# Patient Record
Sex: Female | Born: 1940 | Race: White | Hispanic: No | Smoking: Former smoker
Health system: Southern US, Community
[De-identification: ages and names within clinical notes are randomized; demographics above are authoritative.]

## PROBLEM LIST (undated history)

## (undated) DIAGNOSIS — M2540 Effusion, unspecified joint: Secondary | ICD-10-CM

## (undated) DIAGNOSIS — Z860100 Personal history of colon polyps, unspecified: Secondary | ICD-10-CM

## (undated) DIAGNOSIS — K219 Gastro-esophageal reflux disease without esophagitis: Secondary | ICD-10-CM

## (undated) DIAGNOSIS — E7800 Pure hypercholesterolemia, unspecified: Secondary | ICD-10-CM

## (undated) DIAGNOSIS — Z8669 Personal history of other diseases of the nervous system and sense organs: Secondary | ICD-10-CM

## (undated) DIAGNOSIS — E119 Type 2 diabetes mellitus without complications: Secondary | ICD-10-CM

## (undated) DIAGNOSIS — J189 Pneumonia, unspecified organism: Secondary | ICD-10-CM

## (undated) DIAGNOSIS — I10 Essential (primary) hypertension: Secondary | ICD-10-CM

## (undated) DIAGNOSIS — H269 Unspecified cataract: Secondary | ICD-10-CM

## (undated) DIAGNOSIS — M549 Dorsalgia, unspecified: Secondary | ICD-10-CM

## (undated) DIAGNOSIS — M2550 Pain in unspecified joint: Secondary | ICD-10-CM

## (undated) DIAGNOSIS — M1710 Unilateral primary osteoarthritis, unspecified knee: Secondary | ICD-10-CM

## (undated) DIAGNOSIS — K589 Irritable bowel syndrome without diarrhea: Secondary | ICD-10-CM

## (undated) HISTORY — PX: BREAST EXCISIONAL BIOPSY: SUR124

## (undated) HISTORY — PX: ABDOMINAL HYSTERECTOMY: SHX81

## (undated) HISTORY — DX: Irritable bowel syndrome without diarrhea: K58.9

## (undated) HISTORY — PX: RECTAL SURGERY: SHX760

## (undated) HISTORY — DX: Unilateral primary osteoarthritis, unspecified knee: M17.10

## (undated) HISTORY — PX: REPLACEMENT TOTAL KNEE BILATERAL: SUR1225

## (undated) HISTORY — DX: Gastro-esophageal reflux disease without esophagitis: K21.9

---

## 2000-11-25 ENCOUNTER — Encounter (INDEPENDENT_AMBULATORY_CARE_PROVIDER_SITE_OTHER): Payer: Self-pay | Admitting: Specialist

## 2000-11-25 ENCOUNTER — Ambulatory Visit (HOSPITAL_COMMUNITY): Admission: RE | Admit: 2000-11-25 | Discharge: 2000-11-25 | Payer: Self-pay | Admitting: Gastroenterology

## 2002-11-16 ENCOUNTER — Other Ambulatory Visit: Admission: RE | Admit: 2002-11-16 | Discharge: 2002-11-16 | Payer: Self-pay | Admitting: Family Medicine

## 2003-03-21 ENCOUNTER — Encounter: Admission: RE | Admit: 2003-03-21 | Discharge: 2003-03-21 | Payer: Self-pay | Admitting: Gastroenterology

## 2003-03-21 ENCOUNTER — Encounter: Payer: Self-pay | Admitting: Gastroenterology

## 2003-05-30 ENCOUNTER — Encounter: Payer: Self-pay | Admitting: Family Medicine

## 2003-05-30 ENCOUNTER — Encounter: Admission: RE | Admit: 2003-05-30 | Discharge: 2003-05-30 | Payer: Self-pay | Admitting: Family Medicine

## 2003-06-07 ENCOUNTER — Encounter: Payer: Self-pay | Admitting: Family Medicine

## 2003-06-07 ENCOUNTER — Encounter: Admission: RE | Admit: 2003-06-07 | Discharge: 2003-06-07 | Payer: Self-pay | Admitting: Family Medicine

## 2003-12-19 ENCOUNTER — Encounter: Admission: RE | Admit: 2003-12-19 | Discharge: 2003-12-19 | Payer: Self-pay | Admitting: Family Medicine

## 2004-02-13 ENCOUNTER — Encounter (INDEPENDENT_AMBULATORY_CARE_PROVIDER_SITE_OTHER): Payer: Self-pay | Admitting: *Deleted

## 2004-02-13 ENCOUNTER — Ambulatory Visit (HOSPITAL_COMMUNITY): Admission: RE | Admit: 2004-02-13 | Discharge: 2004-02-13 | Payer: Self-pay | Admitting: Gastroenterology

## 2004-06-25 ENCOUNTER — Encounter: Admission: RE | Admit: 2004-06-25 | Discharge: 2004-06-25 | Payer: Self-pay | Admitting: Family Medicine

## 2004-10-19 ENCOUNTER — Emergency Department (HOSPITAL_COMMUNITY): Admission: EM | Admit: 2004-10-19 | Discharge: 2004-10-20 | Payer: Self-pay | Admitting: Emergency Medicine

## 2005-09-14 ENCOUNTER — Encounter: Admission: RE | Admit: 2005-09-14 | Discharge: 2005-09-14 | Payer: Self-pay | Admitting: Orthopaedic Surgery

## 2005-11-02 ENCOUNTER — Ambulatory Visit (HOSPITAL_BASED_OUTPATIENT_CLINIC_OR_DEPARTMENT_OTHER): Admission: RE | Admit: 2005-11-02 | Discharge: 2005-11-02 | Payer: Self-pay | Admitting: Orthopaedic Surgery

## 2006-02-04 ENCOUNTER — Encounter: Admission: RE | Admit: 2006-02-04 | Discharge: 2006-02-04 | Payer: Self-pay | Admitting: Family Medicine

## 2007-01-13 ENCOUNTER — Encounter: Admission: RE | Admit: 2007-01-13 | Discharge: 2007-01-13 | Payer: Self-pay | Admitting: Gastroenterology

## 2007-02-09 ENCOUNTER — Encounter: Admission: RE | Admit: 2007-02-09 | Discharge: 2007-02-09 | Payer: Self-pay | Admitting: Family Medicine

## 2008-02-13 ENCOUNTER — Inpatient Hospital Stay (HOSPITAL_COMMUNITY): Admission: RE | Admit: 2008-02-13 | Discharge: 2008-02-16 | Payer: Self-pay | Admitting: Orthopaedic Surgery

## 2008-04-23 ENCOUNTER — Ambulatory Visit (HOSPITAL_COMMUNITY): Admission: RE | Admit: 2008-04-23 | Discharge: 2008-04-23 | Payer: Self-pay | Admitting: General Surgery

## 2008-08-22 ENCOUNTER — Inpatient Hospital Stay (HOSPITAL_COMMUNITY): Admission: RE | Admit: 2008-08-22 | Discharge: 2008-08-25 | Payer: Self-pay | Admitting: Orthopaedic Surgery

## 2008-09-24 ENCOUNTER — Ambulatory Visit (HOSPITAL_COMMUNITY): Admission: RE | Admit: 2008-09-24 | Discharge: 2008-09-24 | Payer: Self-pay | Admitting: General Surgery

## 2008-09-24 ENCOUNTER — Encounter (INDEPENDENT_AMBULATORY_CARE_PROVIDER_SITE_OTHER): Payer: Self-pay | Admitting: General Surgery

## 2009-02-12 ENCOUNTER — Encounter: Admission: RE | Admit: 2009-02-12 | Discharge: 2009-02-12 | Payer: Self-pay | Admitting: Family Medicine

## 2010-02-16 ENCOUNTER — Encounter: Admission: RE | Admit: 2010-02-16 | Discharge: 2010-02-16 | Payer: Self-pay | Admitting: Family Medicine

## 2010-10-31 ENCOUNTER — Other Ambulatory Visit: Payer: Self-pay | Admitting: Family Medicine

## 2010-10-31 DIAGNOSIS — Z1231 Encounter for screening mammogram for malignant neoplasm of breast: Secondary | ICD-10-CM

## 2010-10-31 DIAGNOSIS — Z0000 Encounter for general adult medical examination without abnormal findings: Secondary | ICD-10-CM

## 2010-11-02 ENCOUNTER — Encounter: Payer: Self-pay | Admitting: Family Medicine

## 2010-12-07 DIAGNOSIS — K649 Unspecified hemorrhoids: Secondary | ICD-10-CM | POA: Insufficient documentation
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
## 2011-02-23 NOTE — Op Note (Signed)
Andrea Farmer, Andrea Farmer                  ACCOUNT NO.:  [id number]   MEDICAL RECORD NO.:  [id number]          PATIENT TYPE:  INP   LOCATION:  2899                         FACILITY:  [facility]   PHYSICIAN:  Lubertha Basque. Dalldorf, M.D.DATE OF BIRTH:  [birth year]   DATE OF PROCEDURE:  02/13/2008  DATE OF DISCHARGE:                               OPERATIVE REPORT   PREOPERATIVE DIAGNOSES:  Right knee degenerative joint disease.   POSTOPERATIVE DIAGNOSES:  Right knee degenerative joint disease.   PROCEDURE:  Right total knee replacement.   ANESTHESIA:  General and block.   ATTENDING SURGEON:  Lubertha Basque. Jerl Santos, MD   ASSISTANT:  Lindwood Qua, PA   INDICATIONS FOR PROCEDURE:  The patient is a 70 year old woman with a  long history of a painful right knee.  This has persisted despite oral  antiinflammatories and injections.  She has bone-on-bone contact on the  x-rays and the pain, which limits her ability to walk and rest.  She is  offered a knee replacement operation.  Informed operative consent was  obtained after discussion of possible complications of reaction to  anesthesia, infection, DVT, PE, and death.   SUMMARY OF FINDINGS AND PROCEDURE:  Under general anesthesia and a block  through standard longitudinal anterior approach, a right knee  replacement was performed.  She had excellent bone quality.  She had  advanced degenerative change medial and patellofemoral and the mild  varus deformity.  We addressed her problem with a cemented DePuy LPS  system using standard plus femur, 4 tibia, 12.5-mm deep dish spacer, and  38-mm all-polyethylene patella.  I did include antibiotic in the cement.  Lindwood Qua assisted throughout and was invaluable to the  completion of the case in that he helped position, retract while I  performed procedure.  We also closed simultaneously to help minimize OR  time.   DESCRIPTION OF PROCEDURE:  The patient was taken to operating suite  where general  anesthetic was applied without difficulty.  She is also  given a block in the preanesthesia area.  She was positioned supine and  prepped and draped in normal sterile fashion.  After administration of  IV Kefzol, the right leg was elevated, exsanguinated, and tourniquet  inflated about thigh.  A longitudinal incision was made with dissection  down to the extensor mechanism.  All appropriate anti-infected measures  were used including closed hooded exhaust systems for each member of the  surgical team, preoperative IV antibiotic, and Betadine impregnated  drape.  A medial parapatellar incision was made.  The kneecap was  flipped and the knee flexed.  Residual meniscal tissues were removed  along with the ACL and PCL.  An extramedullary guide was placed in the  tibia to make a cut with a slight posterior tilt.  An intramedullary  guide was then placed in the femur to make anterior and posterior cuts  creating a flexion gap of 12.5 mm.  A second intramedullary guide was  placed in the femur to make a distal cut creating an equal extension gap  of 12.5 mm.  The femur sized  to a standard plus tibia to 4 and  appropriate guides were placed and utilized.  We cutdown the patella and  thickness by about 8 mm to 15 and then size to a 38 with the appropriate  guide placed and utilized.  A trial reduction was performed and she  easily came to full extension and flexed well.  The patella tracked  well.  The trial components removed followed by pulsatile lavage  irrigation of all 3 cut bony surfaces.  Cement was mixed including the  antibiotic and was pressurized onto all 3 cut bony surfaces.  The  aforementioned DePuy LPS components were then placed appropriately.  Excess cement was trimmed and pressure was applied on the component  until the cement had hardened.  The tourniquet was deflated and a small  amount of bleeding was easily controlled with Bovie cautery.  The wound  was again irrigated  followed by placement of drain exiting  superolaterally.  The extensor mechanism was reapproximated #1 Vicryl in  an interrupted fashion followed by subcutaneous reapproximation with 0  and 2-0 undyed Vicryl and skin closure with staples.  Adaptic was  applied followed by dry gauze and the loose Ace wrap.  Estimated blood  loss and fluids as well as accurate tourniquet time be obtained from  anesthesia records.   DISPOSITION:  The patient was extubated in the operating room and taken  to recovery in stable condition.  She is admitted back to the orthopedic  surgery service for appropriate postop care to include perioperative  antibiotics and Coumadin plus Lovenox for DVT prophylaxis.      Lubertha Basque Jerl Santos, M.D.  Electronically Signed     PGD/MEDQ  D:  02/13/2008  T:  02/14/2008  Job:  [id number]

## 2011-02-23 NOTE — Op Note (Signed)
Andrea Farmer, Andrea Farmer                  ACCOUNT NO.:  [id number]   MEDICAL RECORD NO.:  [id number]          PATIENT TYPE:  INP   LOCATION:  2550                         FACILITY:  [facility]   PHYSICIAN:  Andrea Farmer. Dalldorf, M.D.DATE OF BIRTH:  01/14/1941   DATE OF PROCEDURE:  08/22/2008  DATE OF DISCHARGE:                               OPERATIVE REPORT   PREOPERATIVE DIAGNOSIS:  Left knee degenerative arthritis.   POSTOPERATIVE DIAGNOSIS:  Left knee degenerative arthritis.   PROCEDURE:  Left total knee replacement.   ANESTHESIA:  General and block.   ATTENDING SURGEON:  Andrea Farmer. Andrea Santos, MD   ASSISTANT:  Lindwood Qua, PA   INDICATIONS FOR PROCEDURE:  The patient is a 70 year old woman with a  long history of bilateral knee pain.  This has persistented despite oral  anti-inflammatories and injections of cortisone and viscosupplementative  agents.  She has had pain, which limits her ability to walk and rest.  She is status post a successful knee replacement on the right earlier  this year and is offered the same thing on the left due continued  disabling pain.  Informed operative consent was obtained after  discussion of possible complications including reaction to anesthesia,  infection, DVT, PE, and death.  The importance of the postoperative  rehabilitation protocol to optimize result was also discussed with the  patient.   SUMMARY FINDINGS PROCEDURE:  Under general anesthesia and a block, a  left knee replacement was performed.  She had advanced degenerative  changes in all three compartments with excellent bone quality.  I  addressed her problem with a cemented DePuy LCS system using a standard  plus femur, 4 tibia, 10 deep dish spacer, 38-mm all-polyethylene  patella.  Andrea Farmer assisted throughout and was invaluable to the  completion of the case in that he helped position and retract while I  performed the procedure.  He also closed simultaneously to help minimize  OR  time.   DESCRIPTION OF THE PROCEDURE:  The patient was taken to the operating  suite where general anesthetic was applied without difficulty.  She also  was given a block in the preanesthesia area.  She was positioned supine  and prepped and draped in normal sterile fashion.  After administration  of IV Kefzol, the left leg was elevated, exsanguinated, and tourniquet  inflated about the thigh.  A medial incision was made with dissection  down to the extensor mechanism.  All appropriate anti-infected measures  were used including closed hooded exhaust systems for each member of  surgical team, preoperative IV antibiotic, and Betadine impregnated  drape.  A medial parapatellar incision was made.  The kneecap was  flipped and the knee flexed.  Findings were as noted above.  Residual  meniscal tissues were removed along with the ACL and PCL.  She had  marked deformities of the femur medial and lateral.  An extramedullary  guide was placed in the tibia to make a cut with a slight posterior  tilt.  An intramedullary guide was then placed in the femur to make  anterior and posterior cuts  creating a flexion gap of 12.5 mm.  A second  intramedullary guide was placed in the femur to make a distal cut  creating equal extension gap of 12.5 mm balancing the knee.  The femur  sized to a standard plus and the tibia to a 4.  The appropriate guides  were placed and utilized.  The patella was cut down thickness by 10 mm  to 15 and sized to 38 with appropriate guides were placed and utilized.  We performed a trial reduction of the aforementioned components.  She  easily came to full extension with a slight hyperextension and flexed  well.  We elected to downsize from the 12.5 spacer to a 10 from the  opposite knee.  She had some significant postoperative stiffness.  We  then performed a trial reduction with the 10 spacer and she really felt  about the same.  The trial components were removed followed by  total  pulsatile lavage irrigation of all three bones.  Cement was mixed  including Zinacef and was pressurized onto the bones followed by  placement of the aforementioned components.  Excess cement was trimmed  and pressure was held in component until cement had hardened.  The  tourniquet was deflated and a small amount of bleeding was easily  controlled by Bovie cautery.  A drain was placed exiting superolaterally  followed by reapproximation of the tensor mechanism with #1 Vicryl  interrupted fashion.  Subcutaneous tissues were reapproximated with 0  and 2-0 undyed Vicryl followed by skin closure with staples.  Adaptic  was applied followed by dry gauze and loose Ace wrap.  Estimated blood  loss, intraoperative fluids, as well as accurate tourniquet time could  be obtained from anesthesia records.   DISPOSITION:  The patient was extubated in the operating room and taken  to the recovery room in stable addition.  She was readmitted to the  Orthopedic Surgery Service for appropriate postop care to include  perioperative antibiotics and Coumadin plus Lovenox for DVT prophylaxis.      Andrea Farmer Andrea Farmer, M.D.  Electronically Signed     PGD/MEDQ  D:  08/22/2008  T:  08/22/2008  Job:  [id number]

## 2011-02-24 ENCOUNTER — Ambulatory Visit
Admission: RE | Admit: 2011-02-24 | Discharge: 2011-02-24 | Disposition: A | Discharge status: Home / Self Care | Payer: Medicare Other | Source: Ambulatory Visit | Attending: Family Medicine | Admitting: Family Medicine

## 2011-02-24 DIAGNOSIS — Z1231 Encounter for screening mammogram for malignant neoplasm of breast: Secondary | ICD-10-CM
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
## 2011-02-26 NOTE — Op Note (Signed)
NAMEKAMIA, INSALACO                  ACCOUNT NO.:  [id number]   MEDICAL RECORD NO.:  [id number]          PATIENT TYPE:  AMB   LOCATION:  DSC                          FACILITY:  [facility]   PHYSICIAN:  Lubertha Basque. Dalldorf, M.D.DATE OF BIRTH:  1941-03-04   DATE OF PROCEDURE:  11/02/2005  DATE OF DISCHARGE:                                 OPERATIVE REPORT   PREOPERATIVE DIAGNOSES:  1.  Right knee degenerative joint disease.  2.  Right knee torn medial meniscus.   POSTOPERATIVE DIAGNOSES:  1.  Right knee degenerative joint disease.  2.  Right knee torn medial meniscus.   PROCEDURES:  1.  Right knee abrasion chondroplasty and loose body removal.  2.  Right knee partial medial meniscectomy.   ANESTHESIA:  Knee block, MAC.   ATTENDING SURGEON:  Lubertha Basque. Jerl Santos, M.D.   ASSISTANT:  Lindwood Qua, P.A.   INDICATION FOR PROCEDURE:  The patient is a 70 year old woman with a long  history of right knee pain.  This has persisted despite oral anti-  inflammatories including the Dosepak as well as an injection of cortisone,  which just helped her for short period of time.  She has undergone an MRI  scan, which showed a medial meniscus tear and some significant degenerative  changes.  By plain film she has moderate degenerative changes, while the  opposite knee exhibits end-stage degeneration but is asymptomatic.  She is  offered an operative arthroscopy as she continues with pain at rest and pain  with activities.  She understands this may be a temporizing procedure  leading up to a total knee at some point in the future.  Informed operative  consent was obtained after discussion of the possible complications of,  reaction to anesthesia and infection.   DESCRIPTION OF PROCEDURE:  The patient was taken to an operating suite,  where knee block and MAC were applied.  She was positioned supine and  prepped and draped in normal sterile fashion.  After the administration of  preop IV antibiotic,  which was Kefzol, an arthroscopy of the right knee was  performed through two inferior portals.  The suprapatellar pouch was benign  except for an abundance of small cartilaginous loose bodies and one which  measured about a centimeter in diameter, which was removed.  The  patellofemoral joint exhibited some grade 3 change, addressed with a  chondroplasty, but the patella tracked well.  The medial compartment  exhibited grade 4 change, both femoral and tibial.  She did have a middle  and posterior horn meniscus tear, addressed with about a 10% partial medial  meniscectomy.  The ACL appeared intact.  The lateral compartment exhibited  no evidence of meniscal articular cartilage injury.  A thorough  chondroplasty was done of loose flaps of articular cartilage in the medial  compartment and patellofemoral, and abrasion was taken to bleeding bone in  one small area in the patellofemoral joint, but this was not done in the  medial compartment, as the areas of change were so broad.  The knee was  thoroughly irrigated at the end of  the case, followed by placement of Depo-  Medrol, Marcaine and morphine.  Adaptic was placed over the portals,  followed by dry gauze and loose Ace wrap.  Estimated blood loss and fluids  can be obtained from anesthesia records.   DISPOSITION:  The patient was taken to the recovery room in stable addition.  Plans were for her to go home the same day and to follow up in the office in  less than a week.  I will contact her by phone tonight.      Lubertha Basque Jerl Santos, M.D.  Electronically Signed     PGD/MEDQ  D:  11/02/2005  T:  11/02/2005  Job:  [id number]

## 2011-02-26 NOTE — Discharge Summary (Signed)
NAMESUSIE, EHRESMAN                  ACCOUNT NO.:  [id number]   MEDICAL RECORD NO.:  [id number]          PATIENT TYPE:  INP   LOCATION:  5025                         FACILITY:  [facility]   PHYSICIAN:  Lubertha Basque. Dalldorf, M.D.DATE OF BIRTH:  Sep 25, 1941   DATE OF ADMISSION:  08/22/2008  DATE OF DISCHARGE:  08/25/2008                               DISCHARGE SUMMARY   ADMITTING DIAGNOSES:  1. Left knee end-stage degenerative joint disease.  2. History of hypertension.  3. History of hypercholesterolemia.   DISCHARGE DIAGNOSES:  1. Left knee end-stage degenerative joint disease.  2. History of hypertension.  3. History of hypercholesterolemia.   OPERATIONS:  Left knee replacement.   BRIEF HISTORY:  Ms. Andrea Farmer is a 70 year old white female patient well-  known to our practice who has had increasing left knee pain and has  failed oral anti-inflammatory medicines and corticosteroid injections.  Her x-rays reveal end-stage DJD.   PERTINENT LABORATORY DATA AND X-RAY FINDINGS:  EKG, normal sinus rhythm.  WBCs 9.0, RBCs 2.99, hemoglobin 9.5, and platelets 215.  INR last  testing 1.6.  Sodium 134, potassium 3.2, glucose 157, BUN 3, and  creatinine 0.68.   HOSPITAL COURSE:  She was admitted postoperatively and started PCA pump  for pain, stool softeners, and enema p.r.n.  Foley catheter for the  first 1-2 days and discontinued.  IV Ancef 1 g q.8 h. x3 doses and then  along with knee-high TEDs, incentive spirometry, and then activity which  was for physical therapy, up and out of bed, weightbearing as tolerated.  Also the use of CPM machine was incorporated.  She progressed well the  first day postop.  Her vital signs were stable, blood pressure 128/71  and temperature 98.  Lungs are clear to A and P.  Abdomen soft.  CPM  machine and Foley were being used and her left knee showed no sign of  infection or irritation.  Dressing was pulled and changed on second day  postop and Foley catheter  discontinued.  There were no signs of  infection or irritation.  She progressed well and was discharged home.   CONDITION ON DISCHARGE:  Improved.   FOLLOWUP:  She will remain on a low-sodium, heart-healthy diet.  The  weightbearing as tolerated with help of crutches or walker, to change  her dressing daily.  Return to Dr. Nolon Nations office in 10 days, calling  [phone], or  sooner if there is any sign of infection or problems.  She was given a  prescription for Vicodin 1-2 every 4-6 hours and then Coumadin for 2  weeks, dose regulated by pharmacy, Reglan 10 mg every 8 hours as needed  for nausea and vomiting.  She had a home care agency, they will come out  and provide serial INRs and home physical therapy.      Lindwood Qua, P.A.      Lubertha Basque Jerl Santos, M.D.  Electronically Signed    MC/MEDQ  D:  10/15/2008  T:  10/15/2008  Job:  [id number]

## 2011-02-26 NOTE — Procedures (Signed)
[facility]  Patient:    Andrea Farmer, Andrea Farmer                         MRN: [id number] Proc. Date: 11/25/00 Adm. Date:  [id number] Attending:  Rich Brave CC:         Lindwood Qua, M.D, [place], [place]   Procedure Report  PROCEDURE:  Colonoscopy with biopsies.  ENDOSCOPIST:  Florencia Reasons, M.D.  INDICATIONS:  A 70 year old female for screening for colon cancer and with history of intermittent diarrhea suggestive of IBS.  FINDINGS:  Normal exam to the cecum except for sever diminutive polyps removed by cold biopsy technique.  DESCRIPTION OF PROCEDURE:  The nature, purpose, and risks of the procedure had been discussed with the patient who provided written consent.  Sedation was fentanyl 100 mcg and Versed 10 mg IV without arrhythmias or desaturation. The Olympus adjustable tension video colonoscope was able to be advanced to the cecum, and pullback was then performed.  The ileocecal valve was identified for confirmation of cecal location.  We turned the patient into the supine position, right lateral decubitus position, and then back to supine and left lateral decubitus positions during the course of the exam.  There were three or four diminutive (1 to 3 mm) sessile polyps along the length of the colon which were removed by cold biopsy technique.  I also obtained random colonic mucosal biopsies along the length of the colon to help exclude microscopic colitis.  Retroflexion was not preformed in the rectum.  No large polyps, cancer, colitis, vascular malformations, or diverticulosis were noted.  The patient tolerated the procedure well, and there were no apparent complications.  IMPRESSION:  Several diminutive polyps, not clinical worrisome in appearance.  PLAN:  Await pathology on biopsy DD:  11/25/00 TD:  11/26/00 Job: [id number]

## 2011-02-26 NOTE — Op Note (Signed)
NAME:  Andrea Farmer, GLASPY                            ACCOUNT NO.:  [id number]   MEDICAL RECORD NO.:  [id number]                   PATIENT TYPE:  AMB   LOCATION:  ENDO                                 FACILITY:  [facility]   PHYSICIAN:  Bernette Redbird, M.D.                DATE OF BIRTH:  1941-09-09   DATE OF PROCEDURE:  02/13/2004  DATE OF DISCHARGE:                                 OPERATIVE REPORT   PROCEDURE:  Colonoscopy.   INDICATIONS:  Followup of colonic adenomas removed three years ago.   FINDINGS:  Several diminutive polyps, removed.   PROCEDURE:  The nature and purposes of the procedure have been previously  discussed with the patient, and she provided written consent.  Sedation with  fentanyl 100 mcg and Versed 10 mg IV prior to and during the course of the  procedure.  There was no problem with arrhythmias or desaturations.  The  Olympus adjustable-tension pediatric video colonoscope was advanced to the  terminal ileum with moderate difficulty due to looping.  Ultimately,  overcome by over-riding loops and turning the patient to the right lateral  decubitus position, the adult colonoscope might be a better choice on future  exams.  The terminal ileum had a normal appearance.  Pull-back was then  performed.  The quality of the prep was excellent, and it is felt that all  areas were well seen.   There were a small sessile polyp removed by cold biopsy near the hepatic  flexure and three small sessile hypoplastic-appearing polyps removed by cold  biopsy in the rectum.  Reinspection of the rectum was otherwise  unremarkable, but retroflexion could not be accomplished due to a small  rectal ampulla.  There were mild internal hemorrhoids seen during pull-out  through the anal canal.   No large polyps, cancer, colonic vascular malformations, or diverticulosis  were noted during this exam.   The patient tolerated the procedure well, and there were no apparent  complications.   IMPRESSION:  Multiple diminutive polyps, remove, as described above (211.3).   PLAN:  Await pathology results.                                               Bernette Redbird, M.D.    RB/MEDQ  D:  02/13/2004  T:  02/13/2004  Job:  [id number]   cc:   C. Duane Lope, M.D.  [address]  [place]  [place] [address]  Fax: [phone]

## 2011-02-26 NOTE — Discharge Summary (Signed)
[name], Farmer                  ACCOUNT NO.:  [id number]   MEDICAL RECORD NO.:  [id number]          PATIENT TYPE:  INP   LOCATION:  5009                         FACILITY:  [facility]   PHYSICIAN:  Lubertha Basque. Dalldorf, M.D.DATE OF BIRTH:  [birth year]   DATE OF ADMISSION:  02/13/2008  DATE OF DISCHARGE:  02/16/2008                               DISCHARGE SUMMARY   ADMISSION DIAGNOSES:  1. Right knee end-stage degenerative joint disease.  2. Hypertension.   DISCHARGE DIAGNOSES:  1. Right knee end-stage degenerative joint disease.  2. Hypertension.   OPERATION:  Total knee replacement right side.   BRIEF HISTORY:  Andrea Farmer is a patient well known to our practice who  is having increasing right knee pain now.  She is having pain with every  step, trouble sleeping at night time.  She has failed a knee scope on  November 02, 2005, which at that time showed significant underlying  degenerative changes.  Also has failed intra-articular corticosteroid  injection of Synvisc injections.  X-rays reveal end-stage DJD.   PERTINENT LABORATORY AND X-RAY FINDINGS:  Hemoglobin 11.5, hematocrit  33.9, WBC is 9.3, platelets of 247.  Serial INRs were done.  She was on  low-dose Coumadin protocol by pharmacy.  Sodium 138, potassium 3.7,  glucose 134, BUN 6, and creatinine 0.66.  EKG, normal sinus rhythm.   COURSE IN THE HOSPITAL:  She was admitted postoperatively and was placed  on a PCA Dilaudid pump and IV of lactated Ringer's, IV Ancef 1 gram q. 8  hours x3 doses, Coumadin and Lovenox DVT prophylaxis per pharmacy, and  then iron supplementation, oral pain medicines, and antiemetics.  We  also incorporated knee high TEDs, incentive spirometry, CPM machine, and  then out of bed with physical therapy, to be weightbearing as tolerated.  The first day postop, she was doing well, had moderate discomfort, her  vital signs were blood pressure 104/74 and temperature 97, hemoglobin  11.8, potassium 3.7.  Foley  catheter was in.  Lungs were clear.  Abdomen  soft.  Hemovac was working.  She had a CPM machine and was moving her  leg.  Physical therapy get out off bed and to be weightbearing as  tolerated and she progressed well.  The second day postop, we removed  the dressing, pulled the drain.  Wound was noted to be benign with no  sign of infection.  Range of motion was at least 0-65 degrees.  PCA pump  was discontinued as well as her Foley catheter.  The next day, she was  doing well and was able to be discharged home.   CONDITION ON DISCHARGE:  Improved.   DISCHARGE INSTRUCTIONS:  She will eat a low-sodium, heart-healthy diet,  change her dressing daily, use of a home CPM machine through [facility] was ordered with home physical therapy and blood draws for  INRs.  She could be weightbearing as tolerated.  Any sign of infection,  to call our office at [phone], which would include increasing redness,  drainage, increasing pain, or swelling.  Percocet 1 or 2  every 4-6 hours  for pain.  Coumadin dose regulated by pharmacy.  Then also on her home medications,  which were simvastatin 80 mg 1 a day, amlodipine, benazepril 5/20 one a  day, HCTZ 25 mg 1 a day, and then we will see her back in the office in  about 10 days from our discharge hospital day.      Lindwood Qua, P.A.      Lubertha Basque Jerl Santos, M.D.  Electronically Signed    MC/MEDQ  D:  03/05/2008  T:  03/06/2008  Job:  [id number]

## 2011-03-03 ENCOUNTER — Other Ambulatory Visit: Payer: Self-pay | Admitting: Family Medicine

## 2011-03-03 DIAGNOSIS — Z1231 Encounter for screening mammogram for malignant neoplasm of breast: Secondary | ICD-10-CM

## 2011-07-08 LAB — HEMOGLOBIN AND HEMATOCRIT, BLOOD: HCT: 40.2

## 2011-07-08 LAB — BASIC METABOLIC PANEL
CO2: 32
Chloride: 101
GFR calc Af Amer: >60
GFR calc non Af Amer: >60
Glucose, Bld: 121 — ABNORMAL HIGH
Sodium: 139

## 2011-07-13 LAB — CBC
HCT: 28.1 — ABNORMAL LOW
HCT: 30.7 — ABNORMAL LOW
Hemoglobin: 10.1 — ABNORMAL LOW
Hemoglobin: 10.4 — ABNORMAL LOW
Hemoglobin: 13.4
Hemoglobin: 9.5 — ABNORMAL LOW
MCHC: 33.7
MCV: 93.4
MCV: 94
Platelets: 215
Platelets: 238
RBC: 2.99 — ABNORMAL LOW
RBC: 3.17 — ABNORMAL LOW
RBC: 4.24
RDW: 13.2
RDW: 13.6
WBC: 10.9 — ABNORMAL HIGH
WBC: 8.5

## 2011-07-13 LAB — BASIC METABOLIC PANEL
BUN: 3 — ABNORMAL LOW
BUN: 4 — ABNORMAL LOW
BUN: 5 — ABNORMAL LOW
CO2: 27
CO2: 29
Calcium: 8.8
Calcium: 9.9
Chloride: 101
Chloride: 104
Chloride: 97
Creatinine, Ser: 0.51
Creatinine, Ser: 0.59
Creatinine, Ser: 0.61
GFR calc Af Amer: >60
GFR calc Af Amer: >60
GFR calc non Af Amer: >60
GFR calc non Af Amer: >60
Glucose, Bld: 125 — ABNORMAL HIGH
Glucose, Bld: 157 — ABNORMAL HIGH
Glucose, Bld: 83
Potassium: 3.2 — ABNORMAL LOW
Potassium: 4
Sodium: 134 — ABNORMAL LOW
Sodium: 137

## 2011-07-13 LAB — PROTIME-INR
Prothrombin Time: 13
Prothrombin Time: 18.9 — ABNORMAL HIGH
Prothrombin Time: 19.9 — ABNORMAL HIGH

## 2011-07-16 LAB — CBC
MCV: 93.2 fL (ref 78.0–100.0)
Platelets: 315 10*3/uL (ref 150–400)
RBC: 4.14 MIL/uL (ref 3.87–5.11)
WBC: 8.3 10*3/uL (ref 4.0–10.5)

## 2011-07-16 LAB — BASIC METABOLIC PANEL
BUN: 11 mg/dL (ref 6–23)
Calcium: 10 mg/dL (ref 8.4–10.5)
Creatinine, Ser: 0.49 mg/dL (ref 0.4–1.2)
GFR calc Af Amer: >60 mL/min (ref >60)
Sodium: 142 mEq/L (ref 135–145)

## 2012-02-25 ENCOUNTER — Ambulatory Visit
Admission: RE | Admit: 2012-02-25 | Discharge: 2012-02-25 | Disposition: A | Discharge status: Home / Self Care | Payer: Medicare Other | Source: Ambulatory Visit | Attending: Family Medicine | Admitting: Family Medicine

## 2012-02-25 DIAGNOSIS — Z1231 Encounter for screening mammogram for malignant neoplasm of breast: Secondary | ICD-10-CM

## 2012-03-07 ENCOUNTER — Other Ambulatory Visit: Payer: Self-pay | Admitting: Obstetrics and Gynecology

## 2012-03-07 DIAGNOSIS — Z1231 Encounter for screening mammogram for malignant neoplasm of breast: Secondary | ICD-10-CM

## 2012-03-28 ENCOUNTER — Other Ambulatory Visit: Payer: Self-pay | Admitting: Family Medicine

## 2012-11-03 ENCOUNTER — Other Ambulatory Visit: Payer: Self-pay | Admitting: Gastroenterology

## 2013-02-26 ENCOUNTER — Ambulatory Visit: Payer: Medicare Other

## 2013-03-18 ENCOUNTER — Encounter (HOSPITAL_COMMUNITY): Payer: Self-pay | Admitting: *Deleted

## 2013-03-18 ENCOUNTER — Emergency Department (HOSPITAL_COMMUNITY)
Admission: EM | Admit: 2013-03-18 | Discharge: 2013-03-18 | Disposition: A | Discharge status: Home / Self Care | Payer: Medicare Other | Attending: Emergency Medicine | Admitting: Emergency Medicine

## 2013-03-18 DIAGNOSIS — E119 Type 2 diabetes mellitus without complications: Secondary | ICD-10-CM | POA: Insufficient documentation

## 2013-03-18 DIAGNOSIS — R109 Unspecified abdominal pain: Secondary | ICD-10-CM

## 2013-03-18 DIAGNOSIS — Z79899 Other long term (current) drug therapy: Secondary | ICD-10-CM | POA: Insufficient documentation

## 2013-03-18 DIAGNOSIS — I10 Essential (primary) hypertension: Secondary | ICD-10-CM | POA: Insufficient documentation

## 2013-03-18 DIAGNOSIS — E7800 Pure hypercholesterolemia, unspecified: Secondary | ICD-10-CM | POA: Insufficient documentation

## 2013-03-18 DIAGNOSIS — R1011 Right upper quadrant pain: Secondary | ICD-10-CM | POA: Insufficient documentation

## 2013-03-18 HISTORY — DX: Pure hypercholesterolemia, unspecified: E78.00

## 2013-03-18 HISTORY — DX: Essential (primary) hypertension: I10

## 2013-03-18 LAB — URINALYSIS, ROUTINE W REFLEX MICROSCOPIC
Bilirubin Urine: NEGATIVE
Glucose, UA: 500 mg/dL — AB
Hgb urine dipstick: NEGATIVE
Specific Gravity, Urine: 1.02 (ref 1.005–1.030)

## 2013-03-18 LAB — POCT I-STAT TROPONIN I: Troponin i, poc: 0 ng/mL (ref 0.00–0.08)

## 2013-03-18 LAB — COMPREHENSIVE METABOLIC PANEL
Albumin: 4.2 g/dL (ref 3.5–5.2)
Alkaline Phosphatase: 79 U/L (ref 39–117)
BUN: 17 mg/dL (ref 6–23)
CO2: 26 mEq/L (ref 19–32)
Chloride: 99 mEq/L (ref 96–112)
Creatinine, Ser: 0.49 mg/dL — ABNORMAL LOW (ref 0.50–1.10)
GFR calc non Af Amer: >90 mL/min (ref >90)
Potassium: 3.3 mEq/L — ABNORMAL LOW (ref 3.5–5.1)
Total Bilirubin: 0.2 mg/dL — ABNORMAL LOW (ref 0.3–1.2)

## 2013-03-18 LAB — CBC
HCT: 39.2 % (ref 36.0–46.0)
Hemoglobin: 13.5 g/dL (ref 12.0–15.0)
MCV: 89.5 fL (ref 78.0–100.0)
RBC: 4.38 MIL/uL (ref 3.87–5.11)
RDW: 13 % (ref 11.5–15.5)
WBC: 11.2 10*3/uL — ABNORMAL HIGH (ref 4.0–10.5)

## 2013-03-18 LAB — LIPASE, BLOOD: Lipase: 33 U/L (ref 11–59)

## 2013-03-18 MED ORDER — METFORMIN HCL 500 MG PO TABS: 500.0000 mg | ORAL_TABLET | Freq: Every day | ORAL | Status: DC

## 2013-03-18 NOTE — ED Notes (Signed)
C/o right sided abdominal pain that started about 1 hour ago

## 2013-03-18 NOTE — ED Notes (Signed)
Dr. Radford Pax at bedside for bedside ultrasound

## 2013-03-18 NOTE — ED Provider Notes (Signed)
History     CSN: [id number]  Arrival date & time 03/18/13  2029   First MD Initiated Contact with Patient 03/18/13 2052      Chief Complaint  Patient presents with  . Abdominal Pain     HPI Patient presents with right upper quadrant abdominal pain that started approximately one hour prior to arrival.  Patient feels better.  Did have excess gas.  Pain has occurred before but is worse than normal. Patient denies fever chills dysuria or other abdominal complaints.  Patient has no known history of gallbladder disease.  Past Medical History  Diagnosis Date  . Hypertension   . High cholesterol     Past Surgical History  Procedure Laterality Date  . Replacement total knee bilateral      No family history on file.  History  Substance Use Topics  . Smoking status: Never Smoker   . Smokeless tobacco: Not on file  . Alcohol Use: Yes    OB History   Grav Para Term Preterm Abortions TAB SAB Ect Mult Living                  Review of Systems All other systems reviewed and are negative Allergies  Review of patient's allergies indicates no known allergies.  Home Medications   Current Outpatient Rx  Name  Route  Sig  Dispense  Refill  . amLODipine-benazepril (LOTREL) 10-40 MG per capsule   Oral   Take 1 capsule by mouth daily.         . hydrochlorothiazide (HYDRODIURIL) 25 MG tablet   Oral   Take 25 mg by mouth daily.         . hyoscyamine (LEVSIN SL) 0.125 MG SL tablet   Sublingual   Place 0.125 mg under the tongue as needed (before meals(dissolve on tongue)).         . simvastatin (ZOCOR) 20 MG tablet   Oral   Take 20 mg by mouth daily.           BP 155/79  Pulse 75  Temp(Src) 98.1 F (36.7 C) (Oral)  Resp 16  SpO2 98%  Physical Exam  Nursing note and vitals reviewed. Constitutional: She is oriented to person, place, and time. She appears well-developed and well-nourished. No distress.  HENT:  Head: Normocephalic and atraumatic.  Eyes: Pupils  are equal, round, and reactive to light.  Neck: Normal range of motion.  Cardiovascular: Normal rate and intact distal pulses.   Pulmonary/Chest: No respiratory distress.  Abdominal: Normal appearance. She exhibits no distension. There is no tenderness. There is no rebound and no guarding.  Musculoskeletal: Normal range of motion.  Neurological: She is alert and oriented to person, place, and time. No cranial nerve deficit.  Skin: Skin is warm and dry. No rash noted.  Psychiatric: She has a normal mood and affect. Her behavior is normal.    ED Course  Procedures (including critical care time)  Date: 03/18/2013  Rate: 73  Rhythm: normal sinus rhythm  QRS Axis: normal  Intervals: normal  ST/T Wave abnormalities: Borderline intraventricular conduction delay  Conduction Disutrbances: none  Narrative Interpretation: Borderline EKG     Labs Reviewed  CBC - Abnormal; Notable for the following:    WBC 11.2 (*)    All other components within normal limits  COMPREHENSIVE METABOLIC PANEL - Abnormal; Notable for the following:    Potassium 3.3 (*)    Glucose, Bld 203 (*)    Creatinine, Ser 0.49 (*)  Total Bilirubin 0.2 (*)    All other components within normal limits  URINALYSIS, ROUTINE W REFLEX MICROSCOPIC - Abnormal; Notable for the following:    Glucose, UA 500 (*)    All other components within normal limits  LIPASE, BLOOD  HEMOGLOBIN A1C  POCT I-STAT TROPONIN I   No results found.   1. Abdominal pain   2. Diabetes       MDM  After treatment in the ED the patient feels back to baseline and wants to go home.  Patient will call her physician tomorrow.        Nelia Shi, MD 03/18/13 [phone]

## 2013-04-16 ENCOUNTER — Ambulatory Visit
Admission: RE | Admit: 2013-04-16 | Discharge: 2013-04-16 | Disposition: A | Discharge status: Home / Self Care | Payer: Medicare Other | Source: Ambulatory Visit | Attending: Obstetrics and Gynecology | Admitting: Obstetrics and Gynecology

## 2013-04-16 DIAGNOSIS — Z1231 Encounter for screening mammogram for malignant neoplasm of breast: Secondary | ICD-10-CM

## 2013-04-17 ENCOUNTER — Other Ambulatory Visit (HOSPITAL_COMMUNITY): Payer: Self-pay | Admitting: Obstetrics and Gynecology

## 2013-04-17 DIAGNOSIS — R1011 Right upper quadrant pain: Secondary | ICD-10-CM

## 2013-04-30 ENCOUNTER — Encounter (HOSPITAL_COMMUNITY)
Admission: RE | Admit: 2013-04-30 | Discharge: 2013-04-30 | Disposition: A | Discharge status: Home / Self Care | Payer: Medicare Other | Source: Ambulatory Visit | Attending: Obstetrics and Gynecology | Admitting: Obstetrics and Gynecology

## 2013-04-30 DIAGNOSIS — R1011 Right upper quadrant pain: Secondary | ICD-10-CM | POA: Insufficient documentation

## 2013-04-30 MED ORDER — TECHNETIUM TC 99M MEBROFENIN IV KIT
5.1000 | PACK | Freq: Once | INTRAVENOUS | Status: AC | PRN
  Administered 2013-04-30: 5 via INTRAVENOUS

## 2013-05-16 ENCOUNTER — Other Ambulatory Visit: Payer: Self-pay

## 2013-08-16 ENCOUNTER — Other Ambulatory Visit: Payer: Self-pay

## 2013-09-13 ENCOUNTER — Other Ambulatory Visit: Payer: Self-pay

## 2013-09-13 DIAGNOSIS — Z1231 Encounter for screening mammogram for malignant neoplasm of breast: Secondary | ICD-10-CM

## 2013-11-06 ENCOUNTER — Other Ambulatory Visit: Payer: Self-pay | Admitting: Obstetrics and Gynecology

## 2013-11-06 DIAGNOSIS — N644 Mastodynia: Secondary | ICD-10-CM

## 2013-11-12 ENCOUNTER — Ambulatory Visit
Admission: RE | Admit: 2013-11-12 | Discharge: 2013-11-12 | Disposition: A | Discharge status: Home / Self Care | Payer: Medicare Other | Source: Ambulatory Visit | Attending: Obstetrics and Gynecology | Admitting: Obstetrics and Gynecology

## 2013-11-12 DIAGNOSIS — N644 Mastodynia: Secondary | ICD-10-CM

## 2014-01-16 ENCOUNTER — Other Ambulatory Visit: Payer: Self-pay | Admitting: Dermatology

## 2014-04-17 ENCOUNTER — Ambulatory Visit
Admission: RE | Admit: 2014-04-17 | Discharge: 2014-04-17 | Disposition: A | Discharge status: Home / Self Care | Payer: Medicare Other | Source: Ambulatory Visit

## 2014-04-17 ENCOUNTER — Other Ambulatory Visit: Payer: Self-pay

## 2014-04-17 DIAGNOSIS — Z1231 Encounter for screening mammogram for malignant neoplasm of breast: Secondary | ICD-10-CM

## 2014-04-18 ENCOUNTER — Other Ambulatory Visit: Payer: Self-pay

## 2014-04-18 DIAGNOSIS — Z1231 Encounter for screening mammogram for malignant neoplasm of breast: Secondary | ICD-10-CM

## 2015-03-31 ENCOUNTER — Encounter (HOSPITAL_COMMUNITY): Payer: Self-pay | Admitting: Emergency Medicine

## 2015-03-31 ENCOUNTER — Emergency Department (HOSPITAL_COMMUNITY)
Admission: EM | Admit: 2015-03-31 | Discharge: 2015-03-31 | Disposition: A | Discharge status: Home / Self Care | Payer: PPO | Attending: Emergency Medicine | Admitting: Emergency Medicine

## 2015-03-31 DIAGNOSIS — M542 Cervicalgia: Secondary | ICD-10-CM | POA: Diagnosis present

## 2015-03-31 DIAGNOSIS — Z7982 Long term (current) use of aspirin: Secondary | ICD-10-CM | POA: Diagnosis not present

## 2015-03-31 DIAGNOSIS — Z79899 Other long term (current) drug therapy: Secondary | ICD-10-CM | POA: Insufficient documentation

## 2015-03-31 DIAGNOSIS — I10 Essential (primary) hypertension: Secondary | ICD-10-CM | POA: Diagnosis not present

## 2015-03-31 DIAGNOSIS — M5412 Radiculopathy, cervical region: Secondary | ICD-10-CM | POA: Diagnosis not present

## 2015-03-31 DIAGNOSIS — E780 Pure hypercholesterolemia: Secondary | ICD-10-CM | POA: Insufficient documentation

## 2015-03-31 DIAGNOSIS — M1990 Unspecified osteoarthritis, unspecified site: Secondary | ICD-10-CM | POA: Insufficient documentation

## 2015-03-31 DIAGNOSIS — R109 Unspecified abdominal pain: Secondary | ICD-10-CM | POA: Insufficient documentation

## 2015-03-31 DIAGNOSIS — Z791 Long term (current) use of non-steroidal anti-inflammatories (NSAID): Secondary | ICD-10-CM | POA: Insufficient documentation

## 2015-03-31 LAB — COMPREHENSIVE METABOLIC PANEL
ALT: 19 U/L (ref 14–54)
ANION GAP: 12 (ref 5–15)
AST: 26 U/L (ref 15–41)
Albumin: 4.9 g/dL (ref 3.5–5.0)
Alkaline Phosphatase: 67 U/L (ref 38–126)
BILIRUBIN TOTAL: 0.7 mg/dL (ref 0.3–1.2)
BUN: 14 mg/dL (ref 6–20)
CO2: 24 mmol/L (ref 22–32)
CREATININE: 0.55 mg/dL (ref 0.44–1.00)
Calcium: 10.2 mg/dL (ref 8.9–10.3)
Chloride: 103 mmol/L (ref 101–111)
GFR calc Af Amer: >60 mL/min (ref >60)
GFR calc non Af Amer: >60 mL/min (ref >60)
Glucose, Bld: 111 mg/dL — ABNORMAL HIGH (ref 65–99)
POTASSIUM: 3.5 mmol/L (ref 3.5–5.1)
Sodium: 139 mmol/L (ref 135–145)
TOTAL PROTEIN: 8.5 g/dL — AB (ref 6.5–8.1)

## 2015-03-31 LAB — I-STAT TROPONIN, ED
TROPONIN I, POC: 0 ng/mL (ref 0.00–0.08)
Troponin i, poc: 0 ng/mL (ref 0.00–0.08)

## 2015-03-31 LAB — CBC WITH DIFFERENTIAL/PLATELET
BASOS PCT: 0 % (ref 0–1)
Basophils Absolute: 0 10*3/uL (ref 0.0–0.1)
EOS ABS: 0.1 10*3/uL (ref 0.0–0.7)
Eosinophils Relative: 1 % (ref 0–5)
HEMATOCRIT: 44.4 % (ref 36.0–46.0)
HEMOGLOBIN: 14.6 g/dL (ref 12.0–15.0)
Lymphocytes Relative: 30 % (ref 12–46)
Lymphs Abs: 2.9 10*3/uL (ref 0.7–4.0)
MCH: 30.9 pg (ref 26.0–34.0)
MCHC: 32.9 g/dL (ref 30.0–36.0)
MCV: 93.9 fL (ref 78.0–100.0)
MONO ABS: 0.6 10*3/uL (ref 0.1–1.0)
Monocytes Relative: 7 % (ref 3–12)
Neutro Abs: 6.2 10*3/uL (ref 1.7–7.7)
Neutrophils Relative %: 62 % (ref 43–77)
Platelets: 328 10*3/uL (ref 150–400)
RBC: 4.73 MIL/uL (ref 3.87–5.11)
RDW: 13.3 % (ref 11.5–15.5)
WBC: 9.8 10*3/uL (ref 4.0–10.5)

## 2015-03-31 LAB — LIPASE, BLOOD: LIPASE: 22 U/L (ref 22–51)

## 2015-03-31 MED ORDER — MELOXICAM 7.5 MG PO TABS: 7.5000 mg | ORAL_TABLET | Freq: Every day | ORAL | Status: DC

## 2015-03-31 NOTE — ED Notes (Signed)
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 Alert and oriented.

## 2015-03-31 NOTE — Discharge Instructions (Signed)

## 2015-03-31 NOTE — ED Provider Notes (Signed)
CSN: [id number]     Arrival date & time 03/31/15  1703 History   First MD Initiated Contact with Patient 03/31/15 1926     Chief Complaint  Patient presents with  . Neck Pain  . Abdominal Pain     (Consider location/radiation/quality/duration/timing/severity/associated sxs/prior Treatment) HPI Comments: Patient presents to the emergency department for evaluation of left sided neck, shoulder and arm pain. She describes it as a burning pain. Pain has been intermittent today. She reports that the area on the side of her neck is tender. She denies any injury. 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 Patient not expecting any chest discomfort, has not had any shortness of breath. Patient reports that she works out on the stair climber, never has chest pain or shortness of breath. There is no weakness in the upper extremities. She reports that she is going out of town for 2 weeks tomorrow and did not want to go before she got this checked out.  Patient reports that she has "stomach problems". She had an "upset stomach" earlier. She reports that symptoms have resolved and she thinks it was simply being upset about coming to the emergency department. She is without abdominal complaints currently. No vomiting or diarrhea. No rectal bleeding.  Patient is a 74 y.o. female presenting with neck pain and abdominal pain.  Neck Pain Associated symptoms: no numbness and no weakness   Abdominal Pain   Past Medical History  Diagnosis Date  . Hypertension   . High cholesterol    Past Surgical History  Procedure Laterality Date  . Replacement total knee bilateral     History reviewed. No pertinent family history. History  Substance Use Topics  . Smoking status: Never Smoker   . Smokeless tobacco: Not on file  . Alcohol Use: Yes   OB History    No data available     Review of Systems  Gastrointestinal: Positive for abdominal pain.  Musculoskeletal: Positive for arthralgias and neck  pain.  Neurological: Negative for weakness and numbness.  All other systems reviewed and are negative.     Allergies  Review of patient's allergies indicates no known allergies.  Home Medications   Prior to Admission medications   Medication Sig Start Date End Date Taking? Authorizing Provider  amLODipine-benazepril (LOTREL) 10-40 MG per capsule Take 1 capsule by mouth daily.   Yes Historical Provider, MD  aspirin 81 MG tablet Take 81 mg by mouth daily.   Yes Historical Provider, MD  CALCIUM PO Take 1,500 mg by mouth every other day.    Yes Historical Provider, MD  Cholecalciferol (VITAMIN D PO) Take 1 tablet by mouth every other day.   Yes Historical Provider, MD  Coenzyme Q10 (CO Q 10 PO) Take 1 tablet by mouth every other day.   Yes Historical Provider, MD  Cyanocobalamin (VITAMIN B-12 PO) Take 1 tablet by mouth every other day.   Yes Historical Provider, MD  hydrochlorothiazide (HYDRODIURIL) 25 MG tablet Take 25 mg by mouth daily.   Yes Historical Provider, MD  hyoscyamine (LEVSIN SL) 0.125 MG SL tablet Place 0.125 mg under the tongue as needed (before meals(dissolve on tongue)).   Yes Historical Provider, MD  meloxicam (MOBIC) 7.5 MG tablet Take 1 tablet (7.5 mg total) by mouth daily. 03/31/15   Orpah Greek, MD  metFORMIN (GLUCOPHAGE) 500 MG tablet Take 1 tablet (500 mg total) by mouth daily with breakfast. Patient not taking: Reported on 03/31/2015 03/18/13   Leonard Schwartz,  MD  mometasone (NASONEX) 50 MCG/ACT nasal spray Place 2 sprays into the nose daily as needed (allergies).   Yes Historical Provider, MD  simvastatin (ZOCOR) 20 MG tablet Take 20 mg by mouth daily.   Yes Historical Provider, MD   BP 147/75 mmHg  Pulse 77  Temp(Src) 98.2 F (36.8 C) (Oral)  Resp 16  SpO2 97% Physical Exam  Constitutional: She is oriented to person, place, and time. She appears well-developed and well-nourished. No distress.  HENT:  Head: Normocephalic and atraumatic.  Right Ear:  Hearing normal.  Left Ear: Hearing normal.  Nose: Nose normal.  Mouth/Throat: Oropharynx is clear and moist and mucous membranes are normal.  Eyes: Conjunctivae and EOM are normal. Pupils are equal, round, and reactive to light.  Neck: Normal range of motion. Neck supple. Muscular tenderness present.    Cardiovascular: Regular rhythm, S1 normal and S2 normal.  Exam reveals no gallop and no friction rub.   No murmur heard. Pulmonary/Chest: Effort normal and breath sounds normal. No respiratory distress. She exhibits no tenderness.  Abdominal: Soft. Normal appearance and bowel sounds are normal. There is no hepatosplenomegaly. There is no tenderness. There is no rebound, no guarding, no tenderness at McBurney's point and negative Murphy's sign. No hernia.  Musculoskeletal: Normal range of motion.       Left shoulder: She exhibits tenderness. She exhibits normal range of motion and no deformity.  Neurological: She is alert and oriented to person, place, and time. She has normal strength. No cranial nerve deficit or sensory deficit. Coordination normal. GCS eye subscore is 4. GCS verbal subscore is 5. GCS motor subscore is 6.  Skin: Skin is warm, dry and intact. No rash noted. No cyanosis.  Psychiatric: She has a normal mood and affect. Her speech is normal and behavior is normal. Thought content normal.  Nursing note and vitals reviewed.   ED Course  Procedures (including critical care time) Labs Review Labs Reviewed  COMPREHENSIVE METABOLIC PANEL - Abnormal; Notable for the following:    Glucose, Bld 111 (*)    Total Protein 8.5 (*)    All other components within normal limits  CBC WITH DIFFERENTIAL/PLATELET  LIPASE, BLOOD  I-STAT TROPOININ, ED  I-STAT TROPOININ, ED    Imaging Review No results found.   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 Symptoms extremely atypical for cardiac etiology. Evaluation most consistent with cervical radiculopathy.    Orpah Greek, MD 04/04/15 1622

## 2015-04-21 ENCOUNTER — Ambulatory Visit: Payer: Medicare Other

## 2015-05-06 ENCOUNTER — Ambulatory Visit
Admission: RE | Admit: 2015-05-06 | Discharge: 2015-05-06 | Disposition: A | Discharge status: Home / Self Care | Payer: PPO | Source: Ambulatory Visit

## 2015-05-06 ENCOUNTER — Other Ambulatory Visit (HOSPITAL_COMMUNITY): Payer: Self-pay | Admitting: Orthopedic Surgery

## 2015-05-06 DIAGNOSIS — Z96659 Presence of unspecified artificial knee joint: Secondary | ICD-10-CM | POA: Insufficient documentation

## 2015-05-06 DIAGNOSIS — Z1231 Encounter for screening mammogram for malignant neoplasm of breast: Secondary | ICD-10-CM

## 2015-05-06 DIAGNOSIS — T8484XS Pain due to internal orthopedic prosthetic devices, implants and grafts, sequela: Secondary | ICD-10-CM

## 2015-05-07 ENCOUNTER — Other Ambulatory Visit: Payer: Self-pay | Admitting: Family Medicine

## 2015-05-07 DIAGNOSIS — R928 Other abnormal and inconclusive findings on diagnostic imaging of breast: Secondary | ICD-10-CM

## 2015-05-09 ENCOUNTER — Other Ambulatory Visit: Payer: Self-pay | Admitting: Family Medicine

## 2015-05-09 ENCOUNTER — Ambulatory Visit
Admission: RE | Admit: 2015-05-09 | Discharge: 2015-05-09 | Disposition: A | Discharge status: Home / Self Care | Payer: PPO | Source: Ambulatory Visit | Attending: Family Medicine | Admitting: Family Medicine

## 2015-05-09 DIAGNOSIS — R921 Mammographic calcification found on diagnostic imaging of breast: Secondary | ICD-10-CM

## 2015-05-09 DIAGNOSIS — R928 Other abnormal and inconclusive findings on diagnostic imaging of breast: Secondary | ICD-10-CM

## 2015-05-14 ENCOUNTER — Ambulatory Visit
Admission: RE | Admit: 2015-05-14 | Discharge: 2015-05-14 | Disposition: A | Discharge status: Home / Self Care | Payer: PPO | Source: Ambulatory Visit | Attending: Family Medicine | Admitting: Family Medicine

## 2015-05-14 DIAGNOSIS — R921 Mammographic calcification found on diagnostic imaging of breast: Secondary | ICD-10-CM

## 2015-05-14 HISTORY — PX: BREAST BIOPSY: SHX20

## 2015-05-15 ENCOUNTER — Encounter (HOSPITAL_COMMUNITY)
Admission: RE | Admit: 2015-05-15 | Discharge: 2015-05-15 | Disposition: A | Discharge status: Home / Self Care | Payer: PPO | Source: Ambulatory Visit | Attending: Orthopedic Surgery | Admitting: Orthopedic Surgery

## 2015-05-15 ENCOUNTER — Ambulatory Visit (HOSPITAL_COMMUNITY)
Admission: RE | Admit: 2015-05-15 | Discharge: 2015-05-15 | Disposition: A | Discharge status: Home / Self Care | Payer: PPO | Source: Ambulatory Visit | Attending: Orthopedic Surgery | Admitting: Orthopedic Surgery

## 2015-05-15 DIAGNOSIS — T8484XS Pain due to internal orthopedic prosthetic devices, implants and grafts, sequela: Secondary | ICD-10-CM

## 2015-05-15 DIAGNOSIS — Z96653 Presence of artificial knee joint, bilateral: Secondary | ICD-10-CM | POA: Insufficient documentation

## 2015-05-15 DIAGNOSIS — Z96659 Presence of unspecified artificial knee joint: Secondary | ICD-10-CM

## 2015-05-15 DIAGNOSIS — T8484XD Pain due to internal orthopedic prosthetic devices, implants and grafts, subsequent encounter: Secondary | ICD-10-CM | POA: Diagnosis present

## 2015-05-15 MED ORDER — TECHNETIUM TC 99M MEDRONATE IV KIT: 25.5000 | PACK | Freq: Once | INTRAVENOUS | Status: AC | PRN

## 2015-05-29 ENCOUNTER — Other Ambulatory Visit: Payer: Self-pay

## 2015-05-29 DIAGNOSIS — Z1231 Encounter for screening mammogram for malignant neoplasm of breast: Secondary | ICD-10-CM

## 2015-06-12 ENCOUNTER — Other Ambulatory Visit: Payer: Self-pay | Admitting: Orthopedic Surgery

## 2015-07-14 ENCOUNTER — Other Ambulatory Visit (HOSPITAL_COMMUNITY): Payer: PPO

## 2015-07-15 ENCOUNTER — Encounter (HOSPITAL_COMMUNITY): Payer: Self-pay

## 2015-07-15 ENCOUNTER — Encounter (HOSPITAL_COMMUNITY)
Admission: RE | Admit: 2015-07-15 | Discharge: 2015-07-15 | Disposition: A | Discharge status: Home / Self Care | Payer: PPO | Source: Ambulatory Visit | Attending: Orthopedic Surgery | Admitting: Orthopedic Surgery

## 2015-07-15 DIAGNOSIS — I10 Essential (primary) hypertension: Secondary | ICD-10-CM | POA: Insufficient documentation

## 2015-07-15 DIAGNOSIS — Z01812 Encounter for preprocedural laboratory examination: Secondary | ICD-10-CM | POA: Insufficient documentation

## 2015-07-15 DIAGNOSIS — Z7982 Long term (current) use of aspirin: Secondary | ICD-10-CM | POA: Diagnosis not present

## 2015-07-15 DIAGNOSIS — Z01818 Encounter for other preprocedural examination: Secondary | ICD-10-CM | POA: Insufficient documentation

## 2015-07-15 DIAGNOSIS — E785 Hyperlipidemia, unspecified: Secondary | ICD-10-CM | POA: Insufficient documentation

## 2015-07-15 DIAGNOSIS — Z79899 Other long term (current) drug therapy: Secondary | ICD-10-CM | POA: Insufficient documentation

## 2015-07-15 LAB — CBC WITH DIFFERENTIAL/PLATELET
BASOS ABS: 0 10*3/uL (ref 0.0–0.1)
Basophils Relative: 0 %
EOS PCT: 2 %
Eosinophils Absolute: 0.1 10*3/uL (ref 0.0–0.7)
HEMATOCRIT: 40.3 % (ref 36.0–46.0)
HEMOGLOBIN: 13.2 g/dL (ref 12.0–15.0)
LYMPHS ABS: 2.2 10*3/uL (ref 0.7–4.0)
LYMPHS PCT: 30 %
MCH: 30.4 pg (ref 26.0–34.0)
MCHC: 32.8 g/dL (ref 30.0–36.0)
MCV: 92.9 fL (ref 78.0–100.0)
Monocytes Absolute: 0.7 10*3/uL (ref 0.1–1.0)
Monocytes Relative: 9 %
NEUTROS ABS: 4.3 10*3/uL (ref 1.7–7.7)
NEUTROS PCT: 59 %
Platelets: 274 10*3/uL (ref 150–400)
RBC: 4.34 MIL/uL (ref 3.87–5.11)
RDW: 13.3 % (ref 11.5–15.5)
WBC: 7.3 10*3/uL (ref 4.0–10.5)

## 2015-07-15 LAB — URINALYSIS, ROUTINE W REFLEX MICROSCOPIC
BILIRUBIN URINE: NEGATIVE
GLUCOSE, UA: NEGATIVE mg/dL
Hgb urine dipstick: NEGATIVE
KETONES UR: NEGATIVE mg/dL
Leukocytes, UA: NEGATIVE
NITRITE: NEGATIVE
PH: 7.5 (ref 5.0–8.0)
Protein, ur: NEGATIVE mg/dL
SPECIFIC GRAVITY, URINE: 1.018 (ref 1.005–1.030)
Urobilinogen, UA: 1 mg/dL (ref 0.0–1.0)

## 2015-07-15 LAB — COMPREHENSIVE METABOLIC PANEL
ALK PHOS: 68 U/L (ref 38–126)
ALT: 18 U/L (ref 14–54)
AST: 23 U/L (ref 15–41)
Albumin: 4.2 g/dL (ref 3.5–5.0)
Anion gap: 11 (ref 5–15)
BILIRUBIN TOTAL: 0.4 mg/dL (ref 0.3–1.2)
BUN: 9 mg/dL (ref 6–20)
CALCIUM: 9.5 mg/dL (ref 8.9–10.3)
CHLORIDE: 102 mmol/L (ref 101–111)
CO2: 25 mmol/L (ref 22–32)
CREATININE: 0.51 mg/dL (ref 0.44–1.00)
Glucose, Bld: 107 mg/dL — ABNORMAL HIGH (ref 65–99)
Potassium: 3.5 mmol/L (ref 3.5–5.1)
Sodium: 138 mmol/L (ref 135–145)
Total Protein: 7.4 g/dL (ref 6.5–8.1)

## 2015-07-15 LAB — SURGICAL PCR SCREEN
MRSA, PCR: NEGATIVE
STAPHYLOCOCCUS AUREUS: NEGATIVE

## 2015-07-15 LAB — APTT: APTT: 31 s (ref 24–37)

## 2015-07-15 LAB — PROTIME-INR
INR: 1.07 (ref 0.00–1.49)
PROTHROMBIN TIME: 14.1 s (ref 11.6–15.2)

## 2015-07-15 NOTE — Pre-Procedure Instructions (Signed)
Andrea Farmer  07/15/2015      [id number] - [place], [place] - [place] [place]  [address] Phone: [phone] Fax: [phone]    Your procedure is scheduled on  Monday 07/21/15  Report to [facility] Admitting at 900 A.M.  Call this number if you have problems the morning of surgery:  [phone]   Remember:  Do not eat food or drink liquids after midnight.  Take these medicines the morning of surgery with A SIP OF WATER  :  (STOP ASPIRIN, COQ10, MULTIVITAMIN, FISH OIL, VITAMIN E, NO HERBAL MEDICINE)   Do not wear jewelry, make-up or nail polish.  Do not wear lotions, powders, or perfumes.  You may wear deodorant.  Do not shave 48 hours prior to surgery.  Men may shave face and neck.  Do not bring valuables to the hospital.  [facility] is not responsible for any belongings or valuables.  Contacts, dentures or bridgework may not be worn into surgery.  Leave your suitcase in the car.  After surgery it may be brought to your room.  For patients admitted to the hospital, discharge time will be determined by your treatment team.  Patients discharged the day of surgery will not be allowed to drive home.   Name and phone number of your driver:   Special instructions:  [facility] - Preparing for Surgery  Before surgery, you can play an important role.  Because skin is not sterile, your skin needs to be as free of germs as possible.  You can reduce the number of germs on you skin by washing with CHG (chlorahexidine gluconate) soap before surgery.  CHG is an antiseptic cleaner which kills germs and bonds with the skin to continue killing germs even after washing.  Please DO NOT use if you have an allergy to CHG or antibacterial soaps.  If your skin becomes reddened/irritated stop using the CHG and inform your nurse when you arrive at Short Stay.  Do not shave (including legs and underarms) for at least 48 hours prior to the first CHG shower.   You may shave your face.  Please follow these instructions carefully:   1.  Shower with CHG Soap the night before surgery and the                                morning of Surgery.  2.  If you choose to wash your hair, wash your hair first as usual with your       normal shampoo.  3.  After you shampoo, rinse your hair and body thoroughly to remove the                      Shampoo.  4.  Use CHG as you would any other liquid soap.  You can apply chg directly       to the skin and wash gently with scrungie or a clean washcloth.  5.  Apply the CHG Soap to your body ONLY FROM THE NECK DOWN.        Do not use on open wounds or open sores.  Avoid contact with your eyes,       ears, mouth and genitals (private parts).  Wash genitals (private parts)       with your normal soap.  6.  Wash thoroughly, paying special attention to the area where  your surgery        will be performed.  7.  Thoroughly rinse your body with warm water from the neck down.  8.  DO NOT shower/wash with your normal soap after using and rinsing off       the CHG Soap.  9.  Pat yourself dry with a clean towel.            10.  Wear clean pajamas.            11.  Place clean sheets on your bed the night of your first shower and do not        sleep with pets.  Day of Surgery  Do not apply any lotions/deoderants the morning of surgery.  Please wear clean clothes to the hospital/surgery center.    Please read over the following fact sheets that you were given. Pain Booklet, Coughing and Deep Breathing, MRSA Information and Surgical Site Infection Prevention

## 2015-07-16 LAB — URINE CULTURE

## 2015-07-16 NOTE — Progress Notes (Signed)
Anesthesia Chart Review:  Pt is 74 year old female scheduled for L synovectomy, possible L excisional total knee arthroplasty on 07/21/2015 with Dr. Ronnie Derby.   PMH includes: HTN, hyperlipidemia. Never smoker. BMI 33.   Medications include: amlodipine-benazepril, ASA, hctz, simvastatin.   Preoperative labs reviewed.    Chest x-ray 07/15/2015 reviewed. No acute cardiopulmonary disease.   EKG 03/31/2015: Sinus rhythm. PVC. LAD, consider left anterior fascicular block. Anteroseptal infarct, old. Minimal ST depression, lateral leads. Baseline wander in lead(s) I III aVL. When compared to EKG 03/18/13, LAD and PVC new, otherwise no significant change.   If no changes, I anticipate pt can proceed with surgery as scheduled.   Willeen Cass, FNP-BC [facility] Short Stay Surgical [facility]/Anesthesiology Phone: [phone] 07/16/2015 4:50 PM

## 2015-07-20 MED ORDER — TRANEXAMIC ACID 1000 MG/10ML IV SOLN
1000.0000 mg | INTRAVENOUS | Status: AC
  Administered 2015-07-21: 1000 mg via INTRAVENOUS
  Filled 2015-07-20: qty 10

## 2015-07-20 MED ORDER — BUPIVACAINE LIPOSOME 1.3 % IJ SUSP
20.0000 mL | INTRAMUSCULAR | Status: DC
  Filled 2015-07-20 (×2): qty 20

## 2015-07-21 ENCOUNTER — Inpatient Hospital Stay (HOSPITAL_COMMUNITY)
Admission: RE | Admit: 2015-07-21 | Discharge: 2015-07-22 | DRG: 489 | Disposition: A | Discharge status: Home Health | Payer: PPO | Source: Ambulatory Visit | Attending: Orthopedic Surgery | Admitting: Orthopedic Surgery

## 2015-07-21 ENCOUNTER — Inpatient Hospital Stay (HOSPITAL_COMMUNITY): Payer: PPO | Admitting: Anesthesiology

## 2015-07-21 ENCOUNTER — Encounter (HOSPITAL_COMMUNITY)
Admission: RE | Disposition: A | Discharge status: Home Health | Payer: Self-pay | Source: Ambulatory Visit | Attending: Orthopedic Surgery

## 2015-07-21 ENCOUNTER — Inpatient Hospital Stay (HOSPITAL_COMMUNITY): Payer: PPO | Admitting: Emergency Medicine

## 2015-07-21 ENCOUNTER — Encounter (HOSPITAL_COMMUNITY): Payer: Self-pay | Admitting: Certified Registered"

## 2015-07-21 DIAGNOSIS — Z96659 Presence of unspecified artificial knee joint: Secondary | ICD-10-CM

## 2015-07-21 DIAGNOSIS — Y831 Surgical operation with implant of artificial internal device as the cause of abnormal reaction of the patient, or of later complication, without mention of misadventure at the time of the procedure: Secondary | ICD-10-CM | POA: Diagnosis present

## 2015-07-21 DIAGNOSIS — I10 Essential (primary) hypertension: Secondary | ICD-10-CM | POA: Diagnosis present

## 2015-07-21 DIAGNOSIS — M25562 Pain in left knee: Secondary | ICD-10-CM | POA: Diagnosis present

## 2015-07-21 DIAGNOSIS — T8484XA Pain due to internal orthopedic prosthetic devices, implants and grafts, initial encounter: Secondary | ICD-10-CM | POA: Diagnosis present

## 2015-07-21 DIAGNOSIS — E7800 Pure hypercholesterolemia, unspecified: Secondary | ICD-10-CM | POA: Diagnosis present

## 2015-07-21 HISTORY — PX: SYNOVECTOMY: SHX5180

## 2015-07-21 LAB — CBC
HEMATOCRIT: 37.9 % (ref 36.0–46.0)
HEMOGLOBIN: 12.6 g/dL (ref 12.0–15.0)
MCH: 31 pg (ref 26.0–34.0)
MCHC: 33.2 g/dL (ref 30.0–36.0)
MCV: 93.1 fL (ref 78.0–100.0)
Platelets: 266 10*3/uL (ref 150–400)
RBC: 4.07 MIL/uL (ref 3.87–5.11)
RDW: 13.5 % (ref 11.5–15.5)
WBC: 13.1 10*3/uL — AB (ref 4.0–10.5)

## 2015-07-21 LAB — CREATININE, SERUM
Creatinine, Ser: 0.53 mg/dL (ref 0.44–1.00)
GFR calc non Af Amer: >60 mL/min (ref >60)

## 2015-07-21 SURGERY — SYNOVECTOMY
Anesthesia: Spinal | Site: Knee | Laterality: Left

## 2015-07-21 MED ORDER — FENTANYL CITRATE (PF) 100 MCG/2ML IJ SOLN
INTRAMUSCULAR | Status: DC | PRN
  Administered 2015-07-21: 25 ug via INTRAVENOUS

## 2015-07-21 MED ORDER — PROPOFOL 10 MG/ML IV BOLUS
INTRAVENOUS | Status: AC
  Filled 2015-07-21: qty 20

## 2015-07-21 MED ORDER — LORATADINE 10 MG PO TABS
10.0000 mg | ORAL_TABLET | Freq: Every day | ORAL | Status: DC
  Administered 2015-07-21 – 2015-07-22 (×2): 10 mg via ORAL
  Filled 2015-07-21 (×2): qty 1

## 2015-07-21 MED ORDER — BENAZEPRIL HCL 20 MG PO TABS
40.0000 mg | ORAL_TABLET | Freq: Every day | ORAL | Status: DC
  Administered 2015-07-21 – 2015-07-22 (×2): 40 mg via ORAL
  Filled 2015-07-21 (×2): qty 2

## 2015-07-21 MED ORDER — PROPOFOL 500 MG/50ML IV EMUL
INTRAVENOUS | Status: DC | PRN
  Administered 2015-07-21: 25 ug/kg/min via INTRAVENOUS

## 2015-07-21 MED ORDER — DEXTROSE 5 % IV SOLN
500.0000 mg | Freq: Four times a day (QID) | INTRAVENOUS | Status: DC | PRN
  Administered 2015-07-21: 500 mg via INTRAVENOUS
  Filled 2015-07-21 (×2): qty 5

## 2015-07-21 MED ORDER — OXYCODONE HCL 5 MG PO TABS
5.0000 mg | ORAL_TABLET | ORAL | Status: DC | PRN
  Administered 2015-07-21 – 2015-07-22 (×5): 10 mg via ORAL
  Filled 2015-07-21 (×4): qty 2

## 2015-07-21 MED ORDER — ZOLPIDEM TARTRATE 5 MG PO TABS: 5.0000 mg | ORAL_TABLET | Freq: Every evening | ORAL | Status: DC | PRN

## 2015-07-21 MED ORDER — SENNOSIDES-DOCUSATE SODIUM 8.6-50 MG PO TABS: 1.0000 | ORAL_TABLET | Freq: Every evening | ORAL | Status: DC | PRN

## 2015-07-21 MED ORDER — ACETAMINOPHEN 650 MG RE SUPP: 650.0000 mg | Freq: Four times a day (QID) | RECTAL | Status: DC | PRN

## 2015-07-21 MED ORDER — LACTATED RINGERS IV SOLN
INTRAVENOUS | Status: DC
  Administered 2015-07-21: 14:00:00 via INTRAVENOUS
  Administered 2015-07-21: 50 mL/h via INTRAVENOUS

## 2015-07-21 MED ORDER — FLUTICASONE PROPIONATE 50 MCG/ACT NA SUSP
1.0000 | Freq: Every day | NASAL | Status: DC
  Administered 2015-07-21 – 2015-07-22 (×2): 1 via NASAL
  Filled 2015-07-21: qty 16

## 2015-07-21 MED ORDER — ONDANSETRON HCL 4 MG/2ML IJ SOLN
INTRAMUSCULAR | Status: AC
  Filled 2015-07-21: qty 2

## 2015-07-21 MED ORDER — ALUM & MAG HYDROXIDE-SIMETH 200-200-20 MG/5ML PO SUSP: 30.0000 mL | ORAL | Status: DC | PRN

## 2015-07-21 MED ORDER — PHENOL 1.4 % MT LIQD: 1.0000 | OROMUCOSAL | Status: DC | PRN

## 2015-07-21 MED ORDER — METOCLOPRAMIDE HCL 5 MG PO TABS: 5.0000 mg | ORAL_TABLET | Freq: Three times a day (TID) | ORAL | Status: DC | PRN

## 2015-07-21 MED ORDER — PROMETHAZINE HCL 25 MG/ML IJ SOLN: 6.2500 mg | INTRAMUSCULAR | Status: DC | PRN

## 2015-07-21 MED ORDER — SODIUM CHLORIDE 0.9 % IR SOLN
Status: DC | PRN
  Administered 2015-07-21: 1000 mL

## 2015-07-21 MED ORDER — SODIUM CHLORIDE 0.9 % IJ SOLN
INTRAMUSCULAR | Status: DC | PRN
  Administered 2015-07-21: 20 mL via INTRAVENOUS

## 2015-07-21 MED ORDER — CEFAZOLIN SODIUM-DEXTROSE 2-3 GM-% IV SOLR
2.0000 g | Freq: Four times a day (QID) | INTRAVENOUS | Status: AC
  Administered 2015-07-21 (×2): 2 g via INTRAVENOUS
  Filled 2015-07-21 (×2): qty 50

## 2015-07-21 MED ORDER — BISACODYL 5 MG PO TBEC: 5.0000 mg | DELAYED_RELEASE_TABLET | Freq: Every day | ORAL | Status: DC | PRN

## 2015-07-21 MED ORDER — ONDANSETRON HCL 4 MG/2ML IJ SOLN
INTRAMUSCULAR | Status: DC | PRN
  Administered 2015-07-21: 4 mg via INTRAVENOUS

## 2015-07-21 MED ORDER — BUPIVACAINE-EPINEPHRINE (PF) 0.5% -1:200000 IJ SOLN
INTRAMUSCULAR | Status: DC | PRN
  Administered 2015-07-21: 30 mL via PERINEURAL

## 2015-07-21 MED ORDER — OXYCODONE HCL 5 MG PO TABS
ORAL_TABLET | ORAL | Status: AC
  Filled 2015-07-21: qty 2

## 2015-07-21 MED ORDER — HYDROMORPHONE HCL 1 MG/ML IJ SOLN
1.0000 mg | INTRAMUSCULAR | Status: DC | PRN
  Administered 2015-07-21 (×2): 1 mg via INTRAVENOUS
  Filled 2015-07-21 (×2): qty 1

## 2015-07-21 MED ORDER — ACETAMINOPHEN 325 MG PO TABS: 650.0000 mg | ORAL_TABLET | Freq: Four times a day (QID) | ORAL | Status: DC | PRN

## 2015-07-21 MED ORDER — OXYCODONE HCL ER 10 MG PO T12A
10.0000 mg | EXTENDED_RELEASE_TABLET | Freq: Two times a day (BID) | ORAL | Status: DC
  Administered 2015-07-21 – 2015-07-22 (×2): 10 mg via ORAL
  Filled 2015-07-21 (×2): qty 1

## 2015-07-21 MED ORDER — SIMVASTATIN 20 MG PO TABS
20.0000 mg | ORAL_TABLET | Freq: Every day | ORAL | Status: DC
  Administered 2015-07-21: 20 mg via ORAL
  Filled 2015-07-21: qty 1

## 2015-07-21 MED ORDER — CEFAZOLIN SODIUM-DEXTROSE 2-3 GM-% IV SOLR
INTRAVENOUS | Status: AC
  Filled 2015-07-21: qty 50

## 2015-07-21 MED ORDER — CELECOXIB 200 MG PO CAPS
200.0000 mg | ORAL_CAPSULE | Freq: Two times a day (BID) | ORAL | Status: DC
  Administered 2015-07-21 – 2015-07-22 (×2): 200 mg via ORAL
  Filled 2015-07-21 (×3): qty 1

## 2015-07-21 MED ORDER — METOCLOPRAMIDE HCL 5 MG/ML IJ SOLN: 5.0000 mg | Freq: Three times a day (TID) | INTRAMUSCULAR | Status: DC | PRN

## 2015-07-21 MED ORDER — AMLODIPINE BESY-BENAZEPRIL HCL 5-40 MG PO CAPS: 1.0000 | ORAL_CAPSULE | Freq: Every day | ORAL | Status: DC

## 2015-07-21 MED ORDER — CHLORHEXIDINE GLUCONATE 4 % EX LIQD: 60.0000 mL | Freq: Once | CUTANEOUS | Status: DC

## 2015-07-21 MED ORDER — DIPHENHYDRAMINE HCL 12.5 MG/5ML PO ELIX: 12.5000 mg | ORAL_SOLUTION | ORAL | Status: DC | PRN

## 2015-07-21 MED ORDER — FLEET ENEMA 7-19 GM/118ML RE ENEM: 1.0000 | ENEMA | Freq: Once | RECTAL | Status: DC | PRN

## 2015-07-21 MED ORDER — BUPIVACAINE IN DEXTROSE 0.75-8.25 % IT SOLN
INTRATHECAL | Status: DC | PRN
  Administered 2015-07-21: 1.8 mL via INTRATHECAL

## 2015-07-21 MED ORDER — HYDROMORPHONE HCL 1 MG/ML IJ SOLN
INTRAMUSCULAR | Status: AC
  Filled 2015-07-21: qty 1

## 2015-07-21 MED ORDER — ONDANSETRON HCL 4 MG PO TABS: 4.0000 mg | ORAL_TABLET | Freq: Four times a day (QID) | ORAL | Status: DC | PRN

## 2015-07-21 MED ORDER — HYDROMORPHONE HCL 1 MG/ML IJ SOLN
0.2500 mg | INTRAMUSCULAR | Status: DC | PRN
  Administered 2015-07-21 (×2): 0.5 mg via INTRAVENOUS

## 2015-07-21 MED ORDER — MENTHOL 3 MG MT LOZG: 1.0000 | LOZENGE | OROMUCOSAL | Status: DC | PRN

## 2015-07-21 MED ORDER — SODIUM CHLORIDE 0.9 % IV SOLN
INTRAVENOUS | Status: DC | PRN
  Administered 2015-07-21: 20 mL

## 2015-07-21 MED ORDER — ONDANSETRON HCL 4 MG/2ML IJ SOLN
4.0000 mg | Freq: Four times a day (QID) | INTRAMUSCULAR | Status: DC | PRN
  Administered 2015-07-21: 4 mg via INTRAVENOUS
  Filled 2015-07-21: qty 2

## 2015-07-21 MED ORDER — HYOSCYAMINE SULFATE 0.125 MG SL SUBL
0.1250 mg | SUBLINGUAL_TABLET | SUBLINGUAL | Status: DC | PRN
  Filled 2015-07-21: qty 1

## 2015-07-21 MED ORDER — METHOCARBAMOL 500 MG PO TABS
500.0000 mg | ORAL_TABLET | Freq: Four times a day (QID) | ORAL | Status: DC | PRN
  Filled 2015-07-21: qty 1

## 2015-07-21 MED ORDER — AMLODIPINE BESYLATE 5 MG PO TABS
5.0000 mg | ORAL_TABLET | Freq: Every day | ORAL | Status: DC
  Administered 2015-07-21 – 2015-07-22 (×2): 5 mg via ORAL
  Filled 2015-07-21 (×2): qty 1

## 2015-07-21 MED ORDER — SODIUM CHLORIDE 0.9 % IV SOLN: INTRAVENOUS | Status: DC

## 2015-07-21 MED ORDER — EPHEDRINE SULFATE 50 MG/ML IJ SOLN
INTRAMUSCULAR | Status: DC | PRN
  Administered 2015-07-21 (×3): 10 mg via INTRAVENOUS

## 2015-07-21 MED ORDER — CEFAZOLIN SODIUM-DEXTROSE 2-3 GM-% IV SOLR
2.0000 g | INTRAVENOUS | Status: AC
  Administered 2015-07-21: 2 g via INTRAVENOUS

## 2015-07-21 MED ORDER — DOCUSATE SODIUM 100 MG PO CAPS
100.0000 mg | ORAL_CAPSULE | Freq: Two times a day (BID) | ORAL | Status: DC
  Administered 2015-07-21 – 2015-07-22 (×2): 100 mg via ORAL
  Filled 2015-07-21 (×3): qty 1

## 2015-07-21 MED ORDER — BUPIVACAINE-EPINEPHRINE (PF) 0.5% -1:200000 IJ SOLN
INTRAMUSCULAR | Status: AC
  Filled 2015-07-21: qty 30

## 2015-07-21 MED ORDER — HYDROCHLOROTHIAZIDE 25 MG PO TABS
25.0000 mg | ORAL_TABLET | Freq: Every day | ORAL | Status: DC
  Administered 2015-07-21 – 2015-07-22 (×2): 25 mg via ORAL
  Filled 2015-07-21 (×2): qty 1

## 2015-07-21 MED ORDER — FENTANYL CITRATE (PF) 250 MCG/5ML IJ SOLN
INTRAMUSCULAR | Status: AC
  Filled 2015-07-21: qty 5

## 2015-07-21 MED ORDER — ENOXAPARIN SODIUM 30 MG/0.3ML ~~LOC~~ SOLN
30.0000 mg | Freq: Two times a day (BID) | SUBCUTANEOUS | Status: DC
  Administered 2015-07-22: 30 mg via SUBCUTANEOUS
  Filled 2015-07-21: qty 0.3

## 2015-07-21 MED ORDER — SODIUM CHLORIDE 0.9 % IV SOLN
INTRAVENOUS | Status: DC
  Administered 2015-07-21: 17:00:00 via INTRAVENOUS

## 2015-07-21 SURGICAL SUPPLY — 68 items
BAG URINE DRAINAGE (UROLOGICAL SUPPLIES) ×3 IMPLANT
BANDAGE ESMARK 6X9 LF (GAUZE/BANDAGES/DRESSINGS) ×2 IMPLANT
BLADE SAW SGTL 83.5X18.5 (BLADE) ×3 IMPLANT
BLADE SAW SGTL NAR THIN XSHT (BLADE) ×3 IMPLANT
BLADE SURG 10 STRL SS (BLADE) ×27 IMPLANT
BNDG ESMARK 6X9 LF (GAUZE/BANDAGES/DRESSINGS) ×3
BOWL SMART MIX CTS (DISPOSABLE) ×3 IMPLANT
CONT SPEC 4OZ CLIKSEAL STRL BL (MISCELLANEOUS) ×6 IMPLANT
COVER BACK TABLE 24X17X13 BIG (DRAPES) IMPLANT
COVER SURGICAL LIGHT HANDLE (MISCELLANEOUS) ×3 IMPLANT
CUFF TOURNIQUET SINGLE 34IN LL (TOURNIQUET CUFF) IMPLANT
DRAPE EXTREMITY T 121X128X90 (DRAPE) ×3 IMPLANT
DRAPE IMP U-DRAPE 54X76 (DRAPES) ×3 IMPLANT
DRAPE INCISE IOBAN 66X45 STRL (DRAPES) ×6 IMPLANT
DRAPE PROXIMA HALF (DRAPES) ×3 IMPLANT
DRAPE U-SHAPE 47X51 STRL (DRAPES) ×3 IMPLANT
DRSG ADAPTIC 3X8 NADH LF (GAUZE/BANDAGES/DRESSINGS) ×3 IMPLANT
DRSG PAD ABDOMINAL 8X10 ST (GAUZE/BANDAGES/DRESSINGS) ×3 IMPLANT
DURAPREP 26ML APPLICATOR (WOUND CARE) ×6 IMPLANT
ELECT REM PT RETURN 9FT ADLT (ELECTROSURGICAL) ×3
ELECTRODE REM PT RTRN 9FT ADLT (ELECTROSURGICAL) ×2 IMPLANT
EVACUATOR 1/8 PVC DRAIN (DRAIN) ×3 IMPLANT
GAUZE SPONGE 4X4 12PLY STRL (GAUZE/BANDAGES/DRESSINGS) ×3 IMPLANT
GEL ULTRASOUND 20GR AQUASONIC (MISCELLANEOUS) ×3 IMPLANT
GLOVE BIOGEL PI IND STRL 6.5 (GLOVE) ×4 IMPLANT
GLOVE BIOGEL PI IND STRL 7.5 (GLOVE) IMPLANT
GLOVE BIOGEL PI IND STRL 8.5 (GLOVE) ×4 IMPLANT
GLOVE BIOGEL PI INDICATOR 6.5 (GLOVE) ×2
GLOVE BIOGEL PI INDICATOR 7.5 (GLOVE)
GLOVE BIOGEL PI INDICATOR 8.5 (GLOVE) ×2
GLOVE ECLIPSE 6.5 STRL STRAW (GLOVE) ×3 IMPLANT
GLOVE SURG ORTHO 7.0 STRL STRW (GLOVE) IMPLANT
GLOVE SURG ORTHO 8.0 STRL STRW (GLOVE) ×6 IMPLANT
GOWN STRL REUS W/ TWL LRG LVL3 (GOWN DISPOSABLE) ×6 IMPLANT
GOWN STRL REUS W/ TWL XL LVL3 (GOWN DISPOSABLE) ×4 IMPLANT
GOWN STRL REUS W/TWL LRG LVL3 (GOWN DISPOSABLE) ×6
GOWN STRL REUS W/TWL XL LVL3 (GOWN DISPOSABLE) ×2
HANDPIECE INTERPULSE COAX TIP (DISPOSABLE) ×2
HOOD PEEL AWAY FACE SHEILD DIS (HOOD) ×9 IMPLANT
INSERT TIB LCS RP STD+ 10 (Knees) ×3 IMPLANT
KIT BASIN OR (CUSTOM PROCEDURE TRAY) ×3 IMPLANT
KIT ROOM TURNOVER OR (KITS) ×3 IMPLANT
MANIFOLD NEPTUNE II (INSTRUMENTS) ×3 IMPLANT
NEEDLE 18GX1X1/2 (RX/OR ONLY) (NEEDLE) IMPLANT
NEEDLE HYPO 25GX1X1/2 BEV (NEEDLE) ×3 IMPLANT
NS IRRIG 1000ML POUR BTL (IV SOLUTION) ×3 IMPLANT
PACK TOTAL JOINT (CUSTOM PROCEDURE TRAY) ×3 IMPLANT
PACK UNIVERSAL I (CUSTOM PROCEDURE TRAY) ×3 IMPLANT
PAD ARMBOARD 7.5X6 YLW CONV (MISCELLANEOUS) ×6 IMPLANT
PADDING CAST COTTON 6X4 STRL (CAST SUPPLIES) ×3 IMPLANT
SET HNDPC FAN SPRY TIP SCT (DISPOSABLE) ×2 IMPLANT
SPONGE GAUZE 4X4 12PLY STER LF (GAUZE/BANDAGES/DRESSINGS) ×3 IMPLANT
STAPLER VISISTAT 35W (STAPLE) ×3 IMPLANT
SUCTION FRAZIER TIP 10 FR DISP (SUCTIONS) ×3 IMPLANT
SUT VIC AB 0 CTB1 27 (SUTURE) ×6 IMPLANT
SUT VIC AB 1 CT1 27 (SUTURE) ×4
SUT VIC AB 1 CT1 27XBRD ANBCTR (SUTURE) ×8 IMPLANT
SUT VIC AB 2-0 CT1 27 (SUTURE) ×4
SUT VIC AB 2-0 CT1 TAPERPNT 27 (SUTURE) ×4 IMPLANT
SYR 20CC LL (SYRINGE) ×6 IMPLANT
SYR 20ML ECCENTRIC (SYRINGE) IMPLANT
SYR CONTROL 10ML LL (SYRINGE) ×3 IMPLANT
SYRINGE 10CC LL (SYRINGE) ×3 IMPLANT
TOWEL OR 17X24 6PK STRL BLUE (TOWEL DISPOSABLE) ×3 IMPLANT
TOWEL OR 17X26 10 PK STRL BLUE (TOWEL DISPOSABLE) ×3 IMPLANT
TRAY FOLEY CATH 16FRSI W/METER (SET/KITS/TRAYS/PACK) ×3 IMPLANT
TUBE ANAEROBIC SPECIMEN COL (MISCELLANEOUS) IMPLANT
WATER STERILE IRR 1000ML POUR (IV SOLUTION) ×9 IMPLANT

## 2015-07-21 NOTE — Evaluation (Signed)
Physical Therapy Evaluation Patient Details Name: Andrea Farmer MRN: [id number] DOB: Aug 23, 1941 Today's Date: 07/21/2015   History of Present Illness  Pt is a 74 y/o F s/p Lt knee synovectomy w/ poly exchange.  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    Clinical Impression  Patient is s/p above surgery resulting in functional limitations due to the deficits listed below (see PT Problem List). Andrea Farmer is very motivated and ambulated 30 ft in room w/ min guard assist.  She has 24/7 assist available at d/c from her husband. Patient will benefit from skilled PT to increase their independence and safety with mobility to allow discharge to the venue listed below.     Follow Up Recommendations Home health PT;Supervision for mobility/OOB    Equipment Recommendations  None recommended by PT    Recommendations for Other Services OT consult     Precautions / Restrictions Precautions Precautions: Fall;Knee Precaution Booklet Issued: Yes (comment) Precaution Comments: Reviewed precautions to maintain Lt knee extension  Restrictions Weight Bearing Restrictions: Yes LLE Weight Bearing: Weight bearing as tolerated      Mobility  Bed Mobility Overal bed mobility: Modified Independent             General bed mobility comments: HOB slightly elevated but pt does not use bed rail.  Increased time and cues for technique.  Transfers Overall transfer level: Needs assistance Equipment used: Rolling walker (2 wheeled) Transfers: Sit to/from Stand Sit to Stand: Min guard         General transfer comment: Cues for technique and hand placement.  Pt slow to rise.  Pt required cues specifically for knee flexion prior to standing to allow her to push up w/ Bil LEs.  Ambulation/Gait Ambulation/Gait assistance: Min guard Ambulation Distance (Feet): 30 Feet Assistive device: Rolling walker (2 wheeled) Gait Pattern/deviations: Step-to pattern;Antalgic;Trunk flexed;Decreased stride  length;Decreased stance time - left   Gait velocity interpretation: Below normal speed for age/gender General Gait Details: Cues for sequencing and to stand upright.    Stairs            Wheelchair Mobility    Modified Rankin (Stroke Patients Only)       Balance Overall balance assessment: Needs assistance Sitting-balance support: Bilateral upper extremity supported;Feet supported Sitting balance-Leahy Scale: Good     Standing balance support: Bilateral upper extremity supported;During functional activity Standing balance-Leahy Scale: Poor Standing balance comment: Relies on RW for support                             Pertinent Vitals/Pain Pain Assessment: 0-10 Pain Score: 5  Pain Location: Lt knee Pain Descriptors / Indicators: Aching;Sore (improves w/ ambulation) Pain Intervention(s): Limited activity within patient's tolerance;Monitored during session;Repositioned    Home Living Family/patient expects to be discharged to:: Private residence Living Arrangements: Spouse/significant other Available Help at Discharge: Family;Available 24 hours/day (husband) Type of Home: Other(Comment) (condo) Home Access: Level entry     Home Layout: Two level Home Equipment: Walker - 2 wheels;Bedside commode;Cane - single point      Prior Function Level of Independence: Independent               Hand Dominance        Extremity/Trunk Assessment   Upper Extremity Assessment: Defer to OT evaluation           Lower Extremity Assessment: LLE deficits/detail   LLE Deficits / Details: limited ROM and strength as expected  s/p Lt knee surgery     Communication   Communication: No difficulties  Cognition Arousal/Alertness: Awake/alert Behavior During Therapy: WFL for tasks assessed/performed Overall Cognitive Status: Within Functional Limits for tasks assessed                      General Comments General comments (skin integrity, edema,  etc.): Pt's daughter, Andrea Farmer, present during PT session    Exercises Total Joint Exercises Ankle Circles/Pumps: AROM;Both;10 reps;Supine Quad Sets: Strengthening;Both;10 reps;Seated Long Arc Quad: AROM;Left;5 reps;Seated Knee Flexion: AROM;AAROM;Left;5 reps;Seated Goniometric ROM: 23-76      Assessment/Plan    PT Assessment Patient needs continued PT services  PT Diagnosis Difficulty walking;Abnormality of gait;Acute pain   PT Problem List Decreased strength;Decreased range of motion;Decreased activity tolerance;Decreased balance;Decreased mobility;Decreased coordination;Decreased knowledge of use of DME;Decreased safety awareness;Decreased knowledge of precautions;Decreased skin integrity;Pain  PT Treatment Interventions DME instruction;Gait training;Stair training;Functional mobility training;Therapeutic activities;Therapeutic exercise;Balance training;Neuromuscular re-education;Patient/family education;Modalities   PT Goals (Current goals can be found in the Care Plan section) Acute Rehab PT Goals Patient Stated Goal: to go home once ready PT Goal Formulation: With patient/family Time For Goal Achievement: 07/28/15 Potential to Achieve Goals: Good    Frequency Min 5X/week   Barriers to discharge Inaccessible home environment 1 step in home    Co-evaluation               End of Session Equipment Utilized During Treatment: Gait belt Activity Tolerance: Patient tolerated treatment well Patient left: in chair;with call bell/phone within reach;with family/visitor present;Other (comment) (in bone foam) Nurse Communication: Mobility status;Precautions;Weight bearing status         Time: 9562-1308 PT Time Calculation (min) (ACUTE ONLY): 37 min   Charges:   PT Evaluation $Initial PT Evaluation Tier I: 1 Procedure PT Treatments $Therapeutic Exercise: 8-22 mins   PT G Codes:       Joslyn Hy PT, DPT [phone] Pager: [phone] 07/21/2015, 5:11 PM

## 2015-07-21 NOTE — H&P (Signed)
  Andrea Farmer MRN:  [id number] DOB/SEX:  Mar 29, 1941/female  CHIEF COMPLAINT:  Painful left Knee  HISTORY: Patient is a 74 y.o. female presented with a history of pain in the left knee. Onset of symptoms was gradual starting several months ago with rapidly worsening course since that time. Prior procedures on the knee include arthroplasty. Patient has been treated conservatively with over-the-counter NSAIDs and activity modification. Patient currently rates pain in the knee at 10 out of 10 with activity. There is pain at night.  PAST MEDICAL HISTORY: There are no active problems to display for this patient.  Past Medical History  Diagnosis Date  . Hypertension   . High cholesterol    Past Surgical History  Procedure Laterality Date  . Replacement total knee bilateral    . Rectal surgery      HEMORRHOID SURGERY  X2      MEDICATIONS:   No prescriptions prior to admission    ALLERGIES:  No Known Allergies  REVIEW OF SYSTEMS:  Pertinent items noted in HPI and remainder of comprehensive ROS otherwise negative.   FAMILY HISTORY:  No family history on file.  SOCIAL HISTORY:   Social History  Substance Use Topics  . Smoking status: Never Smoker   . Smokeless tobacco: Not on file  . Alcohol Use: Yes     Comment: WINE OCC     EXAMINATION:  Vital signs in last 24 hours:    General appearance: alert, cooperative and no distress Lungs: clear to auscultation bilaterally Heart: regular rate and rhythm, S1, S2 normal, no murmur, click, rub or gallop Abdomen: soft, non-tender; bowel sounds normal; no masses,  no organomegaly Extremities: extremities normal, atraumatic, no cyanosis or edema and Homans sign is negative, no sign of DVT Pulses: 2+ and symmetric Skin: Skin color, texture, turgor normal. No rashes or lesions Neurologic: Alert and oriented X 3, normal strength and tone. Normal symmetric reflexes. Normal coordination and gait  Musculoskeletal:  ROM 0-90, Ligaments  intact,  Imaging Review Plain radiographs demonstrate components intact.  The overall alignment is neutral. The bone quality appears to be good for age and reported activity level.  Assessment/Plan: Left painful knee s/p TKA  The patient history, physical examination and imaging studies are consistent with no degenerative joint disease of the left knee. The patient has failed conservative treatment.  The clearance notes were reviewed.  After discussion with the patient it was felt that a polyexchange vs Total Knee Revision was indicated. The procedure,  risks, and benefits of total knee arthroplasty were presented and reviewed. The risks including but not limited to aseptic loosening, infection, blood clots, vascular injury, stiffness, patella tracking problems complications among others were discussed. The patient acknowledged the explanation, agreed to proceed with the plan.  Ednamae Schiano 07/21/2015, 6:44 AM

## 2015-07-21 NOTE — Progress Notes (Signed)
Utilization review completed.  

## 2015-07-21 NOTE — Progress Notes (Signed)
Orthopedic Tech Progress Note Patient Details:  Andrea Farmer 1941-07-11 [id number] On cpm at 7:00 pm Patient ID: Andrea Farmer, female   DOB: 05/14/1941, 74 y.o.   MRN: [id number]   Braulio Bosch 07/21/2015, 7:02 PM

## 2015-07-21 NOTE — Anesthesia Postprocedure Evaluation (Signed)
  Anesthesia Post-op Note  Patient: Andrea Farmer  Procedure(s) Performed: Procedure(s) (LRB): LEFT KNEE SYNOVECTOMY with POLY EXCHANGE (Left)  Patient Location: PACU  Anesthesia Type: Spinal  Level of Consciousness: awake and alert   Airway and Oxygen Therapy: Patient Spontanous Breathing  Post-op Pain: mild  Post-op Assessment: Post-op Vital signs reviewed, Patient's Cardiovascular Status Stable, Respiratory Function Stable, Patent Airway and No signs of Nausea or vomiting  Last Vitals:  Filed Vitals:   07/21/15 1415  BP:   Pulse: 61  Temp:   Resp: 17    Post-op Vital Signs: stable   Complications: No apparent anesthesia complications

## 2015-07-21 NOTE — Progress Notes (Signed)
Report given to maryann rn as caregiver 

## 2015-07-21 NOTE — Anesthesia Preprocedure Evaluation (Signed)
Anesthesia Evaluation  Patient identified by MRN, date of birth, ID band Patient awake    Reviewed: Allergy & Precautions, NPO status , Patient's Chart, lab work & pertinent test results  Airway Mallampati: II  TM Distance: >3 FB Neck ROM: Full    Dental no notable dental hx.    Pulmonary neg pulmonary ROS,    Pulmonary exam normal breath sounds clear to auscultation       Cardiovascular hypertension, Pt. on medications Normal cardiovascular exam Rhythm:Regular Rate:Normal     Neuro/Psych negative neurological ROS  negative psych ROS   GI/Hepatic negative GI ROS, Neg liver ROS,   Endo/Other  negative endocrine ROS  Renal/GU negative Renal ROS  negative genitourinary   Musculoskeletal negative musculoskeletal ROS (+)   Abdominal   Peds negative pediatric ROS (+)  Hematology negative hematology ROS (+)   Anesthesia Other Findings   Reproductive/Obstetrics negative OB ROS                             Anesthesia Physical Anesthesia Plan  ASA: II  Anesthesia Plan: Spinal   Post-op Pain Management:    Induction: Intravenous  Airway Management Planned: Simple Face Mask  Additional Equipment:   Intra-op Plan:   Post-operative Plan:   Informed Consent: I have reviewed the patients History and Physical, chart, labs and discussed the procedure including the risks, benefits and alternatives for the proposed anesthesia with the patient or authorized representative who has indicated his/her understanding and acceptance.   Dental advisory given  Plan Discussed with: CRNA and Surgeon  Anesthesia Plan Comments:         Anesthesia Quick Evaluation

## 2015-07-21 NOTE — Transfer of Care (Signed)
Immediate Anesthesia Transfer of Care Note  Patient: Andrea Farmer  Procedure(s) Performed: Procedure(s): LEFT KNEE SYNOVECTOMY with POLY EXCHANGE (Left)  Patient Location: PACU  Anesthesia Type:Spinal  Level of Consciousness: awake, alert , oriented and patient cooperative  Airway & Oxygen Therapy: Patient Spontanous Breathing and Patient connected to nasal cannula oxygen  Post-op Assessment: Report given to RN, Post -op Vital signs reviewed and stable and Patient moving all extremities X 4  Post vital signs: Reviewed and stable  Last Vitals:  Filed Vitals:   07/21/15 0907  BP: 165/79  Pulse: 71  Temp: 36.9 C  Resp: 20    Complications: No apparent anesthesia complications

## 2015-07-21 NOTE — Progress Notes (Signed)
Orthopedic Tech Progress Note Patient Details:  Andrea Farmer 1941-02-16 [id number]  CPM Left Knee CPM Left Knee: On Left Knee Flexion (Degrees): 90 Left Knee Extension (Degrees): 0 Additional Comments: trapeze bar patient helper Viewed order from doctor's order list  [name] 07/21/2015, 2:37 PM

## 2015-07-21 NOTE — Anesthesia Procedure Notes (Addendum)
Procedure Name: MAC Date/Time: 07/21/2015 11:15 AM Performed by: Melina Copa, DAVID R Pre-anesthesia Checklist: Patient identified, Emergency Drugs available, Suction available, Patient being monitored and Timeout performed Patient Re-evaluated:Patient Re-evaluated prior to inductionOxygen Delivery Method: Simple face mask Number of attempts: 1   Spinal Patient location during procedure: OR Staffing Performed by: anesthesiologist  Preanesthetic Checklist Completed: patient identified, site marked, surgical consent, pre-op evaluation, timeout performed, IV checked, risks and benefits discussed and monitors and equipment checked Spinal Block Patient position: sitting Prep: Betadine Patient monitoring: heart rate, continuous pulse ox and blood pressure Injection technique: single-shot Needle Needle type: Spinocan  Needle gauge: 22 G Needle length: 9 cm Additional Notes Expiration date of kit checked and confirmed. Patient tolerated procedure well, without complications.

## 2015-07-22 ENCOUNTER — Encounter (HOSPITAL_COMMUNITY): Payer: Self-pay | Admitting: Orthopedic Surgery

## 2015-07-22 LAB — CBC
HCT: 33.8 % — ABNORMAL LOW (ref 36.0–46.0)
HEMOGLOBIN: 11.3 g/dL — AB (ref 12.0–15.0)
MCH: 31.2 pg (ref 26.0–34.0)
MCHC: 33.4 g/dL (ref 30.0–36.0)
MCV: 93.4 fL (ref 78.0–100.0)
PLATELETS: 269 10*3/uL (ref 150–400)
RBC: 3.62 MIL/uL — AB (ref 3.87–5.11)
RDW: 13.6 % (ref 11.5–15.5)
WBC: 10.3 10*3/uL (ref 4.0–10.5)

## 2015-07-22 LAB — BASIC METABOLIC PANEL
ANION GAP: 8 (ref 5–15)
BUN: <5 mg/dL — ABNORMAL LOW (ref 6–20)
CALCIUM: 9 mg/dL (ref 8.9–10.3)
CO2: 29 mmol/L (ref 22–32)
CREATININE: 0.52 mg/dL (ref 0.44–1.00)
Chloride: 98 mmol/L — ABNORMAL LOW (ref 101–111)
Glucose, Bld: 147 mg/dL — ABNORMAL HIGH (ref 65–99)
Potassium: 3.5 mmol/L (ref 3.5–5.1)
SODIUM: 135 mmol/L (ref 135–145)

## 2015-07-22 MED ORDER — METHOCARBAMOL 500 MG PO TABS: 500.0000 mg | ORAL_TABLET | Freq: Four times a day (QID) | ORAL | Status: DC | PRN

## 2015-07-22 MED ORDER — ENOXAPARIN SODIUM 40 MG/0.4ML ~~LOC~~ SOLN: 40.0000 mg | SUBCUTANEOUS | Status: DC

## 2015-07-22 MED ORDER — OXYCODONE HCL ER 10 MG PO T12A: 10.0000 mg | EXTENDED_RELEASE_TABLET | Freq: Two times a day (BID) | ORAL | Status: DC

## 2015-07-22 MED ORDER — OXYCODONE HCL 5 MG PO TABS: 5.0000 mg | ORAL_TABLET | ORAL | Status: DC | PRN

## 2015-07-22 NOTE — Progress Notes (Signed)
Physical Therapy Treatment Patient Details Name: Andrea Farmer MRN: [id number] DOB: [birth year] Today's Date: 07/22/2015    History of Present Illness Pt is a 74 y/o F s/p Lt knee synovectomy w/ poly exchange.  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      PT Comments    Session included ambulation and performance of HEP. Focus of HEP was on knee flexion which was very guarded with exercises and with ambulation. Patient would benefit from second session prior to D/C to review HEP and knee flexion exercises. Patient in agreement.   Follow Up Recommendations  Home health PT;Supervision for mobility/OOB     Equipment Recommendations  None recommended by PT    Recommendations for Other Services       Precautions / Restrictions Precautions Precautions: Fall;Knee Precaution Booklet Issued: Yes (comment) Precaution Comments: reviewed HEP and knee extension Restrictions Weight Bearing Restrictions: Yes LLE Weight Bearing: Weight bearing as tolerated    Mobility  Bed Mobility Overal bed mobility: Modified Independent             General bed mobility comments: Bed flat  Transfers Overall transfer level: Needs assistance Equipment used: Rolling walker (2 wheeled) Transfers: Sit to/from Stand Sit to Stand: Supervision         General transfer comment: consistent with hand placement  Ambulation/Gait Ambulation/Gait assistance: Min guard Ambulation Distance (Feet): 150 Feet Assistive device: Rolling walker (2 wheeled) Gait Pattern/deviations: Step-through pattern;Decreased weight shift to left;Decreased stance time - left Gait velocity: decreased   General Gait Details: encouraging knee flexion with swing phase   Stairs            Wheelchair Mobility    Modified Rankin (Stroke Patients Only)       Balance Overall balance assessment: Needs assistance Sitting-balance support: No upper extremity supported Sitting balance-Leahy Scale: Good      Standing balance support: During functional activity Standing balance-Leahy Scale: Fair Standing balance comment: rw with ambulation                    Cognition Arousal/Alertness: Awake/alert Behavior During Therapy: WFL for tasks assessed/performed Overall Cognitive Status: Within Functional Limits for tasks assessed                      Exercises Total Joint Exercises Ankle Circles/Pumps: AROM;Both;15 reps Quad Sets: Left;Strengthening;10 reps Short Arc Quad: Strengthening;Left;10 reps Heel Slides: AAROM;Left;10 reps Knee Flexion: Left;AROM;10 reps;Seated (gravity assited to overpressure stretch) Goniometric ROM: 60 degrees seated    General Comments        Pertinent Vitals/Pain Pain Assessment: 0-10 Pain Score: 4  Faces Pain Scale: Hurts little more (with sit <> stand) Pain Location: Lt knee Pain Descriptors / Indicators: Sore Pain Intervention(s): Monitored during session;Limited activity within patient's tolerance    Home Living           Prior Function           PT Goals (current goals can now be found in the care plan section) Acute Rehab PT Goals Patient Stated Goal: to go home PT Goal Formulation: With patient/family Time For Goal Achievement: 07/28/15 Potential to Achieve Goals: Good Progress towards PT goals: Progressing toward goals    Frequency  Min 5X/week    PT Plan Current plan remains appropriate    Co-evaluation             End of Session Equipment Utilized During Treatment: Gait belt Activity Tolerance: Patient tolerated treatment well Patient  left: in chair;with call bell/phone within reach;Other (comment) (in knee extension)     Time: 2707-8675 PT Time Calculation (min) (ACUTE ONLY): 29 min  Charges:  $Gait Training: 8-22 mins $Therapeutic Exercise: 8-22 mins                    G Codes:      Cassell Clement, PT, CSCS Pager [phone] Office [phone]  07/22/2015, 10:59 AM

## 2015-07-22 NOTE — Evaluation (Signed)
Occupational Therapy Evaluation Patient Details Name: Andrea Farmer MRN: [id number] DOB: 1941/09/28 Today's Date: 07/22/2015    History of Present Illness Pt is a 74 y/o F s/p Lt knee synovectomy w/ poly exchange.  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     Clinical Impression   Pt reports she was independent with ADLs PTA. Currently pt is min guard overall for safety with ADLs and functional mobility. All education complete, pt verbalized understanding. Pt to d/c home with 24/7 supervision from her husband. Pt with no questions or concerns for OT at this time. Pt ready to d/c from an OT standpoint, signing off at this time. Thank you for this referral.     Follow Up Recommendations  No OT follow up;Supervision - Intermittent    Equipment Recommendations  None recommended by OT    Recommendations for Other Services       Precautions / Restrictions Precautions Precautions: Fall;Knee Precaution Booklet Issued: No Precaution Comments: Reviewed precautions to maintain Lt knee extension  Restrictions Weight Bearing Restrictions: Yes LLE Weight Bearing: Weight bearing as tolerated      Mobility Bed Mobility               General bed mobility comments: Pt in recliner, returned to recliner at end of session  Transfers Overall transfer level: Needs assistance Equipment used: Rolling walker (2 wheeled) Transfers: Sit to/from Stand Sit to Stand: Min guard         General transfer comment: Min guard for safety. Pt with correct hand placement and technique for sit <> stand transfer    Balance Overall balance assessment: Needs assistance Sitting-balance support: No upper extremity supported Sitting balance-Leahy Scale: Good     Standing balance support: Bilateral upper extremity supported Standing balance-Leahy Scale: Poor Standing balance comment: RW for support                            ADL Overall ADL's : Needs  assistance/impaired Eating/Feeding: Set up;Sitting   Grooming: Min guard;Standing   Upper Body Bathing: Set up;Sitting   Lower Body Bathing: Min guard;Sit to/from stand   Upper Body Dressing : Set up;Sitting   Lower Body Dressing: Min guard;Sit to/from stand Lower Body Dressing Details (indicate cue type and reason): Able to reach R sock to don/doff Toilet Transfer: Min guard;Ambulation;BSC;RW (BSC over toilet)   Toileting- Clothing Manipulation and Hygiene: Min guard;Sit to/from stand   Tub/ Shower Transfer: Min guard;Ambulation;Shower seat;Rolling walker   Functional mobility during ADLs: Min guard;Rolling walker General ADL Comments: No family present for OT eval. Educated pt on compensatory strategies for LB ADLs, walk in shower transfer technique, need for close supervision for safety during ADLs and functional mobililty, use of 3 in 1 over toilet, edema management techniques; pt verbalized understanding. Pt reports that she remembers how to do ADLs from previous knee surgeries.      Vision     Perception     Praxis      Pertinent Vitals/Pain Pain Assessment: Faces Faces Pain Scale: Hurts little more (with sit <> stand) Pain Location: L knee Pain Descriptors / Indicators: Grimacing Pain Intervention(s): Limited activity within patient's tolerance;Monitored during session;Repositioned;Ice applied     Hand Dominance     Extremity/Trunk Assessment Upper Extremity Assessment Upper Extremity Assessment: Overall WFL for tasks assessed   Lower Extremity Assessment Lower Extremity Assessment: Defer to PT evaluation   Cervical / Trunk Assessment Cervical / Trunk Assessment: Normal  Communication Communication Communication: No difficulties   Cognition Arousal/Alertness: Awake/alert Behavior During Therapy: WFL for tasks assessed/performed Overall Cognitive Status: Within Functional Limits for tasks assessed                     General Comments        Exercises       Shoulder Instructions      Home Living Family/patient expects to be discharged to:: Private residence Living Arrangements: Spouse/significant other Available Help at Discharge: Family;Available 24 hours/day (husband) Type of Home: Other(Comment) (condo) Home Access: Level entry     Home Layout: One level     Bathroom Shower/Tub: Occupational psychologist: Handicapped height Bathroom Accessibility: Yes How Accessible: Accessible via walker Home Equipment: [place] - 2 wheels;Bedside commode;Cane - single point;Shower seat - built in          Prior Functioning/Environment Level of Independence: Independent             OT Diagnosis: Acute pain   OT Problem List:     OT Treatment/Interventions:      OT Goals(Current goals can be found in the care plan section) Acute Rehab OT Goals Patient Stated Goal: to go home OT Goal Formulation: With patient  OT Frequency:     Barriers to D/C:            Co-evaluation              End of Session Equipment Utilized During Treatment: Gait belt;Rolling walker CPM Left Knee CPM Left Knee: Off  Activity Tolerance: Patient tolerated treatment well Patient left: in chair;with call bell/phone within reach;Other (comment) (zero degree knee on LLE )   Time: 9147-8295 OT Time Calculation (min): 22 min Charges:  OT General Charges $OT Visit: 1 Procedure OT Evaluation $Initial OT Evaluation Tier I: 1 Procedure G-Codes:     Binnie Kand M.S., OTR/L Pager: [phone]  07/22/2015, 9:05 AM

## 2015-07-22 NOTE — Op Note (Signed)
Dictation Number:  [id number]

## 2015-07-22 NOTE — Progress Notes (Signed)
SPORTS MEDICINE AND JOINT REPLACEMENT  Lara Mulch, MD   Carlynn Spry, PA-C [place], [place], [place]  [address]                             [phone]   PROGRESS NOTE  Subjective:  negative for Chest Pain  negative for Shortness of Breath  negative for Nausea/Vomiting   negative for Calf Pain  negative for Bowel Movement   Tolerating Diet: yes         Patient reports pain as 4 on 0-10 scale.    Objective: Vital signs in last 24 hours:   Patient Vitals for the past 24 hrs:  BP Temp Temp src Pulse Resp SpO2  07/22/15 0545 126/66 mmHg 98.3 F (36.8 C) Oral 79 16 96 %  07/22/15 0129 109/65 mmHg - - 70 16 97 %  07/21/15 2041 123/66 mmHg 98 F (36.7 C) Oral 71 16 92 %  07/21/15 1455 121/80 mmHg - - - - -  07/21/15 1454 - 97.5 F (36.4 C) - 69 15 98 %  07/21/15 1445 - - - 65 15 96 %  07/21/15 1440 110/65 mmHg - - - - -  07/21/15 1430 - - - 63 16 97 %  07/21/15 1425 113/62 mmHg - - - - -  07/21/15 1415 - - - 61 17 96 %  07/21/15 1410 107/62 mmHg - - - - -  07/21/15 1400 (!) 108/57 mmHg - - 66 15 97 %  07/21/15 1345 - - - 65 19 99 %  07/21/15 1340 109/64 mmHg 97.9 F (36.6 C) - 69 18 96 %    @flow {1959:LAST@   Intake/Output from previous day:   10/10 0701 - 10/11 0700 In: 1747.5 [P.O.:410; I.V.:1287.5] Out: 870 [Urine:400; Drains:450]   Intake/Output this shift:   10/11 0701 - 10/11 1900 In: 360 [P.O.:360] Out: -    Intake/Output      10/10 0701 - 10/11 0700 10/11 0701 - 10/12 0700   P.O. 410 360   I.V. (mL/kg) 1287.5 (14.7)    IV Piggyback 50    Total Intake(mL/kg) 1747.5 (20) 360 (4.1)   Urine (mL/kg/hr) 400    Drains 450    Blood 20    Total Output 870     Net +877.5 +360        Urine Occurrence 5 x 2 x      LABORATORY DATA:  Recent Labs  07/21/15 1653 07/22/15 0531  WBC 13.1* 10.3  HGB 12.6 11.3*  HCT 37.9 33.8*  PLT 266 269    Recent Labs  07/21/15 1653 07/22/15 0531  NA  --  135  K  --  3.5  CL  --  98*  CO2  --   29  BUN  --  <5*  CREATININE 0.53 0.52  GLUCOSE  --  147*  CALCIUM  --  9.0   Lab Results  Component Value Date   INR 1.07 07/15/2015   INR 1.6* 08/25/2008   INR 1.5 08/24/2008    Examination:  General appearance: alert, cooperative and no distress Extremities: Homans sign is negative, no sign of DVT  Wound Exam: clean, dry, intact   Drainage:  Scant/small amount Serosanguinous exudate  Motor Exam: EHL and FHL Intact  Sensory Exam: Deep Peroneal normal   Assessment:    1 Day Post-Op  Procedure(s) (LRB): LEFT KNEE SYNOVECTOMY with POLY EXCHANGE (Left)  ADDITIONAL DIAGNOSIS:  Active Problems:   S/P revision of total knee  Acute Blood Loss Anemia   Plan: Physical Therapy as ordered Weight Bearing as Tolerated (WBAT)  DVT Prophylaxis:  Lovenox  DISCHARGE PLAN: Home  DISCHARGE NEEDS: HHPT, CPM, Walker and 3-in-1 comode seat         Markesha Hannig 07/22/2015, 1:34 PM

## 2015-07-22 NOTE — Discharge Instructions (Signed)

## 2015-07-22 NOTE — Plan of Care (Signed)
Problem: Consults Goal: Diagnosis- Total Joint Replacement Primary Total Knee     

## 2015-07-22 NOTE — Progress Notes (Signed)
Physical Therapy Treatment Patient Details Name: Andrea Farmer MRN: [id number] DOB: [birth year] Today's Date: 07/22/2015    History of Present Illness Pt is a 74 y/o F s/p Lt knee synovectomy w/ poly exchange.  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      PT Comments    Patient is making good progress with PT.  From a mobility standpoint anticipate patient will be ready for DC home with family assistance. Patient noted to have significant guarding with functional mobility and HEP. Extended time spent on education and performance of knee flexion with muscle relaxation. Patient denies any questions or concerns following session. Patient's daughter present for session.        Follow Up Recommendations  Home health PT;Supervision for mobility/OOB     Equipment Recommendations  None recommended by PT    Recommendations for Other Services       Precautions / Restrictions Precautions Precautions: Knee Precaution Booklet Issued: Yes (comment) Precaution Comments: reviewed HEP and knee extension Restrictions Weight Bearing Restrictions: Yes LLE Weight Bearing: Weight bearing as tolerated    Mobility  Bed Mobility Overal bed mobility: Modified Independent             General bed mobility comments: Bed flat  Transfers Overall transfer level: Needs assistance Equipment used: Rolling walker (2 wheeled) Transfers: Sit to/from Stand Sit to Stand: Supervision         General transfer comment: consistent with hand placement  Ambulation/Gait Ambulation/Gait assistance: Supervision Ambulation Distance (Feet): 150 Feet Assistive device: Rolling walker (2 wheeled) Gait Pattern/deviations: Step-through pattern Gait velocity: decreased   General Gait Details: encouraging knee flexion with swing phase   Stairs Stairs: Yes       General stair comments: denies stairs at home.   Wheelchair Mobility    Modified Rankin (Stroke Patients Only)       Balance  Overall balance assessment: Needs assistance Sitting-balance support: No upper extremity supported Sitting balance-Leahy Scale: Good     Standing balance support: During functional activity Standing balance-Leahy Scale: Fair Standing balance comment: using rw                    Cognition Arousal/Alertness: Awake/alert Behavior During Therapy: WFL for tasks assessed/performed Overall Cognitive Status: Within Functional Limits for tasks assessed                      Exercises Total Joint Exercises Ankle Circles/Pumps: AROM;Both;15 reps Quad Sets: Left;Strengthening;10 reps Short Arc Quad: Strengthening;Left;10 reps Heel Slides: AAROM;Left;10 reps Knee Flexion: Seated;PROM;AROM;Left (focus on quad relaxation into stretch, extended time needed) Goniometric ROM: 74 degrees flexion    General Comments        Pertinent Vitals/Pain Pain Assessment: 0-10 Pain Score: 4  Pain Location: Lt knee Pain Descriptors / Indicators: Sore Pain Intervention(s): Monitored during session;Limited activity within patient's tolerance    Home Living                      Prior Function            PT Goals (current goals can now be found in the care plan section) Acute Rehab PT Goals Patient Stated Goal: to go home PT Goal Formulation: With patient/family Time For Goal Achievement: 07/28/15 Potential to Achieve Goals: Good Progress towards PT goals: Progressing toward goals    Frequency  Min 5X/week    PT Plan Current plan remains appropriate    Co-evaluation  End of Session Equipment Utilized During Treatment: Gait belt Activity Tolerance: Patient tolerated treatment well Patient left: in chair;with call bell/phone within reach;Other (comment)     Time: 9324-1991 PT Time Calculation (min) (ACUTE ONLY): 33 min  Charges:  $Gait Training: 8-22 mins $Therapeutic Exercise: 8-22 mins                    G Codes:      Cassell Clement, PT,  CSCS Pager [phone] Office [phone]  07/22/2015, 2:22 PM

## 2015-07-23 NOTE — Op Note (Signed)
[name], Andrea Farmer NO.:  [id number]  MEDICAL RECORD NO.:  [id number]  LOCATION:  [id number]                        FACILITY:  [facility]  PHYSICIAN:  Estill Bamberg. Ronnie Derby, M.D. DATE OF BIRTH:  [birth year]  DATE OF PROCEDURE:  07/21/2015 DATE OF DISCHARGE:  07/22/2015                              OPERATIVE REPORT   SURGEON:  Estill Bamberg. Ronnie Derby, M.D.  ASSISTANT:  Ardine Bjork, PA-C.  ANESTHESIA:  General.  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  PROCEDURE:  Left knee revision arthroplasty with polyethylene exchange, open synovectomy.  INDICATION FOR PROCEDURE:  The patient is a 74 year old white female, status post a left total knee by Dr. Melrose Nakayama 7 years ago.  She has had pain with walking, came to me, brought by another friends and informed consent was obtained.  Workup was done for infection, and was negative.  DESCRIPTION OF PROCEDURE:  The patient was laid supine, administered general anesthesia.  The left leg was prepped and draped in usual fashion.  The extremity was exsanguinated with the Esmarch and tourniquet was inflated to 300 mmHg and set for 1 hour.  The old incision was used, made with a #10 blade.  A new blade was used to make a median parapatellar arthrotomy.  She had unbelievable amount of scar tissue with preoperative range of motion of 10-80 degrees.  I carved out the scar tissue with #10 blade going into the medial and lateral gutters, suprapatellar pouch.  I was then able to get the leg to about 75 degrees of flexion and removed the polyethylene.  I then got into the back of the knee and removed all of the scar tissues, they were went up to the back of the femoral component as well.  I subperiosteally dissected the MCL to the semimembranosus tendon.  After meticulous scar tissue debridement and complete synovectomy, I then placed the 10 trial back in place and had good flexion-extension gap  balance enough to get 0 degrees of extension to about 93-95 degrees of flexion possibly.  I then lavaged, removed the trial components and then a real new polyethylene component.  I then let the tourniquet down and obtained hemostasis.  I then copiously irrigated again.  I then closed the arthrotomy with figure-of-eight #1 Vicryl sutures, buried 0 Vicryl sutures, subcuticular 3-0 Vicryl sutures and Steri-Strips.  Dressed with Xeroform, dressing sponges, sterile Webril, and Ace wrap.  COMPLICATIONS:  None.  DRAINS:  One Hemovac.  EBL:  300 mL.  TOURNIQUET TIME:  47 minutes.          ______________________________ Estill Bamberg Ronnie Derby, M.D.     SDL/MEDQ  D:  07/22/2015  T:  07/23/2015  Job:  [id number]

## 2015-08-07 NOTE — Discharge Summary (Signed)
[facility]   Lara Mulch, MD   Carlynn Spry, PA-C [place], [place], [place]  [address]                             [phone]  PATIENT ID: Andrea Farmer        MRN:  [id number]          DOB/AGE: 1941-04-22 / 74 y.o.    DISCHARGE SUMMARY  ADMISSION DATE:    07/21/2015 DISCHARGE DATE:   07/22/2015  ADMISSION DIAGNOSIS: loosening-failed total knee arthroplasty left knee    DISCHARGE DIAGNOSIS:  loosening/failed total knee arthroplasty left knee    ADDITIONAL DIAGNOSIS: Active Problems:   S/P revision of total knee  Past Medical History  Diagnosis Date  . Hypertension   . High cholesterol     PROCEDURE: Procedure(s): LEFT KNEE SYNOVECTOMY with POLY EXCHANGE on 07/21/2015  CONSULTS:     HISTORY:  See H&P in chart  HOSPITAL COURSE:  YURANI FETTES is a 74 y.o. admitted on 07/21/2015 and found to have a diagnosis of loosening/failed total knee arthroplasty left knee.  After appropriate laboratory studies were obtained  they were taken to the operating room on 07/21/2015 and underwent Procedure(s): LEFT KNEE SYNOVECTOMY with POLY EXCHANGE.   They were given perioperative antibiotics:  Anti-infectives    Start     Dose/Rate Route Frequency Ordered Stop   07/21/15 1730  ceFAZolin (ANCEF) IVPB 2 g/50 mL premix     2 g 100 mL/hr over 30 Minutes Intravenous Every 6 hours 07/21/15 1526 07/21/15 2310   07/21/15 0957  ceFAZolin (ANCEF) 2-3 GM-% IVPB SOLR    Comments:  Leandrew Koyanagi   : cabinet override      07/21/15 0957 07/21/15 2214   07/21/15 0955  ceFAZolin (ANCEF) IVPB 2 g/50 mL premix     2 g 100 mL/hr over 30 Minutes Intravenous On call to O.R. 07/21/15 0955 07/21/15 1116    .  Tolerated the procedure well.  Placed with a foley intraoperatively.  Given Ofirmev at induction and for 48 hours.    POD# 1: Vital signs were stable.  Patient denied Chest pain, shortness of breath, or calf pain.  Patient was started on Lovenox 30 mg  subcutaneously twice daily at 8am.  Consults to PT, OT, and care management were made.  The patient was weight bearing as tolerated.  CPM was placed on the operative leg 0-90 degrees for 6-8 hours a day.  Incentive spirometry was taught.  Dressing was changed.  Hemovac was discontinued.      POD #2, Continued  PT for ambulation and exercise program.  IV saline locked.  O2 discontinued.    The remainder of the hospital course was dedicated to ambulation and strengthening.   The patient was discharged on 1 day post op in  Good condition.  Blood products given:none  DIAGNOSTIC STUDIES: Recent vital signs: No data found.      Recent laboratory studies: No results for input(s): WBC, HGB, HCT, PLT in the last 168 hours. No results for input(s): NA, K, CL, CO2, BUN, CREATININE, GLUCOSE, CALCIUM in the last 168 hours. Lab Results  Component Value Date   INR 1.07 07/15/2015   INR 1.6* 08/25/2008   INR 1.5 08/24/2008     Recent Radiographic Studies :  Dg Chest 2 View  07/15/2015  CLINICAL DATA:  Preoperative respiratory evaluation prior to synovectomy of  the left knee versus excisional arthroplasty. EXAM: CHEST  2 VIEW COMPARISON:  02/09/2008. FINDINGS: Cardiac silhouette normal in size, unchanged. Thoracic aorta mildly tortuous and atherosclerotic, unchanged. Hilar and mediastinal contours otherwise unremarkable. Stable linear scarring in the lingula. Lungs otherwise clear. No localized airspace consolidation. No pleural effusions. No pneumothorax. Normal pulmonary vascularity. Degenerative changes involving the thoracic spine. IMPRESSION: No acute cardiopulmonary disease. Electronically Signed   By: Evangeline Dakin M.D.   On: 07/15/2015 11:36    DISCHARGE INSTRUCTIONS: Discharge Instructions    CPM    Complete by:  As directed   Continuous passive motion machine (CPM):      Use the CPM from 0 to 90 for 6-8 hours per day.      You may increase by 10 per day.  You may break it up into 2 or  3 sessions per day.      Use CPM for 2 weeks or until you are told to stop.     Call MD / Call 911    Complete by:  As directed   If you experience chest pain or shortness of breath, CALL 911 and be transported to the hospital emergency room.  If you develope a fever above 101 F, pus (white drainage) or increased drainage or redness at the wound, or calf pain, call your surgeon's office.     Change dressing    Complete by:  As directed   Change dressing on Wednesday, then change the dressing daily with sterile 4 x 4 inch gauze dressing and apply TED hose.     Constipation Prevention    Complete by:  As directed   Drink plenty of fluids.  Prune juice may be helpful.  You may use a stool softener, such as Colace (over the counter) 100 mg twice a day.  Use MiraLax (over the counter) for constipation as needed.     Diet - low sodium heart healthy    Complete by:  As directed      Do not put a pillow under the knee. Place it under the heel.    Complete by:  As directed      Driving restrictions    Complete by:  As directed   No driving for 6 weeks     Increase activity slowly as tolerated    Complete by:  As directed      Lifting restrictions    Complete by:  As directed   No lifting for 6 weeks     TED hose    Complete by:  As directed   Use stockings (TED hose) for 2 weeks on both leg(s).  You may remove them at night for sleeping.           DISCHARGE MEDICATIONS:     Medication List    STOP taking these medications        aspirin 81 MG tablet     Fish Oil 1000 MG Caps      TAKE these medications        ALIGN 4 MG Caps  Take 4 mg by mouth daily as needed.     amLODipine-benazepril 5-40 MG capsule  Commonly known as:  LOTREL  Take 1 capsule by mouth daily.     CALCIUM PO  Take 1,500 mg by mouth every other day.     CO Q 10 PO  Take 300 mg by mouth every other day.     enoxaparin 40 MG/0.4ML injection  Commonly known as:  LOVENOX  Inject 0.4 mLs (40 mg total)  into the skin daily.     fexofenadine 180 MG tablet  Commonly known as:  ALLEGRA  Take 180 mg by mouth daily as needed for allergies or rhinitis.     hydrochlorothiazide 25 MG tablet  Commonly known as:  HYDRODIURIL  Take 25 mg by mouth daily.     hyoscyamine 0.125 MG SL tablet  Commonly known as:  LEVSIN SL  Place 0.125 mg under the tongue as needed (before meals(dissolve on tongue)).     methocarbamol 500 MG tablet  Commonly known as:  ROBAXIN  Take 1-2 tablets (500-1,000 mg total) by mouth every 6 (six) hours as needed for muscle spasms.     mometasone 50 MCG/ACT nasal spray  Commonly known as:  NASONEX  Place 2 sprays into the nose daily as needed (allergies).     multivitamin with minerals Tabs tablet  Take 1 tablet by mouth daily.     oxyCODONE 5 MG immediate release tablet  Commonly known as:  Oxy IR/ROXICODONE  Take 1-2 tablets (5-10 mg total) by mouth every 3 (three) hours as needed for breakthrough pain.     oxyCODONE 10 mg 12 hr tablet  Commonly known as:  OXYCONTIN  Take 1 tablet (10 mg total) by mouth every 12 (twelve) hours.     simvastatin 20 MG tablet  Commonly known as:  ZOCOR  Take 20 mg by mouth daily.     VITAMIN B-12 PO  Take 1 tablet by mouth every other day.     VITAMIN D PO  Take 600 mg by mouth every other day.     vitamin E 400 UNIT capsule  Take 400 Units by mouth every other day.        FOLLOW UP VISIT:       Follow-up Information    Follow up with Rudean Haskell, MD. Call on 08/05/2015.   Specialty:  Orthopedic Surgery   Contact information:   [address] [place] [address] [phone]       DISPOSITION: HOME   CONDITION:  Good   Manoj Enriquez 08/07/2015, 1:05 PM

## 2015-10-15 DIAGNOSIS — M25662 Stiffness of left knee, not elsewhere classified: Secondary | ICD-10-CM | POA: Diagnosis not present

## 2015-10-15 DIAGNOSIS — M25562 Pain in left knee: Secondary | ICD-10-CM | POA: Diagnosis not present

## 2015-10-15 DIAGNOSIS — M1712 Unilateral primary osteoarthritis, left knee: Secondary | ICD-10-CM | POA: Diagnosis not present

## 2015-10-15 DIAGNOSIS — R262 Difficulty in walking, not elsewhere classified: Secondary | ICD-10-CM | POA: Diagnosis not present

## 2015-10-22 DIAGNOSIS — M1712 Unilateral primary osteoarthritis, left knee: Secondary | ICD-10-CM | POA: Diagnosis not present

## 2015-10-22 DIAGNOSIS — M25562 Pain in left knee: Secondary | ICD-10-CM | POA: Diagnosis not present

## 2015-10-22 DIAGNOSIS — R262 Difficulty in walking, not elsewhere classified: Secondary | ICD-10-CM | POA: Diagnosis not present

## 2015-10-22 DIAGNOSIS — M25662 Stiffness of left knee, not elsewhere classified: Secondary | ICD-10-CM | POA: Diagnosis not present

## 2015-10-27 DIAGNOSIS — R262 Difficulty in walking, not elsewhere classified: Secondary | ICD-10-CM | POA: Diagnosis not present

## 2015-10-27 DIAGNOSIS — M25562 Pain in left knee: Secondary | ICD-10-CM | POA: Diagnosis not present

## 2015-10-27 DIAGNOSIS — M1712 Unilateral primary osteoarthritis, left knee: Secondary | ICD-10-CM | POA: Diagnosis not present

## 2015-10-27 DIAGNOSIS — M25662 Stiffness of left knee, not elsewhere classified: Secondary | ICD-10-CM | POA: Diagnosis not present

## 2015-10-29 DIAGNOSIS — M1712 Unilateral primary osteoarthritis, left knee: Secondary | ICD-10-CM | POA: Diagnosis not present

## 2015-10-29 DIAGNOSIS — R262 Difficulty in walking, not elsewhere classified: Secondary | ICD-10-CM | POA: Diagnosis not present

## 2015-10-29 DIAGNOSIS — M25662 Stiffness of left knee, not elsewhere classified: Secondary | ICD-10-CM | POA: Diagnosis not present

## 2015-10-29 DIAGNOSIS — M25562 Pain in left knee: Secondary | ICD-10-CM | POA: Diagnosis not present

## 2015-11-03 DIAGNOSIS — M25662 Stiffness of left knee, not elsewhere classified: Secondary | ICD-10-CM | POA: Diagnosis not present

## 2015-11-03 DIAGNOSIS — M25562 Pain in left knee: Secondary | ICD-10-CM | POA: Diagnosis not present

## 2015-11-03 DIAGNOSIS — R262 Difficulty in walking, not elsewhere classified: Secondary | ICD-10-CM | POA: Diagnosis not present

## 2015-11-03 DIAGNOSIS — M1712 Unilateral primary osteoarthritis, left knee: Secondary | ICD-10-CM | POA: Diagnosis not present

## 2015-11-12 DIAGNOSIS — M25562 Pain in left knee: Secondary | ICD-10-CM | POA: Diagnosis not present

## 2015-11-12 DIAGNOSIS — R262 Difficulty in walking, not elsewhere classified: Secondary | ICD-10-CM | POA: Diagnosis not present

## 2015-11-12 DIAGNOSIS — M25662 Stiffness of left knee, not elsewhere classified: Secondary | ICD-10-CM | POA: Diagnosis not present

## 2015-11-12 DIAGNOSIS — M1712 Unilateral primary osteoarthritis, left knee: Secondary | ICD-10-CM | POA: Diagnosis not present

## 2015-12-30 DIAGNOSIS — Z471 Aftercare following joint replacement surgery: Secondary | ICD-10-CM | POA: Diagnosis not present

## 2015-12-30 DIAGNOSIS — Z96652 Presence of left artificial knee joint: Secondary | ICD-10-CM | POA: Diagnosis not present

## 2016-02-16 ENCOUNTER — Other Ambulatory Visit: Payer: Self-pay | Admitting: Gastroenterology

## 2016-02-16 DIAGNOSIS — D122 Benign neoplasm of ascending colon: Secondary | ICD-10-CM | POA: Diagnosis not present

## 2016-02-16 DIAGNOSIS — D120 Benign neoplasm of cecum: Secondary | ICD-10-CM | POA: Diagnosis not present

## 2016-02-16 DIAGNOSIS — D126 Benign neoplasm of colon, unspecified: Secondary | ICD-10-CM | POA: Diagnosis not present

## 2016-02-16 DIAGNOSIS — Z86010 Personal history of colonic polyps: Secondary | ICD-10-CM | POA: Diagnosis not present

## 2016-02-16 DIAGNOSIS — D123 Benign neoplasm of transverse colon: Secondary | ICD-10-CM | POA: Diagnosis not present

## 2016-03-01 DIAGNOSIS — I499 Cardiac arrhythmia, unspecified: Secondary | ICD-10-CM | POA: Diagnosis not present

## 2016-03-05 ENCOUNTER — Encounter: Payer: Self-pay | Admitting: *Deleted

## 2016-03-11 ENCOUNTER — Encounter: Payer: Self-pay | Admitting: Cardiology

## 2016-03-11 ENCOUNTER — Ambulatory Visit (INDEPENDENT_AMBULATORY_CARE_PROVIDER_SITE_OTHER): Payer: PPO | Admitting: Cardiology

## 2016-03-11 VITALS — BP 124/64 | HR 65 | Ht 64.5 in | Wt 195.0 lb

## 2016-03-11 DIAGNOSIS — I493 Ventricular premature depolarization: Secondary | ICD-10-CM | POA: Diagnosis not present

## 2016-03-11 DIAGNOSIS — R1013 Epigastric pain: Secondary | ICD-10-CM | POA: Insufficient documentation

## 2016-03-11 DIAGNOSIS — E7800 Pure hypercholesterolemia, unspecified: Secondary | ICD-10-CM | POA: Diagnosis not present

## 2016-03-11 DIAGNOSIS — K6289 Other specified diseases of anus and rectum: Secondary | ICD-10-CM | POA: Insufficient documentation

## 2016-03-11 DIAGNOSIS — K5901 Slow transit constipation: Secondary | ICD-10-CM | POA: Insufficient documentation

## 2016-03-11 DIAGNOSIS — R7309 Other abnormal glucose: Secondary | ICD-10-CM | POA: Insufficient documentation

## 2016-03-11 DIAGNOSIS — M25569 Pain in unspecified knee: Secondary | ICD-10-CM | POA: Insufficient documentation

## 2016-03-11 DIAGNOSIS — R0602 Shortness of breath: Secondary | ICD-10-CM | POA: Insufficient documentation

## 2016-03-11 DIAGNOSIS — M8580 Other specified disorders of bone density and structure, unspecified site: Secondary | ICD-10-CM | POA: Insufficient documentation

## 2016-03-11 DIAGNOSIS — K589 Irritable bowel syndrome without diarrhea: Secondary | ICD-10-CM | POA: Insufficient documentation

## 2016-03-11 DIAGNOSIS — Z86010 Personal history of colonic polyps: Secondary | ICD-10-CM | POA: Insufficient documentation

## 2016-03-11 DIAGNOSIS — I499 Cardiac arrhythmia, unspecified: Secondary | ICD-10-CM | POA: Insufficient documentation

## 2016-03-11 DIAGNOSIS — M179 Osteoarthritis of knee, unspecified: Secondary | ICD-10-CM

## 2016-03-11 DIAGNOSIS — R140 Abdominal distension (gaseous): Secondary | ICD-10-CM | POA: Insufficient documentation

## 2016-03-11 DIAGNOSIS — M1710 Unilateral primary osteoarthritis, unspecified knee: Secondary | ICD-10-CM

## 2016-03-11 DIAGNOSIS — J3089 Other allergic rhinitis: Secondary | ICD-10-CM | POA: Insufficient documentation

## 2016-03-11 DIAGNOSIS — I10 Essential (primary) hypertension: Secondary | ICD-10-CM | POA: Insufficient documentation

## 2016-03-11 DIAGNOSIS — T7840XA Allergy, unspecified, initial encounter: Secondary | ICD-10-CM | POA: Insufficient documentation

## 2016-03-11 HISTORY — DX: Irritable bowel syndrome, unspecified: K58.9

## 2016-03-11 HISTORY — DX: Unilateral primary osteoarthritis, unspecified knee: M17.10

## 2016-03-11 HISTORY — DX: Osteoarthritis of knee, unspecified: M17.9

## 2016-03-11 LAB — BASIC METABOLIC PANEL
BUN: 16 mg/dL (ref 7–25)
CO2: 26 mmol/L (ref 20–31)
Calcium: 10.1 mg/dL (ref 8.6–10.4)
Chloride: 101 mmol/L (ref 98–110)
Creat: 0.65 mg/dL (ref 0.60–0.93)
Glucose, Bld: 118 mg/dL — ABNORMAL HIGH (ref 65–99)
Potassium: 4.1 mmol/L (ref 3.5–5.3)
Sodium: 139 mmol/L (ref 135–146)

## 2016-03-11 LAB — MAGNESIUM: Magnesium: 1.9 mg/dL (ref 1.5–2.5)

## 2016-03-11 NOTE — Patient Instructions (Signed)
Medication Instructions:   Your physician recommends that you continue on your current medications as directed. Please refer to the Current Medication list given to you today.    Labwork:  TODAY--BMET AND MAGNESIUM    Testing/Procedures:  Your physician has requested that you have an echocardiogram. Echocardiography is a painless test that uses sound waves to create images of your heart. It provides your doctor with information about the size and shape of your heart and how well your heart's chambers and valves are working. This procedure takes approximately one hour. There are no restrictions for this procedure.   Your physician has recommended that you wear a 24 HOUR holter monitor. Holter monitors are medical devices that record the heart's electrical activity. Doctors most often use these monitors to diagnose arrhythmias. Arrhythmias are problems with the speed or rhythm of the heartbeat. The monitor is a small, portable device. You can wear one while you do your normal daily activities. This is usually used to diagnose what is causing palpitations/syncope (passing out).    Follow-Up:  Your physician wants you to follow-up in: [place] will receive a reminder letter in the mail two months in advance. If you don't receive a letter, please call our office to schedule the follow-up appointment.        If you need a refill on your cardiac medications before your next appointment, please call your pharmacy.

## 2016-03-11 NOTE — Progress Notes (Signed)
Cardiology Office Note    Date:  03/11/2016   ID:  Andrea Farmer, DOB 1941-06-04, MRN [id number]  PCP:   Melinda Crutch, MD  Cardiologist: Ena Dawley, MD  Referring physician: Melinda Crutch, MD  Chief complaint: PVCs, shortness of breath.  History of Present Illness:  Andrea Farmer is a 74 y.o. female with prior medical history of hyperlipidemia, hypertension, who recently underwent screening colonoscopy and observed that she has an extra beats on her telemetry. She states that every once a while she feels a few second lasting palpitations, however there no associated with any chest pain, dizziness or syncope. The patient states that she has always been very active but lately has noticed that certain activities take her longer or she has to take a break in between. This is a very slow gradual change rather than a abrupt change she still able to do all activities of daily living without any significant problems.  She doesn't have a family history of sudden cardiac death or coronary artery disease. She has a very distant history of smoking.  Past Medical History  Diagnosis Date  . Hypertension   . High cholesterol   . Osteopenia   . Vitamin D deficiency   . Acid reflux   . Vaginal atrophy   . Pure hypercholesterolemia 03/11/2016  . Hemorrhoid 12/07/2010  . Cardiac arrhythmia 03/11/2016  . Slow transit constipation 03/11/2016  . 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 Pain in joint, lower leg 03/11/2016  . Other abnormal glucose 03/11/2016  . Osteoarthritis of knee 03/11/2016  . Irritable bowel syndrome 03/11/2016  . History of colonic polyps 03/11/2016  . 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 Environmental and seasonal allergies 03/11/2016  . Dyspepsia 03/11/2016  . Anal or rectal pain 03/11/2016  . Abdominal pain, epigastric 03/11/2016  . Abdominal distension (gaseous) 03/11/2016    Past Surgical History  Procedure Laterality Date  . Replacement total knee bilateral    . Rectal surgery     HEMORRHOID SURGERY  X2   . Synovectomy Left 07/21/2015    Procedure: LEFT KNEE SYNOVECTOMY with POLY EXCHANGE;  Surgeon: Vickey Huger, MD;  Location: [facility];  Service: Orthopedics;  Laterality: Left;    Current Medications: Outpatient Prescriptions Prior to Visit  Medication Sig Dispense Refill  . amLODipine-benazepril (LOTREL) 5-40 MG capsule Take 1 capsule by mouth daily.    Marland Kitchen aspirin (ASPIR-81) 81 MG EC tablet Take 81 mg by mouth daily.     . Calcium Carbonate-Vitamin D (CALCIUM 600+D) 600-400 MG-UNIT tablet     . Cholecalciferol (VITAMIN D PO) Take 2,000 Units by mouth every other day.     . Coenzyme Q10 (CO Q 10 PO) Take 200 mg by mouth every other day.     . Cyanocobalamin (VITAMIN B-12 PO) Take 1 tablet by mouth every other day.    . fexofenadine (ALLEGRA) 180 MG tablet Take 180 mg by mouth daily as needed for allergies or rhinitis.    . hydrochlorothiazide (HYDRODIURIL) 25 MG tablet Take 25 mg by mouth daily.    . hyoscyamine (LEVSIN SL) 0.125 MG SL tablet Place 0.125 mg under the tongue as needed (before meals(dissolve on tongue)).    . mometasone (NASONEX) 50 MCG/ACT nasal spray Place 2 sprays into the nose daily as needed (allergies).    . Multiple Vitamin (MULTIVITAMIN WITH MINERALS) TABS tablet Take 1 tablet by mouth daily.    . Probiotic Product (ALIGN) 4 MG CAPS Take 4 mg  by mouth daily as needed.    . simvastatin (ZOCOR) 20 MG tablet Take 20 mg by mouth daily.    . vitamin E 400 UNIT capsule Take 400 Units by mouth every other day.    Marland Kitchen CALCIUM PO Take 1,500 mg by mouth every other day. Reported on 03/11/2016    . enoxaparin (LOVENOX) 40 MG/0.4ML injection Inject 0.4 mLs (40 mg total) into the skin daily. (Patient not taking: Reported on 03/11/2016) 13 Syringe 0  . methocarbamol (ROBAXIN) 500 MG tablet Take 1-2 tablets (500-1,000 mg total) by mouth every 6 (six) hours as needed for muscle spasms. (Patient not taking: Reported on 03/11/2016) 60 tablet 2   No facility-administered  medications prior to visit.     Allergies:   No known allergies   Social History   Social History  . Marital Status: Divorced    Spouse Name: N/A  . Number of Children: N/A  . Years of Education: N/A   Social History Main Topics  . Smoking status: Never Smoker   . Smokeless tobacco: None  . Alcohol Use: Yes     Comment: WINE OCC  . Drug Use: No  . Sexual Activity: Not Asked   Other Topics Concern  . None   Social History Narrative     Family History:  The patient's family history includes Aneurysm in her father; Cancer in her mother.   ROS:   Please see the history of present illness.    ROS All other systems reviewed and are negative.   PHYSICAL EXAM:   VS:  BP 124/64 mmHg  Pulse 65  Ht 5' 4.5" (1.638 m)  Wt 195 lb (88.451 kg)  BMI 32.97 kg/m2   GEN: Well nourished, well developed, in no acute distress HEENT: normal Neck: no JVD, carotid bruits, or masses Cardiac: RRR; no murmurs, rubs, or gallops,no edema  Respiratory:  clear to auscultation bilaterally, normal work of breathing GI: soft, nontender, nondistended, + BS MS: no deformity or atrophy Skin: warm and dry, no rash Neuro:  Alert and Oriented x 3, Strength and sensation are intact Psych: euthymic mood, full affect  Wt Readings from Last 3 Encounters:  03/11/16 195 lb (88.451 kg)  07/21/15 193 lb 1.6 oz (87.59 kg)  07/15/15 193 lb 1.6 oz (87.59 kg)      Studies/Labs Reviewed:   EKG:  EKG is ordered today.  The ekg ordered today demonstrates Sinus rhythm, PVCs.  Recent Labs: 07/15/2015: ALT 18 07/22/2015: BUN <5*; Creatinine, Ser 0.52; Hemoglobin 11.3*; Platelets 269; Potassium 3.5; Sodium 135   Lipid Panel No results found for: CHOL, TRIG, HDL, CHOLHDL, VLDL, LDLCALC, LDLDIRECT  Additional studies/ records that were reviewed today include:  As per history of present illness.    ASSESSMENT:    1. Pure hypercholesterolemia   2. PVC (premature ventricular contraction)   3. SOB  (shortness of breath)      PLAN:  In order of problems listed above:  1. We will order 24-hour Holter monitor to evaluate for possible arrhythmias as the baseline EKG is suggestive of possible ectopic atrial rhythm. We will also evaluate for PVC burden. We will schedule an echocardiogram to evaluate for systolic and diastolic function. The patient is explained that a symptomatic PVCs are harmless in most cases unless there is underlying heart disease. I have reviewed her labs are all normal including TSH. Her blood pressure is well controlled. 2. She is being followed by her primary care physician for hyperlipidemia and currently on  simvastatin 20 mg daily I agree with that. 3. Regarding her shortness of breath, she seems to be used to high level of activity she still able to do most of her activities. Unless there are any abnormalities on her echo cardiogram I don't think any ischemic workup is necessary at this time.  Medication Adjustments/Labs and Tests Ordered: Current medicines are reviewed at length with the patient today.  Concerns regarding medicines are outlined above.  Medication changes, Labs and Tests ordered today are listed in the Patient Instructions below. Patient Instructions  Medication Instructions:   Your physician recommends that you continue on your current medications as directed. Please refer to the Current Medication list given to you today.    Labwork:  TODAY--BMET AND MAGNESIUM    Testing/Procedures:  Your physician has requested that you have an echocardiogram. Echocardiography is a painless test that uses sound waves to create images of your heart. It provides your doctor with information about the size and shape of your heart and how well your heart's chambers and valves are working. This procedure takes approximately one hour. There are no restrictions for this procedure.   Your physician has recommended that you wear a 24 HOUR holter monitor. Holter  monitors are medical devices that record the heart's electrical activity. Doctors most often use these monitors to diagnose arrhythmias. Arrhythmias are problems with the speed or rhythm of the heartbeat. The monitor is a small, portable device. You can wear one while you do your normal daily activities. This is usually used to diagnose what is causing palpitations/syncope (passing out).    Follow-Up:  Your physician wants you to follow-up in: [place] will receive a reminder letter in the mail two months in advance. If you don't receive a letter, please call our office to schedule the follow-up appointment.        If you need a refill on your cardiac medications before your next appointment, please call your pharmacy.       Signed, Ena Dawley, MD  03/11/2016 12:01 PM    [facility] [place], [place], [place]  [address] Phone: [phone]; Fax: [phone]

## 2016-03-12 ENCOUNTER — Ambulatory Visit (INDEPENDENT_AMBULATORY_CARE_PROVIDER_SITE_OTHER): Payer: PPO

## 2016-03-12 ENCOUNTER — Ambulatory Visit (HOSPITAL_COMMUNITY): Payer: PPO | Attending: Cardiology

## 2016-03-12 ENCOUNTER — Other Ambulatory Visit: Payer: Self-pay

## 2016-03-12 DIAGNOSIS — E785 Hyperlipidemia, unspecified: Secondary | ICD-10-CM | POA: Diagnosis not present

## 2016-03-12 DIAGNOSIS — I493 Ventricular premature depolarization: Secondary | ICD-10-CM | POA: Diagnosis not present

## 2016-03-12 DIAGNOSIS — I340 Nonrheumatic mitral (valve) insufficiency: Secondary | ICD-10-CM | POA: Diagnosis not present

## 2016-03-12 DIAGNOSIS — R0602 Shortness of breath: Secondary | ICD-10-CM | POA: Insufficient documentation

## 2016-03-12 DIAGNOSIS — I071 Rheumatic tricuspid insufficiency: Secondary | ICD-10-CM | POA: Insufficient documentation

## 2016-03-16 ENCOUNTER — Telehealth: Payer: Self-pay | Admitting: Cardiology

## 2016-03-16 NOTE — Telephone Encounter (Signed)
Notes Recorded by Nuala Alpha, LPN on [id number] at 075-GRM PM Notified the pt that per Dr Meda Coffee, her echo showed normal LVEF, with mildly elevated right sided pressures.  Informed the pt that Dr Meda Coffee wanted me to ask her if she snores at night, and if she wants to be evaluated for sleep apnea.  Informed the pt that if she's not interested in having this done and if she doesn't snore, then we will just follow up with her as planned, for one year out.  Per the pt, she states she only snores occasionally. Pt states "If I'm really tired, then I might snore." Pt asked her Spouse and he states that the pt does not snore.  Pt states she prefers holding off on getting a sleep study done at this time. Pt states she will revisit the idea, when she see's Dr Meda Coffee in one year.  Informed the pt that would be ok and I will update Dr Meda Coffee about her decision.  Pt verbalized understanding and agrees with this plan.

## 2016-03-16 NOTE — Telephone Encounter (Signed)
Mrs.Saintvil is returning your call .Marland Kitchen Thanks

## 2016-03-19 ENCOUNTER — Telehealth: Payer: Self-pay | Admitting: *Deleted

## 2016-03-19 DIAGNOSIS — I493 Ventricular premature depolarization: Secondary | ICD-10-CM

## 2016-03-19 DIAGNOSIS — I10 Essential (primary) hypertension: Secondary | ICD-10-CM

## 2016-03-19 DIAGNOSIS — R0602 Shortness of breath: Secondary | ICD-10-CM

## 2016-03-19 NOTE — Telephone Encounter (Signed)
Notified the pt of her 24 hour holter monitor results per Dr Meda Coffee, as mentioned below:   Sinus bradycardia to sinus rhythm.  Frequent PVC, few couplets.  Infrequent PACs and a very short atrial tachycardia.  No therapy needed for the ectopic beats, as they are not too frequent and LVEF is normal. However, I would suggest a nuclear stress test to evaluate for ischemia. If negative we will just follow in 1 year.   Informed the pt that I will place the order for her [place] (pt having a lexi done for recent knee Sx and will be having the other knee operated on, in the near future) in the system, and send a message to our [facility] schedulers to call her back and arrange this appt.  Went over [employer] with the pt over the phone.  Advised the pt to hold her HCTZ the morning of her lexiscan.  Pt verbalized understanding and agrees with this plan.

## 2016-03-19 NOTE — Telephone Encounter (Signed)
-----   Message from Dorothy Spark, MD sent at 03/19/2016  3:48 PM EDT -----   ----- Message -----    From: Dorothy Spark, MD    Sent: 03/19/2016   3:48 PM      To: Dorothy Spark, MD

## 2016-03-25 ENCOUNTER — Telehealth (HOSPITAL_COMMUNITY): Payer: Self-pay | Admitting: *Deleted

## 2016-03-25 NOTE — Telephone Encounter (Signed)
Left message on voicemail in reference to upcoming appointment scheduled for 03/31/16. Phone number given for a call back so details instructions can be given. Sharmin Foulk, [place]

## 2016-03-26 ENCOUNTER — Telehealth (HOSPITAL_COMMUNITY): Payer: Self-pay

## 2016-03-26 NOTE — Telephone Encounter (Signed)
Patient given detailed instructions per Myocardial Perfusion Study Information Sheet for the test on 03/31/2016  at 7:45. Patient notified to arrive 15 minutes early and that it is imperative to arrive on time for appointment to keep from having the test rescheduled.  If you need to cancel or reschedule your appointment, please call the office within 24 hours of your appointment. Failure to do so may result in a cancellation of your appointment, and a $50 no show fee. Patient verbalized understanding.EHK

## 2016-03-31 ENCOUNTER — Ambulatory Visit (HOSPITAL_COMMUNITY): Payer: PPO | Attending: Cardiovascular Disease

## 2016-03-31 DIAGNOSIS — I10 Essential (primary) hypertension: Secondary | ICD-10-CM | POA: Insufficient documentation

## 2016-03-31 DIAGNOSIS — R9439 Abnormal result of other cardiovascular function study: Secondary | ICD-10-CM | POA: Diagnosis not present

## 2016-03-31 DIAGNOSIS — I493 Ventricular premature depolarization: Secondary | ICD-10-CM

## 2016-03-31 DIAGNOSIS — R0602 Shortness of breath: Secondary | ICD-10-CM | POA: Diagnosis not present

## 2016-03-31 LAB — MYOCARDIAL PERFUSION IMAGING
LV dias vol: 81 mL (ref 46–106)
LV sys vol: 25 mL
Peak HR: 86 {beats}/min
RATE: 0.36
Rest HR: 63 {beats}/min
SDS: 9
SRS: 4
SSS: 13
TID: 1.11

## 2016-03-31 MED ORDER — TECHNETIUM TC 99M TETROFOSMIN IV KIT
31.9000 | PACK | Freq: Once | INTRAVENOUS | Status: AC | PRN
  Administered 2016-03-31: 31.9 via INTRAVENOUS
  Filled 2016-03-31: qty 32

## 2016-03-31 MED ORDER — REGADENOSON 0.4 MG/5ML IV SOLN
0.4000 mg | Freq: Once | INTRAVENOUS | Status: AC
  Administered 2016-03-31: 0.4 mg via INTRAVENOUS

## 2016-03-31 MED ORDER — TECHNETIUM TC 99M TETROFOSMIN IV KIT
10.1000 | PACK | Freq: Once | INTRAVENOUS | Status: AC | PRN
  Administered 2016-03-31: 10.1 via INTRAVENOUS
  Filled 2016-03-31: qty 10

## 2016-04-02 ENCOUNTER — Telehealth: Payer: Self-pay | Admitting: *Deleted

## 2016-04-02 DIAGNOSIS — I259 Chronic ischemic heart disease, unspecified: Secondary | ICD-10-CM

## 2016-04-02 DIAGNOSIS — I10 Essential (primary) hypertension: Secondary | ICD-10-CM

## 2016-04-02 DIAGNOSIS — L820 Inflamed seborrheic keratosis: Secondary | ICD-10-CM | POA: Diagnosis not present

## 2016-04-02 DIAGNOSIS — E7800 Pure hypercholesterolemia, unspecified: Secondary | ICD-10-CM

## 2016-04-02 DIAGNOSIS — R9439 Abnormal result of other cardiovascular function study: Secondary | ICD-10-CM

## 2016-04-02 NOTE — Telephone Encounter (Signed)
-----   Message from Dorothy Spark, MD sent at 04/02/2016 12:33 PM EDT ----- Please tell her that her stress test is borderline and I would like to perform a coronary CT to further evaluate exact blockages in her coronary arteries. Thank you, KN

## 2016-04-02 NOTE — Telephone Encounter (Signed)
Notified the pt that per Dr Meda Coffee, her stress test is borderline and she recommends that we perform a coronary ct to further evaluate exact blockages in her coronary arteries.  Informed the pt that I will place the order in the system and someone from our scheduling dept will call her back to arrange this image, once pre-certed.  Pt verbalized understanding and agrees with this plan.

## 2016-04-06 ENCOUNTER — Telehealth: Payer: Self-pay | Admitting: Cardiology

## 2016-04-06 ENCOUNTER — Encounter: Payer: Self-pay | Admitting: Cardiology

## 2016-04-06 NOTE — Telephone Encounter (Signed)
Went over instructions as mentioned below to prepare the pt for her coronary cta, scheduled for tomorrow 04/07/16.    The day before the test:    Stop drinking caffeinated beverages. These include energy drinks, tea, soda, coffee, and hot chocolate.  On the day of the test:   About 4 hours before the test, stop eating and drinking anything but water as advised by your health care provider.  Avoid wearing jewelry. You will have to undress from the waist up and wear a hospital gown.  Pt verbalized understanding and agrees with this plan.

## 2016-04-06 NOTE — Telephone Encounter (Signed)
New message    Pt verbalized that she was not given the proper instructions for the procedure 04-07-16 and she said she called and an e-mail was suppose to be sent  Educated pt that I will send message to rn

## 2016-04-07 ENCOUNTER — Ambulatory Visit (HOSPITAL_COMMUNITY)
Admission: RE | Admit: 2016-04-07 | Discharge: 2016-04-07 | Disposition: A | Discharge status: Home / Self Care | Payer: PPO | Source: Ambulatory Visit | Attending: Cardiology | Admitting: Cardiology

## 2016-04-07 ENCOUNTER — Encounter (HOSPITAL_COMMUNITY): Payer: Self-pay

## 2016-04-07 DIAGNOSIS — I259 Chronic ischemic heart disease, unspecified: Secondary | ICD-10-CM | POA: Diagnosis not present

## 2016-04-07 DIAGNOSIS — R911 Solitary pulmonary nodule: Secondary | ICD-10-CM | POA: Insufficient documentation

## 2016-04-07 DIAGNOSIS — I10 Essential (primary) hypertension: Secondary | ICD-10-CM | POA: Diagnosis not present

## 2016-04-07 DIAGNOSIS — R9439 Abnormal result of other cardiovascular function study: Secondary | ICD-10-CM | POA: Insufficient documentation

## 2016-04-07 DIAGNOSIS — I2510 Atherosclerotic heart disease of native coronary artery without angina pectoris: Secondary | ICD-10-CM | POA: Diagnosis not present

## 2016-04-07 DIAGNOSIS — E7800 Pure hypercholesterolemia, unspecified: Secondary | ICD-10-CM | POA: Diagnosis not present

## 2016-04-07 MED ORDER — IOPAMIDOL (ISOVUE-370) INJECTION 76%
INTRAVENOUS | Status: AC
  Administered 2016-04-07: 100 mL
  Filled 2016-04-07: qty 100

## 2016-04-07 MED ORDER — NITROGLYCERIN 0.4 MG SL SUBL
0.4000 mg | SUBLINGUAL_TABLET | SUBLINGUAL | Status: DC | PRN
  Administered 2016-04-07: 0.4 mg via SUBLINGUAL

## 2016-04-07 MED ORDER — NITROGLYCERIN 0.4 MG SL SUBL
SUBLINGUAL_TABLET | SUBLINGUAL | Status: AC
  Filled 2016-04-07: qty 2

## 2016-04-07 MED ORDER — METOPROLOL TARTRATE 5 MG/5ML IV SOLN
5.0000 mg | INTRAVENOUS | Status: DC | PRN
  Administered 2016-04-07: 5 mg via INTRAVENOUS

## 2016-04-07 MED ORDER — METOPROLOL TARTRATE 5 MG/5ML IV SOLN
INTRAVENOUS | Status: AC
  Filled 2016-04-07: qty 5

## 2016-04-08 ENCOUNTER — Telehealth: Payer: Self-pay | Admitting: *Deleted

## 2016-04-08 DIAGNOSIS — R9439 Abnormal result of other cardiovascular function study: Secondary | ICD-10-CM | POA: Insufficient documentation

## 2016-04-08 DIAGNOSIS — R9389 Abnormal findings on diagnostic imaging of other specified body structures: Secondary | ICD-10-CM | POA: Insufficient documentation

## 2016-04-08 DIAGNOSIS — E7800 Pure hypercholesterolemia, unspecified: Secondary | ICD-10-CM

## 2016-04-08 MED ORDER — ROSUVASTATIN CALCIUM 20 MG PO TABS: 20.0000 mg | ORAL_TABLET | Freq: Every day | ORAL | Status: DC

## 2016-04-08 NOTE — Telephone Encounter (Signed)
-----   Message from Dorothy Spark, MD sent at 04/08/2016  9:40 AM EDT ----- The patient has moderate cholesterol plaque in 2 arteries, however no severe plaque that would require intervention. However we need to be more aggressive on her lipid management and I would suggest to switch her simvastatin to either rosuvastatin 20 mg daily or atorvastatin 40 mg daily. If she agrees to switching I would suggest checking NMR lipid profile and CMP in 6 weeks.

## 2016-04-08 NOTE — Telephone Encounter (Signed)
Spoke with the pt to inform her that per Dr Meda Coffee, her coronary cta showed that she has moderate cholesterol plaque in 2 arteries, however, no severe plaque that would require intervention.  Informed the pt that per Dr Meda Coffee, we will need to be more aggressive on her lipid management and she suggest that we d/c her simvastatin and switch her too either rosuvastatin 20 mg po daily or atorvastatin 40 mg po daily. Informed the pt that Dr Meda Coffee would like for her to come back in 6 weeks to check a NMR W lipids and a CMET.  Pt agreed to switch her statin to rosuvastatin 20 mg po daily.  Pt scheduled for lab work to check NMR W Lipids and CMET on 05/20/16.   Informed the pt that I will send in a month supply of rosuvastatin to her pharmacy of choice, to make sure she tolerates this switch appropriately.  Informed the pt that if she responds well to this statin, then she should call our office back or notify her pharmacy, so we can refill for a 90 day supply.  Pt verbalized understanding and agrees with this plan.

## 2016-04-19 DIAGNOSIS — H2513 Age-related nuclear cataract, bilateral: Secondary | ICD-10-CM | POA: Diagnosis not present

## 2016-04-20 ENCOUNTER — Telehealth: Payer: Self-pay | Admitting: Cardiology

## 2016-04-20 DIAGNOSIS — Z96653 Presence of artificial knee joint, bilateral: Secondary | ICD-10-CM | POA: Diagnosis not present

## 2016-04-20 DIAGNOSIS — Z471 Aftercare following joint replacement surgery: Secondary | ICD-10-CM | POA: Diagnosis not present

## 2016-04-20 DIAGNOSIS — M25561 Pain in right knee: Secondary | ICD-10-CM | POA: Diagnosis not present

## 2016-04-20 NOTE — Telephone Encounter (Signed)
Walk in Pt form-[facility]-Clearance paper dropped off will give to [facility] Wednesday On her next day back In office.

## 2016-04-21 ENCOUNTER — Telehealth: Payer: Self-pay | Admitting: Cardiology

## 2016-04-21 NOTE — Telephone Encounter (Signed)
New message:   She wants to make sure you received the surgical clearance form she left yesterday at the front desk. Also have it been faxed back to Dr Ronnie Derby? Please call and let her know the status of everything.

## 2016-04-21 NOTE — Telephone Encounter (Signed)
Notified the pt that I faxed this paperwork to Dr Ruel Favors office today, and she should be receiving a call from them within the near future.  Pt verbalized understanding and gracious for all the assistance provided.

## 2016-04-23 ENCOUNTER — Other Ambulatory Visit: Payer: Self-pay | Admitting: Orthopedic Surgery

## 2016-05-07 ENCOUNTER — Ambulatory Visit
Admission: RE | Admit: 2016-05-07 | Discharge: 2016-05-07 | Disposition: A | Discharge status: Home / Self Care | Payer: PPO | Source: Ambulatory Visit

## 2016-05-07 DIAGNOSIS — Z1231 Encounter for screening mammogram for malignant neoplasm of breast: Secondary | ICD-10-CM | POA: Diagnosis not present

## 2016-05-19 DIAGNOSIS — Z0000 Encounter for general adult medical examination without abnormal findings: Secondary | ICD-10-CM | POA: Diagnosis not present

## 2016-05-19 DIAGNOSIS — I10 Essential (primary) hypertension: Secondary | ICD-10-CM | POA: Diagnosis not present

## 2016-05-20 ENCOUNTER — Other Ambulatory Visit: Payer: PPO | Admitting: *Deleted

## 2016-05-20 DIAGNOSIS — D2271 Melanocytic nevi of right lower limb, including hip: Secondary | ICD-10-CM | POA: Diagnosis not present

## 2016-05-20 DIAGNOSIS — D225 Melanocytic nevi of trunk: Secondary | ICD-10-CM | POA: Diagnosis not present

## 2016-05-20 DIAGNOSIS — L821 Other seborrheic keratosis: Secondary | ICD-10-CM | POA: Diagnosis not present

## 2016-05-20 DIAGNOSIS — D2262 Melanocytic nevi of left upper limb, including shoulder: Secondary | ICD-10-CM | POA: Diagnosis not present

## 2016-05-20 DIAGNOSIS — E7800 Pure hypercholesterolemia, unspecified: Secondary | ICD-10-CM | POA: Diagnosis not present

## 2016-05-20 DIAGNOSIS — D692 Other nonthrombocytopenic purpura: Secondary | ICD-10-CM | POA: Diagnosis not present

## 2016-05-20 DIAGNOSIS — D1801 Hemangioma of skin and subcutaneous tissue: Secondary | ICD-10-CM | POA: Diagnosis not present

## 2016-05-20 DIAGNOSIS — L814 Other melanin hyperpigmentation: Secondary | ICD-10-CM | POA: Diagnosis not present

## 2016-05-20 DIAGNOSIS — R9389 Abnormal findings on diagnostic imaging of other specified body structures: Secondary | ICD-10-CM

## 2016-05-20 DIAGNOSIS — R938 Abnormal findings on diagnostic imaging of other specified body structures: Secondary | ICD-10-CM | POA: Diagnosis not present

## 2016-05-20 DIAGNOSIS — D2272 Melanocytic nevi of left lower limb, including hip: Secondary | ICD-10-CM | POA: Diagnosis not present

## 2016-05-20 LAB — COMPREHENSIVE METABOLIC PANEL
ALT: 16 U/L (ref 6–29)
AST: 19 U/L (ref 10–35)
Albumin: 4.4 g/dL (ref 3.6–5.1)
Alkaline Phosphatase: 74 U/L (ref 33–130)
BUN: 16 mg/dL (ref 7–25)
CO2: 29 mmol/L (ref 20–31)
Calcium: 9.9 mg/dL (ref 8.6–10.4)
Chloride: 101 mmol/L (ref 98–110)
Creat: 0.58 mg/dL — ABNORMAL LOW (ref 0.60–0.93)
Glucose, Bld: 109 mg/dL — ABNORMAL HIGH (ref 65–99)
Potassium: 3.9 mmol/L (ref 3.5–5.3)
Sodium: 138 mmol/L (ref 135–146)
Total Bilirubin: 0.4 mg/dL (ref 0.2–1.2)
Total Protein: 7.5 g/dL (ref 6.1–8.1)

## 2016-05-24 LAB — CARDIO IQ(R) ADVANCED LIPID PANEL
Apolipoprotein B: 76 mg/dL (ref 49–103)
Cholesterol, Total: 164 mg/dL (ref 125–200)
Cholesterol/HDL Ratio: 3 calc (ref <=5.0)
HDL Cholesterol: 55 mg/dL (ref >=46)
LDL Large: 6317 nmol/L (ref 5038–17886)
LDL Medium: 385 nmol/L (ref 121–397)
LDL Particle Number: 1725 nmol/L (ref 1016–2185)
LDL Peak Size: 213.9 Angstrom — ABNORMAL LOW (ref >=218.2)
LDL Small: 359 nmol/L (ref 115–386)
LDL, Calculated: 91 mg/dL
Lipoprotein (a): <10 nmol/L (ref <75)
Non-HDL Cholesterol: 109 mg/dL
Triglycerides: 88 mg/dL

## 2016-05-28 DIAGNOSIS — Z9189 Other specified personal risk factors, not elsewhere classified: Secondary | ICD-10-CM | POA: Diagnosis not present

## 2016-05-28 DIAGNOSIS — Z01419 Encounter for gynecological examination (general) (routine) without abnormal findings: Secondary | ICD-10-CM | POA: Diagnosis not present

## 2016-06-23 DIAGNOSIS — Z23 Encounter for immunization: Secondary | ICD-10-CM | POA: Diagnosis not present

## 2016-06-24 NOTE — Pre-Procedure Instructions (Addendum)
Andrea Farmer  06/24/2016      [id number] - [address], [place] [address] Phone: [phone] Fax: [phone]    Your procedure is scheduled on Mon, Sept 25 @ 9:35 AM  Report to [facility] Admitting at 7:30 AM  Call this number if you have problems the morning of surgery:  [phone]   Remember:  Do not eat food or drink liquids after midnight.               You may use your Nasonex and take  Allegra if needed.   Stop taking your Aspirin, CO Q10, Omega 3, any Vitamins or Herbal Medications. No Goody's,BC's,Aleve,Advil,Motrin,or Ibuprofen.    Do not wear jewelry, make-up or nail polish.  Do not wear lotions, powders, or perfumes, or deoderant.  Do not shave 48 hours prior to surgery.   Do not bring valuables to the hospital.  [facility] is not responsible for any belongings or valuables.  Contacts, dentures or bridgework may not be worn into surgery.  Leave your suitcase in the car.  After surgery it may be brought to your room.  For patients admitted to the hospital, discharge time will be determined by your treatment team.  Patients discharged the day of surgery will not be allowed to drive home.    [facility]  Before surgery, you can play an important role.  Because skin is not sterile, your skin needs to be as free of germs as possible.  You can reduce the number of germs on you skin by washing with CHG (chlorahexidine gluconate) soap before surgery.  CHG is an antiseptic cleaner which kills germs and bonds with the skin to continue killing germs even after washing.  Please DO NOT use if you have an allergy to CHG or antibacterial soaps.  If your skin becomes reddened/irritated stop using the CHG and inform your nurse when you arrive at Short Stay.  Do not shave (including legs and underarms) for at least 48 hours prior to the first CHG shower.  You may shave your  face.  Please follow these instructions carefully:   1.  Shower with CHG Soap the night before surgery and the                                morning of Surgery.  2.  If you choose to wash your hair, wash your hair first as usual with your       normal shampoo.  3.  After you shampoo, rinse your hair and body thoroughly to remove the                      Shampoo.  4.  Use CHG as you would any other liquid soap.  You can apply chg directly       to the skin and wash gently with scrungie or a clean washcloth.  5.  Apply the CHG Soap to your body ONLY FROM THE NECK DOWN.        Do not use on open wounds or open sores.  Avoid contact with your eyes,       ears, mouth and genitals (private parts).  Wash genitals (private parts)       with your normal soap.  6.  Wash thoroughly, paying special attention to the area  where your surgery        will be performed.  7.  Thoroughly rinse your body with warm water from the neck down.  8.  DO NOT shower/wash with your normal soap after using and rinsing off       the CHG Soap.  9.  Pat yourself dry with a clean towel.            10.  Wear clean pajamas.            11.  Place clean sheets on your bed the night of your first shower and do not        sleep with pets.  Day of Surgery  Do not apply any lotions/deoderants the morning of surgery.  Please wear clean clothes to the hospital/surgery center.    Please read over the following fact sheets that you were given. Coughing and Deep Breathing and MRSA Information

## 2016-06-25 ENCOUNTER — Encounter (HOSPITAL_COMMUNITY)
Admission: RE | Admit: 2016-06-25 | Discharge: 2016-06-25 | Disposition: A | Discharge status: Home / Self Care | Payer: PPO | Source: Ambulatory Visit | Attending: Orthopedic Surgery | Admitting: Orthopedic Surgery

## 2016-06-25 ENCOUNTER — Encounter (HOSPITAL_COMMUNITY): Payer: Self-pay

## 2016-06-25 DIAGNOSIS — Z01812 Encounter for preprocedural laboratory examination: Secondary | ICD-10-CM | POA: Insufficient documentation

## 2016-06-25 HISTORY — DX: Pain in unspecified joint: M25.50

## 2016-06-25 HISTORY — DX: Personal history of other diseases of the nervous system and sense organs: Z86.69

## 2016-06-25 HISTORY — DX: Effusion, unspecified joint: M25.40

## 2016-06-25 HISTORY — DX: Dorsalgia, unspecified: M54.9

## 2016-06-25 HISTORY — DX: Pneumonia, unspecified organism: J18.9

## 2016-06-25 HISTORY — DX: Unspecified cataract: H26.9

## 2016-06-25 HISTORY — DX: Personal history of colonic polyps: Z86.010

## 2016-06-25 HISTORY — DX: Personal history of colon polyps, unspecified: Z86.0100

## 2016-06-25 LAB — URINALYSIS, ROUTINE W REFLEX MICROSCOPIC
Bilirubin Urine: NEGATIVE
Glucose, UA: NEGATIVE mg/dL
Hgb urine dipstick: NEGATIVE
KETONES UR: NEGATIVE mg/dL
LEUKOCYTES UA: NEGATIVE
NITRITE: NEGATIVE
PROTEIN: NEGATIVE mg/dL
Specific Gravity, Urine: 1.024 (ref 1.005–1.030)
pH: 6 (ref 5.0–8.0)

## 2016-06-25 LAB — COMPREHENSIVE METABOLIC PANEL
ALT: 20 U/L (ref 14–54)
AST: 26 U/L (ref 15–41)
Albumin: 4.4 g/dL (ref 3.5–5.0)
Alkaline Phosphatase: 69 U/L (ref 38–126)
Anion gap: 9 (ref 5–15)
BUN: 10 mg/dL (ref 6–20)
CHLORIDE: 104 mmol/L (ref 101–111)
CO2: 24 mmol/L (ref 22–32)
Calcium: 9.6 mg/dL (ref 8.9–10.3)
Creatinine, Ser: 0.54 mg/dL (ref 0.44–1.00)
Glucose, Bld: 142 mg/dL — ABNORMAL HIGH (ref 65–99)
POTASSIUM: 3.3 mmol/L — AB (ref 3.5–5.1)
SODIUM: 137 mmol/L (ref 135–145)
Total Bilirubin: 0.4 mg/dL (ref 0.3–1.2)
Total Protein: 7.6 g/dL (ref 6.5–8.1)

## 2016-06-25 LAB — CBC WITH DIFFERENTIAL/PLATELET
BASOS ABS: 0 10*3/uL (ref 0.0–0.1)
Basophils Relative: 0 %
EOS ABS: 0.3 10*3/uL (ref 0.0–0.7)
EOS PCT: 3 %
HCT: 41.2 % (ref 36.0–46.0)
Hemoglobin: 13.5 g/dL (ref 12.0–15.0)
LYMPHS ABS: 2.3 10*3/uL (ref 0.7–4.0)
LYMPHS PCT: 28 %
MCH: 30.4 pg (ref 26.0–34.0)
MCHC: 32.8 g/dL (ref 30.0–36.0)
MCV: 92.8 fL (ref 78.0–100.0)
MONO ABS: 0.8 10*3/uL (ref 0.1–1.0)
Monocytes Relative: 10 %
Neutro Abs: 4.7 10*3/uL (ref 1.7–7.7)
Neutrophils Relative %: 59 %
PLATELETS: 294 10*3/uL (ref 150–400)
RBC: 4.44 MIL/uL (ref 3.87–5.11)
RDW: 13.2 % (ref 11.5–15.5)
WBC: 8.1 10*3/uL (ref 4.0–10.5)

## 2016-06-25 LAB — SURGICAL PCR SCREEN
MRSA, PCR: NEGATIVE
Staphylococcus aureus: NEGATIVE

## 2016-06-25 LAB — PROTIME-INR
INR: 1.03
PROTHROMBIN TIME: 13.5 s (ref 11.4–15.2)

## 2016-06-25 LAB — APTT: APTT: 31 s (ref 24–36)

## 2016-06-25 MED ORDER — CHLORHEXIDINE GLUCONATE 4 % EX LIQD: 60.0000 mL | Freq: Once | CUTANEOUS | Status: DC

## 2016-06-25 NOTE — Progress Notes (Signed)
Anesthesia Chart Review: Pt is 75 year old female scheduled for right knee open synovectomy with poly exchange on 07/05/16 by Dr. Ronnie Derby. DX patellar clunk.  PMH includes: HTN, hyperlipidemia, never smoker, hysterectomy, bilateral TKA '09 with failed left TKA s/p synovectomy with poly exchange 07/21/15. BMI 33.   PCP is Dr. Melinda Crutch. Cardiologist is Dr. Ena Dawley, seen on 03/11/16 after observed "extra beats" on telemetry while undergoing colonoscopy. EKG was suggestive of possible ectopic atrial rhythm. 24 hour Holter monitor ordered to also evaluate for PVC burden. Echo was also ordered. Stress test ordered after Holter monitor showed frequent PVCs. 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 She declines sleep study, and would reconsider at her yearly follow-up visit.  Medications include: amlodipine-benazepril, ASA, hctz, simvastatin.   BP (!) 143/78   Pulse 74   Temp 36.9 C   Resp 20   Ht 5' 4.5" (1.638 m)   Wt 196 lb 11.2 oz (89.2 kg)   SpO2 97%   BMI 33.24 kg/m   04/07/16 CT Coronary: Aorta: Normal caliber. No dissection. Mild calcified plaque in the sino-tubular junction and in the aortic arch. Aortic Valve:  Trileaflet.  Normal thickness.  No calcifications. Coronary Arteries:  Normal coronary origin.  Right dominance. - Right coronary artery is a large caliber dominant vessel that gives rise to PLA and two PDAs. There are diffuse mild, predominantly calcified plaques in the proximal, mid and distal RCA with max stenosis 25-50%. - Left main is a very short vessel that bifurcates shortly after origin into LAC and LCX arteries. There is mild calcified plaque with associated stenosis 25-50% stenosis. - LAD is a large caliber vessel that gives rise to three diagonal branches and wraps around the apex. Proximal LAD has a long diffuse predominantly calcified plaque with maximum stenosis 25-50%. Mid LAD has a focal mild  calcified plaque with stenosis 25-50%. Distal LAD has a short segment of intramyocardial bridging with moderate stenosis, afterwards there is no significant stenosis. - The first diagonal artery is a very small vessel with moderate non-calcified in its ostium. - The second diagonal artery has a mild calcified ostial plaque with associated stenosis 25-50% stenosis. - The third diagonal artery has no obvious plaque. - LCX artery is a medium size non-dominant artery that gives rise to one obtuse marginal branch. There is minimal plaque. IMPRESSION: 1. Coronary calcium score of 681. This was 76 percentile for age and sex matched control. 2. Normal coronary origin.  Right dominance. 3. 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 An aggressive medical management is recommended. Ena Dawley  03/31/16 Nuclear stress test:  Nuclear stress EF: 69%.  There was no ST segment deviation noted during stress.  This is a low risk study.  The left ventricular ejection fraction is hyperdynamic (>65%). Small area of mild apical ischemia No infarction EF 69%. (A coronary CT was recommended.)  03/12/16 Echo: Study Conclusions - Left ventricle: The cavity size was normal. Systolic function was   normal. The estimated ejection fraction was in the range of 60%   to 65%. Wall motion was normal; there were no regional wall   motion abnormalities. Doppler parameters are consistent with   abnormal left ventricular relaxation (grade 1 diastolic   dysfunction). Doppler parameters are consistent with elevated   ventricular end-diastolic filling pressure. - Aortic valve: Trileaflet; normal thickness leaflets. There was no   regurgitation. - Aortic  root: The aortic root was normal in size. - Mitral valve: Calcified annulus. Mildly thickened leaflets .   There was mild regurgitation. - Left atrium: The atrium was normal in size. - Right ventricle: Systolic function was  normal. - Tricuspid valve: There was mild regurgitation. - Pulmonary arteries: Systolic pressure was mildly increased. PA   peak pressure: 40 mm Hg (S). - Inferior vena cava: The vessel was normal in size. - Pericardium, extracardiac: There was no pericardial effusion.  03/12/16 24 hour Holter monitor:   Sinus bradycardia to sinus rhythm.   Frequent PVC, few couplets.   Infrequent PACs and a very short atrial tachycardia. (Per Dr. Meda Coffee: "No therapy needed for the ectopic beats, as they are not too frequent and LVEF is normal. However, I would suggest a nuclear stress test to evaluate for ischemia. If negative we will just follow in 1 year.")   03/11/16 EKG: SR, PVCs. LAD. Poor r wave progression.  07/15/15 CXR: IMPRESSION: No acute cardiopulmonary disease.   Preoperative labs reviewed.   Recent cardiac work-up with medical therapy recommended. If no acute changes then I would anticipate that she could proceed as planned.  George Hugh [facility]/Anesthesiology Phone [phone] 06/25/2016 5:56 PM

## 2016-06-25 NOTE — Progress Notes (Addendum)
Cardiologist is Dr.Katrina Nelson,sees on a yrly basis.Last visit 03/11/16  Medical Md is Dr.Charles Harle Battiest at [place]  Echo and stress test reports in epic from 2017  Heart cath denies  EKG in epic from 03-11-16  CXR last one 07-15-15 report in epic

## 2016-07-02 MED ORDER — TRANEXAMIC ACID 1000 MG/10ML IV SOLN
1000.0000 mg | INTRAVENOUS | Status: AC
  Administered 2016-07-05: 1000 mg via INTRAVENOUS
  Filled 2016-07-02: qty 10

## 2016-07-05 ENCOUNTER — Inpatient Hospital Stay (HOSPITAL_COMMUNITY)
Admission: RE | Admit: 2016-07-05 | Discharge: 2016-07-06 | DRG: 464 | Disposition: A | Discharge status: Home Health | Payer: PPO | Source: Ambulatory Visit | Attending: Orthopedic Surgery | Admitting: Orthopedic Surgery

## 2016-07-05 ENCOUNTER — Encounter (HOSPITAL_COMMUNITY)
Admission: RE | Disposition: A | Discharge status: Home Health | Payer: Self-pay | Source: Ambulatory Visit | Attending: Orthopedic Surgery

## 2016-07-05 ENCOUNTER — Inpatient Hospital Stay (HOSPITAL_COMMUNITY): Payer: PPO | Admitting: Vascular Surgery

## 2016-07-05 ENCOUNTER — Inpatient Hospital Stay (HOSPITAL_COMMUNITY): Payer: PPO | Admitting: Anesthesiology

## 2016-07-05 DIAGNOSIS — Y838 Other surgical procedures as the cause of abnormal reaction of the patient, or of later complication, without mention of misadventure at the time of the procedure: Secondary | ICD-10-CM | POA: Diagnosis present

## 2016-07-05 DIAGNOSIS — K5901 Slow transit constipation: Secondary | ICD-10-CM | POA: Diagnosis not present

## 2016-07-05 DIAGNOSIS — Z96653 Presence of artificial knee joint, bilateral: Secondary | ICD-10-CM | POA: Diagnosis present

## 2016-07-05 DIAGNOSIS — E7800 Pure hypercholesterolemia, unspecified: Secondary | ICD-10-CM | POA: Diagnosis not present

## 2016-07-05 DIAGNOSIS — Z86010 Personal history of colonic polyps: Secondary | ICD-10-CM

## 2016-07-05 DIAGNOSIS — I10 Essential (primary) hypertension: Secondary | ICD-10-CM | POA: Diagnosis present

## 2016-07-05 DIAGNOSIS — T8489XA Other specified complication of internal orthopedic prosthetic devices, implants and grafts, initial encounter: Principal | ICD-10-CM | POA: Diagnosis present

## 2016-07-05 DIAGNOSIS — T84099A Other mechanical complication of unspecified internal joint prosthesis, initial encounter: Secondary | ICD-10-CM | POA: Diagnosis not present

## 2016-07-05 DIAGNOSIS — K219 Gastro-esophageal reflux disease without esophagitis: Secondary | ICD-10-CM | POA: Diagnosis present

## 2016-07-05 DIAGNOSIS — D62 Acute posthemorrhagic anemia: Secondary | ICD-10-CM | POA: Diagnosis not present

## 2016-07-05 DIAGNOSIS — Z90710 Acquired absence of both cervix and uterus: Secondary | ICD-10-CM | POA: Diagnosis not present

## 2016-07-05 DIAGNOSIS — G8918 Other acute postprocedural pain: Secondary | ICD-10-CM | POA: Diagnosis not present

## 2016-07-05 DIAGNOSIS — T84019A Broken internal joint prosthesis, unspecified site, initial encounter: Secondary | ICD-10-CM | POA: Diagnosis not present

## 2016-07-05 DIAGNOSIS — M24661 Ankylosis, right knee: Secondary | ICD-10-CM | POA: Diagnosis not present

## 2016-07-05 DIAGNOSIS — M25561 Pain in right knee: Secondary | ICD-10-CM | POA: Diagnosis not present

## 2016-07-05 DIAGNOSIS — K589 Irritable bowel syndrome without diarrhea: Secondary | ICD-10-CM | POA: Diagnosis present

## 2016-07-05 DIAGNOSIS — Z96659 Presence of unspecified artificial knee joint: Secondary | ICD-10-CM

## 2016-07-05 DIAGNOSIS — M25861 Other specified joint disorders, right knee: Secondary | ICD-10-CM | POA: Diagnosis not present

## 2016-07-05 HISTORY — PX: TOTAL KNEE REVISION: SHX996

## 2016-07-05 SURGERY — TOTAL KNEE REVISION
Anesthesia: Regional | Site: Knee | Laterality: Right

## 2016-07-05 MED ORDER — DEXAMETHASONE SODIUM PHOSPHATE 10 MG/ML IJ SOLN
10.0000 mg | Freq: Once | INTRAMUSCULAR | Status: AC
  Administered 2016-07-06: 10 mg via INTRAVENOUS
  Filled 2016-07-05: qty 1

## 2016-07-05 MED ORDER — LACTATED RINGERS IV SOLN
INTRAVENOUS | Status: DC
  Administered 2016-07-05: 50 mL/h via INTRAVENOUS
  Administered 2016-07-05: 11:00:00 via INTRAVENOUS

## 2016-07-05 MED ORDER — PROPOFOL 10 MG/ML IV BOLUS
INTRAVENOUS | Status: DC | PRN
  Administered 2016-07-05 (×2): 10 mg via INTRAVENOUS

## 2016-07-05 MED ORDER — ALUM & MAG HYDROXIDE-SIMETH 200-200-20 MG/5ML PO SUSP: 30.0000 mL | ORAL | Status: DC | PRN

## 2016-07-05 MED ORDER — CEFAZOLIN IN D5W 1 GM/50ML IV SOLN
1.0000 g | Freq: Four times a day (QID) | INTRAVENOUS | Status: AC
  Administered 2016-07-05 (×2): 1 g via INTRAVENOUS
  Filled 2016-07-05 (×2): qty 50

## 2016-07-05 MED ORDER — METHOCARBAMOL 500 MG PO TABS
500.0000 mg | ORAL_TABLET | Freq: Four times a day (QID) | ORAL | Status: DC | PRN
  Administered 2016-07-05: 500 mg via ORAL
  Filled 2016-07-05: qty 1

## 2016-07-05 MED ORDER — FENTANYL CITRATE (PF) 100 MCG/2ML IJ SOLN
INTRAMUSCULAR | Status: AC
  Filled 2016-07-05: qty 2

## 2016-07-05 MED ORDER — DIPHENHYDRAMINE HCL 12.5 MG/5ML PO ELIX
12.5000 mg | ORAL_SOLUTION | ORAL | Status: DC | PRN
Start: 2016-07-05 — End: 2016-07-06

## 2016-07-05 MED ORDER — BUPIVACAINE-EPINEPHRINE 0.25% -1:200000 IJ SOLN
INTRAMUSCULAR | Status: DC | PRN
  Administered 2016-07-05: 30 mL

## 2016-07-05 MED ORDER — AMLODIPINE BESY-BENAZEPRIL HCL 5-40 MG PO CAPS: 1.0000 | ORAL_CAPSULE | Freq: Every day | ORAL | Status: DC

## 2016-07-05 MED ORDER — PROPOFOL 500 MG/50ML IV EMUL
INTRAVENOUS | Status: DC | PRN
  Administered 2016-07-05: 50 ug/kg/min via INTRAVENOUS

## 2016-07-05 MED ORDER — FENTANYL CITRATE (PF) 100 MCG/2ML IJ SOLN
INTRAMUSCULAR | Status: AC
  Administered 2016-07-05: 50 ug
  Filled 2016-07-05: qty 2

## 2016-07-05 MED ORDER — GABAPENTIN 300 MG PO CAPS
300.0000 mg | ORAL_CAPSULE | Freq: Three times a day (TID) | ORAL | Status: DC
  Administered 2016-07-05 – 2016-07-06 (×3): 300 mg via ORAL
  Filled 2016-07-05 (×3): qty 1

## 2016-07-05 MED ORDER — HYDROCODONE-ACETAMINOPHEN 7.5-325 MG PO TABS: 1.0000 | ORAL_TABLET | Freq: Once | ORAL | Status: DC | PRN

## 2016-07-05 MED ORDER — METOCLOPRAMIDE HCL 5 MG/ML IJ SOLN: 5.0000 mg | Freq: Three times a day (TID) | INTRAMUSCULAR | Status: DC | PRN

## 2016-07-05 MED ORDER — PHENYLEPHRINE HCL 10 MG/ML IJ SOLN
INTRAMUSCULAR | Status: DC | PRN
  Administered 2016-07-05 (×9): 80 ug via INTRAVENOUS

## 2016-07-05 MED ORDER — MENTHOL 3 MG MT LOZG: 1.0000 | LOZENGE | OROMUCOSAL | Status: DC | PRN

## 2016-07-05 MED ORDER — PHENOL 1.4 % MT LIQD: 1.0000 | OROMUCOSAL | Status: DC | PRN

## 2016-07-05 MED ORDER — BUPIVACAINE HCL (PF) 0.75 % IJ SOLN
INTRAMUSCULAR | Status: DC | PRN
  Administered 2016-07-05: 2 mL via INTRATHECAL

## 2016-07-05 MED ORDER — ONDANSETRON HCL 4 MG PO TABS: 4.0000 mg | ORAL_TABLET | Freq: Four times a day (QID) | ORAL | Status: DC | PRN

## 2016-07-05 MED ORDER — PROPOFOL 10 MG/ML IV BOLUS
INTRAVENOUS | Status: AC
  Filled 2016-07-05: qty 20

## 2016-07-05 MED ORDER — HYDROMORPHONE HCL 1 MG/ML IJ SOLN: 1.0000 mg | INTRAMUSCULAR | Status: DC | PRN

## 2016-07-05 MED ORDER — ZOLPIDEM TARTRATE 5 MG PO TABS: 5.0000 mg | ORAL_TABLET | Freq: Every evening | ORAL | Status: DC | PRN

## 2016-07-05 MED ORDER — DIPHENHYDRAMINE HCL 50 MG/ML IJ SOLN
INTRAMUSCULAR | Status: DC | PRN
  Administered 2016-07-05: 25 mg via INTRAVENOUS

## 2016-07-05 MED ORDER — SODIUM CHLORIDE 0.9 % IJ SOLN
INTRAMUSCULAR | Status: DC | PRN
  Administered 2016-07-05: 20 mL via INTRAVENOUS

## 2016-07-05 MED ORDER — HYDROMORPHONE HCL 1 MG/ML IJ SOLN
INTRAMUSCULAR | Status: AC
  Filled 2016-07-05: qty 1

## 2016-07-05 MED ORDER — LIDOCAINE HCL (CARDIAC) 20 MG/ML IV SOLN
INTRAVENOUS | Status: DC | PRN
  Administered 2016-07-05: 50 mg via INTRATRACHEAL

## 2016-07-05 MED ORDER — ONDANSETRON HCL 4 MG/2ML IJ SOLN: 4.0000 mg | Freq: Four times a day (QID) | INTRAMUSCULAR | Status: DC | PRN

## 2016-07-05 MED ORDER — TRAMADOL HCL 50 MG PO TABS
50.0000 mg | ORAL_TABLET | Freq: Four times a day (QID) | ORAL | Status: DC
  Administered 2016-07-05: 50 mg via ORAL
  Administered 2016-07-06 (×2): 100 mg via ORAL
  Administered 2016-07-06: 50 mg via ORAL
  Filled 2016-07-05: qty 1
  Filled 2016-07-05: qty 2
  Filled 2016-07-05: qty 1
  Filled 2016-07-05: qty 2

## 2016-07-05 MED ORDER — HYDROCHLOROTHIAZIDE 25 MG PO TABS
25.0000 mg | ORAL_TABLET | Freq: Every day | ORAL | Status: DC
  Administered 2016-07-05 – 2016-07-06 (×2): 25 mg via ORAL
  Filled 2016-07-05 (×2): qty 1

## 2016-07-05 MED ORDER — SODIUM CHLORIDE 0.9 % IV SOLN: INTRAVENOUS | Status: DC

## 2016-07-05 MED ORDER — ACETAMINOPHEN 500 MG PO TABS
1000.0000 mg | ORAL_TABLET | Freq: Once | ORAL | Status: AC
  Administered 2016-07-05: 1000 mg via ORAL
  Filled 2016-07-05: qty 2

## 2016-07-05 MED ORDER — OXYCODONE HCL 5 MG PO TABS
5.0000 mg | ORAL_TABLET | ORAL | Status: DC | PRN
  Administered 2016-07-05 (×2): 10 mg via ORAL
  Filled 2016-07-05 (×2): qty 2

## 2016-07-05 MED ORDER — BUPIVACAINE LIPOSOME 1.3 % IJ SUSP
20.0000 mL | Freq: Once | INTRAMUSCULAR | Status: AC
  Administered 2016-07-05: 20 mL
  Filled 2016-07-05: qty 20

## 2016-07-05 MED ORDER — BENAZEPRIL HCL 20 MG PO TABS
40.0000 mg | ORAL_TABLET | Freq: Every day | ORAL | Status: DC
  Filled 2016-07-05: qty 2

## 2016-07-05 MED ORDER — ASPIRIN EC 325 MG PO TBEC
325.0000 mg | DELAYED_RELEASE_TABLET | Freq: Two times a day (BID) | ORAL | Status: DC
  Administered 2016-07-05 – 2016-07-06 (×2): 325 mg via ORAL
  Filled 2016-07-05 (×2): qty 1

## 2016-07-05 MED ORDER — HYOSCYAMINE SULFATE 0.125 MG SL SUBL
0.1250 mg | SUBLINGUAL_TABLET | SUBLINGUAL | Status: DC | PRN
  Filled 2016-07-05: qty 1

## 2016-07-05 MED ORDER — ONDANSETRON HCL 4 MG/2ML IJ SOLN: 4.0000 mg | Freq: Once | INTRAMUSCULAR | Status: DC | PRN

## 2016-07-05 MED ORDER — ACETAMINOPHEN 325 MG PO TABS: 650.0000 mg | ORAL_TABLET | Freq: Four times a day (QID) | ORAL | Status: DC | PRN

## 2016-07-05 MED ORDER — CEFAZOLIN SODIUM-DEXTROSE 2-4 GM/100ML-% IV SOLN
2.0000 g | INTRAVENOUS | Status: AC
  Administered 2016-07-05: 2 g via INTRAVENOUS
  Filled 2016-07-05: qty 100

## 2016-07-05 MED ORDER — METOCLOPRAMIDE HCL 5 MG PO TABS
5.0000 mg | ORAL_TABLET | Freq: Three times a day (TID) | ORAL | Status: DC | PRN
Start: 2016-07-05 — End: 2016-07-06

## 2016-07-05 MED ORDER — OXYCODONE HCL 5 MG PO TABS
ORAL_TABLET | ORAL | Status: AC
Start: 2016-07-05 — End: 2016-07-06
  Filled 2016-07-05: qty 2

## 2016-07-05 MED ORDER — FENTANYL CITRATE (PF) 250 MCG/5ML IJ SOLN
INTRAMUSCULAR | Status: DC | PRN
  Administered 2016-07-05 (×2): 50 ug via INTRAVENOUS

## 2016-07-05 MED ORDER — ROSUVASTATIN CALCIUM 10 MG PO TABS
20.0000 mg | ORAL_TABLET | Freq: Every day | ORAL | Status: DC
  Administered 2016-07-05: 20 mg via ORAL
  Filled 2016-07-05: qty 2

## 2016-07-05 MED ORDER — DEXTROSE 5 % IV SOLN
500.0000 mg | Freq: Four times a day (QID) | INTRAVENOUS | Status: DC | PRN
  Filled 2016-07-05: qty 5

## 2016-07-05 MED ORDER — HYDROMORPHONE HCL 1 MG/ML IJ SOLN
0.2500 mg | INTRAMUSCULAR | Status: DC | PRN
  Administered 2016-07-05 (×2): 0.5 mg via INTRAVENOUS

## 2016-07-05 MED ORDER — BUPIVACAINE-EPINEPHRINE (PF) 0.25% -1:200000 IJ SOLN
INTRAMUSCULAR | Status: AC
  Filled 2016-07-05: qty 30

## 2016-07-05 MED ORDER — MIDAZOLAM HCL 2 MG/2ML IJ SOLN
INTRAMUSCULAR | Status: AC
  Filled 2016-07-05: qty 2

## 2016-07-05 MED ORDER — AMLODIPINE BESYLATE 5 MG PO TABS
5.0000 mg | ORAL_TABLET | Freq: Every day | ORAL | Status: DC
  Filled 2016-07-05 (×2): qty 1

## 2016-07-05 MED ORDER — ROPIVACAINE HCL 5 MG/ML IJ SOLN
INTRAMUSCULAR | Status: DC | PRN
  Administered 2016-07-05: 25 mL via PERINEURAL

## 2016-07-05 MED ORDER — DOCUSATE SODIUM 100 MG PO CAPS
100.0000 mg | ORAL_CAPSULE | Freq: Two times a day (BID) | ORAL | Status: DC
  Administered 2016-07-05 – 2016-07-06 (×3): 100 mg via ORAL
  Filled 2016-07-05 (×3): qty 1

## 2016-07-05 MED ORDER — SODIUM CHLORIDE 0.9 % IV SOLN
INTRAVENOUS | Status: DC
  Administered 2016-07-05: 16:00:00 via INTRAVENOUS

## 2016-07-05 MED ORDER — BISACODYL 5 MG PO TBEC: 5.0000 mg | DELAYED_RELEASE_TABLET | Freq: Every day | ORAL | Status: DC | PRN

## 2016-07-05 MED ORDER — TRANEXAMIC ACID 1000 MG/10ML IV SOLN
1000.0000 mg | Freq: Once | INTRAVENOUS | Status: AC
  Administered 2016-07-05: 1000 mg via INTRAVENOUS
  Filled 2016-07-05: qty 10

## 2016-07-05 MED ORDER — FLEET ENEMA 7-19 GM/118ML RE ENEM: 1.0000 | ENEMA | Freq: Once | RECTAL | Status: DC | PRN

## 2016-07-05 MED ORDER — SODIUM CHLORIDE 0.9 % IR SOLN
Status: DC | PRN
  Administered 2016-07-05: 3000 mL

## 2016-07-05 MED ORDER — ACETAMINOPHEN 650 MG RE SUPP: 650.0000 mg | Freq: Four times a day (QID) | RECTAL | Status: DC | PRN

## 2016-07-05 MED ORDER — SENNOSIDES-DOCUSATE SODIUM 8.6-50 MG PO TABS: 1.0000 | ORAL_TABLET | Freq: Every evening | ORAL | Status: DC | PRN

## 2016-07-05 MED ORDER — CELECOXIB 200 MG PO CAPS
200.0000 mg | ORAL_CAPSULE | Freq: Two times a day (BID) | ORAL | Status: DC
  Administered 2016-07-05 – 2016-07-06 (×3): 200 mg via ORAL
  Filled 2016-07-05 (×3): qty 1

## 2016-07-05 MED ORDER — MIDAZOLAM HCL 2 MG/2ML IJ SOLN
INTRAMUSCULAR | Status: AC
  Administered 2016-07-05: 1 mg
  Filled 2016-07-05: qty 2

## 2016-07-05 MED ORDER — 0.9 % SODIUM CHLORIDE (POUR BTL) OPTIME
TOPICAL | Status: DC | PRN
  Administered 2016-07-05: 1000 mL

## 2016-07-05 SURGICAL SUPPLY — 79 items
BANDAGE ACE 6X5 VEL STRL LF (GAUZE/BANDAGES/DRESSINGS) ×2 IMPLANT
BANDAGE ESMARK 6X9 LF (GAUZE/BANDAGES/DRESSINGS) ×1 IMPLANT
BLADE SAGITTAL 13X1.27X60 (BLADE) ×2 IMPLANT
BLADE SAW SGTL 13X75X1.27 (BLADE) ×2 IMPLANT
BLADE SAW SGTL 83.5X18.5 (BLADE) ×2 IMPLANT
BLADE SAW SGTL NAR THIN XSHT (BLADE) ×2 IMPLANT
BLADE SURG 10 STRL SS (BLADE) ×6 IMPLANT
BNDG ESMARK 6X9 LF (GAUZE/BANDAGES/DRESSINGS) ×2
BOWL SMART MIX CTS (DISPOSABLE) IMPLANT
CLSR STERI-STRIP ANTIMIC 1/2X4 (GAUZE/BANDAGES/DRESSINGS) ×4 IMPLANT
COVER SURGICAL LIGHT HANDLE (MISCELLANEOUS) ×2 IMPLANT
CUFF TOURNIQUET SINGLE 34IN LL (TOURNIQUET CUFF) IMPLANT
DECANTER SPIKE VIAL GLASS SM (MISCELLANEOUS) ×2 IMPLANT
DRAPE EXTREMITY T 121X128X90 (DRAPE) ×2 IMPLANT
DRAPE IMP U-DRAPE 54X76 (DRAPES) ×2 IMPLANT
DRAPE INCISE IOBAN 66X45 STRL (DRAPES) ×4 IMPLANT
DRAPE ORTHO SPLIT 77X108 STRL (DRAPES) ×1
DRAPE PROXIMA HALF (DRAPES) ×2 IMPLANT
DRAPE SURG ORHT 6 SPLT 77X108 (DRAPES) ×1 IMPLANT
DRAPE U-SHAPE 47X51 STRL (DRAPES) ×2 IMPLANT
DRSG ADAPTIC 3X8 NADH LF (GAUZE/BANDAGES/DRESSINGS) ×2 IMPLANT
DRSG AQUACEL AG ADV 3.5X10 (GAUZE/BANDAGES/DRESSINGS) ×2 IMPLANT
DRSG AQUACEL AG ADV 3.5X14 (GAUZE/BANDAGES/DRESSINGS) ×2 IMPLANT
DRSG PAD ABDOMINAL 8X10 ST (GAUZE/BANDAGES/DRESSINGS) ×2 IMPLANT
DURAPREP 26ML APPLICATOR (WOUND CARE) ×4 IMPLANT
ELECT REM PT RETURN 9FT ADLT (ELECTROSURGICAL) ×2
ELECTRODE REM PT RTRN 9FT ADLT (ELECTROSURGICAL) ×1 IMPLANT
EVACUATOR 1/8 PVC DRAIN (DRAIN) ×2 IMPLANT
FACESHIELD WRAPAROUND (MASK) ×2 IMPLANT
GAUZE SPONGE 4X4 12PLY STRL (GAUZE/BANDAGES/DRESSINGS) ×2 IMPLANT
GLOVE BIOGEL M 7.0 STRL (GLOVE) ×2 IMPLANT
GLOVE BIOGEL PI IND STRL 7.5 (GLOVE) IMPLANT
GLOVE BIOGEL PI IND STRL 8.5 (GLOVE) ×4 IMPLANT
GLOVE BIOGEL PI INDICATOR 7.5 (GLOVE)
GLOVE BIOGEL PI INDICATOR 8.5 (GLOVE) ×4
GLOVE BIOGEL PI ORTHO PRO SZ8 (GLOVE) ×1
GLOVE PI ORTHO PRO STRL SZ8 (GLOVE) ×1 IMPLANT
GLOVE SURG ORTHO 8.0 STRL STRW (GLOVE) ×12 IMPLANT
GOWN STRL REUS W/ TWL LRG LVL3 (GOWN DISPOSABLE) ×2 IMPLANT
GOWN STRL REUS W/ TWL XL LVL3 (GOWN DISPOSABLE) ×2 IMPLANT
GOWN STRL REUS W/TWL 2XL LVL3 (GOWN DISPOSABLE) ×2 IMPLANT
GOWN STRL REUS W/TWL LRG LVL3 (GOWN DISPOSABLE) ×2
GOWN STRL REUS W/TWL XL LVL3 (GOWN DISPOSABLE) ×4
HANDPIECE INTERPULSE COAX TIP (DISPOSABLE)
HOOD PEEL AWAY FACE SHEILD DIS (HOOD) ×8 IMPLANT
INSERT TIB LCS RP STD+ 12.5 (Knees) ×2 IMPLANT
KIT BASIN OR (CUSTOM PROCEDURE TRAY) ×2 IMPLANT
KIT ROOM TURNOVER OR (KITS) ×2 IMPLANT
MANIFOLD NEPTUNE II (INSTRUMENTS) ×2 IMPLANT
NEEDLE 18GX1X1/2 (RX/OR ONLY) (NEEDLE) IMPLANT
NEEDLE 22X1 1/2 (OR ONLY) (NEEDLE) ×4 IMPLANT
NS IRRIG 1000ML POUR BTL (IV SOLUTION) ×2 IMPLANT
PACK TOTAL JOINT (CUSTOM PROCEDURE TRAY) ×2 IMPLANT
PACK UNIVERSAL I (CUSTOM PROCEDURE TRAY) ×2 IMPLANT
PAD ARMBOARD 7.5X6 YLW CONV (MISCELLANEOUS) ×4 IMPLANT
PADDING CAST COTTON 6X4 STRL (CAST SUPPLIES) ×2 IMPLANT
SET HNDPC FAN SPRY TIP SCT (DISPOSABLE) IMPLANT
STAPLER VISISTAT 35W (STAPLE) ×2 IMPLANT
SUCTION FRAZIER HANDLE 10FR (MISCELLANEOUS) ×1
SUCTION TUBE FRAZIER 10FR DISP (MISCELLANEOUS) ×1 IMPLANT
SUT BONE WAX W31G (SUTURE) ×2 IMPLANT
SUT PDS AB 0 CT 36 (SUTURE) IMPLANT
SUT PDS AB 1 CT  36 (SUTURE)
SUT PDS AB 1 CT 36 (SUTURE) IMPLANT
SUT PDS AB 2-0 CT1 27 (SUTURE) IMPLANT
SUT VIC AB 0 CT1 27 (SUTURE) ×1
SUT VIC AB 0 CT1 27XBRD ANBCTR (SUTURE) ×1 IMPLANT
SUT VIC AB 0 CTB1 27 (SUTURE) IMPLANT
SUT VIC AB 1 CT1 27 (SUTURE) ×1
SUT VIC AB 1 CT1 27XBRD ANBCTR (SUTURE) ×1 IMPLANT
SUT VIC AB 2-0 CTB1 (SUTURE) IMPLANT
SWAB COLLECTION DEVICE MRSA (MISCELLANEOUS) ×2 IMPLANT
SYR 20CC LL (SYRINGE) ×4 IMPLANT
SYR CONTROL 10ML LL (SYRINGE) ×4 IMPLANT
TOWEL OR 17X24 6PK STRL BLUE (TOWEL DISPOSABLE) ×2 IMPLANT
TOWEL OR 17X26 10 PK STRL BLUE (TOWEL DISPOSABLE) ×2 IMPLANT
TRAY FOLEY CATH 16FRSI W/METER (SET/KITS/TRAYS/PACK) ×2 IMPLANT
TUBE ANAEROBIC SPECIMEN COL (MISCELLANEOUS) IMPLANT
WATER STERILE IRR 1000ML POUR (IV SOLUTION) ×6 IMPLANT

## 2016-07-05 NOTE — Anesthesia Procedure Notes (Addendum)
Anesthesia Regional Block:  Adductor canal block  Pre-Anesthetic Checklist: ,, timeout performed, Correct Patient, Correct Site, Correct Laterality, Correct Procedure, Correct Position, site marked, Risks and benefits discussed,  Surgical consent,  Pre-op evaluation,  At surgeon's request and post-op pain management  Laterality: Right  Prep: chloraprep       Needles:  Injection technique: Single-shot  Needle Type: Stimiplex     Needle Length: 5cm 5 cm Needle Gauge: 21 G    Additional Needles:  Procedures: ultrasound guided (picture in chart) Adductor canal block Narrative:  Injection made incrementally with aspirations every 5 mL.  Performed by: Personally  Anesthesiologist: Adelyn Roscher  Additional Notes: Risks, benefits and alternative to block explained extensively.  Patient tolerated procedure well, without complications.

## 2016-07-05 NOTE — Progress Notes (Signed)
Orthopedic Tech Progress Note Patient Details:  Andrea Farmer [birth year] [id number]  CPM Right Knee CPM Right Knee: On Right Knee Flexion (Degrees): 90 Right Knee Extension (Degrees): 0 Additional Comments: trapeze bar patient helper   [name] 07/05/2016, 12:23 PM Viewed order from doctor's order list

## 2016-07-05 NOTE — Anesthesia Preprocedure Evaluation (Signed)
Anesthesia Evaluation  Patient identified by MRN, date of birth, ID band Patient awake    Reviewed: Allergy & Precautions, NPO status , Patient's Chart, lab work & pertinent test results  Airway Mallampati: II  TM Distance: >3 FB Neck ROM: Full    Dental no notable dental hx.    Pulmonary neg pulmonary ROS,    Pulmonary exam normal breath sounds clear to auscultation       Cardiovascular hypertension, Pt. on medications Normal cardiovascular exam Rhythm:Regular Rate:Normal     Neuro/Psych negative neurological ROS  negative psych ROS   GI/Hepatic Neg liver ROS, GERD  ,  Endo/Other  negative endocrine ROS  Renal/GU negative Renal ROS  negative genitourinary   Musculoskeletal  (+) Arthritis ,   Abdominal   Peds negative pediatric ROS (+)  Hematology negative hematology ROS (+)   Anesthesia Other Findings   Reproductive/Obstetrics negative OB ROS                             Anesthesia Physical  Anesthesia Plan  ASA: II  Anesthesia Plan: Spinal and Regional   Post-op Pain Management:    Induction: Intravenous  Airway Management Planned: Simple Face Mask  Additional Equipment:   Intra-op Plan:   Post-operative Plan:   Informed Consent: I have reviewed the patients History and Physical, chart, labs and discussed the procedure including the risks, benefits and alternatives for the proposed anesthesia with the patient or authorized representative who has indicated his/her understanding and acceptance.   Dental advisory given  Plan Discussed with: CRNA and Surgeon  Anesthesia Plan Comments:         Anesthesia Quick Evaluation

## 2016-07-05 NOTE — Anesthesia Postprocedure Evaluation (Signed)
Anesthesia Post Note  Patient: Andrea Farmer  Procedure(s) Performed: Procedure(s) (LRB): TOTAL KNEE REVISION WITH SCAR DEBRIDEMENT/PATELLA REVISION WITH POLY EXCHANGE (Right)  Patient location during evaluation: PACU Anesthesia Type: Spinal and Regional Level of consciousness: oriented and awake and alert Pain management: pain level controlled Vital Signs Assessment: post-procedure vital signs reviewed and stable Respiratory status: spontaneous breathing, respiratory function stable and patient connected to nasal cannula oxygen Cardiovascular status: blood pressure returned to baseline and stable Postop Assessment: no headache and no backache Anesthetic complications: no    Last Vitals:  Vitals:   07/05/16 1200 07/05/16 1215  BP: 101/62 (!) 93/53  Pulse: (!) 53 (!) 51  Resp: 18 14  Temp:      Last Pain:  Vitals:   07/05/16 1143  TempSrc:   PainSc: 0-No pain    LLE Motor Response: Purposeful movement (slight movement of feet) (07/05/16 1215) LLE Sensation: Decreased;Tingling (07/05/16 1215) RLE Motor Response: Purposeful movement (slight movement of feet) (07/05/16 1215) RLE Sensation: Decreased;Tingling (07/05/16 1215) L Sensory Level: L4-Anterior knee, lower leg (07/05/16 1215) R Sensory Level: L4-Anterior knee, lower leg (07/05/16 1215)  Zenaida Deed

## 2016-07-05 NOTE — Progress Notes (Signed)
Received report from PACU nurse by phone.   Pt arrived to 5N in stable conditions on continuous pulse ox. CPM was on at 26.   Assisted with ambulating the pt to the bathroom and pt voided (unmeasureed). Tolerated ambulation well. VS were taken and WDL,   Pt denies any pain and will continue to monitor.   Paulla Fore, RN

## 2016-07-05 NOTE — Progress Notes (Signed)
Orthopedic Tech Progress Note Patient Details:  Andrea Farmer 1941/09/09 [id number] Ortho visit put on cpm at 1815 Patient ID: Andrea Farmer, female   DOB: 1941/07/21, 75 y.o.   MRN: [id number]   Braulio Bosch 07/05/2016, 6:18 PM

## 2016-07-05 NOTE — Transfer of Care (Signed)
Immediate Anesthesia Transfer of Care Note  Patient: Andrea Farmer  Procedure(s) Performed: Procedure(s): TOTAL KNEE REVISION WITH SCAR DEBRIDEMENT/PATELLA REVISION WITH POLY EXCHANGE (Right)  Patient Location: PACU  Anesthesia Type:Spinal  Level of Consciousness: awake, alert  and oriented  Airway & Oxygen Therapy: Patient Spontanous Breathing and Patient connected to nasal cannula oxygen  Post-op Assessment: Report given to RN and Post -op Vital signs reviewed and stable  Post vital signs: Reviewed and stable  Last Vitals:  Vitals:   07/05/16 0915 07/05/16 1143  BP: 112/61 102/64  Pulse: 60   Resp: 12 13  Temp:  36.4 C    Last Pain:  Vitals:   07/05/16 1143  TempSrc:   PainSc: 0-No pain      Patients Stated Pain Goal: 6 ([id number])  Complications: No apparent anesthesia complications

## 2016-07-05 NOTE — Anesthesia Procedure Notes (Signed)
Spinal  Staffing Anesthesiologist: Noah Lembke Performed: anesthesiologist  Preanesthetic Checklist Completed: patient identified, site marked, surgical consent, pre-op evaluation, timeout performed, IV checked, risks and benefits discussed and monitors and equipment checked Spinal Block Patient position: sitting Prep: DuraPrep Patient monitoring: heart rate, cardiac monitor, continuous pulse ox and blood pressure Approach: midline Location: L4-5 Injection technique: single-shot Needle Needle type: Pencan  Needle gauge: 24 G Assessment Sensory level: T6 Additional Notes Functioning IV was confirmed and monitors were applied. Sterile prep and drape, including hand hygiene, mask and sterile gloves were used. The patient was positioned and the spine was prepped. The skin was anesthetized with lidocaine.  Free flow of clear CSF was obtained prior to injecting local anesthetic into the CSF.  The spinal needle aspirated freely following injection.  The needle was carefully withdrawn.  The patient tolerated the procedure well. Consent was obtained prior to procedure with all questions answered and concerns addressed.  Andrea Pink, MD

## 2016-07-05 NOTE — H&P (Signed)
Andrea Farmer MRN:  [id number] DOB/SEX:  1941-10-10/female  CHIEF COMPLAINT:  Painful right Knee  HISTORY: Patient is a 75 y.o. female presented with a history of pain in the right knee. Onset of symptoms was gradual starting a few months ago with gradually worsening course since that time. Prior procedures on the knee include arthroplasty. Patient has been treated conservatively with over-the-counter NSAIDs and activity modification. Patient currently rates pain in the knee at 8 out of 10 with activity. There is no pain at night.  PAST MEDICAL HISTORY: Patient Active Problem List   Diagnosis Date Noted  . Abnormal computed tomography angiography (CTA) 04/08/2016  . Abnormal stress test 04/02/2016  . Cardiac ischemia 04/02/2016  . Pure hypercholesterolemia 03/11/2016  . Abdominal distension (gaseous) 03/11/2016  . Abdominal pain, epigastric 03/11/2016  . Environmental and seasonal allergies 03/11/2016  . Anal or rectal pain 03/11/2016  . Dyspepsia 03/11/2016  . Other abnormal glucose 03/11/2016  . Essential (primary) hypertension 03/11/2016  . Cardiac arrhythmia 03/11/2016  . Irritable bowel syndrome 03/11/2016  . Osteoarthritis of knee 03/11/2016  . Osteopenia 03/11/2016  . Pain in joint, lower leg 03/11/2016  . History of colonic polyps 03/11/2016  . Slow transit constipation 03/11/2016  . PVC (premature ventricular contraction) 03/11/2016  . SOB (shortness of breath) 03/11/2016  . 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 Hemorrhoid 12/07/2010   Past Medical History:  Diagnosis Date  . Acid reflux   . Back pain    arthritis  . Cataract    immature but doesn't know which eye  . High cholesterol    takes Crestor daily  . History of colon polyps    benign  . History of migraine   . Hypertension    takes Lotrel and HCTZ dailydaily  . Irritable bowel syndrome 03/11/2016  . Joint pain   . Joint swelling   . Osteoarthritis of knee  03/11/2016  . Pneumonia    30 plus yrs ago   Past Surgical History:  Procedure Laterality Date  . ABDOMINAL HYSTERECTOMY    . RECTAL SURGERY     x 3  . REPLACEMENT TOTAL KNEE BILATERAL    . SYNOVECTOMY Left 07/21/2015   Procedure: LEFT KNEE SYNOVECTOMY with POLY EXCHANGE;  Surgeon: Vickey Huger, MD;  Location: [facility];  Service: Orthopedics;  Laterality: Left;     MEDICATIONS:   No prescriptions prior to admission.    ALLERGIES:   Allergies  Allergen Reactions  . No Known Allergies Other (See Comments)    REVIEW OF SYSTEMS:  A comprehensive review of systems was negative except for: Musculoskeletal: positive for stiff joints   FAMILY HISTORY:   Family History  Problem Relation Age of Onset  . Cancer Mother   . Aneurysm Father     SOCIAL HISTORY:   Social History  Substance Use Topics  . Smoking status: Never Smoker  . Smokeless tobacco: Never Used  . Alcohol use Yes     Comment: WINE OCC     EXAMINATION:  Vital signs in last 24 hours:    There were no vitals taken for this visit.  General Appearance:    Alert, cooperative, no distress, appears stated age  Head:    Normocephalic, without obvious abnormality, atraumatic  Eyes:    PERRL, conjunctiva/corneas clear, EOM's intact, fundi    benign, both eyes  Ears:    Normal TM's and external ear canals, both ears  Nose:  Nares normal, septum midline, mucosa normal, no drainage    or sinus tenderness  Throat:   Lips, mucosa, and tongue normal; teeth and gums normal  Neck:   Supple, symmetrical, trachea midline, no adenopathy;    thyroid:  no enlargement/tenderness/nodules; no carotid   bruit or JVD  Back:     Symmetric, no curvature, ROM normal, no CVA tenderness  Lungs:     Clear to auscultation bilaterally, respirations unlabored  Chest Wall:    No tenderness or deformity   Heart:    Regular rate and rhythm, S1 and S2 normal, no murmur, rub   or gallop  Breast Exam:    No tenderness, masses, or nipple  abnormality  Abdomen:     Soft, non-tender, bowel sounds active all four quadrants,    no masses, no organomegaly  Genitalia:    Normal female without lesion, discharge or tenderness  Rectal:    Normal tone, no masses or tenderness;   guaiac negative stool  Extremities:   Extremities normal, atraumatic, no cyanosis or edema  Pulses:   2+ and symmetric all extremities  Skin:   Skin color, texture, turgor normal, no rashes or lesions  Lymph nodes:   Cervical, supraclavicular, and axillary nodes normal  Neurologic:   CNII-XII intact, normal strength, sensation and reflexes    throughout     Musculoskeletal:  ROM 0-120, Ligaments intact,  Imaging Review Plain radiographs demonstrate TKA of right knee. The overall alignment is neutral. The bone quality appears to be excellent for age and reported activity level.  Assessment/Plan: Patellar clunk syndrome, s/p tka right knee   The patient history, physical examination and imaging studies are consistent with patellar clunk syndrome and s/p tka of right knee. The patient has failed conservative treatment.  The clearance notes were reviewed.  After discussion with the patient it was felt that open synovectomy with poly exchange was indicated. The procedure,  risks, and benefits of total knee arthroplasty were presented and reviewed. The risks including but not limited to aseptic loosening, infection, blood clots, vascular injury, stiffness, patella tracking problems complications among others were discussed. The patient acknowledged the explanation, agreed to proceed with the plan. Donia Ast 07/05/2016, 6:30 AM

## 2016-07-06 ENCOUNTER — Encounter (HOSPITAL_COMMUNITY): Payer: Self-pay | Admitting: Orthopedic Surgery

## 2016-07-06 DIAGNOSIS — T84019A Broken internal joint prosthesis, unspecified site, initial encounter: Secondary | ICD-10-CM | POA: Diagnosis not present

## 2016-07-06 LAB — CBC
HCT: 37 % (ref 36.0–46.0)
Hemoglobin: 11.8 g/dL — ABNORMAL LOW (ref 12.0–15.0)
MCH: 30.1 pg (ref 26.0–34.0)
MCHC: 31.9 g/dL (ref 30.0–36.0)
MCV: 94.4 fL (ref 78.0–100.0)
PLATELETS: 266 10*3/uL (ref 150–400)
RBC: 3.92 MIL/uL (ref 3.87–5.11)
RDW: 13.5 % (ref 11.5–15.5)
WBC: 9.8 10*3/uL (ref 4.0–10.5)

## 2016-07-06 LAB — BASIC METABOLIC PANEL
ANION GAP: 8 (ref 5–15)
BUN: 6 mg/dL (ref 6–20)
CALCIUM: 8.9 mg/dL (ref 8.9–10.3)
CO2: 26 mmol/L (ref 22–32)
Chloride: 103 mmol/L (ref 101–111)
Creatinine, Ser: 0.64 mg/dL (ref 0.44–1.00)
GLUCOSE: 162 mg/dL — AB (ref 65–99)
POTASSIUM: 3.9 mmol/L (ref 3.5–5.1)
Sodium: 137 mmol/L (ref 135–145)

## 2016-07-06 MED ORDER — TRAMADOL HCL 50 MG PO TABS: 50.0000 mg | ORAL_TABLET | Freq: Four times a day (QID) | ORAL | 0 refills | Status: DC

## 2016-07-06 MED ORDER — ASPIRIN 325 MG PO TBEC: 325.0000 mg | DELAYED_RELEASE_TABLET | Freq: Two times a day (BID) | ORAL | 0 refills | Status: DC

## 2016-07-06 MED ORDER — OXYCODONE HCL 5 MG PO TABS: 5.0000 mg | ORAL_TABLET | ORAL | 0 refills | Status: DC | PRN

## 2016-07-06 MED ORDER — METHOCARBAMOL 500 MG PO TABS: 500.0000 mg | ORAL_TABLET | Freq: Four times a day (QID) | ORAL | 0 refills | Status: DC | PRN

## 2016-07-06 NOTE — Discharge Summary (Signed)
SPORTS MEDICINE & JOINT REPLACEMENT   Lara Mulch, MD   Carlyon Shadow, PA-C [place], [place], [place]  [address]                             [phone]  PATIENT ID: Andrea Farmer        MRN:  [id number]          DOB/AGE: 1941/08/30 / 75 y.o.    DISCHARGE SUMMARY  ADMISSION DATE:    07/05/2016 DISCHARGE DATE:   07/06/2016   ADMISSION DIAGNOSIS: Right knee failed total knee arthroplasty     DISCHARGE DIAGNOSIS:  Right knee failed total knee arthroplasty     ADDITIONAL DIAGNOSIS: Active Problems:   S/P revision of total knee  Past Medical History:  Diagnosis Date  . Acid reflux   . Back pain    arthritis  . Cataract    immature but doesn't know which eye  . High cholesterol    takes Crestor daily  . History of colon polyps    benign  . History of migraine   . Hypertension    takes Lotrel and HCTZ dailydaily  . Irritable bowel syndrome 03/11/2016  . Joint pain   . Joint swelling   . Osteoarthritis of knee 03/11/2016  . Pneumonia    30 plus yrs ago    PROCEDURE: Procedure(s): TOTAL KNEE REVISION WITH SCAR DEBRIDEMENT/PATELLA REVISION WITH POLY EXCHANGE on 07/05/2016  CONSULTS:    HISTORY:  See H&P in chart  HOSPITAL COURSE:  Andrea Farmer is a 75 y.o. admitted on 07/05/2016 and found to have a diagnosis of Right knee failed total knee arthroplasty .  After appropriate laboratory studies were obtained  they were taken to the operating room on 07/05/2016 and underwent Procedure(s): TOTAL KNEE REVISION WITH SCAR DEBRIDEMENT/PATELLA REVISION WITH POLY EXCHANGE.   They were given perioperative antibiotics:  Anti-infectives    Start     Dose/Rate Route Frequency Ordered Stop   07/05/16 1530  ceFAZolin (ANCEF) IVPB 1 g/50 mL premix     1 g 100 mL/hr over 30 Minutes Intravenous Every 6 hours 07/05/16 1518 07/05/16 2138   07/05/16 0801  ceFAZolin (ANCEF) IVPB 2g/100 mL premix     2 g 200 mL/hr over 30 Minutes Intravenous On call to O.R. 07/05/16 [id number] 07/05/16 0940     .  Patient given tranexamic acid IV or topical and exparel intra-operatively.  Tolerated the procedure well.    POD# 1: Vital signs were stable.  Patient denied Chest pain, shortness of breath, or calf pain.  Patient was started on Lovenox 30 mg subcutaneously twice daily at 8am.  Consults to PT, OT, and care management were made.  The patient was weight bearing as tolerated.  CPM was placed on the operative leg 0-90 degrees for 6-8 hours a day. When out of the CPM, patient was placed in the foam block to achieve full extension. Incentive spirometry was taught.  Dressing was changed.       POD #2, Continued  PT for ambulation and exercise program.  IV saline locked.  O2 discontinued.    The remainder of the hospital course was dedicated to ambulation and strengthening.   The patient was discharged on 1 Day Post-Op in  Good condition.  Blood products given:none  DIAGNOSTIC STUDIES: Recent vital signs: Patient Vitals for the past 24 hrs:  BP Temp Temp src Pulse Resp SpO2  07/06/16 0940  121/66 - - 67 - -  07/06/16 0528 (!) 112/54 98.3 F (36.8 C) Oral 67 16 95 %  07/06/16 0120 122/76 98.7 F (37.1 C) Oral 84 16 94 %  07/05/16 2214 (!) 119/51 99.8 F (37.7 C) Oral 74 16 97 %  07/05/16 1617 116/80 97.9 F (36.6 C) Axillary 66 - 96 %  07/05/16 1503 113/78 98 F (36.7 C) - (!) 55 19 93 %  07/05/16 1451 104/65 - - (!) 57 17 97 %  07/05/16 1430 92/67 - - (!) 54 15 96 %  07/05/16 1415 (!) 104/59 - - (!) 55 15 96 %  07/05/16 1350 (!) 110/99 - - (!) 54 15 95 %  07/05/16 1315 99/80 - - 61 18 96 %  07/05/16 1300 91/61 - - (!) 54 17 97 %  07/05/16 1245 (!) 89/58 - - (!) 52 13 96 %       Recent laboratory studies:  Recent Labs  07/06/16 0313  WBC 9.8  HGB 11.8*  HCT 37.0  PLT 266    Recent Labs  07/06/16 0313  NA 137  K 3.9  CL 103  CO2 26  BUN 6  CREATININE 0.64  GLUCOSE 162*  CALCIUM 8.9   Lab Results  Component Value Date   INR 1.03 06/25/2016   INR 1.07  07/15/2015   INR 1.6 (H) 08/25/2008     Recent Radiographic Studies :  No results found.  DISCHARGE INSTRUCTIONS: Discharge Instructions    CPM    Complete by:  As directed    Continuous passive motion machine (CPM):      Use the CPM from 0 to 90 for 4-6 hours per day.      You may increase by 10 per day.  You may break it up into 2 or 3 sessions per day.      Use CPM for 2 weeks or until you are told to stop.   Call MD / Call 911    Complete by:  As directed    If you experience chest pain or shortness of breath, CALL 911 and be transported to the hospital emergency room.  If you develope a fever above 101 F, pus (white drainage) or increased drainage or redness at the wound, or calf pain, call your surgeon's office.   Constipation Prevention    Complete by:  As directed    Drink plenty of fluids.  Prune juice may be helpful.  You may use a stool softener, such as Colace (over the counter) 100 mg twice a day.  Use MiraLax (over the counter) for constipation as needed.   Diet - low sodium heart healthy    Complete by:  As directed    Discharge instructions    Complete by:  As directed    INSTRUCTIONS AFTER JOINT REPLACEMENT   Remove items at home which could result in a fall. This includes throw rugs or furniture in walking pathways ICE to the affected joint every three hours while awake for 30 minutes at a time, for at least the first 3-5 days, and then as needed for pain and swelling.  Continue to use ice for pain and swelling. You may notice swelling that will progress down to the foot and ankle.  This is normal after surgery.  Elevate your leg when you are not up walking on it.   Continue to use the breathing machine you got in the hospital (incentive spirometer) which will help keep your temperature down.  It is common for your temperature to cycle up and down following surgery, especially at night when you are not up moving around and exerting yourself.  The breathing machine keeps  your lungs expanded and your temperature down.   DIET:  As you were doing prior to hospitalization, we recommend a well-balanced diet.  DRESSING / WOUND CARE / SHOWERING  Keep the surgical dressing until follow up.  The dressing is water proof, so you can shower without any extra covering.  IF THE DRESSING FALLS OFF or the wound gets wet inside, change the dressing with sterile gauze.  Please use good hand washing techniques before changing the dressing.  Do not use any lotions or creams on the incision until instructed by your surgeon.    ACTIVITY  Increase activity slowly as tolerated, but follow the weight bearing instructions below.   No driving for 6 weeks or until further direction given by your physician.  You cannot drive while taking narcotics.  No lifting or carrying greater than 10 lbs. until further directed by your surgeon. Avoid periods of inactivity such as sitting longer than an hour when not asleep. This helps prevent blood clots.  You may return to work once you are authorized by your doctor.     WEIGHT BEARING   Weight bearing as tolerated with assist device (walker, cane, etc) as directed, use it as long as suggested by your surgeon or therapist, typically at least 4-6 weeks.   EXERCISES  Results after joint replacement surgery are often greatly improved when you follow the exercise, range of motion and muscle strengthening exercises prescribed by your doctor. Safety measures are also important to protect the joint from further injury. Any time any of these exercises cause you to have increased pain or swelling, decrease what you are doing until you are comfortable again and then slowly increase them. If you have problems or questions, call your caregiver or physical therapist for advice.   Rehabilitation is important following a joint replacement. After just a few days of immobilization, the muscles of the leg can become weakened and shrink (atrophy).  These exercises  are designed to build up the tone and strength of the thigh and leg muscles and to improve motion. Often times heat used for twenty to thirty minutes before working out will loosen up your tissues and help with improving the range of motion but do not use heat for the first two weeks following surgery (sometimes heat can increase post-operative swelling).   These exercises can be done on a training (exercise) mat, on the floor, on a table or on a bed. Use whatever works the best and is most comfortable for you.    Use music or television while you are exercising so that the exercises are a pleasant break in your day. This will make your life better with the exercises acting as a break in your routine that you can look forward to.   Perform all exercises about fifteen times, three times per day or as directed.  You should exercise both the operative leg and the other leg as well.   Exercises include:   Quad Sets - Tighten up the muscle on the front of the thigh (Quad) and hold for 5-10 seconds.   Straight Leg Raises - With your knee straight (if you were given a brace, keep it on), lift the leg to 60 degrees, hold for 3 seconds, and slowly lower the leg.  Perform this exercise against resistance later  as your leg gets stronger.  Leg Slides: Lying on your back, slowly slide your foot toward your buttocks, bending your knee up off the floor (only go as far as is comfortable). Then slowly slide your foot back down until your leg is flat on the floor again.  Angel Wings: Lying on your back spread your legs to the side as far apart as you can without causing discomfort.  Hamstring Strength:  Lying on your back, push your heel against the floor with your leg straight by tightening up the muscles of your buttocks.  Repeat, but this time bend your knee to a comfortable angle, and push your heel against the floor.  You may put a pillow under the heel to make it more comfortable if necessary.   A rehabilitation  program following joint replacement surgery can speed recovery and prevent re-injury in the future due to weakened muscles. Contact your doctor or a physical therapist for more information on knee rehabilitation.    CONSTIPATION  Constipation is defined medically as fewer than three stools per week and severe constipation as less than one stool per week.  Even if you have a regular bowel pattern at home, your normal regimen is likely to be disrupted due to multiple reasons following surgery.  Combination of anesthesia, postoperative narcotics, change in appetite and fluid intake all can affect your bowels.   YOU MUST use at least one of the following options; they are listed in order of increasing strength to get the job done.  They are all available over the counter, and you may need to use some, POSSIBLY even all of these options:    Drink plenty of fluids (prune juice may be helpful) and high fiber foods Colace 100 mg by mouth twice a day  Senokot for constipation as directed and as needed Dulcolax (bisacodyl), take with full glass of water  Miralax (polyethylene glycol) once or twice a day as needed.  If you have tried all these things and are unable to have a bowel movement in the first 3-4 days after surgery call either your surgeon or your primary doctor.    If you experience loose stools or diarrhea, hold the medications until you stool forms back up.  If your symptoms do not get better within 1 week or if they get worse, check with your doctor.  If you experience "the worst abdominal pain ever" or develop nausea or vomiting, please contact the office immediately for further recommendations for treatment.   ITCHING:  If you experience itching with your medications, try taking only a single pain pill, or even half a pain pill at a time.  You can also use Benadryl over the counter for itching or also to help with sleep.   TED HOSE STOCKINGS:  Use stockings on both legs until for at least 2  weeks or as directed by physician office. They may be removed at night for sleeping.  MEDICATIONS:  See your medication summary on the "After Visit Summary" that nursing will review with you.  You may have some home medications which will be placed on hold until you complete the course of blood thinner medication.  It is important for you to complete the blood thinner medication as prescribed.  PRECAUTIONS:  If you experience chest pain or shortness of breath - call 911 immediately for transfer to the hospital emergency department.   If you develop a fever greater that 101 F, purulent drainage from wound, increased redness or drainage  from wound, foul odor from the wound/dressing, or calf pain - CONTACT YOUR SURGEON.                                                   FOLLOW-UP APPOINTMENTS:  If you do not already have a post-op appointment, please call the office for an appointment to be seen by your surgeon.  Guidelines for how soon to be seen are listed in your "After Visit Summary", but are typically between 1-4 weeks after surgery.  OTHER INSTRUCTIONS:   Knee Replacement:  Do not place pillow under knee, focus on keeping the knee straight while resting. CPM instructions: 0-90 degrees, 2 hours in the morning, 2 hours in the afternoon, and 2 hours in the evening. Place foam block, curve side up under heel at all times except when in CPM or when walking.  DO NOT modify, tear, cut, or change the foam block in any way.  MAKE SURE YOU:  Understand these instructions.  Get help right away if you are not doing well or get worse.    Thank you for letting us be a part of your medical care team.  It is a privilege we respect greatly.  We hope these instructions will help you stay on track for a fast and full recovery!   Increase activity slowly as tolerated    Complete by:  As directed       DISCHARGE MEDICATIONS:     Medication List    TAKE these medications   ALIGN 4 MG Caps Take 4 mg by  mouth daily as needed. supplement   amLODipine-benazepril 5-40 MG capsule Commonly known as:  LOTREL Take 1 capsule by mouth daily.   aspirin 325 MG EC tablet Take 1 tablet (325 mg total) by mouth 2 (two) times daily. What changed:  medication strength  how much to take  when to take this   CALCIUM 600+D 600-400 MG-UNIT tablet Generic drug:  Calcium Carbonate-Vitamin D Take 1 tablet by mouth 3 (three) times a week.   CHOLESTOFF PLUS PO Take 1 tablet by mouth daily.   CO Q 10 PO Take 200 mg by mouth every other day.   fexofenadine 180 MG tablet Commonly known as:  ALLEGRA Take 180 mg by mouth daily as needed for allergies or rhinitis.   GLUCOSAMINE 1500 COMPLEX PO Take 1 tablet by mouth every other day.   hydrochlorothiazide 25 MG tablet Commonly known as:  HYDRODIURIL Take 25 mg by mouth daily.   hyoscyamine 0.125 MG SL tablet Commonly known as:  LEVSIN SL Place 0.125 mg under the tongue as needed for cramping (before meals(dissolve on tongue)).   methocarbamol 500 MG tablet Commonly known as:  ROBAXIN Take 1-2 tablets (500-1,000 mg total) by mouth every 6 (six) hours as needed for muscle spasms.   mometasone 50 MCG/ACT nasal spray Commonly known as:  NASONEX Place 2 sprays into the nose 3 (three) times a week.   multivitamin with minerals Tabs tablet Take 1 tablet by mouth 3 (three) times a week.   OMEGA 3 PO Take 1 capsule by mouth 3 (three) times a week.   OVER THE COUNTER MEDICATION Take 1 tablet by mouth daily. "LA-3 Live Cell" supplement   oxyCODONE 5 MG immediate release tablet Commonly known as:  Oxy IR/ROXICODONE Take 1-2 tablets (5-10 mg total) by  mouth every 3 (three) hours as needed for breakthrough pain.   rosuvastatin 20 MG tablet Commonly known as:  CRESTOR Take 1 tablet (20 mg total) by mouth daily.   traMADol 50 MG tablet Commonly known as:  ULTRAM Take 1-2 tablets (50-100 mg total) by mouth every 6 (six) hours.   TURMERIC  PO Take 500 mg by mouth every other day.   VITAMIN B-12 PO Take 1 tablet by mouth every other day.   VITAMIN D PO Take 2,000 Units by mouth every other day.   vitamin E 400 UNIT capsule Take 400 Units by mouth every other day.       FOLLOW UP VISIT:    DISPOSITION: HOME VS. SNF  CONDITION:  Good   Donia Ast 07/06/2016, 12:40 PM

## 2016-07-06 NOTE — Progress Notes (Signed)
Orthopedic Tech Progress Note Patient Details:  Andrea Farmer 07-23-1941 [id number]  Patient ID: Andrea Farmer, female   DOB: 07/26/1941, 75 y.o.   MRN: [id number]   Andrea Farmer 07/06/2016, 1:58 PM Place pt's rle on cpm @0 -90 degrees @1400 ; RN notified

## 2016-07-06 NOTE — Evaluation (Signed)
Physical Therapy Evaluation Patient Details Name: Andrea Farmer MRN: [id number] DOB: 03/04/1941 Today's Date: 07/06/2016   History of Present Illness  Patient is a 75 y/o female with hx of multiple knee surgeries, bil total knee replacements, HTN, high cholesterol presents s/p right total knee revision with scar debridement/patella revision with polyexchange.   Clinical Impression  Patient presents with pain and post surgical deficits s/p above surgery impacting mobility. Tolerated transfers and gait training with Min guard assist for safety. Education re: exercises, positioning, precautions etc. Pt will have support from spouse at home. Plan for stair training in PM prior to d/c. Will follow acutely to maximize independence and mobility prior to return home.     Follow Up Recommendations Home health PT;Supervision - Intermittent    Equipment Recommendations  None recommended by PT    Recommendations for Other Services       Precautions / Restrictions Precautions Precautions: Knee Precaution Booklet Issued: No Precaution Comments: Reviewed no pillow under knee and precautions Restrictions Weight Bearing Restrictions: Yes RLE Weight Bearing: Weight bearing as tolerated      Mobility  Bed Mobility Overal bed mobility: Needs Assistance Bed Mobility: Supine to Sit;Sit to Supine     Supine to sit: Supervision;HOB elevated Sit to supine: Supervision;HOB elevated   General bed mobility comments: No assist needed. Able to bring RLE into/out of bed.   Transfers Overall transfer level: Needs assistance Equipment used: Rolling walker (2 wheeled) Transfers: Sit to/from Stand Sit to Stand: Min guard         General transfer comment: Min guard for safety. Stood from [employer]. Cues for hand placement/technique.  Ambulation/Gait Ambulation/Gait assistance: Min guard Ambulation Distance (Feet): 150 Feet Assistive device: Rolling walker (2 wheeled) Gait Pattern/deviations:  Step-through pattern;Step-to pattern;Decreased stance time - right;Decreased step length - left;Trunk flexed Gait velocity: decreased   General Gait Details: Cues to roll RW instead of pick it up for more normalized gait pattern; step through gait with cues for knee extension during stance phase.   Stairs            Wheelchair Mobility    Modified Rankin (Stroke Patients Only)       Balance Overall balance assessment: Needs assistance Sitting-balance support: Feet supported;No upper extremity supported Sitting balance-Leahy Scale: Good     Standing balance support: During functional activity Standing balance-Leahy Scale: Fair                               Pertinent Vitals/Pain Pain Assessment: Faces Faces Pain Scale: Hurts a little bit Pain Location: right knee Pain Descriptors / Indicators: Sore;Operative site guarding Pain Intervention(s): Monitored during session;Repositioned    Home Living Family/patient expects to be discharged to:: Private residence Living Arrangements: Spouse/significant other Available Help at Discharge: Family;Available 24 hours/day Type of Home:  (condo) Home Access: Level entry     Home Layout: One level Home Equipment: Walker - 2 wheels;Cane - single point;Shower seat - built in      Prior Function Level of Independence: Independent         Comments: Drives, cooks, cleans.     Hand Dominance        Extremity/Trunk Assessment   Upper Extremity Assessment: Defer to OT evaluation           Lower Extremity Assessment: RLE deficits/detail RLE Deficits / Details: Limited AROM/strength secondary to pain./surgery       Communication   Communication:  No difficulties  Cognition Arousal/Alertness: Awake/alert Behavior During Therapy: WFL for tasks assessed/performed Overall Cognitive Status: Within Functional Limits for tasks assessed                      General Comments General comments (skin  integrity, edema, etc.): Spouse present during session.    Exercises Total Joint Exercises Ankle Circles/Pumps: Both;10 reps;Supine Quad Sets: Both;10 reps;Supine Heel Slides: Right;10 reps;Supine Hip ABduction/ADduction: Right;10 reps;Supine Straight Leg Raises: Right;AAROM;10 reps;Supine Goniometric ROM: 8-75 degrees knee AROM   Assessment/Plan    PT Assessment Patient needs continued PT services  PT Problem List Decreased strength;Decreased mobility;Decreased knowledge of precautions;Decreased range of motion;Decreased balance;Pain          PT Treatment Interventions DME instruction;Therapeutic activities;Therapeutic exercise;Patient/family education;Gait training;Stair training;Balance training;Functional mobility training    PT Goals (Current goals can be found in the Care Plan section)  Acute Rehab PT Goals Patient Stated Goal: to be able to go to [place] next [facility] PT Goal Formulation: With patient Time For Goal Achievement: 07/20/16 Potential to Achieve Goals: Good    Frequency 7X/week   Barriers to discharge        Co-evaluation               End of Session Equipment Utilized During Treatment: Gait belt Activity Tolerance: Patient tolerated treatment well Patient left: in bed;with call bell/phone within reach;with family/visitor present Nurse Communication: Mobility status         Time: [id number] PT Time Calculation (min) (ACUTE ONLY): 27 min   Charges:   PT Evaluation $PT Eval Low Complexity: 1 Procedure PT Treatments $Gait Training: 8-22 mins   PT G Codes:        Andrea Farmer 07/06/2016, 9:31 AM Wray Kearns, [place], DPT [phone]

## 2016-07-06 NOTE — Progress Notes (Signed)
D/C instructions reviewed with pt earlier, copy of instructions and scripts given to pt. Pt waited for MD to round per PA informed pt that MD would round around 1200 today. At this time pt ride here and pt d/c'd at this time via wheelchair with her belongings and with her family, escorted by unit NT.

## 2016-07-06 NOTE — Evaluation (Signed)
Occupational Therapy Evaluation and Discharge Patient Details Name: Andrea Farmer MRN: [id number] DOB: Feb 19, 1941 Today's Date: 07/06/2016    History of Present Illness Patient is a 75 y/o female with hx of multiple knee surgeries, bil total knee replacements, HTN, high cholesterol presents s/p right total knee revision with scar debridement/patella revision with polyexchange.    Clinical Impression   PTA pt independent in ADL and IADL. Pt able to perform sink level grooming standing and get fully dressed during OT session with very min A for LB dressing. Pt with no questions or concerns about going home. Pt with appropriate DME and AE. Pt accepting of education, OT to sign off.    Follow Up Recommendations  No OT follow up;Supervision - Intermittent    Equipment Recommendations  None recommended by OT    Recommendations for Other Services       Precautions / Restrictions Precautions Precautions: Knee Precaution Booklet Issued: No Precaution Comments: Reviewed no pillow under knee and precautions Restrictions Weight Bearing Restrictions: Yes RLE Weight Bearing: Weight bearing as tolerated      Mobility Bed Mobility Overal bed mobility: Needs Assistance Bed Mobility: Supine to Sit     Supine to sit: Supervision;HOB elevated     General bed mobility comments: No assist needed. Able to bring RLE into/out of bed.   Transfers Overall transfer level: Needs assistance Equipment used: Rolling walker (2 wheeled)   Sit to Stand: Min guard         General transfer comment: min guard for safety. Stood from EOB x 5 during dressing. Safe handplacement without verbal cues    Balance Overall balance assessment: No apparent balance deficits (not formally assessed)                                          ADL Overall ADL's : Needs assistance/impaired Eating/Feeding: Modified independent;Sitting   Grooming: Wash/dry hands;Wash/dry face;Oral  care;Supervision/safety;Standing Grooming Details (indicate cue type and reason): at sink       Lower Body Bathing Details (indicate cue type and reason): educated on use of long handle sponge Upper Body Dressing : Modified independent;Standing Upper Body Dressing Details (indicate cue type and reason): sit to stand supervision to don bra Lower Body Dressing: Minimal assistance;Sit to/from stand Lower Body Dressing Details (indicate cue type and reason): min assist to get loop of underwear/shorts Toilet Transfer: Modified Independent;Ambulation;Comfort height toilet   Toileting- Clothing Manipulation and Hygiene: Supervision/safety   Tub/ Shower Transfer: Walk-in shower;Supervision/safety;Shower seat (built in seat) Clinical cytogeneticist Details (indicate cue type and reason): educated on long handle sponge Functional mobility during ADLs: Min guard;Rolling walker General ADL Comments: Pt motivated and takes safety advice seriously. Pt's husband asleep in room during session, but did not recieve education.     Vision Vision Assessment?: No apparent visual deficits   Perception     Praxis      Pertinent Vitals/Pain Pain Assessment: No/denies pain     Hand Dominance Right   Extremity/Trunk Assessment Upper Extremity Assessment Upper Extremity Assessment: Overall WFL for tasks assessed   Lower Extremity Assessment Lower Extremity Assessment: RLE deficits/detail RLE Deficits / Details: Limited AROM/strength secondary to pain./surgery       Communication Communication Communication: No difficulties   Cognition Arousal/Alertness: Awake/alert Behavior During Therapy: WFL for tasks assessed/performed Overall Cognitive Status: Within Functional Limits for tasks assessed  General Comments       Exercises       Shoulder Instructions      Home Living Family/patient expects to be discharged to:: Private residence Living Arrangements:  Spouse/significant other Available Help at Discharge: Family;Available 24 hours/day Type of Home: Other(Comment) (Condo) Home Access: Level entry     Home Layout: One level     Bathroom Shower/Tub: Occupational psychologist: Handicapped height Bathroom Accessibility: Yes How Accessible: Accessible via walker Home Equipment: Hand held shower head;Walker - 2 wheels;Shower seat - built in;Cane - single point          Prior Functioning/Environment Level of Independence: Independent        Comments: Drives, cooks, cleans.        OT Problem List: Decreased strength;Decreased range of motion   OT Treatment/Interventions:      OT Goals(Current goals can be found in the care plan section) Acute Rehab OT Goals Patient Stated Goal: to be able to go to [place] next [facility] OT Goal Formulation: With patient Time For Goal Achievement: 07/13/16 Potential to Achieve Goals: Good  OT Frequency:     Barriers to D/C:            Co-evaluation              End of Session Equipment Utilized During Treatment: Rolling walker Nurse Communication: Mobility status  Activity Tolerance: Patient tolerated treatment well Patient left: in bed;with call bell/phone within reach;with family/visitor present   Time: 1006-1029 OT Time Calculation (min): 23 min Charges:  OT General Charges $OT Visit: 1 Procedure OT Evaluation $OT Eval Low Complexity: 1 Procedure OT Treatments $Self Care/Home Management : 8-22 mins G-Codes:    Merri Ray Shantea Poulton 2016-07-21, 4:58 PM Hulda Humphrey OTR/L [phone]

## 2016-07-06 NOTE — Progress Notes (Signed)
Physical Therapy Treatment Patient Details Name: Andrea Farmer MRN: [id number] DOB: [birth year] Today's Date: 07/06/2016    History of Present Illness Patient is Andrea 75 y/o female with hx of multiple knee surgeries, bil total knee replacements, HTN, high cholesterol presents s/p right total knee revision with scar debridement/patella revision with polyexchange.     PT Comments    Patient progressing well. Performed stair training this session with supervision for safety. Cues for technique/safety. Discussed car transfer and activity progression at home. Pt safe to discharge from Andrea mobility stand point. All education completed. Will follow if still in hospital tomorrow.   Follow Up Recommendations  Home health PT;Supervision - Intermittent     Equipment Recommendations  None recommended by PT    Recommendations for Other Services       Precautions / Restrictions Precautions Precautions: Knee Precaution Booklet Issued: No Precaution Comments: Reviewed no pillow under knee and precautions Restrictions Weight Bearing Restrictions: Yes RLE Weight Bearing: Weight bearing as tolerated    Mobility  Bed Mobility Overal bed mobility: Needs Assistance Bed Mobility: Supine to Sit;Sit to Supine     Supine to sit: Modified independent (Device/Increase time);HOB elevated Sit to supine: Modified independent (Device/Increase time);HOB elevated   General bed mobility comments: No assist needed. Able to bring RLE into/out of bed.   Transfers Overall transfer level: Needs assistance Equipment used: Rolling walker (2 wheeled) Transfers: Sit to/from Stand Sit to Stand: Modified independent (Device/Increase time)         General transfer comment: Supervision for safety. Stood from [employer], from toilet x1.   Ambulation/Gait Ambulation/Gait assistance: Supervision Ambulation Distance (Feet): 300 Feet Assistive device: Rolling walker (2 wheeled) Gait Pattern/deviations: Step-through  pattern;Decreased stride length Gait velocity: decreased   General Gait Details: Cues for heel strike and to flex knee during swing phase as pt tends to circumduct at times.   Stairs Stairs: Yes Stairs assistance: Supervision Stair Management: Two rails Number of Stairs: 3 (+ 2 steps x2 bouts) General stair comments: Cues for technique however pt adamant about trying reciprocal stepping pattern.  Wheelchair Mobility    Modified Rankin (Stroke Patients Only)       Balance Overall balance assessment: No apparent balance deficits (not formally assessed) Sitting-balance support: Feet supported;No upper extremity supported Sitting balance-Leahy Scale: Good     Standing balance support: During functional activity Standing balance-Leahy Scale: Fair                      Cognition Arousal/Alertness: Awake/alert Behavior During Therapy: WFL for tasks assessed/performed Overall Cognitive Status: Within Functional Limits for tasks assessed                      Exercises     General Comments General comments (skin integrity, edema, etc.): Spouse present during session.      Pertinent Vitals/Pain Pain Assessment: No/denies pain Pain Score: 1  Faces Pain Scale: Hurts Andrea little bit Pain Location: R knee Pain Descriptors / Indicators: Sore;Operative site guarding Pain Intervention(s): Monitored during session;Repositioned    Home Living Family/patient expects to be discharged to:: Private residence Living Arrangements: Spouse/significant other Available Help at Discharge: Family;Available 24 hours/day Type of Home: Other(Comment) (Condo) Home Access: Level entry   Home Layout: One level Home Equipment: Hand held shower head;Walker - 2 wheels;Shower seat - built in;Cane - single point      Prior Function Level of Independence: Independent      Comments: Drives, cooks,  cleans.   PT Goals (current goals can now be found in the care plan section) Acute  Rehab PT Goals Patient Stated Goal: to be able to go to [place] next [facility] PT Goal Formulation: With patient Time For Goal Achievement: 07/20/16 Potential to Achieve Goals: Good Progress towards PT goals: Progressing toward goals    Frequency    7X/week      PT Plan Current plan remains appropriate    Co-evaluation             End of Session Equipment Utilized During Treatment: Gait belt Activity Tolerance: Patient tolerated treatment well Patient left: in bed;with call bell/phone within reach;with family/visitor present     Time: [id number] PT Time Calculation (min) (ACUTE ONLY): 19 min  Charges:  $Gait Training: 8-22 mins                    G Codes:      Andrea Farmer Andrea Farmer 07/06/2016, 11:56 AM

## 2016-07-06 NOTE — Care Management Note (Signed)
Case Management Note  Patient Details  Name: DEVIN WILFERT MRN: [id number] Date of Birth: 1941-07-04  Subjective/Objective:  75 yr old female s/p left total knee revision.             Action/Plan: case manager spoke with patient concerning [place] and DME needs. Patient was preoperatively setup with [facility], no changes. Patient has rolling walker, 3in1 and CPM already at the home. She will have family support at discharge.   Expected Discharge Date:   07/06/16               Expected Discharge Plan:  [place]  In-House Referral:     Discharge planning Services  CM Consult  Post Acute Care Choice:  Home Health Choice offered to:  Patient  DME Arranged:  N/A DME Agency:  [facility]  HH Arranged:  PT [place] Agency:  [facility] (now [facility])  Status of Service:  Completed, signed off  If discussed at [place] of Stay Meetings, dates discussed:    Additional Comments:  Ninfa Meeker, RN 07/06/2016, 2:07 PM

## 2016-07-06 NOTE — Progress Notes (Signed)
SPORTS MEDICINE AND JOINT REPLACEMENT  Lara Mulch, MD    Carlyon Shadow, PA-C [address], [place], [place]  [address]                             [phone]   PROGRESS NOTE  Subjective:  negative for Chest Pain  negative for Shortness of Breath  negative for Nausea/Vomiting   negative for Calf Pain  negative for Bowel Movement   Tolerating Diet: yes         Patient reports pain as 5 on 0-10 scale.    Objective: Vital signs in last 24 hours:   Patient Vitals for the past 24 hrs:  BP Temp Temp src Pulse Resp SpO2 Height Weight  07/06/16 0528 (!) 112/54 98.3 F (36.8 C) Oral 67 16 95 % - -  07/06/16 0120 122/76 98.7 F (37.1 C) Oral 84 16 94 % - -  07/05/16 2214 (!) 119/51 99.8 F (37.7 C) Oral 74 16 97 % - -  07/05/16 1617 116/80 97.9 F (36.6 C) Axillary 66 - 96 % - -  07/05/16 1503 113/78 98 F (36.7 C) - (!) 55 19 93 % - -  07/05/16 1451 104/65 - - (!) 57 17 97 % - -  07/05/16 1430 92/67 - - (!) 54 15 96 % - -  07/05/16 1415 (!) 104/59 - - (!) 55 15 96 % - -  07/05/16 1350 (!) 110/99 - - (!) 54 15 95 % - -  07/05/16 1315 99/80 - - 61 18 96 % - -  07/05/16 1300 91/61 - - (!) 54 17 97 % - -  07/05/16 1245 (!) 89/58 - - (!) 52 13 96 % - -  07/05/16 1230 100/63 - - (!) 54 13 97 % - -  07/05/16 1215 (!) 93/53 - - (!) 51 14 97 % - -  07/05/16 1200 101/62 - - (!) 53 18 100 % - -  07/05/16 1143 102/64 97.6 F (36.4 C) - - 13 96 % - -  07/05/16 0915 112/61 - - 60 12 96 % - -  07/05/16 0900 130/63 - - 66 (!) 21 97 % - -  07/05/16 0801 (!) 148/65 98.1 F (36.7 C) Oral 66 20 97 % 5' 4.5" (1.638 m) 89.2 kg (196 lb 11 oz)    @flow {1959:LAST@   Intake/Output from previous day:   09/25 0701 - 09/26 0700 In: 1552.5 [I.V.:1402.5] Out: 250 [Urine:100]   Intake/Output this shift:   No intake/output data recorded.   Intake/Output      09/25 0701 - 09/26 0700 09/26 0701 - 09/27 0700   I.V. (mL/kg) 1402.5 (15.7)    IV Piggyback 150    Total Intake(mL/kg) 1552.5  (17.4)    Urine (mL/kg/hr) 100    Blood 150    Total Output 250     Net +1302.5          Urine Occurrence 4 x       LABORATORY DATA:  Recent Labs  07/06/16 0313  WBC 9.8  HGB 11.8*  HCT 37.0  PLT 266    Recent Labs  07/06/16 0313  NA 137  K 3.9  CL 103  CO2 26  BUN 6  CREATININE 0.64  GLUCOSE 162*  CALCIUM 8.9   Lab Results  Component Value Date   INR 1.03 06/25/2016   INR 1.07 07/15/2015   INR 1.6 (H) 08/25/2008  Examination:  General appearance: alert, cooperative and no distress Extremities: extremities normal, atraumatic, no cyanosis or edema  Wound Exam: clean, dry, intact   Drainage:  None: wound tissue dry  Motor Exam: Quadriceps and Hamstrings Intact  Sensory Exam: Superficial Peroneal, Deep Peroneal and Tibial normal   Assessment:    1 Day Post-Op  Procedure(s) (LRB): TOTAL KNEE REVISION WITH SCAR DEBRIDEMENT/PATELLA REVISION WITH POLY EXCHANGE (Right)  ADDITIONAL DIAGNOSIS:  Active Problems:   S/P revision of total knee  Acute Blood Loss Anemia   Plan: Physical Therapy as ordered Weight Bearing as Tolerated (WBAT)  DVT Prophylaxis:  Aspirin  DISCHARGE PLAN: Home  DISCHARGE NEEDS: HHPT   Patient doing great, anticipate D/C today         Donia Ast 07/06/2016, 7:14 AM

## 2016-07-07 DIAGNOSIS — Z79891 Long term (current) use of opiate analgesic: Secondary | ICD-10-CM | POA: Diagnosis not present

## 2016-07-07 DIAGNOSIS — M4690 Unspecified inflammatory spondylopathy, site unspecified: Secondary | ICD-10-CM | POA: Diagnosis not present

## 2016-07-07 DIAGNOSIS — E7800 Pure hypercholesterolemia, unspecified: Secondary | ICD-10-CM | POA: Diagnosis not present

## 2016-07-07 DIAGNOSIS — Z7982 Long term (current) use of aspirin: Secondary | ICD-10-CM | POA: Diagnosis not present

## 2016-07-07 DIAGNOSIS — K589 Irritable bowel syndrome without diarrhea: Secondary | ICD-10-CM | POA: Diagnosis not present

## 2016-07-07 DIAGNOSIS — T84092D Other mechanical complication of internal right knee prosthesis, subsequent encounter: Secondary | ICD-10-CM | POA: Diagnosis not present

## 2016-07-07 DIAGNOSIS — M8580 Other specified disorders of bone density and structure, unspecified site: Secondary | ICD-10-CM | POA: Diagnosis not present

## 2016-07-07 DIAGNOSIS — Z9181 History of falling: Secondary | ICD-10-CM | POA: Diagnosis not present

## 2016-07-07 DIAGNOSIS — I10 Essential (primary) hypertension: Secondary | ICD-10-CM | POA: Diagnosis not present

## 2016-07-07 NOTE — Op Note (Signed)
Dictation Number:  [id number]

## 2016-07-08 NOTE — Op Note (Signed)
NAMESERENTIY, HOSBACH NO.:  [id number]  MEDICAL RECORD NO.:  [id number]  LOCATION:  [id number]                        FACILITY:  [facility]  PHYSICIAN:  Estill Bamberg. Ronnie Derby, M.D. DATE OF BIRTH:  January 19, 1941  DATE OF PROCEDURE:  07/05/2016 DATE OF DISCHARGE:  07/06/2016                              OPERATIVE REPORT   SURGEON:  Estill Bamberg. Ronnie Derby, M.D.  ASSISTANT:  Carlyon Shadow, PA-C.  ANESTHESIA:  Spinal.  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  PROCEDURE:  Right open synovectomy and polyethylene exchange (revision total knee on 1 side only).  INDICATION FOR PROCEDURE:  Patient is a 75 year old, who is several years out from a total knee replacement with painful scar tissue and after failure of conservative measures, informed consent was obtained, and she was taken to the operating room.  DESCRIPTION OF PROCEDURE:  Patient was taken to the operating room, administered spinal anesthesia, the patient was placed supine on the operating table.  Right leg prepped and draped in the usual fashion. The extremity was exsanguinated and tourniquet inflated to 300 mmHg.  I then made a midline incision through the old incision with a #10 blade. I used a new blade to make a median parapatellar arthrotomy and performed synovectomy.  I then carved out the scar tissue which was very significant.  I got all the scar tissue out of the suprapatellar pouch, medial and lateral gutters.  I then removed the polyethylene and removed scar tissue from the back of the knee as well.  I then irrigated and placed a new polyethylene of the same size in the knee.  I checked range of motion.  Preoperatively, from 5 degrees to about 60 degrees. Postoperatively, 0 degrees to 110 degrees.  I then left the tourniquet down and obtained hemostasis.  I closed the arthrotomy with  figure-of- eight #1 Vicryl sutures with 0 Vicryl sutures in the deep soft tissue, subcuticular 3-0 Monocryl, Steri-Strips and Adaptic.  Left tourniquet down at 28 minutes.  Complications none.  Drains none.  EBL minimal.          ______________________________ Estill Bamberg. Ronnie Derby, M.D.     SDL/MEDQ  D:  07/07/2016  T:  07/08/2016  Job:  [id number]

## 2016-07-10 DIAGNOSIS — M4690 Unspecified inflammatory spondylopathy, site unspecified: Secondary | ICD-10-CM | POA: Diagnosis not present

## 2016-07-10 DIAGNOSIS — Z79891 Long term (current) use of opiate analgesic: Secondary | ICD-10-CM | POA: Diagnosis not present

## 2016-07-10 DIAGNOSIS — Z9181 History of falling: Secondary | ICD-10-CM | POA: Diagnosis not present

## 2016-07-10 DIAGNOSIS — K589 Irritable bowel syndrome without diarrhea: Secondary | ICD-10-CM | POA: Diagnosis not present

## 2016-07-10 DIAGNOSIS — Z7982 Long term (current) use of aspirin: Secondary | ICD-10-CM | POA: Diagnosis not present

## 2016-07-10 DIAGNOSIS — M8580 Other specified disorders of bone density and structure, unspecified site: Secondary | ICD-10-CM | POA: Diagnosis not present

## 2016-07-10 DIAGNOSIS — E7800 Pure hypercholesterolemia, unspecified: Secondary | ICD-10-CM | POA: Diagnosis not present

## 2016-07-10 DIAGNOSIS — T84092D Other mechanical complication of internal right knee prosthesis, subsequent encounter: Secondary | ICD-10-CM | POA: Diagnosis not present

## 2016-07-10 DIAGNOSIS — I10 Essential (primary) hypertension: Secondary | ICD-10-CM | POA: Diagnosis not present

## 2016-07-19 DIAGNOSIS — I10 Essential (primary) hypertension: Secondary | ICD-10-CM | POA: Diagnosis not present

## 2016-07-19 DIAGNOSIS — E7800 Pure hypercholesterolemia, unspecified: Secondary | ICD-10-CM | POA: Diagnosis not present

## 2016-07-19 DIAGNOSIS — T84092D Other mechanical complication of internal right knee prosthesis, subsequent encounter: Secondary | ICD-10-CM | POA: Diagnosis not present

## 2016-07-19 DIAGNOSIS — M4690 Unspecified inflammatory spondylopathy, site unspecified: Secondary | ICD-10-CM | POA: Diagnosis not present

## 2016-07-19 DIAGNOSIS — Z9181 History of falling: Secondary | ICD-10-CM | POA: Diagnosis not present

## 2016-07-19 DIAGNOSIS — Z79891 Long term (current) use of opiate analgesic: Secondary | ICD-10-CM | POA: Diagnosis not present

## 2016-07-19 DIAGNOSIS — M8580 Other specified disorders of bone density and structure, unspecified site: Secondary | ICD-10-CM | POA: Diagnosis not present

## 2016-07-19 DIAGNOSIS — Z7982 Long term (current) use of aspirin: Secondary | ICD-10-CM | POA: Diagnosis not present

## 2016-07-19 DIAGNOSIS — K589 Irritable bowel syndrome without diarrhea: Secondary | ICD-10-CM | POA: Diagnosis not present

## 2016-07-20 DIAGNOSIS — Z96653 Presence of artificial knee joint, bilateral: Secondary | ICD-10-CM | POA: Diagnosis not present

## 2016-07-20 DIAGNOSIS — Z471 Aftercare following joint replacement surgery: Secondary | ICD-10-CM | POA: Diagnosis not present

## 2016-07-21 DIAGNOSIS — R262 Difficulty in walking, not elsewhere classified: Secondary | ICD-10-CM | POA: Diagnosis not present

## 2016-07-21 DIAGNOSIS — M25661 Stiffness of right knee, not elsewhere classified: Secondary | ICD-10-CM | POA: Diagnosis not present

## 2016-07-21 DIAGNOSIS — M25561 Pain in right knee: Secondary | ICD-10-CM | POA: Diagnosis not present

## 2016-07-21 DIAGNOSIS — M1711 Unilateral primary osteoarthritis, right knee: Secondary | ICD-10-CM | POA: Diagnosis not present

## 2016-07-23 DIAGNOSIS — M1711 Unilateral primary osteoarthritis, right knee: Secondary | ICD-10-CM | POA: Diagnosis not present

## 2016-07-23 DIAGNOSIS — M25661 Stiffness of right knee, not elsewhere classified: Secondary | ICD-10-CM | POA: Diagnosis not present

## 2016-07-23 DIAGNOSIS — R262 Difficulty in walking, not elsewhere classified: Secondary | ICD-10-CM | POA: Diagnosis not present

## 2016-07-23 DIAGNOSIS — M25561 Pain in right knee: Secondary | ICD-10-CM | POA: Diagnosis not present

## 2016-07-26 DIAGNOSIS — M25661 Stiffness of right knee, not elsewhere classified: Secondary | ICD-10-CM | POA: Diagnosis not present

## 2016-07-26 DIAGNOSIS — M25561 Pain in right knee: Secondary | ICD-10-CM | POA: Diagnosis not present

## 2016-07-26 DIAGNOSIS — R262 Difficulty in walking, not elsewhere classified: Secondary | ICD-10-CM | POA: Diagnosis not present

## 2016-07-26 DIAGNOSIS — M1711 Unilateral primary osteoarthritis, right knee: Secondary | ICD-10-CM | POA: Diagnosis not present

## 2016-07-28 DIAGNOSIS — M25661 Stiffness of right knee, not elsewhere classified: Secondary | ICD-10-CM | POA: Diagnosis not present

## 2016-07-28 DIAGNOSIS — R262 Difficulty in walking, not elsewhere classified: Secondary | ICD-10-CM | POA: Diagnosis not present

## 2016-07-28 DIAGNOSIS — M25561 Pain in right knee: Secondary | ICD-10-CM | POA: Diagnosis not present

## 2016-07-28 DIAGNOSIS — M1711 Unilateral primary osteoarthritis, right knee: Secondary | ICD-10-CM | POA: Diagnosis not present

## 2016-07-30 DIAGNOSIS — M25661 Stiffness of right knee, not elsewhere classified: Secondary | ICD-10-CM | POA: Diagnosis not present

## 2016-07-30 DIAGNOSIS — Z96651 Presence of right artificial knee joint: Secondary | ICD-10-CM | POA: Diagnosis not present

## 2016-07-30 DIAGNOSIS — M25561 Pain in right knee: Secondary | ICD-10-CM | POA: Diagnosis not present

## 2016-07-30 DIAGNOSIS — M1711 Unilateral primary osteoarthritis, right knee: Secondary | ICD-10-CM | POA: Diagnosis not present

## 2016-08-02 DIAGNOSIS — M25561 Pain in right knee: Secondary | ICD-10-CM | POA: Diagnosis not present

## 2016-08-02 DIAGNOSIS — M25661 Stiffness of right knee, not elsewhere classified: Secondary | ICD-10-CM | POA: Diagnosis not present

## 2016-08-02 DIAGNOSIS — M1711 Unilateral primary osteoarthritis, right knee: Secondary | ICD-10-CM | POA: Diagnosis not present

## 2016-08-02 DIAGNOSIS — Z96651 Presence of right artificial knee joint: Secondary | ICD-10-CM | POA: Diagnosis not present

## 2016-08-04 DIAGNOSIS — M25661 Stiffness of right knee, not elsewhere classified: Secondary | ICD-10-CM | POA: Diagnosis not present

## 2016-08-04 DIAGNOSIS — R262 Difficulty in walking, not elsewhere classified: Secondary | ICD-10-CM | POA: Diagnosis not present

## 2016-08-04 DIAGNOSIS — M1711 Unilateral primary osteoarthritis, right knee: Secondary | ICD-10-CM | POA: Diagnosis not present

## 2016-08-04 DIAGNOSIS — M25561 Pain in right knee: Secondary | ICD-10-CM | POA: Diagnosis not present

## 2016-08-06 DIAGNOSIS — M1711 Unilateral primary osteoarthritis, right knee: Secondary | ICD-10-CM | POA: Diagnosis not present

## 2016-08-06 DIAGNOSIS — R262 Difficulty in walking, not elsewhere classified: Secondary | ICD-10-CM | POA: Diagnosis not present

## 2016-08-06 DIAGNOSIS — M25661 Stiffness of right knee, not elsewhere classified: Secondary | ICD-10-CM | POA: Diagnosis not present

## 2016-08-06 DIAGNOSIS — M25561 Pain in right knee: Secondary | ICD-10-CM | POA: Diagnosis not present

## 2016-08-09 DIAGNOSIS — M25561 Pain in right knee: Secondary | ICD-10-CM | POA: Diagnosis not present

## 2016-08-09 DIAGNOSIS — M1711 Unilateral primary osteoarthritis, right knee: Secondary | ICD-10-CM | POA: Diagnosis not present

## 2016-08-09 DIAGNOSIS — R262 Difficulty in walking, not elsewhere classified: Secondary | ICD-10-CM | POA: Diagnosis not present

## 2016-08-09 DIAGNOSIS — M25661 Stiffness of right knee, not elsewhere classified: Secondary | ICD-10-CM | POA: Diagnosis not present

## 2016-08-11 DIAGNOSIS — M25661 Stiffness of right knee, not elsewhere classified: Secondary | ICD-10-CM | POA: Diagnosis not present

## 2016-08-11 DIAGNOSIS — R262 Difficulty in walking, not elsewhere classified: Secondary | ICD-10-CM | POA: Diagnosis not present

## 2016-08-11 DIAGNOSIS — M25561 Pain in right knee: Secondary | ICD-10-CM | POA: Diagnosis not present

## 2016-08-11 DIAGNOSIS — M1711 Unilateral primary osteoarthritis, right knee: Secondary | ICD-10-CM | POA: Diagnosis not present

## 2016-08-12 DIAGNOSIS — M1711 Unilateral primary osteoarthritis, right knee: Secondary | ICD-10-CM | POA: Diagnosis not present

## 2016-08-12 DIAGNOSIS — M25661 Stiffness of right knee, not elsewhere classified: Secondary | ICD-10-CM | POA: Diagnosis not present

## 2016-08-12 DIAGNOSIS — M25561 Pain in right knee: Secondary | ICD-10-CM | POA: Diagnosis not present

## 2016-08-12 DIAGNOSIS — R262 Difficulty in walking, not elsewhere classified: Secondary | ICD-10-CM | POA: Diagnosis not present

## 2016-08-16 DIAGNOSIS — M25661 Stiffness of right knee, not elsewhere classified: Secondary | ICD-10-CM | POA: Diagnosis not present

## 2016-08-16 DIAGNOSIS — M25561 Pain in right knee: Secondary | ICD-10-CM | POA: Diagnosis not present

## 2016-08-16 DIAGNOSIS — R262 Difficulty in walking, not elsewhere classified: Secondary | ICD-10-CM | POA: Diagnosis not present

## 2016-08-16 DIAGNOSIS — M1711 Unilateral primary osteoarthritis, right knee: Secondary | ICD-10-CM | POA: Diagnosis not present

## 2016-08-18 DIAGNOSIS — M25661 Stiffness of right knee, not elsewhere classified: Secondary | ICD-10-CM | POA: Diagnosis not present

## 2016-08-18 DIAGNOSIS — R262 Difficulty in walking, not elsewhere classified: Secondary | ICD-10-CM | POA: Diagnosis not present

## 2016-08-18 DIAGNOSIS — M25561 Pain in right knee: Secondary | ICD-10-CM | POA: Diagnosis not present

## 2016-08-18 DIAGNOSIS — M1711 Unilateral primary osteoarthritis, right knee: Secondary | ICD-10-CM | POA: Diagnosis not present

## 2016-08-26 DIAGNOSIS — R262 Difficulty in walking, not elsewhere classified: Secondary | ICD-10-CM | POA: Diagnosis not present

## 2016-08-26 DIAGNOSIS — M1711 Unilateral primary osteoarthritis, right knee: Secondary | ICD-10-CM | POA: Diagnosis not present

## 2016-08-26 DIAGNOSIS — M25561 Pain in right knee: Secondary | ICD-10-CM | POA: Diagnosis not present

## 2016-08-26 DIAGNOSIS — M25661 Stiffness of right knee, not elsewhere classified: Secondary | ICD-10-CM | POA: Diagnosis not present

## 2016-08-30 DIAGNOSIS — Z86010 Personal history of colonic polyps: Secondary | ICD-10-CM | POA: Diagnosis not present

## 2016-08-30 DIAGNOSIS — M25561 Pain in right knee: Secondary | ICD-10-CM | POA: Diagnosis not present

## 2016-08-30 DIAGNOSIS — R109 Unspecified abdominal pain: Secondary | ICD-10-CM | POA: Diagnosis not present

## 2016-08-30 DIAGNOSIS — R110 Nausea: Secondary | ICD-10-CM | POA: Diagnosis not present

## 2016-08-30 DIAGNOSIS — M25661 Stiffness of right knee, not elsewhere classified: Secondary | ICD-10-CM | POA: Diagnosis not present

## 2016-08-30 DIAGNOSIS — Z96651 Presence of right artificial knee joint: Secondary | ICD-10-CM | POA: Diagnosis not present

## 2016-08-30 DIAGNOSIS — K5902 Outlet dysfunction constipation: Secondary | ICD-10-CM | POA: Diagnosis not present

## 2016-08-30 DIAGNOSIS — M1711 Unilateral primary osteoarthritis, right knee: Secondary | ICD-10-CM | POA: Diagnosis not present

## 2016-09-06 DIAGNOSIS — M25561 Pain in right knee: Secondary | ICD-10-CM | POA: Diagnosis not present

## 2016-09-06 DIAGNOSIS — M25661 Stiffness of right knee, not elsewhere classified: Secondary | ICD-10-CM | POA: Diagnosis not present

## 2016-09-06 DIAGNOSIS — M1711 Unilateral primary osteoarthritis, right knee: Secondary | ICD-10-CM | POA: Diagnosis not present

## 2016-09-06 DIAGNOSIS — R262 Difficulty in walking, not elsewhere classified: Secondary | ICD-10-CM | POA: Diagnosis not present

## 2016-09-09 DIAGNOSIS — M25661 Stiffness of right knee, not elsewhere classified: Secondary | ICD-10-CM | POA: Diagnosis not present

## 2016-09-09 DIAGNOSIS — M1711 Unilateral primary osteoarthritis, right knee: Secondary | ICD-10-CM | POA: Diagnosis not present

## 2016-09-09 DIAGNOSIS — M25561 Pain in right knee: Secondary | ICD-10-CM | POA: Diagnosis not present

## 2016-09-09 DIAGNOSIS — R262 Difficulty in walking, not elsewhere classified: Secondary | ICD-10-CM | POA: Diagnosis not present

## 2016-09-13 DIAGNOSIS — M25561 Pain in right knee: Secondary | ICD-10-CM | POA: Diagnosis not present

## 2016-09-13 DIAGNOSIS — R262 Difficulty in walking, not elsewhere classified: Secondary | ICD-10-CM | POA: Diagnosis not present

## 2016-09-13 DIAGNOSIS — M1711 Unilateral primary osteoarthritis, right knee: Secondary | ICD-10-CM | POA: Diagnosis not present

## 2016-09-13 DIAGNOSIS — M25661 Stiffness of right knee, not elsewhere classified: Secondary | ICD-10-CM | POA: Diagnosis not present

## 2016-09-16 DIAGNOSIS — M1711 Unilateral primary osteoarthritis, right knee: Secondary | ICD-10-CM | POA: Diagnosis not present

## 2016-09-16 DIAGNOSIS — M25561 Pain in right knee: Secondary | ICD-10-CM | POA: Diagnosis not present

## 2016-09-16 DIAGNOSIS — Z96651 Presence of right artificial knee joint: Secondary | ICD-10-CM | POA: Diagnosis not present

## 2016-09-16 DIAGNOSIS — M25661 Stiffness of right knee, not elsewhere classified: Secondary | ICD-10-CM | POA: Diagnosis not present

## 2016-09-22 DIAGNOSIS — R262 Difficulty in walking, not elsewhere classified: Secondary | ICD-10-CM | POA: Diagnosis not present

## 2016-09-22 DIAGNOSIS — M25561 Pain in right knee: Secondary | ICD-10-CM | POA: Diagnosis not present

## 2016-09-22 DIAGNOSIS — M25661 Stiffness of right knee, not elsewhere classified: Secondary | ICD-10-CM | POA: Diagnosis not present

## 2016-09-22 DIAGNOSIS — M1711 Unilateral primary osteoarthritis, right knee: Secondary | ICD-10-CM | POA: Diagnosis not present

## 2016-09-29 DIAGNOSIS — M1711 Unilateral primary osteoarthritis, right knee: Secondary | ICD-10-CM | POA: Diagnosis not present

## 2016-09-29 DIAGNOSIS — M25561 Pain in right knee: Secondary | ICD-10-CM | POA: Diagnosis not present

## 2016-09-29 DIAGNOSIS — M25661 Stiffness of right knee, not elsewhere classified: Secondary | ICD-10-CM | POA: Diagnosis not present

## 2016-09-29 DIAGNOSIS — R262 Difficulty in walking, not elsewhere classified: Secondary | ICD-10-CM | POA: Diagnosis not present

## 2016-10-06 DIAGNOSIS — R262 Difficulty in walking, not elsewhere classified: Secondary | ICD-10-CM | POA: Diagnosis not present

## 2016-10-06 DIAGNOSIS — M1711 Unilateral primary osteoarthritis, right knee: Secondary | ICD-10-CM | POA: Diagnosis not present

## 2016-10-06 DIAGNOSIS — M25661 Stiffness of right knee, not elsewhere classified: Secondary | ICD-10-CM | POA: Diagnosis not present

## 2016-10-06 DIAGNOSIS — M25561 Pain in right knee: Secondary | ICD-10-CM | POA: Diagnosis not present

## 2016-10-13 DIAGNOSIS — R262 Difficulty in walking, not elsewhere classified: Secondary | ICD-10-CM | POA: Diagnosis not present

## 2016-10-13 DIAGNOSIS — M1711 Unilateral primary osteoarthritis, right knee: Secondary | ICD-10-CM | POA: Diagnosis not present

## 2016-10-13 DIAGNOSIS — M25661 Stiffness of right knee, not elsewhere classified: Secondary | ICD-10-CM | POA: Diagnosis not present

## 2016-10-13 DIAGNOSIS — M25561 Pain in right knee: Secondary | ICD-10-CM | POA: Diagnosis not present

## 2016-11-03 DIAGNOSIS — M25661 Stiffness of right knee, not elsewhere classified: Secondary | ICD-10-CM | POA: Diagnosis not present

## 2016-11-03 DIAGNOSIS — R262 Difficulty in walking, not elsewhere classified: Secondary | ICD-10-CM | POA: Diagnosis not present

## 2016-11-03 DIAGNOSIS — M1711 Unilateral primary osteoarthritis, right knee: Secondary | ICD-10-CM | POA: Diagnosis not present

## 2016-11-03 DIAGNOSIS — M25561 Pain in right knee: Secondary | ICD-10-CM | POA: Diagnosis not present

## 2016-12-03 DIAGNOSIS — K5901 Slow transit constipation: Secondary | ICD-10-CM | POA: Diagnosis not present

## 2016-12-03 DIAGNOSIS — R110 Nausea: Secondary | ICD-10-CM | POA: Diagnosis not present

## 2016-12-22 DIAGNOSIS — M47816 Spondylosis without myelopathy or radiculopathy, lumbar region: Secondary | ICD-10-CM | POA: Diagnosis not present

## 2016-12-22 DIAGNOSIS — M542 Cervicalgia: Secondary | ICD-10-CM | POA: Diagnosis not present

## 2016-12-22 DIAGNOSIS — M47812 Spondylosis without myelopathy or radiculopathy, cervical region: Secondary | ICD-10-CM | POA: Diagnosis not present

## 2016-12-31 DIAGNOSIS — M545 Low back pain: Secondary | ICD-10-CM | POA: Diagnosis not present

## 2016-12-31 DIAGNOSIS — M47896 Other spondylosis, lumbar region: Secondary | ICD-10-CM | POA: Diagnosis not present

## 2017-01-03 DIAGNOSIS — M545 Low back pain: Secondary | ICD-10-CM | POA: Diagnosis not present

## 2017-01-03 DIAGNOSIS — M47896 Other spondylosis, lumbar region: Secondary | ICD-10-CM | POA: Diagnosis not present

## 2017-01-11 DIAGNOSIS — M47896 Other spondylosis, lumbar region: Secondary | ICD-10-CM | POA: Diagnosis not present

## 2017-01-11 DIAGNOSIS — M545 Low back pain: Secondary | ICD-10-CM | POA: Diagnosis not present

## 2017-01-13 DIAGNOSIS — M545 Low back pain: Secondary | ICD-10-CM | POA: Diagnosis not present

## 2017-01-13 DIAGNOSIS — M47896 Other spondylosis, lumbar region: Secondary | ICD-10-CM | POA: Diagnosis not present

## 2017-01-17 DIAGNOSIS — M9902 Segmental and somatic dysfunction of thoracic region: Secondary | ICD-10-CM | POA: Diagnosis not present

## 2017-01-17 DIAGNOSIS — M9901 Segmental and somatic dysfunction of cervical region: Secondary | ICD-10-CM | POA: Diagnosis not present

## 2017-01-17 DIAGNOSIS — M9904 Segmental and somatic dysfunction of sacral region: Secondary | ICD-10-CM | POA: Diagnosis not present

## 2017-01-19 DIAGNOSIS — M9904 Segmental and somatic dysfunction of sacral region: Secondary | ICD-10-CM | POA: Diagnosis not present

## 2017-01-19 DIAGNOSIS — M9902 Segmental and somatic dysfunction of thoracic region: Secondary | ICD-10-CM | POA: Diagnosis not present

## 2017-01-19 DIAGNOSIS — M9901 Segmental and somatic dysfunction of cervical region: Secondary | ICD-10-CM | POA: Diagnosis not present

## 2017-01-20 DIAGNOSIS — M545 Low back pain: Secondary | ICD-10-CM | POA: Diagnosis not present

## 2017-01-20 DIAGNOSIS — M47896 Other spondylosis, lumbar region: Secondary | ICD-10-CM | POA: Diagnosis not present

## 2017-01-24 DIAGNOSIS — M9902 Segmental and somatic dysfunction of thoracic region: Secondary | ICD-10-CM | POA: Diagnosis not present

## 2017-01-24 DIAGNOSIS — M9904 Segmental and somatic dysfunction of sacral region: Secondary | ICD-10-CM | POA: Diagnosis not present

## 2017-01-24 DIAGNOSIS — M9901 Segmental and somatic dysfunction of cervical region: Secondary | ICD-10-CM | POA: Diagnosis not present

## 2017-01-28 DIAGNOSIS — M9902 Segmental and somatic dysfunction of thoracic region: Secondary | ICD-10-CM | POA: Diagnosis not present

## 2017-01-28 DIAGNOSIS — M9901 Segmental and somatic dysfunction of cervical region: Secondary | ICD-10-CM | POA: Diagnosis not present

## 2017-01-28 DIAGNOSIS — M9904 Segmental and somatic dysfunction of sacral region: Secondary | ICD-10-CM | POA: Diagnosis not present

## 2017-02-01 DIAGNOSIS — M9901 Segmental and somatic dysfunction of cervical region: Secondary | ICD-10-CM | POA: Diagnosis not present

## 2017-02-01 DIAGNOSIS — M9904 Segmental and somatic dysfunction of sacral region: Secondary | ICD-10-CM | POA: Diagnosis not present

## 2017-02-01 DIAGNOSIS — M9902 Segmental and somatic dysfunction of thoracic region: Secondary | ICD-10-CM | POA: Diagnosis not present

## 2017-02-08 DIAGNOSIS — M47816 Spondylosis without myelopathy or radiculopathy, lumbar region: Secondary | ICD-10-CM | POA: Diagnosis not present

## 2017-02-11 ENCOUNTER — Telehealth: Payer: Self-pay | Admitting: Cardiology

## 2017-02-11 MED ORDER — ROSUVASTATIN CALCIUM 20 MG PO TABS: 20.0000 mg | ORAL_TABLET | Freq: Every day | ORAL | 0 refills | Status: DC

## 2017-02-11 NOTE — Telephone Encounter (Signed)
Pt's medication was sent to pt's pharmacy as requested. Confirmation received.  °

## 2017-02-11 NOTE — Telephone Encounter (Signed)
New message     *STAT* If patient is at the pharmacy, call can be transferred to refill team.   1. Which medications need to be refilled? (please list name of each medication and dose if known)  rosuvastatin (CRESTOR) 20 MG tablet Take 1 tablet (20 mg total) by mouth daily.    2. Which pharmacy/location (including street and city if local pharmacy) is medication to be sent to? [facility] on [address]   3. Do they need a 30 day or 90 day supply? [place]

## 2017-02-17 DIAGNOSIS — M9901 Segmental and somatic dysfunction of cervical region: Secondary | ICD-10-CM | POA: Diagnosis not present

## 2017-02-17 DIAGNOSIS — M9904 Segmental and somatic dysfunction of sacral region: Secondary | ICD-10-CM | POA: Diagnosis not present

## 2017-02-17 DIAGNOSIS — M9902 Segmental and somatic dysfunction of thoracic region: Secondary | ICD-10-CM | POA: Diagnosis not present

## 2017-02-22 DIAGNOSIS — M47816 Spondylosis without myelopathy or radiculopathy, lumbar region: Secondary | ICD-10-CM | POA: Diagnosis not present

## 2017-03-04 DIAGNOSIS — M9902 Segmental and somatic dysfunction of thoracic region: Secondary | ICD-10-CM | POA: Diagnosis not present

## 2017-03-04 DIAGNOSIS — M9901 Segmental and somatic dysfunction of cervical region: Secondary | ICD-10-CM | POA: Diagnosis not present

## 2017-03-04 DIAGNOSIS — M9904 Segmental and somatic dysfunction of sacral region: Secondary | ICD-10-CM | POA: Diagnosis not present

## 2017-03-16 DIAGNOSIS — M47816 Spondylosis without myelopathy or radiculopathy, lumbar region: Secondary | ICD-10-CM | POA: Diagnosis not present

## 2017-03-16 DIAGNOSIS — M47812 Spondylosis without myelopathy or radiculopathy, cervical region: Secondary | ICD-10-CM | POA: Diagnosis not present

## 2017-03-29 ENCOUNTER — Other Ambulatory Visit: Payer: Self-pay | Admitting: Family Medicine

## 2017-03-29 DIAGNOSIS — Z1231 Encounter for screening mammogram for malignant neoplasm of breast: Secondary | ICD-10-CM

## 2017-04-15 DIAGNOSIS — H2513 Age-related nuclear cataract, bilateral: Secondary | ICD-10-CM | POA: Diagnosis not present

## 2017-04-19 DIAGNOSIS — M47816 Spondylosis without myelopathy or radiculopathy, lumbar region: Secondary | ICD-10-CM | POA: Diagnosis not present

## 2017-05-09 ENCOUNTER — Ambulatory Visit
Admission: RE | Admit: 2017-05-09 | Discharge: 2017-05-09 | Disposition: A | Discharge status: Home / Self Care | Payer: PPO | Source: Ambulatory Visit | Attending: Family Medicine | Admitting: Family Medicine

## 2017-05-09 DIAGNOSIS — N281 Cyst of kidney, acquired: Secondary | ICD-10-CM | POA: Diagnosis not present

## 2017-05-09 DIAGNOSIS — Z1231 Encounter for screening mammogram for malignant neoplasm of breast: Secondary | ICD-10-CM | POA: Diagnosis not present

## 2017-05-16 ENCOUNTER — Encounter: Payer: Self-pay | Admitting: Cardiology

## 2017-05-24 DIAGNOSIS — Z6832 Body mass index (BMI) 32.0-32.9, adult: Secondary | ICD-10-CM | POA: Diagnosis not present

## 2017-05-24 DIAGNOSIS — M47816 Spondylosis without myelopathy or radiculopathy, lumbar region: Secondary | ICD-10-CM | POA: Diagnosis not present

## 2017-05-30 DIAGNOSIS — M85852 Other specified disorders of bone density and structure, left thigh: Secondary | ICD-10-CM | POA: Diagnosis not present

## 2017-05-30 DIAGNOSIS — Z7251 High risk heterosexual behavior: Secondary | ICD-10-CM | POA: Diagnosis not present

## 2017-05-30 DIAGNOSIS — Z9189 Other specified personal risk factors, not elsewhere classified: Secondary | ICD-10-CM | POA: Diagnosis not present

## 2017-05-30 DIAGNOSIS — Z01419 Encounter for gynecological examination (general) (routine) without abnormal findings: Secondary | ICD-10-CM | POA: Diagnosis not present

## 2017-06-02 ENCOUNTER — Encounter: Payer: Self-pay | Admitting: Cardiology

## 2017-06-02 ENCOUNTER — Ambulatory Visit (INDEPENDENT_AMBULATORY_CARE_PROVIDER_SITE_OTHER): Payer: PPO | Admitting: Cardiology

## 2017-06-02 VITALS — BP 120/80 | HR 67 | Ht 64.5 in | Wt 188.0 lb

## 2017-06-02 DIAGNOSIS — E7800 Pure hypercholesterolemia, unspecified: Secondary | ICD-10-CM | POA: Diagnosis not present

## 2017-06-02 DIAGNOSIS — I10 Essential (primary) hypertension: Secondary | ICD-10-CM

## 2017-06-02 DIAGNOSIS — I2510 Atherosclerotic heart disease of native coronary artery without angina pectoris: Secondary | ICD-10-CM | POA: Diagnosis not present

## 2017-06-02 DIAGNOSIS — I493 Ventricular premature depolarization: Secondary | ICD-10-CM | POA: Diagnosis not present

## 2017-06-02 LAB — CBC WITH DIFFERENTIAL/PLATELET
Basophils Absolute: 0 10*3/uL (ref 0.0–0.2)
Basos: 0 %
EOS (ABSOLUTE): 0.1 10*3/uL (ref 0.0–0.4)
Eos: 1 %
Hematocrit: 40.4 % (ref 34.0–46.6)
Hemoglobin: 13.3 g/dL (ref 11.1–15.9)
Immature Grans (Abs): 0 10*3/uL (ref 0.0–0.1)
Immature Granulocytes: 0 %
Lymphocytes Absolute: 2.3 10*3/uL (ref 0.7–3.1)
Lymphs: 24 %
MCH: 30.3 pg (ref 26.6–33.0)
MCHC: 32.9 g/dL (ref 31.5–35.7)
MCV: 92 fL (ref 79–97)
Monocytes Absolute: 0.8 10*3/uL (ref 0.1–0.9)
Monocytes: 9 %
Neutrophils Absolute: 6.3 10*3/uL (ref 1.4–7.0)
Neutrophils: 66 %
Platelets: 362 10*3/uL (ref 150–379)
RBC: 4.39 x10E6/uL (ref 3.77–5.28)
RDW: 13.4 % (ref 12.3–15.4)
WBC: 9.5 10*3/uL (ref 3.4–10.8)

## 2017-06-02 LAB — COMPREHENSIVE METABOLIC PANEL
ALT: 13 IU/L (ref 0–32)
AST: 20 IU/L (ref 0–40)
Albumin/Globulin Ratio: 1.7 (ref 1.2–2.2)
Albumin: 4.7 g/dL (ref 3.5–4.8)
Alkaline Phosphatase: 67 IU/L (ref 39–117)
BUN/Creatinine Ratio: 14 (ref 12–28)
BUN: 11 mg/dL (ref 8–27)
Bilirubin Total: 0.3 mg/dL (ref 0.0–1.2)
CO2: 23 mmol/L (ref 20–29)
Calcium: 10 mg/dL (ref 8.7–10.3)
Chloride: 99 mmol/L (ref 96–106)
Creatinine, Ser: 0.78 mg/dL (ref 0.57–1.00)
GFR calc Af Amer: 87 mL/min/{1.73_m2} (ref >59)
GFR calc non Af Amer: 75 mL/min/{1.73_m2} (ref >59)
Globulin, Total: 2.7 g/dL (ref 1.5–4.5)
Glucose: 153 mg/dL — ABNORMAL HIGH (ref 65–99)
Potassium: 3.5 mmol/L (ref 3.5–5.2)
Sodium: 140 mmol/L (ref 134–144)
Total Protein: 7.4 g/dL (ref 6.0–8.5)

## 2017-06-02 LAB — LIPID PANEL
Chol/HDL Ratio: 2.6 ratio (ref 0.0–4.4)
Cholesterol, Total: 146 mg/dL (ref 100–199)
HDL: 56 mg/dL (ref >39)
LDL Calculated: 70 mg/dL (ref 0–99)
Triglycerides: 99 mg/dL (ref 0–149)
VLDL Cholesterol Cal: 20 mg/dL (ref 5–40)

## 2017-06-02 LAB — TSH: TSH: 2.26 u[IU]/mL (ref 0.450–4.500)

## 2017-06-02 MED ORDER — ASPIRIN EC 81 MG PO TBEC: 81.0000 mg | DELAYED_RELEASE_TABLET | Freq: Every day | ORAL | 3 refills | Status: DC

## 2017-06-02 MED ORDER — ROSUVASTATIN CALCIUM 20 MG PO TABS: 20.0000 mg | ORAL_TABLET | Freq: Every day | ORAL | 3 refills | Status: DC

## 2017-06-02 NOTE — Patient Instructions (Signed)
Medication Instructions:   Your physician recommends that you continue on your current medications as directed. Please refer to the Current Medication list given to you today.    Labwork:  TODAY--CMET, CBC W DIFF, TSH, AND LIPID PROFILE       Follow-Up:  Your physician wants you to follow-up in: [place] will receive a reminder letter in the mail two months in advance. If you don't receive a letter, please call our office to schedule the follow-up appointment.        If you need a refill on your cardiac medications before your next appointment, please call your pharmacy.

## 2017-06-02 NOTE — Progress Notes (Signed)
Cardiology Office Note    Date:  06/02/2017   ID:  Andrea Farmer, DOB 28-Jun-1941, MRN [id number]  PCP:  Lawerance Cruel, MD  Cardiologist: Ena Dawley, MD  Referring physician: Melinda Crutch, MD  Chief complaint: PVCs, shortness of breath.  History of Present Illness:  Andrea Farmer is a 76 y.o. female with prior medical history of hyperlipidemia, hypertension, who recently underwent screening colonoscopy and observed that she has an extra beats on her telemetry. She states that every once a while she feels a few second lasting palpitations, however there no associated with any chest pain, dizziness or syncope. The patient states that she has always been very active but lately has noticed that certain activities take her longer or she has to take a break in between. This is a very slow gradual change rather than a abrupt change she still able to do all activities of daily living without any significant problems.  She doesn't have a family history of sudden cardiac death or coronary artery disease. She has a very distant history of smoking.  06/02/2017 - 1 year follow up, The patient underwent 24 hour Holter monitoring that showed 700 PVCs in 24 hours, sinus bradycardia to sinus rhythm. LVEF 60-65% on echocardiogram. She underwent coronary CT angiography that showed coronary calcium score of 681. This was 74 percentile for age and sex matched control. Normal coronary origin.  Right dominance. 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 An aggressive medical management is recommended. She was started on Crestor 20 mg daily that she is tolerating well. She exercises on a bicycle 3 times a week. She is difficulty with walking as an exercise as she had bilateral knee replacement. She denies any chest pain or shortness of breath, she eats very healthy Mediterranean style diet. No further palpitations.   Past Medical History:  Diagnosis Date  . Acid reflux   . Back  pain    arthritis  . Cataract    immature but doesn't know which eye  . High cholesterol    takes Crestor daily  . History of colon polyps    benign  . History of migraine   . Hypertension    takes Lotrel and HCTZ dailydaily  . Irritable bowel syndrome 03/11/2016  . Joint pain   . Joint swelling   . Osteoarthritis of knee 03/11/2016  . Pneumonia    30 plus yrs ago    Past Surgical History:  Procedure Laterality Date  . ABDOMINAL HYSTERECTOMY    . BREAST BIOPSY Right 05/14/2015  . BREAST EXCISIONAL BIOPSY Left   . BREAST EXCISIONAL BIOPSY Right   . RECTAL SURGERY     x 3  . REPLACEMENT TOTAL KNEE BILATERAL    . SYNOVECTOMY Left 07/21/2015   Procedure: LEFT KNEE SYNOVECTOMY with POLY EXCHANGE;  Surgeon: Vickey Huger, MD;  Location: [facility];  Service: Orthopedics;  Laterality: Left;  . TOTAL KNEE REVISION Right 07/05/2016   Procedure: TOTAL KNEE REVISION WITH SCAR DEBRIDEMENT/PATELLA REVISION WITH POLY EXCHANGE;  Surgeon: Vickey Huger, MD;  Location: [facility];  Service: Orthopedics;  Laterality: Right;    Current Medications: Outpatient Medications Prior to Visit  Medication Sig Dispense Refill  . amLODipine-benazepril (LOTREL) 5-40 MG capsule Take 1 capsule by mouth daily.    . Calcium Carbonate-Vitamin D (CALCIUM 600+D) 600-400 MG-UNIT tablet Take 1 tablet by mouth 3 (three) times a week.     . Coenzyme Q10 (CO  Q 10 PO) Take 200 mg by mouth every other day.     . fexofenadine (ALLEGRA) 180 MG tablet Take 180 mg by mouth daily as needed for allergies or rhinitis.    . Glucosamine-Chondroit-Vit C-Mn (GLUCOSAMINE 1500 COMPLEX PO) Take 1 tablet by mouth every other day.    . hydrochlorothiazide (HYDRODIURIL) 25 MG tablet Take 25 mg by mouth daily.    . hyoscyamine (LEVSIN SL) 0.125 MG SL tablet Place 0.125 mg under the tongue as needed for cramping (before meals(dissolve on tongue)).     . mometasone (NASONEX) 50 MCG/ACT nasal spray Place 2 sprays into the nose 3 (three) times a week.       . Multiple Vitamin (MULTIVITAMIN WITH MINERALS) TABS tablet Take 1 tablet by mouth 3 (three) times a week.     . Omega-3 Fatty Acids (OMEGA 3 PO) Take 1 capsule by mouth 3 (three) times a week.     Marland Kitchen OVER THE COUNTER MEDICATION Take 1 tablet by mouth daily. "LA-3 Live Cell" supplement    . Plant Sterols and Stanols (CHOLESTOFF PLUS PO) Take 1 tablet by mouth daily.    . Probiotic Product (ALIGN) 4 MG CAPS Take 4 mg by mouth daily as needed. supplement    . traMADol (ULTRAM) 50 MG tablet Take 1-2 tablets (50-100 mg total) by mouth every 6 (six) hours. 60 tablet 0  . TURMERIC PO Take 500 mg by mouth every other day.    . vitamin E 400 UNIT capsule Take 400 Units by mouth every other day.    Marland Kitchen aspirin EC 325 MG EC tablet Take 1 tablet (325 mg total) by mouth 2 (two) times daily. 30 tablet 0  . rosuvastatin (CRESTOR) 20 MG tablet Take 1 tablet (20 mg total) by mouth daily. 90 tablet 0  . Cholecalciferol (VITAMIN D PO) Take 2,000 Units by mouth every other day.     . Cyanocobalamin (VITAMIN B-12 PO) Take 1 tablet by mouth every other day.    . methocarbamol (ROBAXIN) 500 MG tablet Take 1-2 tablets (500-1,000 mg total) by mouth every 6 (six) hours as needed for muscle spasms. (Patient not taking: Reported on 06/02/2017) 60 tablet 0  . oxyCODONE (OXY IR/ROXICODONE) 5 MG immediate release tablet Take 1-2 tablets (5-10 mg total) by mouth every 3 (three) hours as needed for breakthrough pain. (Patient not taking: Reported on 06/02/2017) 30 tablet 0   No facility-administered medications prior to visit.      Allergies:   No known allergies   Social History   Social History  . Marital status: Divorced    Spouse name: N/A  . Number of children: N/A  . Years of education: N/A   Social History Main Topics  . Smoking status: Never Smoker  . Smokeless tobacco: Never Used  . Alcohol use Yes     Comment: WINE OCC  . Drug use: No  . Sexual activity: Not Asked   Other Topics Concern  . None    Social History Narrative  . None     Family History:  The patient's family history includes Aneurysm in her father; Cancer in her mother.   ROS:   Please see the history of present illness.    ROS All other systems reviewed and are negative.   PHYSICAL EXAM:   VS:  BP 120/80   Pulse 67   Ht 5' 4.5" (1.638 m)   Wt 188 lb (85.3 kg)   BMI 31.77 kg/m    GEN:  Well nourished, well developed, in no acute distress  HEENT: normal  Neck: no JVD, carotid bruits, or masses Cardiac: RRR; no murmurs, rubs, or gallops,no edema  Respiratory:  clear to auscultation bilaterally, normal work of breathing GI: soft, nontender, nondistended, + BS MS: no deformity or atrophy  Skin: warm and dry, no rash Neuro:  Alert and Oriented x 3, Strength and sensation are intact Psych: euthymic mood, full affect  Wt Readings from Last 3 Encounters:  06/02/17 188 lb (85.3 kg)  07/05/16 196 lb 11 oz (89.2 kg)  06/25/16 196 lb 11.2 oz (89.2 kg)     Studies/Labs Reviewed:   EKG:  EKG is ordered today.  And personally reviewed. Normal sinus rhythm otherwise normal EKG when compared to last one PVCs are no longer present.  Recent Labs: 06/25/2016: ALT 20 07/06/2016: BUN 6; Creatinine, Ser 0.64; Hemoglobin 11.8; Platelets 266; Potassium 3.9; Sodium 137   Lipid Panel    Component Value Date/Time   CHOL 164 05/20/2016 1127   TRIG 88 05/20/2016 1127   HDL 55 05/20/2016 1127   CHOLHDL 3.0 05/20/2016 1127   LDLCALC 91 05/20/2016 1127   Additional studies/ records that were reviewed today include:  As per history of present illness.  TTE: 03/12/16 - Left ventricle: The cavity size was normal. Systolic function was   normal. The estimated ejection fraction was in the range of 60%   to 65%. Wall motion was normal; there were no regional wall   motion abnormalities. Doppler parameters are consistent with   abnormal left ventricular relaxation (grade 1 diastolic   dysfunction). Doppler parameters are  consistent with elevated   ventricular end-diastolic filling pressure. - Aortic valve: Trileaflet; normal thickness leaflets. There was no   regurgitation. - Aortic root: The aortic root was normal in size. - Mitral valve: Calcified annulus. Mildly thickened leaflets .   There was mild regurgitation. - Left atrium: The atrium was normal in size. - Right ventricle: Systolic function was normal. - Tricuspid valve: There was mild regurgitation. - Pulmonary arteries: Systolic pressure was mildly increased. PA   peak pressure: 40 mm Hg (S). - Inferior vena cava: The vessel was normal in size. - Pericardium, extracardiac: There was no pericardial effusion.    ASSESSMENT:    1. Pure hypercholesterolemia   2. Essential (primary) hypertension   3. Coronary artery disease involving native coronary artery of native heart without angina pectoris   4. PVC (premature ventricular contraction)      PLAN:  In order of problems listed above:  1. CAD, continue the same medical management with low-dose aspirin and Crestor, we'll recheck CMP and lipids today. She is educated about proper diet and exercise, she is encouraged to increase her cycling to 5 times a week. 2. Blood pressure well controlled. 3. Hyperlipidemia - started on Crestor 20 mg daily, we'll recheck today along with CMP.   Medication Adjustments/Labs and Tests Ordered: Current medicines are reviewed at length with the patient today.  Concerns regarding medicines are outlined above.  Medication changes, Labs and Tests ordered today are listed in the Patient Instructions below. Patient Instructions  Medication Instructions:   Your physician recommends that you continue on your current medications as directed. Please refer to the Current Medication list given to you today.    Labwork:  TODAY--CMET, CBC W DIFF, TSH, AND LIPID PROFILE       Follow-Up:  Your physician wants you to follow-up in: [place]  will receive  a reminder letter in the mail two months in advance. If you don't receive a letter, please call our office to schedule the follow-up appointment.        If you need a refill on your cardiac medications before your next appointment, please call your pharmacy.      Signed, Ena Dawley, MD  06/02/2017 8:56 AM    [address], [place], [place]  [address] Phone: [phone]; Fax: [phone]

## 2017-06-03 ENCOUNTER — Telehealth: Payer: Self-pay | Admitting: Cardiology

## 2017-06-03 NOTE — Telephone Encounter (Signed)
Attempted to call the pt back to endorse lab results per Dr Meda Coffee, and no answer.  Will endorse the pts labs on her active mychart account.

## 2017-06-03 NOTE — Telephone Encounter (Signed)
New message      Calling to get lab results.  Please call before you leave today.  She will wait for your call

## 2017-06-03 NOTE — Telephone Encounter (Signed)
All labs normal except elevated blood glucose, normal CBC, CMP, TSH, excellent lipids.

## 2017-06-03 NOTE — Telephone Encounter (Signed)
Pt calling to go over her lab results that were drawn by our office yesterday.  Pt saw them on mychart, but hasn't heard from our office.  Went over the preliminary review with the pt.  Informed the pt that once Dr Meda Coffee reviews and and advises on her labs, someone from the office will follow-up with her shortly thereafter.  Pt verbalized understanding and agrees with this plan.

## 2017-06-08 DIAGNOSIS — Z0000 Encounter for general adult medical examination without abnormal findings: Secondary | ICD-10-CM | POA: Diagnosis not present

## 2017-06-08 DIAGNOSIS — I10 Essential (primary) hypertension: Secondary | ICD-10-CM | POA: Diagnosis not present

## 2017-06-08 DIAGNOSIS — Z23 Encounter for immunization: Secondary | ICD-10-CM | POA: Diagnosis not present

## 2017-06-08 DIAGNOSIS — R7309 Other abnormal glucose: Secondary | ICD-10-CM | POA: Diagnosis not present

## 2017-07-05 DIAGNOSIS — M8588 Other specified disorders of bone density and structure, other site: Secondary | ICD-10-CM | POA: Diagnosis not present

## 2017-07-22 DIAGNOSIS — L821 Other seborrheic keratosis: Secondary | ICD-10-CM | POA: Diagnosis not present

## 2017-07-22 DIAGNOSIS — D2262 Melanocytic nevi of left upper limb, including shoulder: Secondary | ICD-10-CM | POA: Diagnosis not present

## 2017-07-22 DIAGNOSIS — L814 Other melanin hyperpigmentation: Secondary | ICD-10-CM | POA: Diagnosis not present

## 2017-07-22 DIAGNOSIS — D2272 Melanocytic nevi of left lower limb, including hip: Secondary | ICD-10-CM | POA: Diagnosis not present

## 2017-07-22 DIAGNOSIS — D225 Melanocytic nevi of trunk: Secondary | ICD-10-CM | POA: Diagnosis not present

## 2017-07-22 DIAGNOSIS — D1801 Hemangioma of skin and subcutaneous tissue: Secondary | ICD-10-CM | POA: Diagnosis not present

## 2017-07-22 DIAGNOSIS — D2271 Melanocytic nevi of right lower limb, including hip: Secondary | ICD-10-CM | POA: Diagnosis not present

## 2017-07-22 DIAGNOSIS — L570 Actinic keratosis: Secondary | ICD-10-CM | POA: Diagnosis not present

## 2017-07-22 DIAGNOSIS — D692 Other nonthrombocytopenic purpura: Secondary | ICD-10-CM | POA: Diagnosis not present

## 2017-07-28 DIAGNOSIS — M47816 Spondylosis without myelopathy or radiculopathy, lumbar region: Secondary | ICD-10-CM | POA: Diagnosis not present

## 2017-08-25 DIAGNOSIS — M47816 Spondylosis without myelopathy or radiculopathy, lumbar region: Secondary | ICD-10-CM | POA: Diagnosis not present

## 2017-08-25 DIAGNOSIS — M47812 Spondylosis without myelopathy or radiculopathy, cervical region: Secondary | ICD-10-CM | POA: Diagnosis not present

## 2017-08-25 DIAGNOSIS — Z6833 Body mass index (BMI) 33.0-33.9, adult: Secondary | ICD-10-CM | POA: Diagnosis not present

## 2017-10-31 DIAGNOSIS — M47812 Spondylosis without myelopathy or radiculopathy, cervical region: Secondary | ICD-10-CM | POA: Diagnosis not present

## 2017-11-08 DIAGNOSIS — R143 Flatulence: Secondary | ICD-10-CM | POA: Diagnosis not present

## 2017-11-08 DIAGNOSIS — R1013 Epigastric pain: Secondary | ICD-10-CM | POA: Diagnosis not present

## 2017-11-17 DIAGNOSIS — M47816 Spondylosis without myelopathy or radiculopathy, lumbar region: Secondary | ICD-10-CM | POA: Diagnosis not present

## 2017-11-17 DIAGNOSIS — M47812 Spondylosis without myelopathy or radiculopathy, cervical region: Secondary | ICD-10-CM | POA: Diagnosis not present

## 2017-11-17 DIAGNOSIS — Z6832 Body mass index (BMI) 32.0-32.9, adult: Secondary | ICD-10-CM | POA: Diagnosis not present

## 2017-12-29 DIAGNOSIS — M47816 Spondylosis without myelopathy or radiculopathy, lumbar region: Secondary | ICD-10-CM | POA: Diagnosis not present

## 2018-02-06 DIAGNOSIS — M47812 Spondylosis without myelopathy or radiculopathy, cervical region: Secondary | ICD-10-CM | POA: Diagnosis not present

## 2018-03-14 ENCOUNTER — Other Ambulatory Visit: Payer: Self-pay | Admitting: Family Medicine

## 2018-03-14 DIAGNOSIS — Z1231 Encounter for screening mammogram for malignant neoplasm of breast: Secondary | ICD-10-CM

## 2018-03-30 DIAGNOSIS — Z6836 Body mass index (BMI) 36.0-36.9, adult: Secondary | ICD-10-CM | POA: Diagnosis not present

## 2018-03-30 DIAGNOSIS — M47816 Spondylosis without myelopathy or radiculopathy, lumbar region: Secondary | ICD-10-CM | POA: Diagnosis not present

## 2018-03-30 DIAGNOSIS — M47812 Spondylosis without myelopathy or radiculopathy, cervical region: Secondary | ICD-10-CM | POA: Diagnosis not present

## 2018-04-17 DIAGNOSIS — H2513 Age-related nuclear cataract, bilateral: Secondary | ICD-10-CM | POA: Diagnosis not present

## 2018-04-19 DIAGNOSIS — N898 Other specified noninflammatory disorders of vagina: Secondary | ICD-10-CM | POA: Diagnosis not present

## 2018-04-19 DIAGNOSIS — N952 Postmenopausal atrophic vaginitis: Secondary | ICD-10-CM | POA: Diagnosis not present

## 2018-04-27 DIAGNOSIS — M47816 Spondylosis without myelopathy or radiculopathy, lumbar region: Secondary | ICD-10-CM | POA: Diagnosis not present

## 2018-05-10 ENCOUNTER — Ambulatory Visit
Admission: RE | Admit: 2018-05-10 | Discharge: 2018-05-10 | Disposition: A | Discharge status: Home / Self Care | Payer: PPO | Source: Ambulatory Visit | Attending: Family Medicine | Admitting: Family Medicine

## 2018-05-10 DIAGNOSIS — Z1231 Encounter for screening mammogram for malignant neoplasm of breast: Secondary | ICD-10-CM | POA: Diagnosis not present

## 2018-05-11 DIAGNOSIS — R400 Somnolence: Secondary | ICD-10-CM | POA: Diagnosis not present

## 2018-05-24 ENCOUNTER — Other Ambulatory Visit: Payer: Self-pay | Admitting: Cardiology

## 2018-05-24 DIAGNOSIS — I10 Essential (primary) hypertension: Secondary | ICD-10-CM

## 2018-05-24 DIAGNOSIS — E7800 Pure hypercholesterolemia, unspecified: Secondary | ICD-10-CM

## 2018-06-05 DIAGNOSIS — Z9189 Other specified personal risk factors, not elsewhere classified: Secondary | ICD-10-CM | POA: Diagnosis not present

## 2018-06-05 DIAGNOSIS — Z7251 High risk heterosexual behavior: Secondary | ICD-10-CM | POA: Diagnosis not present

## 2018-06-05 DIAGNOSIS — N952 Postmenopausal atrophic vaginitis: Secondary | ICD-10-CM | POA: Diagnosis not present

## 2018-06-05 DIAGNOSIS — N898 Other specified noninflammatory disorders of vagina: Secondary | ICD-10-CM | POA: Diagnosis not present

## 2018-06-08 ENCOUNTER — Encounter: Payer: Self-pay | Admitting: Cardiology

## 2018-06-08 ENCOUNTER — Ambulatory Visit (INDEPENDENT_AMBULATORY_CARE_PROVIDER_SITE_OTHER): Payer: PPO | Admitting: Cardiology

## 2018-06-08 VITALS — BP 110/80 | HR 60 | Ht 64.5 in | Wt 185.1 lb

## 2018-06-08 DIAGNOSIS — I2510 Atherosclerotic heart disease of native coronary artery without angina pectoris: Secondary | ICD-10-CM | POA: Diagnosis not present

## 2018-06-08 DIAGNOSIS — I10 Essential (primary) hypertension: Secondary | ICD-10-CM | POA: Diagnosis not present

## 2018-06-08 DIAGNOSIS — E7800 Pure hypercholesterolemia, unspecified: Secondary | ICD-10-CM | POA: Diagnosis not present

## 2018-06-08 MED ORDER — ROSUVASTATIN CALCIUM 20 MG PO TABS: 20.0000 mg | ORAL_TABLET | Freq: Every day | ORAL | 0 refills | Status: DC

## 2018-06-08 NOTE — Progress Notes (Signed)
Cardiology Office Note    Date:  06/08/2018   ID:  Andrea Farmer, DOB 1941/02/19, MRN [id number]  PCP:  Lawerance Cruel, MD  Cardiologist: Ena Dawley, MD  Referring physician: Melinda Crutch, MD  Chief complaint: PVCs, shortness of breath.  History of Present Illness:  Andrea Farmer is a 77 y.o. female with prior medical history of hyperlipidemia, hypertension, who recently underwent screening colonoscopy and observed that she has an extra beats on her telemetry. She states that every once a while she feels a few second lasting palpitations, however there no associated with any chest pain, dizziness or syncope. The patient states that she has always been very active but lately has noticed that certain activities take her longer or she has to take a break in between. This is a very slow gradual change rather than a abrupt change she still able to do all activities of daily living without any significant problems.  She doesn't have a family history of sudden cardiac death or coronary artery disease. She has a very distant history of smoking.  06/02/2017 - 1 year follow up, The patient underwent 24 hour Holter monitoring that showed 700 PVCs in 24 hours, sinus bradycardia to sinus rhythm. LVEF 60-65% on echocardiogram. She underwent coronary CT angiography that showed coronary calcium score of 681. This was 32 percentile for age and sex matched control. Normal coronary origin.  Right dominance. 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 An aggressive medical management is recommended. She was started on Crestor 20 mg daily that she is tolerating well. She exercises on a bicycle 3 times a week. She is difficulty with walking as an exercise as she had bilateral knee replacement. She denies any chest pain or shortness of breath, she eats very healthy Mediterranean style diet. No further palpitations.  06/07/2018 -1 year follow-up, she feels and looks great, she just came  from 1 months along road trip to [place] and back and she greatly enjoyed it.  She denies any chest pain shortness of breath no lower extremity edema orthopnea proximal nocturnal dyspnea no palpitations.  She is compliant with her medications.  She needs refills today.   Past Medical History:  Diagnosis Date  . Acid reflux   . Back pain    arthritis  . Cataract    immature but doesn't know which eye  . High cholesterol    takes Crestor daily  . History of colon polyps    benign  . History of migraine   . Hypertension    takes Lotrel and HCTZ dailydaily  . Irritable bowel syndrome 03/11/2016  . Joint pain   . Joint swelling   . Osteoarthritis of knee 03/11/2016  . Pneumonia    30 plus yrs ago    Past Surgical History:  Procedure Laterality Date  . ABDOMINAL HYSTERECTOMY    . BREAST BIOPSY Right 05/14/2015  . BREAST EXCISIONAL BIOPSY Left   . BREAST EXCISIONAL BIOPSY Right   . RECTAL SURGERY     x 3  . REPLACEMENT TOTAL KNEE BILATERAL    . SYNOVECTOMY Left 07/21/2015   Procedure: LEFT KNEE SYNOVECTOMY with POLY EXCHANGE;  Surgeon: Vickey Huger, MD;  Location: [facility];  Service: Orthopedics;  Laterality: Left;  . TOTAL KNEE REVISION Right 07/05/2016   Procedure: TOTAL KNEE REVISION WITH SCAR DEBRIDEMENT/PATELLA REVISION WITH POLY EXCHANGE;  Surgeon: Vickey Huger, MD;  Location: [facility];  Service: Orthopedics;  Laterality:  Right;    Current Medications: Outpatient Medications Prior to Visit  Medication Sig Dispense Refill  . amLODipine-benazepril (LOTREL) 5-40 MG capsule Take 1 capsule by mouth daily.    Marland Kitchen aspirin EC 81 MG tablet Take 1 tablet (81 mg total) by mouth daily. 90 tablet 3  . Calcium Carbonate-Vitamin D (CALCIUM 600+D) 600-400 MG-UNIT tablet Take 1 tablet by mouth 3 (three) times a week.     . Coenzyme Q10 (CO Q 10 PO) Take 200 mg by mouth every other day.     . fexofenadine (ALLEGRA) 180 MG tablet Take 180 mg by mouth daily as needed for allergies or rhinitis.    .  Glucosamine-Chondroit-Vit C-Mn (GLUCOSAMINE 1500 COMPLEX PO) Take 1 tablet by mouth every other day.    . hydrochlorothiazide (HYDRODIURIL) 25 MG tablet Take 25 mg by mouth daily.    . hyoscyamine (LEVSIN SL) 0.125 MG SL tablet Place 0.125 mg under the tongue as needed for cramping (before meals(dissolve on tongue)).     . mometasone (NASONEX) 50 MCG/ACT nasal spray Place 2 sprays into the nose 3 (three) times a week.     . Multiple Vitamin (MULTIVITAMIN WITH MINERALS) TABS tablet Take 1 tablet by mouth 3 (three) times a week.     . Omega-3 Fatty Acids (OMEGA 3 PO) Take 1 capsule by mouth 3 (three) times a week.     Marland Kitchen OVER THE COUNTER MEDICATION Take 1 tablet by mouth daily. "LA-3 Live Cell" supplement    . Plant Sterols and Stanols (CHOLESTOFF PLUS PO) Take 1 tablet by mouth daily.    . Probiotic Product (ALIGN) 4 MG CAPS Take 4 mg by mouth daily as needed. supplement    . rosuvastatin (CRESTOR) 20 MG tablet Take 1 tablet (20 mg total) by mouth daily. 90 tablet 0  . traMADol (ULTRAM) 50 MG tablet Take 1-2 tablets (50-100 mg total) by mouth every 6 (six) hours. 60 tablet 0  . TURMERIC PO Take 500 mg by mouth every other day.    . vitamin E 400 UNIT capsule Take 400 Units by mouth every other day.     No facility-administered medications prior to visit.      Allergies:   No known allergies   Social History   Socioeconomic History  . Marital status: Divorced    Spouse name: Not on file  . Number of children: Not on file  . Years of education: Not on file  . Highest education level: Not on file  Occupational History  . Not on file  Social Needs  . Financial resource strain: Not on file  . Food insecurity:    Worry: Not on file    Inability: Not on file  . Transportation needs:    Medical: Not on file    Non-medical: Not on file  Tobacco Use  . Smoking status: Never Smoker  . Smokeless tobacco: Never Used  Substance and Sexual Activity  . Alcohol use: Yes    Comment: WINE OCC    . Drug use: No  . Sexual activity: Not on file  Lifestyle  . Physical activity:    Days per week: Not on file    Minutes per session: Not on file  . Stress: Not on file  Relationships  . Social connections:    Talks on phone: Not on file    Gets together: Not on file    Attends religious service: Not on file    Active member of club or organization: Not  on file    Attends meetings of clubs or organizations: Not on file    Relationship status: Not on file  Other Topics Concern  . Not on file  Social History Narrative  . Not on file     Family History:  The patient's family history includes Aneurysm in her father; Cancer in her mother.   ROS:   Please see the history of present illness.    ROS All other systems reviewed and are negative.   PHYSICAL EXAM:   VS:  BP 110/80   Pulse 60   Ht 5' 4.5" (1.638 m)   Wt 185 lb 1.9 oz (84 kg)   SpO2 96%   BMI 31.29 kg/m    GEN: Well nourished, well developed, in no acute distress  HEENT: normal  Neck: no JVD, carotid bruits, or masses Cardiac: RRR; no murmurs, rubs, or gallops,no edema  Respiratory:  clear to auscultation bilaterally, normal work of breathing GI: soft, nontender, nondistended, + BS MS: no deformity or atrophy  Skin: warm and dry, no rash Neuro:  Alert and Oriented x 3, Strength and sensation are intact Psych: euthymic mood, full affect  Wt Readings from Last 3 Encounters:  06/08/18 185 lb 1.9 oz (84 kg)  06/02/17 188 lb (85.3 kg)  07/05/16 196 lb 11 oz (89.2 kg)     Studies/Labs Reviewed:   EKG:  EKG is ordered today.  And personally reviewed. Normal sinus rhythm otherwise normal EKG when compared to last one PVCs are no longer present.  Recent Labs: No results found for requested labs within last 8760 hours.   Lipid Panel    Component Value Date/Time   CHOL 146 06/02/2017 0901   CHOL 164 05/20/2016 1127   TRIG 99 06/02/2017 0901   TRIG 88 05/20/2016 1127   HDL 56 06/02/2017 0901   HDL 55  05/20/2016 1127   CHOLHDL 2.6 06/02/2017 0901   CHOLHDL 3.0 05/20/2016 1127   LDLCALC 70 06/02/2017 0901   LDLCALC 91 05/20/2016 1127   Additional studies/ records that were reviewed today include:  As per history of present illness.  TTE: 03/12/16 - Left ventricle: The cavity size was normal. Systolic function was   normal. The estimated ejection fraction was in the range of 60%   to 65%. Wall motion was normal; there were no regional wall   motion abnormalities. Doppler parameters are consistent with   abnormal left ventricular relaxation (grade 1 diastolic   dysfunction). Doppler parameters are consistent with elevated   ventricular end-diastolic filling pressure. - Aortic valve: Trileaflet; normal thickness leaflets. There was no   regurgitation. - Aortic root: The aortic root was normal in size. - Mitral valve: Calcified annulus. Mildly thickened leaflets .   There was mild regurgitation. - Left atrium: The atrium was normal in size. - Right ventricle: Systolic function was normal. - Tricuspid valve: There was mild regurgitation. - Pulmonary arteries: Systolic pressure was mildly increased. PA   peak pressure: 40 mm Hg (S). - Inferior vena cava: The vessel was normal in size. - Pericardium, extracardiac: There was no pericardial effusion.  EKG performed today 06/07/2017 was personally reviewed which shows normal sinus rhythm normal EKG unchanged from prior.  ASSESSMENT:    1. Coronary artery disease involving native coronary artery of native heart without angina pectoris   2. Pure hypercholesterolemia   3. Essential (primary) hypertension      PLAN:  In order of problems listed above:  1. CAD, moderate nonobstructive coronary CTA,  continue aspirin, Crestor, amlodipine, she is asymptomatic.  Her EKG is completely normal.   2.  Hypertension -well-controlled on current regimen.   3.  Lipidemia tolerating Crestor, repeat LFTs and lipids today.   4.  Impaired glucose  tolerance -recheck glucose and hemoglobin A1c today.  Medication Adjustments/Labs and Tests Ordered: Current medicines are reviewed at length with the patient today.  Concerns regarding medicines are outlined above.  Medication changes, Labs and Tests ordered today are listed in the Patient Instructions below. There are no Patient Instructions on file for this visit.   Signed, Ena Dawley, MD  06/08/2018 10:07 AM    [facility], [place], [place]  [address] Phone: [phone]; Fax: [phone]

## 2018-06-08 NOTE — Patient Instructions (Signed)
Medication Instructions:   Your physician recommends that you continue on your current medications as directed. Please refer to the Current Medication list given to you today.    Labwork:  TODAY--CMET, TSH, CBC W DIFF, LIPIDS, AND HEMOGLOBIN A1C     Follow-Up:  Your physician wants you to follow-up in: [place] will receive a reminder letter in the mail two months in advance. If you don't receive a letter, please call our office to schedule the follow-up appointment.        If you need a refill on your cardiac medications before your next appointment, please call your pharmacy.

## 2018-06-09 LAB — CBC WITH DIFFERENTIAL/PLATELET
Basophils Absolute: 0 10*3/uL (ref 0.0–0.2)
Basos: 0 %
EOS (ABSOLUTE): 0.1 10*3/uL (ref 0.0–0.4)
Eos: 1 %
Hematocrit: 40.4 % (ref 34.0–46.6)
Hemoglobin: 14.1 g/dL (ref 11.1–15.9)
Immature Grans (Abs): 0 10*3/uL (ref 0.0–0.1)
Immature Granulocytes: 0 %
Lymphocytes Absolute: 2.2 10*3/uL (ref 0.7–3.1)
Lymphs: 27 %
MCH: 30.7 pg (ref 26.6–33.0)
MCHC: 34.9 g/dL (ref 31.5–35.7)
MCV: 88 fL (ref 79–97)
Monocytes Absolute: 0.7 10*3/uL (ref 0.1–0.9)
Monocytes: 9 %
Neutrophils Absolute: 5.2 10*3/uL (ref 1.4–7.0)
Neutrophils: 63 %
Platelets: 353 10*3/uL (ref 150–450)
RBC: 4.6 x10E6/uL (ref 3.77–5.28)
RDW: 13.8 % (ref 12.3–15.4)
WBC: 8.2 10*3/uL (ref 3.4–10.8)

## 2018-06-09 LAB — COMPREHENSIVE METABOLIC PANEL
ALT: 20 IU/L (ref 0–32)
AST: 20 IU/L (ref 0–40)
Albumin/Globulin Ratio: 1.9 (ref 1.2–2.2)
Albumin: 4.9 g/dL — ABNORMAL HIGH (ref 3.5–4.8)
Alkaline Phosphatase: 73 IU/L (ref 39–117)
BUN/Creatinine Ratio: 28 (ref 12–28)
BUN: 20 mg/dL (ref 8–27)
Bilirubin Total: 0.3 mg/dL (ref 0.0–1.2)
CO2: 21 mmol/L (ref 20–29)
Calcium: 10.4 mg/dL — ABNORMAL HIGH (ref 8.7–10.3)
Chloride: 99 mmol/L (ref 96–106)
Creatinine, Ser: 0.72 mg/dL (ref 0.57–1.00)
GFR calc Af Amer: 95 mL/min/{1.73_m2} (ref >59)
GFR calc non Af Amer: 82 mL/min/{1.73_m2} (ref >59)
Globulin, Total: 2.6 g/dL (ref 1.5–4.5)
Glucose: 130 mg/dL — ABNORMAL HIGH (ref 65–99)
Potassium: 3.6 mmol/L (ref 3.5–5.2)
Sodium: 139 mmol/L (ref 134–144)
Total Protein: 7.5 g/dL (ref 6.0–8.5)

## 2018-06-09 LAB — LIPID PANEL
Chol/HDL Ratio: 3.2 ratio (ref 0.0–4.4)
Cholesterol, Total: 186 mg/dL (ref 100–199)
HDL: 58 mg/dL (ref >39)
LDL Calculated: 111 mg/dL — ABNORMAL HIGH (ref 0–99)
Triglycerides: 83 mg/dL (ref 0–149)
VLDL Cholesterol Cal: 17 mg/dL (ref 5–40)

## 2018-06-09 LAB — TSH: TSH: 2.27 u[IU]/mL (ref 0.450–4.500)

## 2018-06-09 LAB — HEMOGLOBIN A1C
Est. average glucose Bld gHb Est-mCnc: 154 mg/dL
Hgb A1c MFr Bld: 7 % — ABNORMAL HIGH (ref 4.8–5.6)

## 2018-06-13 DIAGNOSIS — R109 Unspecified abdominal pain: Secondary | ICD-10-CM | POA: Diagnosis not present

## 2018-06-13 DIAGNOSIS — E1169 Type 2 diabetes mellitus with other specified complication: Secondary | ICD-10-CM | POA: Diagnosis not present

## 2018-06-13 DIAGNOSIS — M8580 Other specified disorders of bone density and structure, unspecified site: Secondary | ICD-10-CM | POA: Diagnosis not present

## 2018-06-13 DIAGNOSIS — E7800 Pure hypercholesterolemia, unspecified: Secondary | ICD-10-CM | POA: Diagnosis not present

## 2018-06-13 DIAGNOSIS — R0989 Other specified symptoms and signs involving the circulatory and respiratory systems: Secondary | ICD-10-CM | POA: Diagnosis not present

## 2018-06-13 DIAGNOSIS — I10 Essential (primary) hypertension: Secondary | ICD-10-CM | POA: Diagnosis not present

## 2018-06-13 DIAGNOSIS — Z0000 Encounter for general adult medical examination without abnormal findings: Secondary | ICD-10-CM | POA: Diagnosis not present

## 2018-06-13 DIAGNOSIS — R110 Nausea: Secondary | ICD-10-CM | POA: Diagnosis not present

## 2018-06-13 DIAGNOSIS — Z23 Encounter for immunization: Secondary | ICD-10-CM | POA: Diagnosis not present

## 2018-06-16 ENCOUNTER — Other Ambulatory Visit: Payer: Self-pay | Admitting: Family Medicine

## 2018-06-16 DIAGNOSIS — R0989 Other specified symptoms and signs involving the circulatory and respiratory systems: Secondary | ICD-10-CM

## 2018-06-20 ENCOUNTER — Ambulatory Visit
Admission: RE | Admit: 2018-06-20 | Discharge: 2018-06-20 | Disposition: A | Discharge status: Home / Self Care | Payer: PPO | Source: Ambulatory Visit | Attending: Family Medicine | Admitting: Family Medicine

## 2018-06-20 DIAGNOSIS — I6523 Occlusion and stenosis of bilateral carotid arteries: Secondary | ICD-10-CM | POA: Diagnosis not present

## 2018-06-20 DIAGNOSIS — R0989 Other specified symptoms and signs involving the circulatory and respiratory systems: Secondary | ICD-10-CM

## 2018-07-03 DIAGNOSIS — Z6832 Body mass index (BMI) 32.0-32.9, adult: Secondary | ICD-10-CM | POA: Diagnosis not present

## 2018-07-03 DIAGNOSIS — M47812 Spondylosis without myelopathy or radiculopathy, cervical region: Secondary | ICD-10-CM | POA: Diagnosis not present

## 2018-07-03 DIAGNOSIS — M47816 Spondylosis without myelopathy or radiculopathy, lumbar region: Secondary | ICD-10-CM | POA: Diagnosis not present

## 2018-07-06 ENCOUNTER — Encounter: Payer: Self-pay | Admitting: Skilled Nursing Facility1

## 2018-07-06 ENCOUNTER — Encounter: Payer: PPO | Attending: Family Medicine | Admitting: Skilled Nursing Facility1

## 2018-07-06 DIAGNOSIS — E119 Type 2 diabetes mellitus without complications: Secondary | ICD-10-CM | POA: Insufficient documentation

## 2018-07-06 DIAGNOSIS — Z713 Dietary counseling and surveillance: Secondary | ICD-10-CM | POA: Diagnosis not present

## 2018-07-06 DIAGNOSIS — E669 Obesity, unspecified: Secondary | ICD-10-CM

## 2018-07-06 NOTE — Patient Instructions (Addendum)
-  Only eat half the protein bar for a snack  -Pour off the juice from your fruit cup  -Aim to get in 16 more ounces of water  -Add a handful of spinach to your smoothie  -Do not weight yourself more than once a week  -Aim to eat every 3-5 hours which is 3 meals and 2-3 snacks; try not to snack in between that   -When eating out cut your protein and carbohydrate in half and eat all of your vegetables (ask them not to cook your vegetables in butter)  -When eating protein that has a breading do not add a carbohydrate to it

## 2018-07-06 NOTE — Progress Notes (Signed)
  Assessment:  Primary concerns today: weight management.   Pt states she needs to drop 20 pounds. Pt states her current weight is her usual weight. Pt states for the last 2 weeks she has been actively working on losing weight. Pt states she sleeps well with averaging 8 hours a night. Pt states she has a bowel movement about every couple days with miralax. Pt states uses a glycerin suppository. Pt states she has had chronic constipation for many years. Pt states she is not as hungry as she used to be. Pt states she sometimes needs 3 soda crackers to settle her stomach in the middle of the night.   MEDICATIONS: See List   DIETARY INTAKE:  Usual eating pattern includes 3 meals and 1 snacks per day.  Everyday foods include none stated.  Avoided foods include none stated.    24-hr recall: 2-3 times a week eating out B ( AM): cereal or eggs with 3 slices [place] bacon or fruit or protein bar Snk ( AM): protein bar  L ( PM): sardines with crackers  Snk ( PM): protein bar D ( PM): salad with sardines and eggs  Snk ( PM): Beverages: 50.7 ounces water, sugar free peach tea, diet dr pepper or diet gingerale   Usual physical activity: stair stepper and stationary bike 15 minutes and weights 3 times a week for about 15 minutes: total 30 minutes 3 days a week  Estimated energy needs: 1500 calories 170 g carbohydrates 112 g protein 42 g fat  Progress Towards Goal(s):  In progress.    Intervention:  Nutrition counseling for weight management. Pt was educated on the fact that due to her age a 1 pound weight loss is difficult and not necessarily advised so her goals are to consume nutritious foods and be as active as tolerated with her knee pain.  Goals: -Only eat half the protein bar for a snack -Pour off the juice from your fruit cup -Aim to get in 16 more ounces of water -Add a handful of spinach to your smoothie -Do not weight yourself more than once a week -Aim to eat every 3-5 hours which  is 3 meals and 2-3 snacks; try not to snack in between that  -When eating out cut your protein and carbohydrate in half and eat all of your vegetables (ask them not to cook your vegetables in butter) -When eating protein that has a breading do not add a carbohydrate to it Teaching Method Utilized:  Visual Auditory Hands on  Handouts given during visit include:  Meal ideas  Barriers to learning/adherence to lifestyle change: none identified  Demonstrated degree of understanding via:  Teach Back   Monitoring/Evaluation:  Dietary intake, exercise, and body weight prn.

## 2018-07-27 DIAGNOSIS — K5902 Outlet dysfunction constipation: Secondary | ICD-10-CM | POA: Diagnosis not present

## 2018-07-27 DIAGNOSIS — R1013 Epigastric pain: Secondary | ICD-10-CM | POA: Diagnosis not present

## 2018-07-28 DIAGNOSIS — L821 Other seborrheic keratosis: Secondary | ICD-10-CM | POA: Diagnosis not present

## 2018-07-28 DIAGNOSIS — D1801 Hemangioma of skin and subcutaneous tissue: Secondary | ICD-10-CM | POA: Diagnosis not present

## 2018-07-28 DIAGNOSIS — D225 Melanocytic nevi of trunk: Secondary | ICD-10-CM | POA: Diagnosis not present

## 2018-07-28 DIAGNOSIS — D692 Other nonthrombocytopenic purpura: Secondary | ICD-10-CM | POA: Diagnosis not present

## 2018-07-28 DIAGNOSIS — L814 Other melanin hyperpigmentation: Secondary | ICD-10-CM | POA: Diagnosis not present

## 2018-07-28 DIAGNOSIS — D2272 Melanocytic nevi of left lower limb, including hip: Secondary | ICD-10-CM | POA: Diagnosis not present

## 2018-08-01 DIAGNOSIS — M47816 Spondylosis without myelopathy or radiculopathy, lumbar region: Secondary | ICD-10-CM | POA: Diagnosis not present

## 2018-08-30 DIAGNOSIS — K5902 Outlet dysfunction constipation: Secondary | ICD-10-CM | POA: Diagnosis not present

## 2018-08-30 DIAGNOSIS — Z86010 Personal history of colonic polyps: Secondary | ICD-10-CM | POA: Diagnosis not present

## 2018-09-11 DIAGNOSIS — E1169 Type 2 diabetes mellitus with other specified complication: Secondary | ICD-10-CM | POA: Diagnosis not present

## 2018-09-25 DIAGNOSIS — Z6830 Body mass index (BMI) 30.0-30.9, adult: Secondary | ICD-10-CM | POA: Diagnosis not present

## 2018-09-25 DIAGNOSIS — M47812 Spondylosis without myelopathy or radiculopathy, cervical region: Secondary | ICD-10-CM | POA: Diagnosis not present

## 2018-09-25 DIAGNOSIS — M47816 Spondylosis without myelopathy or radiculopathy, lumbar region: Secondary | ICD-10-CM | POA: Diagnosis not present

## 2018-09-25 DIAGNOSIS — I10 Essential (primary) hypertension: Secondary | ICD-10-CM | POA: Diagnosis not present

## 2018-10-16 DIAGNOSIS — M47816 Spondylosis without myelopathy or radiculopathy, lumbar region: Secondary | ICD-10-CM | POA: Diagnosis not present

## 2018-10-16 DIAGNOSIS — M1611 Unilateral primary osteoarthritis, right hip: Secondary | ICD-10-CM | POA: Diagnosis not present

## 2018-10-24 DIAGNOSIS — R109 Unspecified abdominal pain: Secondary | ICD-10-CM | POA: Diagnosis not present

## 2018-10-24 DIAGNOSIS — K5902 Outlet dysfunction constipation: Secondary | ICD-10-CM | POA: Diagnosis not present

## 2018-11-01 DIAGNOSIS — R109 Unspecified abdominal pain: Secondary | ICD-10-CM | POA: Diagnosis not present

## 2018-11-01 DIAGNOSIS — M62838 Other muscle spasm: Secondary | ICD-10-CM | POA: Diagnosis not present

## 2018-11-01 DIAGNOSIS — M6281 Muscle weakness (generalized): Secondary | ICD-10-CM | POA: Diagnosis not present

## 2018-11-01 DIAGNOSIS — K5902 Outlet dysfunction constipation: Secondary | ICD-10-CM | POA: Diagnosis not present

## 2018-11-02 DIAGNOSIS — M1611 Unilateral primary osteoarthritis, right hip: Secondary | ICD-10-CM | POA: Diagnosis not present

## 2018-11-06 DIAGNOSIS — R109 Unspecified abdominal pain: Secondary | ICD-10-CM | POA: Diagnosis not present

## 2018-11-06 DIAGNOSIS — M62838 Other muscle spasm: Secondary | ICD-10-CM | POA: Diagnosis not present

## 2018-11-06 DIAGNOSIS — M6281 Muscle weakness (generalized): Secondary | ICD-10-CM | POA: Diagnosis not present

## 2018-11-06 DIAGNOSIS — K5902 Outlet dysfunction constipation: Secondary | ICD-10-CM | POA: Diagnosis not present

## 2018-11-08 DIAGNOSIS — M62838 Other muscle spasm: Secondary | ICD-10-CM | POA: Diagnosis not present

## 2018-11-08 DIAGNOSIS — M6281 Muscle weakness (generalized): Secondary | ICD-10-CM | POA: Diagnosis not present

## 2018-11-08 DIAGNOSIS — R109 Unspecified abdominal pain: Secondary | ICD-10-CM | POA: Diagnosis not present

## 2018-11-08 DIAGNOSIS — K5902 Outlet dysfunction constipation: Secondary | ICD-10-CM | POA: Diagnosis not present

## 2018-11-09 ENCOUNTER — Other Ambulatory Visit: Payer: Self-pay | Admitting: Cardiology

## 2018-11-09 DIAGNOSIS — I2510 Atherosclerotic heart disease of native coronary artery without angina pectoris: Secondary | ICD-10-CM

## 2018-11-09 DIAGNOSIS — I10 Essential (primary) hypertension: Secondary | ICD-10-CM

## 2018-11-09 DIAGNOSIS — E7800 Pure hypercholesterolemia, unspecified: Secondary | ICD-10-CM

## 2018-11-13 DIAGNOSIS — K5902 Outlet dysfunction constipation: Secondary | ICD-10-CM | POA: Diagnosis not present

## 2018-11-13 DIAGNOSIS — R109 Unspecified abdominal pain: Secondary | ICD-10-CM | POA: Diagnosis not present

## 2018-11-13 DIAGNOSIS — M62838 Other muscle spasm: Secondary | ICD-10-CM | POA: Diagnosis not present

## 2018-11-13 DIAGNOSIS — M6281 Muscle weakness (generalized): Secondary | ICD-10-CM | POA: Diagnosis not present

## 2018-11-16 DIAGNOSIS — K5902 Outlet dysfunction constipation: Secondary | ICD-10-CM | POA: Diagnosis not present

## 2018-11-16 DIAGNOSIS — R109 Unspecified abdominal pain: Secondary | ICD-10-CM | POA: Diagnosis not present

## 2018-11-16 DIAGNOSIS — M6281 Muscle weakness (generalized): Secondary | ICD-10-CM | POA: Diagnosis not present

## 2018-11-16 DIAGNOSIS — M62838 Other muscle spasm: Secondary | ICD-10-CM | POA: Diagnosis not present

## 2018-11-20 DIAGNOSIS — M6281 Muscle weakness (generalized): Secondary | ICD-10-CM | POA: Diagnosis not present

## 2018-11-20 DIAGNOSIS — M62838 Other muscle spasm: Secondary | ICD-10-CM | POA: Diagnosis not present

## 2018-11-20 DIAGNOSIS — R109 Unspecified abdominal pain: Secondary | ICD-10-CM | POA: Diagnosis not present

## 2018-11-20 DIAGNOSIS — K5902 Outlet dysfunction constipation: Secondary | ICD-10-CM | POA: Diagnosis not present

## 2018-11-23 DIAGNOSIS — M6281 Muscle weakness (generalized): Secondary | ICD-10-CM | POA: Diagnosis not present

## 2018-11-23 DIAGNOSIS — R109 Unspecified abdominal pain: Secondary | ICD-10-CM | POA: Diagnosis not present

## 2018-11-23 DIAGNOSIS — M62838 Other muscle spasm: Secondary | ICD-10-CM | POA: Diagnosis not present

## 2018-11-23 DIAGNOSIS — K5902 Outlet dysfunction constipation: Secondary | ICD-10-CM | POA: Diagnosis not present

## 2018-11-27 DIAGNOSIS — M62838 Other muscle spasm: Secondary | ICD-10-CM | POA: Diagnosis not present

## 2018-11-27 DIAGNOSIS — M6281 Muscle weakness (generalized): Secondary | ICD-10-CM | POA: Diagnosis not present

## 2018-11-27 DIAGNOSIS — K5902 Outlet dysfunction constipation: Secondary | ICD-10-CM | POA: Diagnosis not present

## 2018-11-27 DIAGNOSIS — R109 Unspecified abdominal pain: Secondary | ICD-10-CM | POA: Diagnosis not present

## 2018-11-30 DIAGNOSIS — M6281 Muscle weakness (generalized): Secondary | ICD-10-CM | POA: Diagnosis not present

## 2018-11-30 DIAGNOSIS — R109 Unspecified abdominal pain: Secondary | ICD-10-CM | POA: Diagnosis not present

## 2018-11-30 DIAGNOSIS — K5902 Outlet dysfunction constipation: Secondary | ICD-10-CM | POA: Diagnosis not present

## 2018-11-30 DIAGNOSIS — M62838 Other muscle spasm: Secondary | ICD-10-CM | POA: Diagnosis not present

## 2018-12-04 DIAGNOSIS — R109 Unspecified abdominal pain: Secondary | ICD-10-CM | POA: Diagnosis not present

## 2018-12-04 DIAGNOSIS — K5902 Outlet dysfunction constipation: Secondary | ICD-10-CM | POA: Diagnosis not present

## 2018-12-04 DIAGNOSIS — M62838 Other muscle spasm: Secondary | ICD-10-CM | POA: Diagnosis not present

## 2018-12-04 DIAGNOSIS — M6281 Muscle weakness (generalized): Secondary | ICD-10-CM | POA: Diagnosis not present

## 2018-12-07 DIAGNOSIS — K5902 Outlet dysfunction constipation: Secondary | ICD-10-CM | POA: Diagnosis not present

## 2018-12-07 DIAGNOSIS — M6281 Muscle weakness (generalized): Secondary | ICD-10-CM | POA: Diagnosis not present

## 2018-12-07 DIAGNOSIS — R109 Unspecified abdominal pain: Secondary | ICD-10-CM | POA: Diagnosis not present

## 2018-12-07 DIAGNOSIS — M62838 Other muscle spasm: Secondary | ICD-10-CM | POA: Diagnosis not present

## 2018-12-12 DIAGNOSIS — Z7984 Long term (current) use of oral hypoglycemic drugs: Secondary | ICD-10-CM | POA: Diagnosis not present

## 2018-12-12 DIAGNOSIS — E1169 Type 2 diabetes mellitus with other specified complication: Secondary | ICD-10-CM | POA: Diagnosis not present

## 2019-01-08 ENCOUNTER — Other Ambulatory Visit: Payer: Self-pay | Admitting: Family Medicine

## 2019-01-08 DIAGNOSIS — Z1231 Encounter for screening mammogram for malignant neoplasm of breast: Secondary | ICD-10-CM

## 2019-01-24 ENCOUNTER — Other Ambulatory Visit: Payer: Self-pay | Admitting: Family Medicine

## 2019-01-24 DIAGNOSIS — N644 Mastodynia: Secondary | ICD-10-CM

## 2019-02-01 DIAGNOSIS — M545 Low back pain: Secondary | ICD-10-CM | POA: Diagnosis not present

## 2019-02-05 ENCOUNTER — Other Ambulatory Visit: Payer: Self-pay | Admitting: Family Medicine

## 2019-02-05 ENCOUNTER — Ambulatory Visit
Admission: RE | Admit: 2019-02-05 | Discharge: 2019-02-05 | Disposition: A | Discharge status: Home / Self Care | Payer: PPO | Source: Ambulatory Visit | Attending: Family Medicine | Admitting: Family Medicine

## 2019-02-05 ENCOUNTER — Other Ambulatory Visit: Payer: Self-pay

## 2019-02-05 ENCOUNTER — Other Ambulatory Visit: Payer: PPO

## 2019-02-05 ENCOUNTER — Ambulatory Visit: Payer: PPO

## 2019-02-05 DIAGNOSIS — N6310 Unspecified lump in the right breast, unspecified quadrant: Secondary | ICD-10-CM

## 2019-02-05 DIAGNOSIS — N644 Mastodynia: Secondary | ICD-10-CM

## 2019-02-08 ENCOUNTER — Ambulatory Visit
Admission: RE | Admit: 2019-02-08 | Discharge: 2019-02-08 | Disposition: A | Discharge status: Home / Self Care | Payer: PPO | Source: Ambulatory Visit | Attending: Family Medicine | Admitting: Family Medicine

## 2019-02-08 ENCOUNTER — Other Ambulatory Visit: Payer: Self-pay

## 2019-02-08 DIAGNOSIS — N6310 Unspecified lump in the right breast, unspecified quadrant: Secondary | ICD-10-CM

## 2019-02-08 DIAGNOSIS — N6311 Unspecified lump in the right breast, upper outer quadrant: Secondary | ICD-10-CM | POA: Diagnosis not present

## 2019-02-20 ENCOUNTER — Ambulatory Visit: Payer: Self-pay | Admitting: General Surgery

## 2019-02-20 DIAGNOSIS — N6021 Fibroadenosis of right breast: Secondary | ICD-10-CM | POA: Diagnosis not present

## 2019-03-06 ENCOUNTER — Other Ambulatory Visit: Payer: Self-pay | Admitting: General Surgery

## 2019-03-06 DIAGNOSIS — N6021 Fibroadenosis of right breast: Secondary | ICD-10-CM

## 2019-03-06 DIAGNOSIS — M48061 Spinal stenosis, lumbar region without neurogenic claudication: Secondary | ICD-10-CM | POA: Diagnosis not present

## 2019-03-06 DIAGNOSIS — M47816 Spondylosis without myelopathy or radiculopathy, lumbar region: Secondary | ICD-10-CM | POA: Diagnosis not present

## 2019-04-10 DIAGNOSIS — R1013 Epigastric pain: Secondary | ICD-10-CM | POA: Diagnosis not present

## 2019-04-10 DIAGNOSIS — K5902 Outlet dysfunction constipation: Secondary | ICD-10-CM | POA: Diagnosis not present

## 2019-04-10 DIAGNOSIS — N9069 Other specified hypertrophy of vulva: Secondary | ICD-10-CM | POA: Diagnosis not present

## 2019-04-10 DIAGNOSIS — R1010 Upper abdominal pain, unspecified: Secondary | ICD-10-CM | POA: Diagnosis not present

## 2019-04-10 DIAGNOSIS — R143 Flatulence: Secondary | ICD-10-CM | POA: Diagnosis not present

## 2019-04-10 DIAGNOSIS — Z86010 Personal history of colonic polyps: Secondary | ICD-10-CM | POA: Diagnosis not present

## 2019-04-10 DIAGNOSIS — N898 Other specified noninflammatory disorders of vagina: Secondary | ICD-10-CM | POA: Diagnosis not present

## 2019-04-16 ENCOUNTER — Encounter (HOSPITAL_BASED_OUTPATIENT_CLINIC_OR_DEPARTMENT_OTHER): Payer: Self-pay | Admitting: *Deleted

## 2019-04-16 ENCOUNTER — Other Ambulatory Visit: Payer: Self-pay

## 2019-04-24 ENCOUNTER — Other Ambulatory Visit (HOSPITAL_COMMUNITY)
Admission: RE | Admit: 2019-04-24 | Discharge: 2019-04-24 | Disposition: A | Discharge status: Home / Self Care | Payer: PPO | Source: Ambulatory Visit | Attending: General Surgery | Admitting: General Surgery

## 2019-04-24 ENCOUNTER — Encounter (HOSPITAL_BASED_OUTPATIENT_CLINIC_OR_DEPARTMENT_OTHER)
Admission: RE | Admit: 2019-04-24 | Discharge: 2019-04-24 | Disposition: A | Discharge status: Home / Self Care | Payer: PPO | Source: Ambulatory Visit | Attending: General Surgery | Admitting: General Surgery

## 2019-04-24 ENCOUNTER — Other Ambulatory Visit: Payer: Self-pay

## 2019-04-24 DIAGNOSIS — Z1159 Encounter for screening for other viral diseases: Secondary | ICD-10-CM | POA: Insufficient documentation

## 2019-04-24 LAB — BASIC METABOLIC PANEL
Anion gap: 10 (ref 5–15)
BUN: 20 mg/dL (ref 8–23)
CO2: 27 mmol/L (ref 22–32)
Calcium: 9.8 mg/dL (ref 8.9–10.3)
Chloride: 103 mmol/L (ref 98–111)
Creatinine, Ser: 0.66 mg/dL (ref 0.44–1.00)
GFR calc Af Amer: >60 mL/min (ref >60)
GFR calc non Af Amer: >60 mL/min (ref >60)
Glucose, Bld: 113 mg/dL — ABNORMAL HIGH (ref 70–99)
Potassium: 4.1 mmol/L (ref 3.5–5.1)
Sodium: 140 mmol/L (ref 135–145)

## 2019-04-24 LAB — SARS CORONAVIRUS 2 (TAT 6-24 HRS): SARS Coronavirus 2: NEGATIVE

## 2019-04-24 NOTE — Progress Notes (Signed)
Gatorade 2 drink and surgical soap given with instructions, pt verbalized understanding. 

## 2019-04-26 ENCOUNTER — Ambulatory Visit
Admission: RE | Admit: 2019-04-26 | Discharge: 2019-04-26 | Disposition: A | Discharge status: Home / Self Care | Payer: PPO | Source: Ambulatory Visit | Attending: General Surgery | Admitting: General Surgery

## 2019-04-26 ENCOUNTER — Other Ambulatory Visit: Payer: Self-pay

## 2019-04-26 DIAGNOSIS — R928 Other abnormal and inconclusive findings on diagnostic imaging of breast: Secondary | ICD-10-CM | POA: Diagnosis not present

## 2019-04-26 DIAGNOSIS — N6021 Fibroadenosis of right breast: Secondary | ICD-10-CM

## 2019-04-27 ENCOUNTER — Ambulatory Visit (HOSPITAL_BASED_OUTPATIENT_CLINIC_OR_DEPARTMENT_OTHER): Payer: PPO | Admitting: Certified Registered"

## 2019-04-27 ENCOUNTER — Other Ambulatory Visit: Payer: Self-pay

## 2019-04-27 ENCOUNTER — Ambulatory Visit
Admission: RE | Admit: 2019-04-27 | Discharge: 2019-04-27 | Disposition: A | Discharge status: Home / Self Care | Payer: PPO | Source: Ambulatory Visit | Attending: General Surgery | Admitting: General Surgery

## 2019-04-27 ENCOUNTER — Ambulatory Visit (HOSPITAL_BASED_OUTPATIENT_CLINIC_OR_DEPARTMENT_OTHER)
Admission: RE | Admit: 2019-04-27 | Discharge: 2019-04-27 | Disposition: A | Discharge status: Home / Self Care | Payer: PPO | Attending: General Surgery | Admitting: General Surgery

## 2019-04-27 ENCOUNTER — Encounter (HOSPITAL_BASED_OUTPATIENT_CLINIC_OR_DEPARTMENT_OTHER): Payer: Self-pay | Admitting: Certified Registered"

## 2019-04-27 ENCOUNTER — Encounter (HOSPITAL_BASED_OUTPATIENT_CLINIC_OR_DEPARTMENT_OTHER)
Admission: RE | Disposition: A | Discharge status: Home / Self Care | Payer: Self-pay | Source: Home / Self Care | Attending: General Surgery

## 2019-04-27 DIAGNOSIS — R921 Mammographic calcification found on diagnostic imaging of breast: Secondary | ICD-10-CM | POA: Diagnosis not present

## 2019-04-27 DIAGNOSIS — N6021 Fibroadenosis of right breast: Secondary | ICD-10-CM | POA: Insufficient documentation

## 2019-04-27 DIAGNOSIS — N62 Hypertrophy of breast: Secondary | ICD-10-CM | POA: Diagnosis not present

## 2019-04-27 DIAGNOSIS — I10 Essential (primary) hypertension: Secondary | ICD-10-CM | POA: Insufficient documentation

## 2019-04-27 DIAGNOSIS — N6489 Other specified disorders of breast: Secondary | ICD-10-CM | POA: Diagnosis not present

## 2019-04-27 DIAGNOSIS — E119 Type 2 diabetes mellitus without complications: Secondary | ICD-10-CM | POA: Insufficient documentation

## 2019-04-27 DIAGNOSIS — N6081 Other benign mammary dysplasias of right breast: Secondary | ICD-10-CM | POA: Diagnosis not present

## 2019-04-27 DIAGNOSIS — Z87891 Personal history of nicotine dependence: Secondary | ICD-10-CM | POA: Insufficient documentation

## 2019-04-27 DIAGNOSIS — Z7984 Long term (current) use of oral hypoglycemic drugs: Secondary | ICD-10-CM | POA: Diagnosis not present

## 2019-04-27 DIAGNOSIS — Z79899 Other long term (current) drug therapy: Secondary | ICD-10-CM | POA: Insufficient documentation

## 2019-04-27 DIAGNOSIS — R928 Other abnormal and inconclusive findings on diagnostic imaging of breast: Secondary | ICD-10-CM | POA: Diagnosis not present

## 2019-04-27 DIAGNOSIS — Z7982 Long term (current) use of aspirin: Secondary | ICD-10-CM | POA: Insufficient documentation

## 2019-04-27 HISTORY — DX: Type 2 diabetes mellitus without complications: E11.9

## 2019-04-27 HISTORY — PX: BREAST LUMPECTOMY WITH RADIOACTIVE SEED LOCALIZATION: SHX6424

## 2019-04-27 LAB — GLUCOSE, CAPILLARY: Glucose-Capillary: 112 mg/dL — ABNORMAL HIGH (ref 70–99)

## 2019-04-27 SURGERY — BREAST LUMPECTOMY WITH RADIOACTIVE SEED LOCALIZATION
Anesthesia: General | Site: Breast | Laterality: Right

## 2019-04-27 MED ORDER — BUPIVACAINE-EPINEPHRINE (PF) 0.25% -1:200000 IJ SOLN
INTRAMUSCULAR | Status: AC
  Filled 2019-04-27: qty 30

## 2019-04-27 MED ORDER — PROPOFOL 10 MG/ML IV BOLUS
INTRAVENOUS | Status: DC | PRN
  Administered 2019-04-27: 150 mg via INTRAVENOUS

## 2019-04-27 MED ORDER — ACETAMINOPHEN 10 MG/ML IV SOLN: 1000.0000 mg | Freq: Once | INTRAVENOUS | Status: DC | PRN

## 2019-04-27 MED ORDER — CELECOXIB 200 MG PO CAPS
200.0000 mg | ORAL_CAPSULE | ORAL | Status: AC
  Administered 2019-04-27: 200 mg via ORAL

## 2019-04-27 MED ORDER — CHLORHEXIDINE GLUCONATE CLOTH 2 % EX PADS: 6.0000 | MEDICATED_PAD | Freq: Once | CUTANEOUS | Status: DC

## 2019-04-27 MED ORDER — CEFAZOLIN SODIUM-DEXTROSE 2-4 GM/100ML-% IV SOLN
2.0000 g | INTRAVENOUS | Status: AC
  Administered 2019-04-27: 2 g via INTRAVENOUS

## 2019-04-27 MED ORDER — ONDANSETRON HCL 4 MG/2ML IJ SOLN
INTRAMUSCULAR | Status: DC | PRN
  Administered 2019-04-27: 4 mg via INTRAVENOUS

## 2019-04-27 MED ORDER — OXYCODONE HCL 5 MG/5ML PO SOLN: 5.0000 mg | Freq: Once | ORAL | Status: DC | PRN

## 2019-04-27 MED ORDER — ACETAMINOPHEN 500 MG PO TABS
ORAL_TABLET | ORAL | Status: AC
  Filled 2019-04-27: qty 2

## 2019-04-27 MED ORDER — ACETAMINOPHEN 500 MG PO TABS: 1000.0000 mg | ORAL_TABLET | Freq: Once | ORAL | Status: DC | PRN

## 2019-04-27 MED ORDER — ACETAMINOPHEN 500 MG PO TABS
1000.0000 mg | ORAL_TABLET | ORAL | Status: AC
  Administered 2019-04-27: 1000 mg via ORAL

## 2019-04-27 MED ORDER — ACETAMINOPHEN 160 MG/5ML PO SOLN: 1000.0000 mg | Freq: Once | ORAL | Status: DC | PRN

## 2019-04-27 MED ORDER — GABAPENTIN 300 MG PO CAPS
300.0000 mg | ORAL_CAPSULE | ORAL | Status: AC
  Administered 2019-04-27: 300 mg via ORAL

## 2019-04-27 MED ORDER — CELECOXIB 200 MG PO CAPS
ORAL_CAPSULE | ORAL | Status: AC
  Filled 2019-04-27: qty 1

## 2019-04-27 MED ORDER — OXYCODONE HCL 5 MG PO TABS: 5.0000 mg | ORAL_TABLET | Freq: Once | ORAL | Status: DC | PRN

## 2019-04-27 MED ORDER — FENTANYL CITRATE (PF) 100 MCG/2ML IJ SOLN
INTRAMUSCULAR | Status: AC
  Filled 2019-04-27: qty 2

## 2019-04-27 MED ORDER — BUPIVACAINE-EPINEPHRINE (PF) 0.25% -1:200000 IJ SOLN
INTRAMUSCULAR | Status: DC | PRN
  Administered 2019-04-27 (×2): 10 mL

## 2019-04-27 MED ORDER — HYDROCODONE-ACETAMINOPHEN 5-325 MG PO TABS: 1.0000 | ORAL_TABLET | Freq: Four times a day (QID) | ORAL | 0 refills | Status: DC | PRN

## 2019-04-27 MED ORDER — CEFAZOLIN SODIUM-DEXTROSE 2-4 GM/100ML-% IV SOLN
INTRAVENOUS | Status: AC
  Filled 2019-04-27: qty 100

## 2019-04-27 MED ORDER — LACTATED RINGERS IV SOLN
INTRAVENOUS | Status: DC
  Administered 2019-04-27: 12:00:00 via INTRAVENOUS

## 2019-04-27 MED ORDER — SCOPOLAMINE 1 MG/3DAYS TD PT72: 1.0000 | MEDICATED_PATCH | Freq: Once | TRANSDERMAL | Status: DC

## 2019-04-27 MED ORDER — FENTANYL CITRATE (PF) 100 MCG/2ML IJ SOLN
50.0000 ug | INTRAMUSCULAR | Status: DC | PRN
  Administered 2019-04-27: 50 ug via INTRAVENOUS

## 2019-04-27 MED ORDER — MIDAZOLAM HCL 2 MG/2ML IJ SOLN: 1.0000 mg | INTRAMUSCULAR | Status: DC | PRN

## 2019-04-27 MED ORDER — LIDOCAINE HCL (CARDIAC) PF 100 MG/5ML IV SOSY
PREFILLED_SYRINGE | INTRAVENOUS | Status: DC | PRN
  Administered 2019-04-27: 60 mg via INTRAVENOUS

## 2019-04-27 MED ORDER — GABAPENTIN 300 MG PO CAPS
ORAL_CAPSULE | ORAL | Status: AC
  Filled 2019-04-27: qty 1

## 2019-04-27 MED ORDER — FENTANYL CITRATE (PF) 100 MCG/2ML IJ SOLN: 25.0000 ug | INTRAMUSCULAR | Status: DC | PRN

## 2019-04-27 MED ORDER — PROPOFOL 500 MG/50ML IV EMUL
INTRAVENOUS | Status: DC | PRN
  Administered 2019-04-27: 25 ug/kg/min via INTRAVENOUS

## 2019-04-27 MED ORDER — DEXAMETHASONE SODIUM PHOSPHATE 4 MG/ML IJ SOLN
INTRAMUSCULAR | Status: DC | PRN
  Administered 2019-04-27: 4 mg via INTRAVENOUS

## 2019-04-27 MED ORDER — BUPIVACAINE HCL (PF) 0.25 % IJ SOLN
INTRAMUSCULAR | Status: AC
  Filled 2019-04-27: qty 30

## 2019-04-27 SURGICAL SUPPLY — 42 items
APPLIER CLIP 9.375 MED OPEN (MISCELLANEOUS)
BLADE SURG 15 STRL LF DISP TIS (BLADE) ×1 IMPLANT
BLADE SURG 15 STRL SS (BLADE) ×1
CANISTER SUC SOCK COL 7IN (MISCELLANEOUS) ×1 IMPLANT
CANISTER SUCT 1200ML W/VALVE (MISCELLANEOUS) ×2 IMPLANT
CHLORAPREP W/TINT 26 (MISCELLANEOUS) ×2 IMPLANT
CLIP APPLIE 9.375 MED OPEN (MISCELLANEOUS) IMPLANT
COVER BACK TABLE REUSABLE LG (DRAPES) ×2 IMPLANT
COVER MAYO STAND REUSABLE (DRAPES) ×2 IMPLANT
COVER PROBE W GEL 5X96 (DRAPES) ×2 IMPLANT
COVER WAND RF STERILE (DRAPES) ×1 IMPLANT
DECANTER SPIKE VIAL GLASS SM (MISCELLANEOUS) IMPLANT
DERMABOND ADVANCED (GAUZE/BANDAGES/DRESSINGS) ×1
DERMABOND ADVANCED .7 DNX12 (GAUZE/BANDAGES/DRESSINGS) ×1 IMPLANT
DRAPE LAPAROSCOPIC ABDOMINAL (DRAPES) ×2 IMPLANT
DRAPE UTILITY XL STRL (DRAPES) ×2 IMPLANT
ELECT COATED BLADE 2.86 ST (ELECTRODE) ×2 IMPLANT
ELECT REM PT RETURN 9FT ADLT (ELECTROSURGICAL) ×2
ELECTRODE REM PT RTRN 9FT ADLT (ELECTROSURGICAL) ×1 IMPLANT
GLOVE BIO SURGEON STRL SZ 6.5 (GLOVE) ×1 IMPLANT
GLOVE BIO SURGEON STRL SZ7.5 (GLOVE) ×4 IMPLANT
GLOVE ECLIPSE 6.0 STRL STRAW (GLOVE) ×1 IMPLANT
GOWN STRL REUS W/ TWL LRG LVL3 (GOWN DISPOSABLE) ×2 IMPLANT
GOWN STRL REUS W/TWL LRG LVL3 (GOWN DISPOSABLE) ×2
ILLUMINATOR WAVEGUIDE N/F (MISCELLANEOUS) IMPLANT
KIT MARKER MARGIN INK (KITS) ×2 IMPLANT
LIGHT WAVEGUIDE WIDE FLAT (MISCELLANEOUS) IMPLANT
NDL HYPO 25X1 1.5 SAFETY (NEEDLE) IMPLANT
NEEDLE HYPO 25X1 1.5 SAFETY (NEEDLE) ×2 IMPLANT
NS IRRIG 1000ML POUR BTL (IV SOLUTION) ×1 IMPLANT
PACK BASIN DAY SURGERY FS (CUSTOM PROCEDURE TRAY) ×2 IMPLANT
PENCIL BUTTON HOLSTER BLD 10FT (ELECTRODE) ×2 IMPLANT
SLEEVE SCD COMPRESS KNEE MED (MISCELLANEOUS) ×2 IMPLANT
SPONGE LAP 18X18 RF (DISPOSABLE) ×2 IMPLANT
SUT MON AB 4-0 PC3 18 (SUTURE) ×1 IMPLANT
SUT SILK 2 0 SH (SUTURE) IMPLANT
SUT VICRYL 3-0 CR8 SH (SUTURE) ×2 IMPLANT
SYR CONTROL 10ML LL (SYRINGE) IMPLANT
TOWEL GREEN STERILE FF (TOWEL DISPOSABLE) ×2 IMPLANT
TRAY FAXITRON CT DISP (TRAY / TRAY PROCEDURE) ×2 IMPLANT
TUBE CONNECTING 20X1/4 (TUBING) ×2 IMPLANT
YANKAUER SUCT BULB TIP NO VENT (SUCTIONS) ×1 IMPLANT

## 2019-04-27 NOTE — Anesthesia Procedure Notes (Signed)
Procedure Name: LMA Insertion Date/Time: 04/27/2019 1:24 PM Performed by: Signe Colt, CRNA Pre-anesthesia Checklist: Patient identified, Emergency Drugs available, Suction available and Patient being monitored Patient Re-evaluated:Patient Re-evaluated prior to induction Oxygen Delivery Method: Circle system utilized Preoxygenation: Pre-oxygenation with 100% oxygen Induction Type: IV induction Ventilation: Mask ventilation without difficulty LMA: LMA inserted LMA Size: 4.0 Number of attempts: 1 Airway Equipment and Method: Bite block Placement Confirmation: positive ETCO2 Tube secured with: Tape Dental Injury: Teeth and Oropharynx as per pre-operative assessment

## 2019-04-27 NOTE — Interval H&P Note (Signed)
History and Physical Interval Note:  04/27/2019 12:04 PM  Andrea Farmer  has presented today for surgery, with the diagnosis of RIGHT BREAST COMPLEX SCLEROSING LESION.  The various methods of treatment have been discussed with the patient and family. After consideration of risks, benefits and other options for treatment, the patient has consented to  Procedure(s): RIGHT BREAST LUMPECTOMY WITH RADIOACTIVE SEED LOCALIZATION (Right) as a surgical intervention.  The patient's history has been reviewed, patient examined, no change in status, stable for surgery.  I have reviewed the patient's chart and labs.  Questions were answered to the patient's satisfaction.     [name]

## 2019-04-27 NOTE — Transfer of Care (Signed)
Immediate Anesthesia Transfer of Care Note  Patient: Andrea Farmer  Procedure(s) Performed: RIGHT BREAST LUMPECTOMY WITH RADIOACTIVE SEED LOCALIZATION (Right Breast)  Patient Location: PACU  Anesthesia Type:General  Level of Consciousness: sedated and patient cooperative  Airway & Oxygen Therapy: Patient Spontanous Breathing and Patient connected to nasal cannula oxygen  Post-op Assessment: Report given to RN and Post -op Vital signs reviewed and stable  Post vital signs: Reviewed and stable  Last Vitals:  Vitals Value Taken Time  BP 103/53 04/27/19 1404  Temp    Pulse 65 04/27/19 1405  Resp    SpO2 100 % 04/27/19 1405  Vitals shown include unvalidated device data.  Last Pain:  Vitals:   04/27/19 1119  TempSrc: Oral  PainSc: 0-No pain         Complications: No apparent anesthesia complications

## 2019-04-27 NOTE — Op Note (Signed)
04/27/2019  1:58 PM  PATIENT:  Andrea Farmer  78 y.o. female  PRE-OPERATIVE DIAGNOSIS:  RIGHT BREAST COMPLEX SCLEROSING LESION  POST-OPERATIVE DIAGNOSIS:  RIGHT BREAST COMPLEX SCLEROSING LESION  PROCEDURE:  Procedure(s): RIGHT BREAST LUMPECTOMY WITH RADIOACTIVE SEED LOCALIZATION (Right)  SURGEON:  Surgeon(s) and Role:    * Jovita Kussmaul, MD - Primary  PHYSICIAN ASSISTANT:   ASSISTANTS: none   ANESTHESIA:   local and general  EBL:  minimal   BLOOD ADMINISTERED:none  DRAINS: none   LOCAL MEDICATIONS USED:  MARCAINE     SPECIMEN:  Source of Specimen:  right breast tissue  DISPOSITION OF SPECIMEN:  PATHOLOGY  COUNTS:  YES  TOURNIQUET:  * No tourniquets in log *  DICTATION: .Dragon Dictation   After informed consent was obtained the patient was brought to the operating room and placed in the supine position on the operating table.  After adequate induction of general anesthesia the patient's right breast was prepped with ChloraPrep, allowed to dry, and draped in usual sterile manner.  An appropriate timeout was performed.  Previously an I-125 seed was placed in the upper outer aspect of the right breast to mark an area of a complex sclerosing lesion.  The neoprobe was set to I-125 in the area of radioactivity was readily identified.  The area around this was infiltrated with quarter percent Marcaine.  A curvilinear incision was then made vertically overlying the area of radioactivity with a 15 blade knife.  The incision was carried through the skin and subcutaneous tissue sharply with electrocautery.  Dissection was then carried towards the radioactive seed under the direction of the neoprobe.  Once I more closely approached the radioactive seed then I removed a circular portion of breast tissue sharply with the electrocautery around the radioactive seed while checking the area of radioactivity frequently.  Once the specimen was removed it was oriented with the appropriate paint  colors.  A specimen radiograph was obtained that showed the clip and seed to be near the center of the specimen.  The specimen was then sent to pathology for further evaluation.  Hemostasis was achieved using the Bovie electrocautery.  The wound was irrigated with saline and infiltrated with more quarter percent Marcaine.  The deep layer of the wound was closed with layers of interrupted 3-0 Vicryl stitches.  The skin was closed with a running 4-0 Monocryl subcuticular stitch.  Dermabond dressings were applied.  The patient tolerated the procedure well.  At the end of the case all needle sponge and instrument counts were correct.  The patient was then awakened and taken to recovery in stable condition.  PLAN OF CARE: Discharge to home after PACU  PATIENT DISPOSITION:  PACU - hemodynamically stable.   Delay start of Pharmacological VTE agent (>24hrs) due to surgical blood loss or risk of bleeding: not applicable

## 2019-04-27 NOTE — H&P (Signed)
Andrea Farmer  Location: [facility] Surgery Patient #: [id number] DOB: 08/14/1941 Divorced / Language: Andrea Farmer / Race: White Female   History of Present Illness  The patient is a 78 year old female who presents with a complaint of Breast problems. We're asked to see the patient in consultation by Dr. Lona Farmer to evaluate her for a complex sclerosing lesion of the right breast. The patient is a 78 year old white female who recently developed some pain in the right breast. She brought this to the attention of her medical doctor who ordered a mammogram. The mammogram showed a small area of distortion in the upper outer right breast. This was biopsied and came back as a complex sclerosing lesion. She has a history of other benign biopsies.   Past Surgical History Breast Mass; Local Excision  Bilateral. Hysterectomy (not due to cancer) - Partial  Knee Surgery  Bilateral.  Diagnostic Studies History  Colonoscopy  1-5 years ago Mammogram  within last year Pap Smear  1-5 years ago  Allergies No Known Drug Allergies   Medication History  Rosuvastatin Calcium (20MG  Tablet, Oral) Active. hydroCHLOROthiazide (25MG  Tablet, Oral) Active. amLODIPine Besy-Benazepril HCl (5-40MG  Capsule, Oral) Active. metFORMIN HCl ER (500MG  Tablet ER 24HR, Oral) Active. Align (Oral) Active. Nasonex (50MCG/ACT Suspension, Nasal) Active. Aspirin (81MG  Tablet, Oral) Active. Vitamin D (1000UNIT Tablet, Oral) Active. Co Q-10 (200MG  Capsule, Oral) Active. Turmeric (500MG  Tablet, Oral) Active. Multivitamin Adults 50+ (Oral) Active. Vitamin E (400UNIT Tablet, Oral) Active. Vitamin B-12 (1000MCG Tablet, Oral) Active. Calcium-Vitamin D (600MG  Tablet Chewable, Oral) Active. Medications Reconciled  Social History Alcohol use  Occasional alcohol use. Caffeine use  Coffee, Tea. No drug use  Tobacco use  Former smoker.  Family History  Kidney Disease  Mother. Migraine Headache   Father.  Pregnancy / Birth History Age at menarche  78 years. Age of menopause  27-55 Gravida  0 Para  0  Other Problems  Diabetes Mellitus  Heart murmur  Hemorrhoids  High blood pressure     Review of Systems  General Not Present- Appetite Loss, Chills, Fatigue, Fever, Night Sweats, Weight Gain and Weight Loss. Skin Not Present- Change in Wart/Mole, Dryness, Hives, Jaundice, New Lesions, Non-Healing Wounds, Rash and Ulcer. HEENT Present- Wears glasses/contact lenses. Not Present- Earache, Hearing Loss, Hoarseness, Nose Bleed, Oral Ulcers, Ringing in the Ears, Seasonal Allergies, Sinus Pain, Sore Throat, Visual Disturbances and Yellow Eyes. Respiratory Not Present- Bloody sputum, Chronic Cough, Difficulty Breathing, Snoring and Wheezing. Breast Present- Breast Mass and Breast Pain. Not Present- Nipple Discharge and Skin Changes. Cardiovascular Not Present- Chest Pain, Difficulty Breathing Lying Down, Leg Cramps, Palpitations, Rapid Heart Rate, Shortness of Breath and Swelling of Extremities. Gastrointestinal Not Present- Abdominal Pain, Bloating, Bloody Stool, Change in Bowel Habits, Chronic diarrhea, Constipation, Difficulty Swallowing, Excessive gas, Gets full quickly at meals, Hemorrhoids, Indigestion, Nausea, Rectal Pain and Vomiting. Female Genitourinary Not Present- Frequency, Nocturia, Painful Urination, Pelvic Pain and Urgency. Musculoskeletal Not Present- Back Pain, Joint Pain, Joint Stiffness, Muscle Pain, Muscle Weakness and Swelling of Extremities. Neurological Not Present- Decreased Memory, Fainting, Headaches, Numbness, Seizures, Tingling, Tremor, Trouble walking and Weakness. Psychiatric Not Present- Anxiety, Bipolar, Change in Sleep Pattern, Depression, Fearful and Frequent crying. Endocrine Present- New Diabetes. Not Present- Cold Intolerance, Excessive Hunger, Hair Changes, Heat Intolerance and Hot flashes. Hematology Not Present- Blood Thinners, Easy  Bruising, Excessive bleeding, Gland problems, HIV and Persistent Infections.  Vitals  Weight: 173.5 lb Height: 5.1in Body Surface Area: 0.29 m Body Mass Index: 4689.83 kg/m  Temp.: 98.20F  Pulse: 89 (Regular)  P.OX: 98% (Room air) BP: 130/82(Sitting, Left Arm, Standard)       Physical Exam  General Mental Status-Alert. General Appearance-Consistent with stated age. Hydration-Well hydrated. Voice-Normal.  Head and Neck Head-normocephalic, atraumatic with no lesions or palpable masses. Trachea-midline. Thyroid Gland Characteristics - normal size and consistency.  Eye Eyeball - Bilateral-Extraocular movements intact. Sclera/Conjunctiva - Bilateral-No scleral icterus.  Chest and Lung Exam Chest and lung exam reveals -quiet, even and easy respiratory effort with no use of accessory muscles and on auscultation, normal breath sounds, no adventitious sounds and normal vocal resonance. Inspection Chest Wall - Normal. Back - normal.  Breast Note: There is a small palpable bruise in the upper outer right breast. Other than this there is no other palpable mass in either breast. There is no palpable axillary, supraclavicular, or cervical lymphadenopathy.   Cardiovascular Cardiovascular examination reveals -normal heart sounds, regular rate and rhythm with no murmurs and normal pedal pulses bilaterally.  Abdomen Inspection Inspection of the abdomen reveals - No Hernias. Skin - Scar - no surgical scars. Palpation/Percussion Palpation and Percussion of the abdomen reveal - Soft, Non Tender, No Rebound tenderness, No Rigidity (guarding) and No hepatosplenomegaly. Auscultation Auscultation of the abdomen reveals - Bowel sounds normal.  Neurologic Neurologic evaluation reveals -alert and oriented x 3 with no impairment of recent or remote memory. Mental Status-Normal.  Musculoskeletal Normal Exam - Left-Upper Extremity Strength Normal and  Lower Extremity Strength Normal. Normal Exam - Right-Upper Extremity Strength Normal and Lower Extremity Strength Normal.  Lymphatic Head & Neck  General Head & Neck Lymphatics: Bilateral - Description - Normal. Axillary  General Axillary Region: Bilateral - Description - Normal. Tenderness - Non Tender. Femoral & Inguinal  Generalized Femoral & Inguinal Lymphatics: Bilateral - Description - Normal. Tenderness - Non Tender.    Assessment & Plan   SCLEROSING ADENOSIS OF BREAST, RIGHT (N60.21) Impression: The patient appears to have a small area of complex sclerosing lesion in the upper outer right breast. Because there is a small chance of missing a more significant lesion and thick it would be reasonable to remove the area. She would also like to have this done. I have Discussed with her in detail the risks and benefits of the operation to remove this area as well as some of the technical aspects and she understands and wishes to proceed. I'll plan for a right breast radioactive seed localized lumpectomy  Current Plans Pt Education - Breast Diseases: discussed with patient and provided information.

## 2019-04-27 NOTE — Discharge Instructions (Signed)
No Tylenol until 5:30pm, no ibuprofen until 7:30pm    Post Anesthesia Home Care Instructions  Activity: Get plenty of rest for the remainder of the day. A responsible individual must stay with you for 24 hours following the procedure.  For the next 24 hours, DO NOT: -Drive a car -Paediatric nurse -Drink alcoholic beverages -Take any medication unless instructed by your physician -Make any legal decisions or sign important papers.  Meals: Start with liquid foods such as gelatin or soup. Progress to regular foods as tolerated. Avoid greasy, spicy, heavy foods. If nausea and/or vomiting occur, drink only clear liquids until the nausea and/or vomiting subsides. Call your physician if vomiting continues.  Special Instructions/Symptoms: Your throat may feel dry or sore from the anesthesia or the breathing tube placed in your throat during surgery. If this causes discomfort, gargle with warm salt water. The discomfort should disappear within 24 hours.  If you had a scopolamine patch placed behind your ear for the management of post- operative nausea and/or vomiting:  1. The medication in the patch is effective for 72 hours, after which it should be removed.  Wrap patch in a tissue and discard in the trash. Wash hands thoroughly with soap and water. 2. You may remove the patch earlier than 72 hours if you experience unpleasant side effects which may include dry mouth, dizziness or visual disturbances. 3. Avoid touching the patch. Wash your hands with soap and water after contact with the patch.

## 2019-04-27 NOTE — Anesthesia Preprocedure Evaluation (Signed)
Anesthesia Evaluation  Patient identified by MRN, date of birth, ID band Patient awake    Reviewed: Allergy & Precautions, NPO status , Patient's Chart, lab work & pertinent test results  History of Anesthesia Complications Negative for: history of anesthetic complications  Airway Mallampati: II  TM Distance: >3 FB Neck ROM: Full    Dental  (+) Dental Advisory Given   Pulmonary neg pulmonary ROS,    breath sounds clear to auscultation       Cardiovascular hypertension, Pt. on medications  Rhythm:Regular Rate:Normal     Neuro/Psych negative neurological ROS  negative psych ROS   GI/Hepatic Neg liver ROS, GERD  ,  Endo/Other  negative endocrine ROSdiabetes  Renal/GU negative Renal ROS     Musculoskeletal  (+) Arthritis ,   Abdominal   Peds  Hematology negative hematology ROS (+)   Anesthesia Other Findings   Reproductive/Obstetrics                             Anesthesia Physical Anesthesia Plan  ASA: II  Anesthesia Plan: General   Post-op Pain Management:    Induction: Intravenous  PONV Risk Score and Plan: 3 and Propofol infusion, TIVA, Ondansetron and Dexamethasone  Airway Management Planned: LMA  Additional Equipment: None  Intra-op Plan:   Post-operative Plan: Extubation in OR  Informed Consent: I have reviewed the patients History and Physical, chart, labs and discussed the procedure including the risks, benefits and alternatives for the proposed anesthesia with the patient or authorized representative who has indicated his/her understanding and acceptance.     Dental advisory given  Plan Discussed with: CRNA and Surgeon  Anesthesia Plan Comments:         Anesthesia Quick Evaluation

## 2019-04-28 NOTE — Anesthesia Postprocedure Evaluation (Signed)
Anesthesia Post Note  Patient: Andrea Farmer  Procedure(s) Performed: RIGHT BREAST LUMPECTOMY WITH RADIOACTIVE SEED LOCALIZATION (Right Breast)     Patient location during evaluation: PACU Anesthesia Type: General Level of consciousness: awake and alert Pain management: pain level controlled Vital Signs Assessment: post-procedure vital signs reviewed and stable Respiratory status: spontaneous breathing, nonlabored ventilation, respiratory function stable and patient connected to nasal cannula oxygen Cardiovascular status: blood pressure returned to baseline and stable Postop Assessment: no apparent nausea or vomiting Anesthetic complications: no    Last Vitals:  Vitals:   04/27/19 1445 04/27/19 1513  BP: (!) 107/54 (!) 119/51  Pulse: 71 73  Resp: 18   Temp:  36.6 C  SpO2: 98% 100%    Last Pain:  Vitals:   04/27/19 1119  TempSrc: Oral  PainSc: 0-No pain                 Eagle Pitta

## 2019-04-30 ENCOUNTER — Encounter (HOSPITAL_BASED_OUTPATIENT_CLINIC_OR_DEPARTMENT_OTHER): Payer: Self-pay | Admitting: General Surgery

## 2019-04-30 LAB — GLUCOSE, CAPILLARY: Glucose-Capillary: 128 mg/dL — ABNORMAL HIGH (ref 70–99)

## 2019-05-03 ENCOUNTER — Other Ambulatory Visit: Payer: Self-pay | Admitting: Nurse Practitioner

## 2019-05-03 DIAGNOSIS — M48061 Spinal stenosis, lumbar region without neurogenic claudication: Secondary | ICD-10-CM | POA: Diagnosis not present

## 2019-05-03 DIAGNOSIS — L821 Other seborrheic keratosis: Secondary | ICD-10-CM | POA: Diagnosis not present

## 2019-05-03 DIAGNOSIS — N900 Mild vulvar dysplasia: Secondary | ICD-10-CM | POA: Diagnosis not present

## 2019-05-14 ENCOUNTER — Ambulatory Visit: Payer: PPO

## 2019-05-17 DIAGNOSIS — H2513 Age-related nuclear cataract, bilateral: Secondary | ICD-10-CM | POA: Diagnosis not present

## 2019-06-05 DIAGNOSIS — M47816 Spondylosis without myelopathy or radiculopathy, lumbar region: Secondary | ICD-10-CM | POA: Diagnosis not present

## 2019-06-05 DIAGNOSIS — M48061 Spinal stenosis, lumbar region without neurogenic claudication: Secondary | ICD-10-CM | POA: Diagnosis not present

## 2019-06-13 DIAGNOSIS — E7800 Pure hypercholesterolemia, unspecified: Secondary | ICD-10-CM | POA: Diagnosis not present

## 2019-06-13 DIAGNOSIS — E1169 Type 2 diabetes mellitus with other specified complication: Secondary | ICD-10-CM | POA: Diagnosis not present

## 2019-06-13 DIAGNOSIS — I10 Essential (primary) hypertension: Secondary | ICD-10-CM | POA: Diagnosis not present

## 2019-06-19 DIAGNOSIS — Z0000 Encounter for general adult medical examination without abnormal findings: Secondary | ICD-10-CM | POA: Diagnosis not present

## 2019-06-19 DIAGNOSIS — Z23 Encounter for immunization: Secondary | ICD-10-CM | POA: Diagnosis not present

## 2019-06-19 DIAGNOSIS — M1990 Unspecified osteoarthritis, unspecified site: Secondary | ICD-10-CM | POA: Diagnosis not present

## 2019-06-19 DIAGNOSIS — E1169 Type 2 diabetes mellitus with other specified complication: Secondary | ICD-10-CM | POA: Diagnosis not present

## 2019-06-19 DIAGNOSIS — I10 Essential (primary) hypertension: Secondary | ICD-10-CM | POA: Diagnosis not present

## 2019-06-19 DIAGNOSIS — M549 Dorsalgia, unspecified: Secondary | ICD-10-CM | POA: Diagnosis not present

## 2019-06-19 DIAGNOSIS — M8580 Other specified disorders of bone density and structure, unspecified site: Secondary | ICD-10-CM | POA: Diagnosis not present

## 2019-06-21 ENCOUNTER — Other Ambulatory Visit: Payer: Self-pay | Admitting: Family Medicine

## 2019-06-21 DIAGNOSIS — M8580 Other specified disorders of bone density and structure, unspecified site: Secondary | ICD-10-CM

## 2019-06-26 DIAGNOSIS — H2511 Age-related nuclear cataract, right eye: Secondary | ICD-10-CM | POA: Diagnosis not present

## 2019-06-26 DIAGNOSIS — H25811 Combined forms of age-related cataract, right eye: Secondary | ICD-10-CM | POA: Diagnosis not present

## 2019-07-11 DIAGNOSIS — Z7251 High risk heterosexual behavior: Secondary | ICD-10-CM | POA: Diagnosis not present

## 2019-07-11 DIAGNOSIS — Z9189 Other specified personal risk factors, not elsewhere classified: Secondary | ICD-10-CM | POA: Diagnosis not present

## 2019-07-24 DIAGNOSIS — H25812 Combined forms of age-related cataract, left eye: Secondary | ICD-10-CM | POA: Diagnosis not present

## 2019-07-24 DIAGNOSIS — H2512 Age-related nuclear cataract, left eye: Secondary | ICD-10-CM | POA: Diagnosis not present

## 2019-08-01 ENCOUNTER — Other Ambulatory Visit: Payer: Self-pay | Admitting: Cardiology

## 2019-08-01 DIAGNOSIS — E7800 Pure hypercholesterolemia, unspecified: Secondary | ICD-10-CM

## 2019-08-01 DIAGNOSIS — I10 Essential (primary) hypertension: Secondary | ICD-10-CM

## 2019-08-01 DIAGNOSIS — I2510 Atherosclerotic heart disease of native coronary artery without angina pectoris: Secondary | ICD-10-CM

## 2019-08-06 ENCOUNTER — Ambulatory Visit: Payer: PPO | Admitting: Cardiology

## 2019-08-06 ENCOUNTER — Other Ambulatory Visit: Payer: Self-pay

## 2019-08-06 ENCOUNTER — Encounter: Payer: Self-pay | Admitting: Cardiology

## 2019-08-06 ENCOUNTER — Telehealth: Payer: Self-pay | Admitting: Pharmacist

## 2019-08-06 VITALS — BP 138/82 | HR 66 | Ht 64.5 in | Wt 176.0 lb

## 2019-08-06 DIAGNOSIS — E7800 Pure hypercholesterolemia, unspecified: Secondary | ICD-10-CM

## 2019-08-06 DIAGNOSIS — I10 Essential (primary) hypertension: Secondary | ICD-10-CM | POA: Diagnosis not present

## 2019-08-06 DIAGNOSIS — M48061 Spinal stenosis, lumbar region without neurogenic claudication: Secondary | ICD-10-CM | POA: Diagnosis not present

## 2019-08-06 DIAGNOSIS — I2510 Atherosclerotic heart disease of native coronary artery without angina pectoris: Secondary | ICD-10-CM | POA: Diagnosis not present

## 2019-08-06 MED ORDER — ROSUVASTATIN CALCIUM 20 MG PO TABS: 20.0000 mg | ORAL_TABLET | Freq: Every day | ORAL | 2 refills | Status: DC

## 2019-08-06 MED ORDER — EZETIMIBE 10 MG PO TABS: 10.0000 mg | ORAL_TABLET | Freq: Every day | ORAL | 2 refills | Status: DC

## 2019-08-06 NOTE — Telephone Encounter (Signed)
Unable to successful complete a tier exception for zetia on covermymeds. Called [employer] and submitted over the phone. Expect a determination within 72 hr. If unable to get tier exception, can consider [employer] assistance as currently is a tier 3 ($80/90ds).

## 2019-08-06 NOTE — Patient Instructions (Signed)
Medication Instructions:   START TAKING ZETIA 10 MG ONCE DAILY  OUR PHARMACIST WILL BE WORKING ON AN APPLICATION FOR YOU, IN REGARDS TO THIS MEDICATION, TO GET THE COST DOWN.  *If you need a refill on your cardiac medications before your next appointment, please call your pharmacy*    Follow-Up: At [facility], you and your health needs are our priority.  As part of our continuing mission to provide you with exceptional heart care, we have created designated Provider Care Teams.  These Care Teams include your primary Cardiologist (physician) and Advanced Practice Providers (APPs -  Physician Assistants and Nurse Practitioners) who all work together to provide you with the care you need, when you need it.  Your next appointment:   12 months  The format for your next appointment:   In Person  Provider:   Ena Dawley, MD

## 2019-08-06 NOTE — Progress Notes (Signed)
Cardiology Office Note    Date:  08/06/2019   ID:  [name], DOB 29-Oct-1940, MRN [id number]  PCP:  Lawerance Cruel, MD  Cardiologist: Ena Dawley, MD  Referring physician: Melinda Crutch, MD  Chief complaint: PVCs, shortness of breath.  History of Present Illness:  Andrea Farmer is a 78 y.o. female with prior medical history of hyperlipidemia, hypertension, who recently underwent screening colonoscopy and observed that she has an extra beats on her telemetry. She states that every once a while she feels a few second lasting palpitations, however there no associated with any chest pain, dizziness or syncope. The patient states that she has always been very active but lately has noticed that certain activities take her longer or she has to take a break in between. This is a very slow gradual change rather than a abrupt change she still able to do all activities of daily living without any significant problems.  She doesn't have a family history of sudden cardiac death or coronary artery disease. She has a very distant history of smoking.  06/02/2017 - 1 year follow up, The patient underwent 24 hour Holter monitoring that showed 700 PVCs in 24 hours, sinus bradycardia to sinus rhythm. LVEF 60-65% on echocardiogram. She underwent coronary CT angiography that showed coronary calcium score of 681. This was 62 percentile for age and sex matched control. Normal coronary origin.  Right dominance. 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 An aggressive medical management is recommended. She was started on Crestor 20 mg daily that she is tolerating well. She exercises on a bicycle 3 times a week. She is difficulty with walking as an exercise as she had bilateral knee replacement. She denies any chest pain or shortness of breath, she eats very healthy Mediterranean style diet. No further palpitations.  06/07/2018 -1 year follow-up, she feels and looks great, she just  came from 1 months along road trip to [place] and back and she greatly enjoyed it.  She denies any chest pain shortness of breath no lower extremity edema orthopnea proximal nocturnal dyspnea no palpitations.  She is compliant with her medications.  She needs refills today.  08/06/2019, the patient is coming after 1 year, she has been doing well, she has been compliant with her medications and has no side effects.  She is cooking for herself and eats fairly healthy and exercises 3 times a week on a stationary bike 3 times a week on a step master.  She is having arthritic problems when walking on the concrete.   Past Medical History:  Diagnosis Date  . Acid reflux   . Back pain    arthritis  . Cataract    immature but doesn't know which eye  . Diabetes mellitus without complication ([place])   . High cholesterol    takes Crestor daily  . History of colon polyps    benign  . History of migraine   . Hypertension    takes Lotrel and HCTZ dailydaily  . Irritable bowel syndrome 03/11/2016  . Joint pain   . Joint swelling   . Osteoarthritis of knee 03/11/2016  . Pneumonia    30 plus yrs ago    Past Surgical History:  Procedure Laterality Date  . ABDOMINAL HYSTERECTOMY    . BREAST BIOPSY Right 05/14/2015  . BREAST EXCISIONAL BIOPSY Left   . BREAST EXCISIONAL BIOPSY Right   . BREAST LUMPECTOMY WITH RADIOACTIVE SEED LOCALIZATION  Right 04/27/2019   Procedure: RIGHT BREAST LUMPECTOMY WITH RADIOACTIVE SEED LOCALIZATION;  Surgeon: Jovita Kussmaul, MD;  Location: [facility];  Service: General;  Laterality: Right;  . RECTAL SURGERY     x 3  . REPLACEMENT TOTAL KNEE BILATERAL    . SYNOVECTOMY Left 07/21/2015   Procedure: LEFT KNEE SYNOVECTOMY with POLY EXCHANGE;  Surgeon: Vickey Huger, MD;  Location: [facility];  Service: Orthopedics;  Laterality: Left;  . TOTAL KNEE REVISION Right 07/05/2016   Procedure: TOTAL KNEE REVISION WITH SCAR DEBRIDEMENT/PATELLA REVISION WITH POLY EXCHANGE;   Surgeon: Vickey Huger, MD;  Location: [facility];  Service: Orthopedics;  Laterality: Right;    Current Medications: Outpatient Medications Prior to Visit  Medication Sig Dispense Refill  . Alpha-D-Galactosidase (BEANO) TABS Take by mouth.    Marland Kitchen amLODipine-benazepril (LOTREL) 5-40 MG capsule Take 1 capsule by mouth daily.    Marland Kitchen aspirin EC 81 MG tablet Take 1 tablet (81 mg total) by mouth daily. 90 tablet 3  . Calcium Carbonate-Vitamin D (CALCIUM 600+D) 600-400 MG-UNIT tablet Take 1 tablet by mouth 3 (three) times a week.     . Coenzyme Q10 (CO Q 10 PO) Take 200 mg by mouth every other day.     . fexofenadine (ALLEGRA) 180 MG tablet Take 180 mg by mouth daily as needed for allergies or rhinitis.    . Glucosamine-Chondroit-Vit C-Mn (GLUCOSAMINE 1500 COMPLEX PO) Take 1 tablet by mouth every other day.    . hydrochlorothiazide (HYDRODIURIL) 25 MG tablet Take 25 mg by mouth daily.    . metFORMIN (GLUCOPHAGE) 500 MG tablet Take by mouth daily with breakfast.    . mometasone (NASONEX) 50 MCG/ACT nasal spray Place 2 sprays into the nose 3 (three) times a week.     . Multiple Vitamin (MULTIVITAMIN WITH MINERALS) TABS tablet Take 1 tablet by mouth 3 (three) times a week.     . Omega-3 Fatty Acids (OMEGA 3 PO) Take 1 capsule by mouth 3 (three) times a week.     . polyethylene glycol (MIRALAX / GLYCOLAX) 17 g packet Take 17 g by mouth every other day.     . Probiotic Product (ALIGN) 4 MG CAPS Take 4 mg by mouth daily as needed. supplement    . psyllium (HYDROCIL/METAMUCIL) 95 % PACK Take 1 packet by mouth every other day.     . traMADol (ULTRAM) 50 MG tablet Take 1-2 tablets (50-100 mg total) by mouth every 6 (six) hours. 60 tablet 0  . TURMERIC PO Take 500 mg by mouth every other day.    . vitamin E 400 UNIT capsule Take 400 Units by mouth every other day.    . rosuvastatin (CRESTOR) 20 MG tablet TAKE 1 TABLET BY MOUTH EVERY DAY 90 tablet 0  . HYDROcodone-acetaminophen (NORCO/VICODIN) 5-325 MG tablet Take 1  tablet by mouth every 6 (six) hours as needed for moderate pain or severe pain. 15 tablet 0   No facility-administered medications prior to visit.      Allergies:   No known allergies   Social History   Socioeconomic History  . Marital status: Divorced    Spouse name: Not on file  . Number of children: Not on file  . Years of education: Not on file  . Highest education level: Not on file  Occupational History  . Not on file  Social Needs  . Financial resource strain: Not on file  . Food insecurity    Worry: Not on file  Inability: Not on file  . Transportation needs    Medical: Not on file    Non-medical: Not on file  Tobacco Use  . Smoking status: Never Smoker  . Smokeless tobacco: Never Used  Substance and Sexual Activity  . Alcohol use: Yes    Comment: WINE OCC  . Drug use: No  . Sexual activity: Not on file  Lifestyle  . Physical activity    Days per week: Not on file    Minutes per session: Not on file  . Stress: Not on file  Relationships  . Social Herbalist on phone: Not on file    Gets together: Not on file    Attends religious service: Not on file    Active member of club or organization: Not on file    Attends meetings of clubs or organizations: Not on file    Relationship status: Not on file  Other Topics Concern  . Not on file  Social History Narrative  . Not on file     Family History:  The patient's family history includes Aneurysm in her father; Cancer in her mother.   ROS:   Please see the history of present illness.    ROS All other systems reviewed and are negative.   PHYSICAL EXAM:   VS:  BP 138/82   Pulse 66   Ht 5' 4.5" (1.638 m)   Wt 176 lb (79.8 kg)   BMI 29.74 kg/m    GEN: Well nourished, well developed, in no acute distress  HEENT: normal  Neck: no JVD, carotid bruits, or masses Cardiac: RRR; no murmurs, rubs, or gallops,no edema  Respiratory:  clear to auscultation bilaterally, normal work of breathing GI:  soft, nontender, nondistended, + BS MS: no deformity or atrophy  Skin: warm and dry, no rash Neuro:  Alert and Oriented x 3, Strength and sensation are intact Psych: euthymic mood, full affect  Wt Readings from Last 3 Encounters:  08/06/19 176 lb (79.8 kg)  04/27/19 176 lb 2.4 oz (79.9 kg)  07/06/18 185 lb 6.4 oz (84.1 kg)     Studies/Labs Reviewed:   EKG:  EKG is ordered today.  And personally reviewed. Normal sinus rhythm otherwise normal EKG when compared to last one PVCs are no longer present.  Recent Labs: 04/24/2019: BUN 20; Creatinine, Ser 0.66; Potassium 4.1; Sodium 140   Lipid Panel    Component Value Date/Time   CHOL 186 06/08/2018 1014   CHOL 164 05/20/2016 1127   TRIG 83 06/08/2018 1014   TRIG 88 05/20/2016 1127   HDL 58 06/08/2018 1014   HDL 55 05/20/2016 1127   CHOLHDL 3.2 06/08/2018 1014   CHOLHDL 3.0 05/20/2016 1127   LDLCALC 111 (H) 06/08/2018 1014   LDLCALC 91 05/20/2016 1127   Additional studies/ records that were reviewed today include:  As per history of present illness.  TTE: 03/12/16 - Left ventricle: The cavity size was normal. Systolic function was   normal. The estimated ejection fraction was in the range of 60%   to 65%. Wall motion was normal; there were no regional wall   motion abnormalities. Doppler parameters are consistent with   abnormal left ventricular relaxation (grade 1 diastolic   dysfunction). Doppler parameters are consistent with elevated   ventricular end-diastolic filling pressure. - Aortic valve: Trileaflet; normal thickness leaflets. There was no   regurgitation. - Aortic root: The aortic root was normal in size. - Mitral valve: Calcified annulus. Mildly thickened leaflets .  There was mild regurgitation. - Left atrium: The atrium was normal in size. - Right ventricle: Systolic function was normal. - Tricuspid valve: There was mild regurgitation. - Pulmonary arteries: Systolic pressure was mildly increased. PA   peak  pressure: 40 mm Hg (S). - Inferior vena cava: The vessel was normal in size. - Pericardium, extracardiac: There was no pericardial effusion.  EKG performed today 06/07/2017 was personally reviewed which shows normal sinus rhythm normal EKG unchanged from prior.  ASSESSMENT:    1. Pure hypercholesterolemia   2. Coronary artery disease involving native coronary artery of native heart without angina pectoris   3. Essential (primary) hypertension      PLAN:  In order of problems listed above:  1. CAD, moderate nonobstructive coronary CTA, continue aspirin, Crestor, amlodipine, she is asymptomatic.  Her EKG is completely normal.  She brings labs from Dr. Harrington Challenger and her most recent LDL is 10, ideally she should be under 70 given the fact that she is diabetic, I will add the Zetia 10 mg daily to her regimen.  2.  Hypertension -well-controlled on current regimen.   3.  Hyperlipidemia, on Crestor 20 mg daily, will add Zetia.Marland Kitchen   4.  Impaired glucose tolerance -on Metformin, followed by Dr. Harrington Challenger, most recent hemoglobin A1c 5.9%.  Medication Adjustments/Labs and Tests Ordered: Current medicines are reviewed at length with the patient today.  Concerns regarding medicines are outlined above.  Medication changes, Labs and Tests ordered today are listed in the Patient Instructions below. Patient Instructions  Medication Instructions:   START TAKING ZETIA 10 MG ONCE DAILY  OUR PHARMACIST WILL BE WORKING ON AN APPLICATION FOR YOU, IN REGARDS TO THIS MEDICATION, TO GET THE COST DOWN.  *If you need a refill on your cardiac medications before your next appointment, please call your pharmacy*    Follow-Up: At [facility], you and your health needs are our priority.  As part of our continuing mission to provide you with exceptional heart care, we have created designated Provider Care Teams.  These Care Teams include your primary Cardiologist (physician) and Advanced Practice Providers (APPs -   Physician Assistants and Nurse Practitioners) who all work together to provide you with the care you need, when you need it.  Your next appointment:   12 months  The format for your next appointment:   In Person  Provider:   Ena Dawley, MD        Signed, Ena Dawley, MD  08/06/2019 12:44 PM    [address], [place], [place]  [address] Phone: [phone]; Fax: [phone]

## 2019-08-07 NOTE — Telephone Encounter (Signed)
Received call from [employer] regarding ezetimibe tier exception. Provided dx code, LDL level, and current therapy. Was advised that we will receive a decision via fax in 24-48 hours.

## 2019-08-07 NOTE — Telephone Encounter (Signed)
Received fax from [facility] requesting more information for tier exception. Forms filled out and documents faxed back

## 2019-08-08 NOTE — Telephone Encounter (Signed)
Received fax from [facility] [employer] that patient zetia was approved. However we did request a tier exception, which their was no mention of. Called the pharmacy who said patient picked up 90 DS for ~30 dollars. Will follow up on tier exception

## 2019-08-10 ENCOUNTER — Encounter

## 2019-08-16 ENCOUNTER — Other Ambulatory Visit: Payer: Self-pay

## 2019-08-16 ENCOUNTER — Ambulatory Visit
Admission: RE | Admit: 2019-08-16 | Discharge: 2019-08-16 | Disposition: A | Discharge status: Home / Self Care | Payer: PPO | Source: Ambulatory Visit | Attending: Family Medicine | Admitting: Family Medicine

## 2019-08-16 DIAGNOSIS — M85852 Other specified disorders of bone density and structure, left thigh: Secondary | ICD-10-CM | POA: Diagnosis not present

## 2019-08-16 DIAGNOSIS — M8580 Other specified disorders of bone density and structure, unspecified site: Secondary | ICD-10-CM

## 2019-08-16 DIAGNOSIS — Z780 Asymptomatic menopausal state: Secondary | ICD-10-CM | POA: Diagnosis not present

## 2019-08-22 DIAGNOSIS — R143 Flatulence: Secondary | ICD-10-CM | POA: Diagnosis not present

## 2019-08-22 DIAGNOSIS — K5902 Outlet dysfunction constipation: Secondary | ICD-10-CM | POA: Diagnosis not present

## 2019-08-22 DIAGNOSIS — Z86010 Personal history of colonic polyps: Secondary | ICD-10-CM | POA: Diagnosis not present

## 2019-09-03 DIAGNOSIS — M47816 Spondylosis without myelopathy or radiculopathy, lumbar region: Secondary | ICD-10-CM | POA: Diagnosis not present

## 2019-09-12 ENCOUNTER — Other Ambulatory Visit: Payer: Self-pay | Admitting: Family Medicine

## 2019-09-12 DIAGNOSIS — Z1231 Encounter for screening mammogram for malignant neoplasm of breast: Secondary | ICD-10-CM

## 2019-09-13 DIAGNOSIS — M545 Low back pain: Secondary | ICD-10-CM | POA: Diagnosis not present

## 2019-09-13 DIAGNOSIS — M47816 Spondylosis without myelopathy or radiculopathy, lumbar region: Secondary | ICD-10-CM | POA: Diagnosis not present

## 2019-10-12 DIAGNOSIS — Z923 Personal history of irradiation: Secondary | ICD-10-CM

## 2019-10-12 HISTORY — DX: Personal history of irradiation: Z92.3

## 2019-11-06 ENCOUNTER — Ambulatory Visit: Payer: PPO

## 2019-11-15 ENCOUNTER — Ambulatory Visit: Payer: PPO | Attending: Internal Medicine

## 2019-11-15 DIAGNOSIS — Z23 Encounter for immunization: Secondary | ICD-10-CM | POA: Insufficient documentation

## 2019-11-15 NOTE — Progress Notes (Signed)
   Covid-19 Vaccination Clinic  Name:  Andrea Farmer    MRN: [id number] DOB: [birth year]  11/15/2019  [name] was observed post Covid-19 immunization for 15 minutes without incidence. She was provided with Vaccine Information Sheet and instruction to access the V-Safe system.   [name] was instructed to call 911 with any severe reactions post vaccine: Marland Kitchen Difficulty breathing  . Swelling of your face and throat  . A fast heartbeat  . A bad rash all over your body  . Dizziness and weakness    Immunizations Administered    Name Date Dose VIS Date Route   Pfizer COVID-19 Vaccine 11/15/2019  3:18 PM 0.3 mL 09/21/2019 Intramuscular   Manufacturer: [place]   Lot: [id number]   [place]: [id number]

## 2019-12-01 DIAGNOSIS — M79652 Pain in left thigh: Secondary | ICD-10-CM | POA: Diagnosis not present

## 2019-12-05 DIAGNOSIS — I10 Essential (primary) hypertension: Secondary | ICD-10-CM | POA: Diagnosis not present

## 2019-12-05 DIAGNOSIS — M179 Osteoarthritis of knee, unspecified: Secondary | ICD-10-CM | POA: Diagnosis not present

## 2019-12-05 DIAGNOSIS — E1169 Type 2 diabetes mellitus with other specified complication: Secondary | ICD-10-CM | POA: Diagnosis not present

## 2019-12-05 DIAGNOSIS — M1990 Unspecified osteoarthritis, unspecified site: Secondary | ICD-10-CM | POA: Diagnosis not present

## 2019-12-05 DIAGNOSIS — M8580 Other specified disorders of bone density and structure, unspecified site: Secondary | ICD-10-CM | POA: Diagnosis not present

## 2019-12-05 DIAGNOSIS — Z7984 Long term (current) use of oral hypoglycemic drugs: Secondary | ICD-10-CM | POA: Diagnosis not present

## 2019-12-05 DIAGNOSIS — E7800 Pure hypercholesterolemia, unspecified: Secondary | ICD-10-CM | POA: Diagnosis not present

## 2019-12-10 ENCOUNTER — Ambulatory Visit: Payer: PPO | Attending: Internal Medicine

## 2019-12-10 DIAGNOSIS — Z23 Encounter for immunization: Secondary | ICD-10-CM | POA: Insufficient documentation

## 2019-12-10 NOTE — Progress Notes (Signed)
   Covid-19 Vaccination Clinic  Name:  Andrea Farmer    MRN: [id number] DOB: 04-17-1941  12/10/2019  [name] was observed post Covid-19 immunization for 15 minutes without incidence. She was provided with Vaccine Information Sheet and instruction to access the V-Safe system.   [name] was instructed to call 911 with any severe reactions post vaccine: Marland Kitchen Difficulty breathing  . Swelling of your face and throat  . A fast heartbeat  . A bad rash all over your body  . Dizziness and weakness    Immunizations Administered    Name Date Dose VIS Date Route   Pfizer COVID-19 Vaccine 12/10/2019  4:14 PM 0.3 mL 09/21/2019 Intramuscular   Manufacturer: [place]   Lot: [id number]   [place]: [id number]

## 2019-12-14 DIAGNOSIS — E1169 Type 2 diabetes mellitus with other specified complication: Secondary | ICD-10-CM | POA: Diagnosis not present

## 2019-12-19 ENCOUNTER — Other Ambulatory Visit: Payer: Self-pay

## 2019-12-19 ENCOUNTER — Emergency Department (HOSPITAL_COMMUNITY): Payer: PPO

## 2019-12-19 ENCOUNTER — Emergency Department (HOSPITAL_COMMUNITY)
Admission: EM | Admit: 2019-12-19 | Discharge: 2019-12-19 | Disposition: A | Discharge status: Home / Self Care | Payer: PPO | Attending: Emergency Medicine | Admitting: Emergency Medicine

## 2019-12-19 ENCOUNTER — Telehealth: Payer: Self-pay | Admitting: Cardiology

## 2019-12-19 DIAGNOSIS — I493 Ventricular premature depolarization: Secondary | ICD-10-CM | POA: Insufficient documentation

## 2019-12-19 DIAGNOSIS — R531 Weakness: Secondary | ICD-10-CM | POA: Insufficient documentation

## 2019-12-19 DIAGNOSIS — I10 Essential (primary) hypertension: Secondary | ICD-10-CM | POA: Diagnosis not present

## 2019-12-19 DIAGNOSIS — M549 Dorsalgia, unspecified: Secondary | ICD-10-CM | POA: Diagnosis not present

## 2019-12-19 DIAGNOSIS — M8580 Other specified disorders of bone density and structure, unspecified site: Secondary | ICD-10-CM | POA: Diagnosis not present

## 2019-12-19 DIAGNOSIS — M1990 Unspecified osteoarthritis, unspecified site: Secondary | ICD-10-CM | POA: Diagnosis not present

## 2019-12-19 DIAGNOSIS — R001 Bradycardia, unspecified: Secondary | ICD-10-CM | POA: Diagnosis not present

## 2019-12-19 DIAGNOSIS — R002 Palpitations: Secondary | ICD-10-CM | POA: Diagnosis not present

## 2019-12-19 DIAGNOSIS — E1169 Type 2 diabetes mellitus with other specified complication: Secondary | ICD-10-CM | POA: Diagnosis not present

## 2019-12-19 DIAGNOSIS — Z7984 Long term (current) use of oral hypoglycemic drugs: Secondary | ICD-10-CM | POA: Diagnosis not present

## 2019-12-19 DIAGNOSIS — Z79899 Other long term (current) drug therapy: Secondary | ICD-10-CM | POA: Insufficient documentation

## 2019-12-19 DIAGNOSIS — R0602 Shortness of breath: Secondary | ICD-10-CM | POA: Insufficient documentation

## 2019-12-19 DIAGNOSIS — E119 Type 2 diabetes mellitus without complications: Secondary | ICD-10-CM | POA: Diagnosis not present

## 2019-12-19 DIAGNOSIS — I499 Cardiac arrhythmia, unspecified: Secondary | ICD-10-CM | POA: Diagnosis not present

## 2019-12-19 LAB — MAGNESIUM: Magnesium: 1.7 mg/dL (ref 1.7–2.4)

## 2019-12-19 LAB — CBC WITH DIFFERENTIAL/PLATELET
Abs Immature Granulocytes: 0.02 10*3/uL (ref 0.00–0.07)
Basophils Absolute: 0 10*3/uL (ref 0.0–0.1)
Basophils Relative: 1 %
Eosinophils Absolute: 0.1 10*3/uL (ref 0.0–0.5)
Eosinophils Relative: 1 %
HCT: 41.9 % (ref 36.0–46.0)
Hemoglobin: 13.4 g/dL (ref 12.0–15.0)
Immature Granulocytes: 0 %
Lymphocytes Relative: 29 %
Lymphs Abs: 2.3 10*3/uL (ref 0.7–4.0)
MCH: 30.1 pg (ref 26.0–34.0)
MCHC: 32 g/dL (ref 30.0–36.0)
MCV: 94.2 fL (ref 80.0–100.0)
Monocytes Absolute: 0.7 10*3/uL (ref 0.1–1.0)
Monocytes Relative: 9 %
Neutro Abs: 4.7 10*3/uL (ref 1.7–7.7)
Neutrophils Relative %: 60 %
Platelets: 326 10*3/uL (ref 150–400)
RBC: 4.45 MIL/uL (ref 3.87–5.11)
RDW: 12.9 % (ref 11.5–15.5)
WBC: 7.8 10*3/uL (ref 4.0–10.5)
nRBC: 0 % (ref 0.0–0.2)

## 2019-12-19 LAB — BASIC METABOLIC PANEL
Anion gap: 13 (ref 5–15)
BUN: 25 mg/dL — ABNORMAL HIGH (ref 8–23)
CO2: 23 mmol/L (ref 22–32)
Calcium: 9.8 mg/dL (ref 8.9–10.3)
Chloride: 103 mmol/L (ref 98–111)
Creatinine, Ser: 0.71 mg/dL (ref 0.44–1.00)
GFR calc Af Amer: >60 mL/min (ref >60)
GFR calc non Af Amer: >60 mL/min (ref >60)
Glucose, Bld: 140 mg/dL — ABNORMAL HIGH (ref 70–99)
Potassium: 3.3 mmol/L — ABNORMAL LOW (ref 3.5–5.1)
Sodium: 139 mmol/L (ref 135–145)

## 2019-12-19 LAB — TROPONIN I (HIGH SENSITIVITY): Troponin I (High Sensitivity): 9 ng/L (ref <18)

## 2019-12-19 MED ORDER — POTASSIUM CHLORIDE CRYS ER 20 MEQ PO TBCR
40.0000 meq | EXTENDED_RELEASE_TABLET | Freq: Once | ORAL | Status: AC
  Administered 2019-12-19: 40 meq via ORAL
  Filled 2019-12-19: qty 2

## 2019-12-19 MED ORDER — MAGNESIUM OXIDE 400 (241.3 MG) MG PO TABS
800.0000 mg | ORAL_TABLET | Freq: Once | ORAL | Status: AC
  Administered 2019-12-19: 20:00:00 800 mg via ORAL
  Filled 2019-12-19: qty 2

## 2019-12-19 NOTE — Telephone Encounter (Signed)
This call placed should've been a DOD call, for this call came from pts PCP Dr. Harrington Challenger, in regards to visit he had with the pt today for complaints of feeling sluggish and unbalanced. Dr. Harrington Challenger office did not get transferred to the DOD or to a triage nurse, when call placed.  As outlined in this message is direct information Dr. Melinda Crutch Nurse provided to our operators.  Tried to call Dr. Harrington Challenger office back at 4:30 pm to obtain more information, being this was not directly transferred to a triage nurse or DOD, and noted their office was closed. Called the pt myself to obtain more of what was going on, and the pt states she went in to see her PCP Dr. Harrington Challenger today for feeling sluggish, tired, and very unbalanced on her feet. Pt also had complaints of chest discomfort and palpitations.  Pt states she is sob and lightheaded.  She states she is not diaphoretic, but feels "nervous on her stomach and shaking all over."  Pt states she has not had pre-syncopal or syncopal episodes.  Pt states she is having difficulty ambulating and feels sluggish. Pt states that Dr. Harrington Challenger did an EKG on the pt and noted her HR was fluctuating between 40 bpm to over 120 bpm.  Per the pt, Dr. Alan Ripper Nurse was to fax this EKG, along with progress notes to our office to review.  While talking to the pt on the phone to obtain more of her complaints, she stated she was very sob, dizzy, feels palpitations and her HR skipping, she feels very unsteady when walking.  Pt was very fearful acting on the phone and crying. She states "I am very worried." Pt states that Dr. Harrington Challenger was concerned she may have new onset afib or some other kind of arrhythmia going on, which would be new for the pt.  Dr. Harrington Challenger wanted the pt to be seen tomorrow for new findings, but as I was talking to the pt to assist with getting an appt, she stated she feels so bad, she thinks she may just need to go ahead and go to the ER for further evaluation, for she states she cannot drive in  her condition, and her Husband cannot drive at all.  Pt asked that I call 911 for her now, so that she can be transferred to the ER now.  Called 911 for the pt and they are en route to her house now to transport her to [facility] ER.  Advised the pt to keep her front door open so that they can easily access her front door.  Advised the pt to have her meds with her.  Stayed on the phone for reassurance.  Informed the pt not to worry at all, that I will let Dr. Meda Coffee know that she is going to the ER now for complaints mentioned, and for possible new onset afib noted at her PCP office today, accompanied with symptoms. Pt verbalized understanding and agrees with this plan.

## 2019-12-19 NOTE — ED Provider Notes (Signed)
[facility] Provider Note   CSN: [id number] Arrival date & time: 12/19/19  1801     History Chief Complaint  Patient presents with  . Irregular Heartbeat    Andrea Farmer is a 79 y.o. female.  79 yo F with a chief complaints of palpitations.  Patient went to their family doctor for generalized fatigue and was planning on getting an A1c today.  While doing routine vitals it was noted that her heart rate was in the 40s.  The EKG machine apparently was fluctuating between the 40s and the 120s.  There is some concern for A. fib was discussed with their cardiologist and recommended she come to the ED for evaluation.  She denies chest pain or pressure denies cough congestion or fever denies nausea vomiting or diarrhea.  Denies abdominal pain.  Denies lower extremity edema.  Has had some shortness of breath is been transient off and on but nothing that she would consider serious.  Denies recent diet change.  States she has been eating and drinking normally.  Denies dark stool or blood in her stool.  Denies recent medication change.  The history is provided by the patient.  Illness Severity:  Moderate Onset quality:  Gradual Duration:  2 days Timing:  Intermittent Progression:  Waxing and waning Chronicity:  New Associated symptoms: fatigue   Associated symptoms: no chest pain, no congestion, no fever, no headaches, no myalgias, no nausea, no rhinorrhea, no shortness of breath, no vomiting and no wheezing        Past Medical History:  Diagnosis Date  . Acid reflux   . Back pain    arthritis  . Cataract    immature but doesn't know which eye  . Diabetes mellitus without complication ([place])   . High cholesterol    takes Crestor daily  . History of colon polyps    benign  . History of migraine   . Hypertension    takes Lotrel and HCTZ dailydaily  . Irritable bowel syndrome 03/11/2016  . Joint pain   . Joint swelling   . Osteoarthritis of knee  03/11/2016  . Pneumonia    30 plus yrs ago    Patient Active Problem List   Diagnosis Date Noted  . Abnormal computed tomography angiography (CTA) 04/08/2016  . Abnormal stress test 04/02/2016  . Cardiac ischemia 04/02/2016  . Pure hypercholesterolemia 03/11/2016  . Abdominal distension (gaseous) 03/11/2016  . Abdominal pain, epigastric 03/11/2016  . Environmental and seasonal allergies 03/11/2016  . Anal or rectal pain 03/11/2016  . Dyspepsia 03/11/2016  . Other abnormal glucose 03/11/2016  . Essential (primary) hypertension 03/11/2016  . Cardiac arrhythmia 03/11/2016  . Irritable bowel syndrome 03/11/2016  . Osteoarthritis of knee 03/11/2016  . Osteopenia 03/11/2016  . Pain in joint, lower leg 03/11/2016  . History of colonic polyps 03/11/2016  . Slow transit constipation 03/11/2016  . PVC (premature ventricular contraction) 03/11/2016  . SOB (shortness of breath) 03/11/2016  . 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 Hemorrhoid 12/07/2010    Past Surgical History:  Procedure Laterality Date  . ABDOMINAL HYSTERECTOMY    . BREAST BIOPSY Right 05/14/2015  . BREAST EXCISIONAL BIOPSY Left   . BREAST EXCISIONAL BIOPSY Right   . BREAST LUMPECTOMY WITH RADIOACTIVE SEED LOCALIZATION Right 04/27/2019   Procedure: RIGHT BREAST LUMPECTOMY WITH RADIOACTIVE SEED LOCALIZATION;  Surgeon: Jovita Kussmaul, MD;  Location: [facility];  Service:  General;  Laterality: Right;  . RECTAL SURGERY     x 3  . REPLACEMENT TOTAL KNEE BILATERAL    . SYNOVECTOMY Left 07/21/2015   Procedure: LEFT KNEE SYNOVECTOMY with POLY EXCHANGE;  Surgeon: Vickey Huger, MD;  Location: [facility];  Service: Orthopedics;  Laterality: Left;  . TOTAL KNEE REVISION Right 07/05/2016   Procedure: TOTAL KNEE REVISION WITH SCAR DEBRIDEMENT/PATELLA REVISION WITH POLY EXCHANGE;  Surgeon: Vickey Huger, MD;  Location: [facility];  Service: Orthopedics;  Laterality: Right;     OB  History   No obstetric history on file.     Family History  Problem Relation Age of Onset  . Cancer Mother   . Aneurysm Father     Social History   Tobacco Use  . Smoking status: Never Smoker  . Smokeless tobacco: Never Used  Substance Use Topics  . Alcohol use: Yes    Comment: WINE OCC  . Drug use: No    Home Medications Prior to Admission medications   Medication Sig Start Date End Date Taking? Authorizing Provider  Alpha-D-Galactosidase [facility]) TABS Take by mouth.    [provider]  amLODipine-benazepril (LOTREL) 5-40 MG capsule Take 1 capsule by mouth daily.    [provider]  aspirin EC 81 MG tablet Take 1 tablet (81 mg total) by mouth daily. 06/02/17   Dorothy Spark, MD  Calcium Carbonate-Vitamin D (CALCIUM 600+D) 600-400 MG-UNIT tablet Take 1 tablet by mouth 3 (three) times a week.     [provider]  Coenzyme Q10 (CO Q 10 PO) Take 200 mg by mouth every other day.     [provider]  ezetimibe (ZETIA) 10 MG tablet Take 1 tablet (10 mg total) by mouth daily. 08/06/19   Dorothy Spark, MD  fexofenadine (ALLEGRA) 180 MG tablet Take 180 mg by mouth daily as needed for allergies or rhinitis.    [provider]  Glucosamine-Chondroit-Vit C-Mn (GLUCOSAMINE 1500 COMPLEX PO) Take 1 tablet by mouth every other day.    [provider]  hydrochlorothiazide (HYDRODIURIL) 25 MG tablet Take 25 mg by mouth daily.    [provider]  metFORMIN (GLUCOPHAGE) 500 MG tablet Take by mouth daily with breakfast.    [provider]  mometasone (NASONEX) 50 MCG/ACT nasal spray Place 2 sprays into the nose 3 (three) times a week.     [provider]  Multiple Vitamin (MULTIVITAMIN WITH MINERALS) TABS tablet Take 1 tablet by mouth 3 (three) times a week.     [provider]  Omega-3 Fatty Acids (OMEGA 3 PO) Take 1 capsule by mouth 3 (three) times a week.     [provider]  polyethylene  glycol (MIRALAX / GLYCOLAX) 17 g packet Take 17 g by mouth every other day.     [provider]  Probiotic Product (ALIGN) 4 MG CAPS Take 4 mg by mouth daily as needed. supplement    [provider]  psyllium (HYDROCIL/METAMUCIL) 95 % PACK Take 1 packet by mouth every other day.     [provider]  rosuvastatin (CRESTOR) 20 MG tablet Take 1 tablet (20 mg total) by mouth daily. 08/06/19   Dorothy Spark, MD  traMADol (ULTRAM) 50 MG tablet Take 1-2 tablets (50-100 mg total) by mouth every 6 (six) hours. 07/06/16   Donia Ast, PA  TURMERIC PO Take 500 mg by mouth every other day.    [provider]  vitamin E 400 UNIT capsule Take  400 Units by mouth every other day.    [provider]    Allergies    No known allergies  Review of Systems   Review of Systems  Constitutional: Positive for fatigue. Negative for chills and fever.  HENT: Negative for congestion and rhinorrhea.   Eyes: Negative for redness and visual disturbance.  Respiratory: Negative for shortness of breath and wheezing.   Cardiovascular: Negative for chest pain and palpitations.  Gastrointestinal: Negative for nausea and vomiting.  Genitourinary: Negative for dysuria and urgency.  Musculoskeletal: Negative for arthralgias and myalgias.  Skin: Negative for pallor and wound.  Neurological: Negative for dizziness and headaches.    Physical Exam Updated Vital Signs BP 121/77   Pulse 62   Temp 98 F (36.7 C) (Oral)   Resp 20   Ht 5\' 4"  (1.626 m)   Wt 78.9 kg   SpO2 97%   BMI 29.87 kg/m   Physical Exam Vitals and nursing note reviewed.  Constitutional:      General: She is not in acute distress.    Appearance: She is well-developed. She is not diaphoretic.  HENT:     Head: Normocephalic and atraumatic.  Eyes:     Pupils: Pupils are equal, round, and reactive to light.  Cardiovascular:     Rate and Rhythm: Normal rate. Rhythm irregular.     Heart sounds:  No murmur. No friction rub. No gallop.   Pulmonary:     Effort: Pulmonary effort is normal.     Breath sounds: No wheezing or rales.  Abdominal:     General: There is no distension.     Palpations: Abdomen is soft.     Tenderness: There is no abdominal tenderness.  Musculoskeletal:        General: No tenderness.     Cervical back: Normal range of motion and neck supple.  Skin:    General: Skin is warm and dry.  Neurological:     Mental Status: She is alert and oriented to person, place, and time.  Psychiatric:        Behavior: Behavior normal.     ED Results / Procedures / Treatments   Labs (all labs ordered are listed, but only abnormal results are displayed) Labs Reviewed  BASIC METABOLIC PANEL - Abnormal; Notable for the following components:      Result Value   Potassium 3.3 (*)    Glucose, Bld 140 (*)    BUN 25 (*)    All other components within normal limits  CBC WITH DIFFERENTIAL/PLATELET  MAGNESIUM  TROPONIN I (HIGH SENSITIVITY)    EKG EKG Interpretation  Date/Time:  Wednesday December 19 2019 18:09:56 EST Ventricular Rate:  67 PR Interval:    QRS Duration: 110 QT Interval:  406 QTC Calculation: 429 R Axis:   -37 Text Interpretation: Sinus rhythm Multiform ventricular premature complexes Left axis deviation Low voltage, precordial leads Abnormal R-wave progression, late transition Borderline repolarization abnormality Otherwise no significant change Confirmed by Deno Etienne [phone]) on 12/19/2019 6:23:32 PM   Radiology DG Chest 2 View  Result Date: 12/19/2019 CLINICAL DATA:  79 year old female with palpitations. EXAM: CHEST - 2 VIEW COMPARISON:  Cardiac CTA 04/07/2016. Chest radiographs 07/15/2015 and earlier. FINDINGS: Lung volumes and mediastinal contours are stable since 2016 and within normal limits. Visualized tracheal air column is within normal limits. Both lungs appear stable in clear. No pneumothorax or pleural effusion. No acute osseous abnormality  identified. Negative visible bowel gas pattern. IMPRESSION: Stable since 2016 and  negative. No acute cardiopulmonary abnormality. Electronically Signed   By: Genevie Ann M.D.   On: 12/19/2019 20:07    Procedures Procedures (including critical care time)  Medications Ordered in ED Medications  potassium chloride SA (KLOR-CON) CR tablet 40 mEq (40 mEq Oral Given 12/19/19 1935)  magnesium oxide (MAG-OX) tablet 800 mg (800 mg Oral Given 12/19/19 1936)    ED Course  I have reviewed the triage vital signs and the nursing notes.  Pertinent labs & imaging results that were available during my care of the patient were reviewed by me and considered in my medical decision making (see chart for details).    MDM Rules/Calculators/A&P                      79 yo F with a chief complaint of palpitations.  EMS EKG consistent with frequent PVCs.  Are monitor consistent with the same.  Does not appear to be very symptomatic with it but has been endorsing some fatigue over the past few days.  She otherwise is well-appearing and nontoxic.  We will give a bolus of IV fluids check electrolytes troponin chest x-ray reassess.  Patient is feeling better on reassessment.  Much less frequent PVCs on the monitor.  Her potassium was very mildly low at 3.3.  Her magnesium was normal though lower than 2 so she was supplemented with both potassium and magnesium.  At this point will discharge the patient home have her follow-up with a cardiologist in the office.  8:59 PM:  I have discussed the diagnosis/risks/treatment options with the patient and believe the pt to be eligible for discharge home to follow-up with PCP, Cards. We also discussed returning to the ED immediately if new or worsening sx occur. We discussed the sx which are most concerning (e.g., sudden worsening pain, fever, inability to tolerate by mouth) that necessitate immediate return. Medications administered to the patient during their visit and any new  prescriptions provided to the patient are listed below.  Medications given during this visit Medications  potassium chloride SA (KLOR-CON) CR tablet 40 mEq (40 mEq Oral Given 12/19/19 1935)  magnesium oxide (MAG-OX) tablet 800 mg (800 mg Oral Given 12/19/19 1936)     The patient appears reasonably screen and/or stabilized for discharge and I doubt any other medical condition or other [facility] requiring further screening, evaluation, or treatment in the ED at this time prior to discharge.   Final Clinical Impression(s) / ED Diagnoses Final diagnoses:  Frequent PVCs    Rx / DC Orders ED Discharge Orders    None       Deno Etienne, DO 12/19/19 2059

## 2019-12-19 NOTE — Discharge Instructions (Signed)
Try to eat food high in potassium with each meal for the next couple days.  Please discuss with your cardiologist.

## 2019-12-19 NOTE — Telephone Encounter (Signed)
STAT if HR is under 50 or over 120 (normal HR is 60-100 beats per minute)  1) What is your heart rate? Fluctuated between 40-120  2) Do you have a log of your heart rate readings (document readings)? no  3) Do you have any other symptoms? Unbalanced and sluggish feeling   Pt was seen by Dr. Lawerance Cruel today. At the time of the visit Dr. Harrington Challenger noted an irregular HR that fluctuated between 40-120 bpm. He noted some Bradycardia and noticed the EKG does not show conjunctional rhythm which was his cause for concern.  Nurse working with Dr. Lawerance Cruel sent the office an updated EKG   Nurse working with Dr. Harrington Challenger called and wanted to see if the pt could be seen by Dr. Meda Coffee or one of the APPs within the next week. Dr. Alan Ripper Nurse also provided [place] with an additional phone number to reach the patient Nurse asked [place] to contact the patient to follow up. Pt was not present in the office when Dr. Alan Ripper nurse placed a call to our office

## 2019-12-19 NOTE — ED Triage Notes (Signed)
Pt BIB [facility] from home after being sent here by her PCP. Per patient she has no cardiac history but has been feeling weak for the past two weeks. She went to her PCP today for a follow up about her A1c and they found her to be bradycardic. Cardiology was questioning if patient was in afib or not so requested she come to the ED. Pt arrives alert and oriented x4. Pt denying any pain or shortness of breath. Pt found to be in bigeminy for EMS. Pt in a sinus rhythm with PVC's upon ED arrival.

## 2019-12-20 ENCOUNTER — Telehealth: Payer: Self-pay | Admitting: Cardiology

## 2019-12-20 DIAGNOSIS — E876 Hypokalemia: Secondary | ICD-10-CM

## 2019-12-20 NOTE — Telephone Encounter (Signed)
Please start 10 mEq daily and repeat BMP in 2 weeks

## 2019-12-20 NOTE — Telephone Encounter (Signed)
  Pt went to the ED last night, she's back home and would like to discuss her medications with Dr. Meda Coffee or her nurse if there's any changes she need to do.  Please call

## 2019-12-20 NOTE — Telephone Encounter (Signed)
Dr. Meda Coffee, this pt went to the ER yesterday, as advised by our office and her PCP Dr. Harrington Challenger.  Pt had frequent PVCs and low K level of 3.3. Pt was given KDUR in the hospital as well as given fluid bolus and other meds, but was discharged with nothing to take at home.  Pts discharge note stated to follow-up with PCP and Cards.  Scheduled her to come into the office for post-ER follow-up for 3/23 at 0915 with Richardson Dopp PA-C.   Pt wants to know if you can review her ER note and is there anything else you recommend she do or take, until her OV with Scott on 3/23? Pt wanted to thank our office for all the assistance late yesterday afternoon, for she was very scared what she was experiencing was new for her.  Informed the pt that I will route this message to Dr Meda Coffee to review her ER/discharge note from yesterday, and advise on any additional recommendations, until she see's Scott on 3/23.  Informed the pt that I will follow-up with her shortly thereafter.  Pt verbalized understanding and agrees with this plan.

## 2019-12-21 MED ORDER — POTASSIUM CHLORIDE ER 10 MEQ PO TBCR: 10.0000 meq | EXTENDED_RELEASE_TABLET | Freq: Every day | ORAL | 2 refills | Status: DC

## 2019-12-21 NOTE — Telephone Encounter (Signed)
Spoke with the pt and informed her that per Dr. Meda Coffee, she recommends that she start taking KDUR 10 mEq po daily and have a repeat BMET in 2 weeks.  Confirmed the pharmacy of choice with the pt. Scheduled the pt to come in, around 2 week mark, same day as she see's Richardson Dopp PA-C for hospital follow-up, to have her repeat BMET done on 01/01/20. Advised the pt that she should see Richardson Dopp PA-C first on 01/01/20 at 18, then go to the lab thereafter to have her BMET drawn, just incase Nicki Reaper wants to add on more labs after that visit.  Pt education provided on how to correctly take [facility].  Pt verbalized understanding and agrees with this plan.

## 2019-12-27 DIAGNOSIS — M48061 Spinal stenosis, lumbar region without neurogenic claudication: Secondary | ICD-10-CM | POA: Diagnosis not present

## 2019-12-31 NOTE — Progress Notes (Signed)
Cardiology Office Note  Date: 01/01/2020   ID: Andrea Farmer, DOB 11-14-1940, MRN [id number]  PCP:  Lawerance Cruel, MD  Cardiologist:  Ena Dawley, MD Electrophysiologist:  None   Chief Complaint: Palpitations, CAD, HTN, HLD  History of Present Illness: Andrea Farmer is a 79 y.o. female with prior medical history of HLD, HTN, palpitations.  Last office visit with Dr. Meda Coffee August 06, 2019.  At that time she stated every once in a while she felt a few palpitations but no other associated symptoms.  Patient had a previous Holter monitor 03/12/2016 that showed 700 PVCs in 24 hours, sinus bradycardia to sinus rhythm  In 2017she underwent a coronary CT d/t equivocal stress test showing CAC of 631 which was 89th percentile for age and sex matched control.  Normal coronary arteries, right dominance, mild diffuse CAD in the RCA and mild to moderate CAD in LAD.  At that time aggressive medical management was recommended.  She was started on Crestor and tolerating well.  Recent visit with PCP.  At the time of visit with her primary care provider he noted an irregular heart rate that fluctuated between 40 and 120 bpm.  He requested the patient go the the emergency room.  Patient had complained of feeling sluggish, tired, and very unbalanced on her feet.  She also complained of some shortness of breath and feeling lightheaded, "nervous on her stomach and shaking all over"  EMS was called and patient presented to the emergency department on 12/19/2019 complaining of palpitations and generalized fatigue.  EKG in the emergency room showed a sinus rhythm with multiform PVCs, left axis deviation, low voltage in the precordial leads, abnormal R wave progression, late transition borderline repolarization abnormality.  Heart rate was 67.  Patient was noted to have a potassium of 3.3.  She was treated with potassium repletion and magnesium 1.7 with repletion along with IV fluids. Trop I x 1 = 9. She was  discharged with final diagnosis of frequent PVCs.  Dr. Meda Coffee ordered patient to take potassium 10 mEq daily and a BMP in 2 weeks.  Patient states she is feeling well today.  She has had no further episodes of feeling sluggish tired or unbalanced on her feet.  She denies any shortness of breath or feeling lightheaded.  She does state she has occasional sensation of tightness in her stomach area and feels slightly shaky. She does have diabetes and takes Metformin.  She denies any progressive anginal or exertional symptoms, palpitations or arrhythmias, presyncopal or syncopal episodes, orthopnea, PND, claudication-like symptoms, DVT or PE-like symptoms, or lower extremity edema. Denies any symptoms that were similar to her presentation to recent ER visit on December 19, 2019.  Past Medical History:  Diagnosis Date  . Acid reflux   . Back pain    arthritis  . Cataract    immature but doesn't know which eye  . Diabetes mellitus without complication ([place])   . High cholesterol    takes Crestor daily  . History of colon polyps    benign  . History of migraine   . Hypertension    takes Lotrel and HCTZ dailydaily  . Irritable bowel syndrome 03/11/2016  . Joint pain   . Joint swelling   . Osteoarthritis of knee 03/11/2016  . Pneumonia    30 plus yrs ago    Past Surgical History:  Procedure Laterality Date  . ABDOMINAL HYSTERECTOMY    . BREAST BIOPSY Right 05/14/2015  .  BREAST EXCISIONAL BIOPSY Left   . BREAST EXCISIONAL BIOPSY Right   . BREAST LUMPECTOMY WITH RADIOACTIVE SEED LOCALIZATION Right 04/27/2019   Procedure: RIGHT BREAST LUMPECTOMY WITH RADIOACTIVE SEED LOCALIZATION;  Surgeon: Jovita Kussmaul, MD;  Location: [facility];  Service: General;  Laterality: Right;  . RECTAL SURGERY     x 3  . REPLACEMENT TOTAL KNEE BILATERAL    . SYNOVECTOMY Left 07/21/2015   Procedure: LEFT KNEE SYNOVECTOMY with POLY EXCHANGE;  Surgeon: Vickey Huger, MD;  Location: [facility];  Service: Orthopedics;   Laterality: Left;  . TOTAL KNEE REVISION Right 07/05/2016   Procedure: TOTAL KNEE REVISION WITH SCAR DEBRIDEMENT/PATELLA REVISION WITH POLY EXCHANGE;  Surgeon: Vickey Huger, MD;  Location: [facility];  Service: Orthopedics;  Laterality: Right;    Current Outpatient Medications  Medication Sig Dispense Refill  . amLODipine-benazepril (LOTREL) 5-40 MG capsule Take 1 capsule by mouth daily.    Marland Kitchen aspirin (ASPIRIN EC) 81 MG EC tablet Take 81 mg by mouth every other day. Swallow whole.    . Calcium Carbonate-Vitamin D (CALCIUM 600+D) 600-400 MG-UNIT tablet Take 1 tablet by mouth 3 (three) times a week.     . Coenzyme Q10 (CO Q 10 PO) Take 200 mg by mouth every other day.     . ezetimibe (ZETIA) 10 MG tablet Take 1 tablet (10 mg total) by mouth daily. 90 tablet 2  . fexofenadine (ALLEGRA) 180 MG tablet Take 180 mg by mouth daily as needed for allergies or rhinitis.    . Glucosamine-Chondroit-Vit C-Mn (GLUCOSAMINE 1500 COMPLEX PO) Take 1 tablet by mouth every other day.    . hydrochlorothiazide (HYDRODIURIL) 25 MG tablet Take 25 mg by mouth daily.    . metFORMIN (GLUCOPHAGE) 500 MG tablet Take by mouth daily with breakfast.    . mometasone (NASONEX) 50 MCG/ACT nasal spray Place 2 sprays into the nose 3 (three) times a week.     . Multiple Vitamin (MULTIVITAMIN WITH MINERALS) TABS tablet Take 1 tablet by mouth 3 (three) times a week.     . Omega-3 Fatty Acids (OMEGA 3 PO) Take 1 capsule by mouth 3 (three) times a week.     . polyethylene glycol (MIRALAX / GLYCOLAX) 17 g packet Take 17 g by mouth daily.     . potassium chloride (KLOR-CON) 10 MEQ tablet Take 1 tablet (10 mEq total) by mouth daily. 30 tablet 2  . Probiotic Product (ALIGN) 4 MG CAPS Take 4 mg by mouth daily. supplement    . psyllium (HYDROCIL/METAMUCIL) 95 % PACK Take 1 packet by mouth daily.     . rosuvastatin (CRESTOR) 20 MG tablet Take 1 tablet (20 mg total) by mouth daily. 90 tablet 2  . traMADol (ULTRAM) 50 MG tablet Take 50 mg by mouth  every 6 (six) hours as needed for moderate pain.    . TURMERIC PO Take 500 mg by mouth every other day.    . vitamin E 400 UNIT capsule Take 400 Units by mouth every other day.     No current facility-administered medications for this visit.   Allergies:  No known allergies   Social History: The patient  reports that she has never smoked. She has never used smokeless tobacco. She reports current alcohol use. She reports that she does not use drugs.   Family History: The patient's family history includes Aneurysm in her father; Cancer in her mother.   ROS:  Please see the history of present illness. Otherwise, complete review  of systems is positive for none.  All other systems are reviewed and negative.   Physical Exam: VS:  BP 132/60   Pulse 67   Ht 5\' 4"  (1.626 m)   Wt 174 lb 1.9 oz (79 kg)   SpO2 97%   BMI 29.89 kg/m , BMI Body mass index is 29.89 kg/m.  Wt Readings from Last 3 Encounters:  01/01/20 174 lb 1.9 oz (79 kg)  12/19/19 174 lb (78.9 kg)  08/06/19 176 lb (79.8 kg)    General: Patient appears comfortable at rest. Neck: Supple, no elevated JVP or carotid bruits, no thyromegaly. Lungs: Clear to auscultation, nonlabored breathing at rest. Cardiac: Regular rate and rhythm, no S3 or significant systolic murmur, no pericardial rub. Extremities: No pitting edema, distal pulses 2+. Skin: Warm and dry. Musculoskeletal: No kyphosis. Neuropsychiatric: Alert and oriented x3, affect grossly appropriate.  ECG:  An ECG dated 01/01/2020 was personally reviewed today and demonstrated:  Sinus rhythm rate of 67 with multiform PVCs, no acute ST or T wave changes, no voltage criteria for LVH  Recent Labwork: 12/19/2019: BUN 25; Creatinine, Ser 0.71; Hemoglobin 13.4; Magnesium 1.7; Platelets 326; Potassium 3.3; Sodium 139     Component Value Date/Time   CHOL 186 06/08/2018 1014   CHOL 164 05/20/2016 1127   TRIG 83 06/08/2018 1014   TRIG 88 05/20/2016 1127   HDL 58 06/08/2018 1014     HDL 55 05/20/2016 1127   CHOLHDL 3.2 06/08/2018 1014   CHOLHDL 3.0 05/20/2016 1127   LDLCALC 111 (H) 06/08/2018 1014   LDLCALC 91 05/20/2016 1127    Other Studies Reviewed Today:  Coronary CT 04/07/2016 performed due to equivocal stress test.  Aorta: Normal caliber. No dissection. Mild calcified plaque in the sino-tubular junction and in the aortic arch. Aortic Valve:  Trileaflet.  Normal thickness.  No calcifications. Coronary Arteries:  Normal coronary origin.  Right dominance. Right coronary artery is a large caliber dominant vessel that gives rise to PLA and two PDAs. There are diffuse mild, predominantly calcified plaques in the proximal, mid and distal RCA with max stenosis 25-50%. Left main is a very short vessel that bifurcates shortly after origin into LAC and LCX arteries. There is mild calcified plaque with associated stenosis 25-50% stenosis. LAD is a large caliber vessel that gives rise to three diagonal branches and wraps around the apex. Proximal LAD has a long diffuse predominantly calcified plaque with maximum stenosis 25-50%. Mid LAD has a focal mild calcified plaque with stenosis 25-50%. Distal LADhas a short segment of intramyocardial bridging with moderate stenosis, afterwards there is no significant stenosis. The first diagonal artery is a very small vessel with moderate non-calcified in its ostium. The second diagonal artery has a mild calcified ostial plaque with associated stenosis 25-50% stenosis. The third diagonal artery has no obvious plaque. LCX artery is a medium size non-dominant artery that gives rise to one obtuse marginal branch. There is minimal plaque. IMPRESSION: 1. Coronary calcium score of 681. This was 62 percentile for age and sex matched control. 2. Normal coronary origin.  Right dominance. 3. 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  An aggressive medical management is recommended.   Nuclear stress  test 03/31/2016 Study Highlights   Nuclear stress EF: 69%.  There was no ST segment deviation noted during stress.  This is a low risk study.  The left ventricular ejection fraction is hyperdynamic (>65%).   Small area of mild  apical ischemia No infarction EF 69%     Holter monitor results 03/19/2016 Predominant Rhythm:Sinus Rhythm with Periods of Sinus Bradycardia, Sinus Arrhythmia, Intermittent 1st AV Block, Can not Exclude Intermittent Junctional/Accelerated Junctional Rhythm(P waves hard to discern at times). Technician Comments:N-N Delays due to Sinus Arrhythmia and possible Compensatory Pauses, Brief Atrial Run, some SVEs counted due to Arrhythmia some vs Arrhythmia, Intermittent IntraVentricular Conduction Delay.    Carotid artery duplex study 06/20/2018 Indication for study non-palpable left carotid pulse. IMPRESSION: Less than 50% stenosis in the right and left internal carotid arteries.  Assessment and Plan:  1. PVC (premature ventricular contraction)   2. Essential (primary) hypertension   3. CAD in native artery   4. Hypokalemia   5. Hypomagnesemia   6. Mixed hyperlipidemia    1. PVC (premature ventricular contraction) Recent visit to ED 12/19/2019 for palpitations, sob, lightheaded, sluggishness, tired, and unbalanced on her feet with nervous feeling in her stomach.  EKG in the emergency room showed a sinus rhythm with multiform PVCs, left axis deviation, low voltage in the precordial leads, abnormal R wave progression, late transition borderline repolarization abnormality.  Heart rate was 67.  Today's EKG shows PVCs multiform. Get Zio patch 3-day to assess PVC burden.   2. Essential (primary) hypertension Blood pressure 132/60 today.  Continue amlodipine/benazepril 5/40 mg daily.  Continue HCTZ 25 mg daily.  3. CAD Coronary CT 04/07/2016 Coronary calcium score of 681. Diffuse mild CAD in the RCA and mild to moderate CAD in the LAD performed d/t equivocal tress  test.  She denies any progressive anginal or exertional symptoms.  She has a recent diagnosis of diabetes which may accelerate progression of coronary artery disease.  Instructed her to follow-up with Dr. Harrington Challenger who manages her diabetes. Her recent hemoglobin A1c was 6.1%.  She is on Metformin.  Continue aspirin 81 mg  4. Hyperlipidemia Fasting lipid profile on 06/13/2019 from PCP showed total cholesterol 169, triglycerides 81, HDL 59, calculated LDL 94. Not at goal.  May need to increase Crestor dose.  On Crestor 20 mg daily and Zetia 10 mg daily.  I have ordered a fasting lipid profile and LFT on the morning of her follow-up visit with Dr. Meda Coffee.  5. Hypokalemia / Hypomagnesmia K was 3.3 and Mag 1.7 at recent ED visit.  Patient has magnesium and potassium repletion and emergency room.  She was given 10 mEq of potassium to take per day by Dr. Meda Coffee after discharge.  Repeat BMET and Mag today.   Medication Adjustments/Labs and Tests Ordered: Current medicines are reviewed at length with the patient today.  Concerns regarding medicines are outlined above.   Disposition: Follow-up with Dr. Meda Coffee or APP 6 to 8 weeks  Signed, Levell July, NP 01/01/2020 9:26 AM    [place]

## 2020-01-01 ENCOUNTER — Encounter: Payer: Self-pay | Admitting: *Deleted

## 2020-01-01 ENCOUNTER — Other Ambulatory Visit: Payer: PPO

## 2020-01-01 ENCOUNTER — Ambulatory Visit: Payer: PPO | Admitting: Family Medicine

## 2020-01-01 ENCOUNTER — Encounter: Payer: Self-pay | Admitting: Physician Assistant

## 2020-01-01 ENCOUNTER — Other Ambulatory Visit: Payer: Self-pay

## 2020-01-01 VITALS — BP 132/60 | HR 67 | Ht 64.0 in | Wt 174.1 lb

## 2020-01-01 DIAGNOSIS — I493 Ventricular premature depolarization: Secondary | ICD-10-CM

## 2020-01-01 DIAGNOSIS — I2510 Atherosclerotic heart disease of native coronary artery without angina pectoris: Secondary | ICD-10-CM

## 2020-01-01 DIAGNOSIS — E876 Hypokalemia: Secondary | ICD-10-CM

## 2020-01-01 DIAGNOSIS — I10 Essential (primary) hypertension: Secondary | ICD-10-CM | POA: Diagnosis not present

## 2020-01-01 DIAGNOSIS — E782 Mixed hyperlipidemia: Secondary | ICD-10-CM | POA: Diagnosis not present

## 2020-01-01 LAB — BASIC METABOLIC PANEL
BUN/Creatinine Ratio: 26 (ref 12–28)
BUN: 20 mg/dL (ref 8–27)
CO2: 24 mmol/L (ref 20–29)
Calcium: 10.9 mg/dL — ABNORMAL HIGH (ref 8.7–10.3)
Chloride: 98 mmol/L (ref 96–106)
Creatinine, Ser: 0.76 mg/dL (ref 0.57–1.00)
GFR calc Af Amer: 88 mL/min/{1.73_m2} (ref >59)
GFR calc non Af Amer: 76 mL/min/{1.73_m2} (ref >59)
Glucose: 124 mg/dL — ABNORMAL HIGH (ref 65–99)
Potassium: 4.5 mmol/L (ref 3.5–5.2)
Sodium: 139 mmol/L (ref 134–144)

## 2020-01-01 LAB — MAGNESIUM: Magnesium: 2.1 mg/dL (ref 1.6–2.3)

## 2020-01-01 NOTE — Patient Instructions (Addendum)
Medication Instructions:   Your physician recommends that you continue on your current medications as directed. Please refer to the Current Medication list given to you today.  *If you need a refill on your cardiac medications before your next appointment, please call your pharmacy*  Lab Work:  You will have labs drawn today: BMET/Magnesium  If you have labs (blood work) drawn today and your tests are completely normal, you will receive your results only by: Marland Kitchen MyChart Message (if you have MyChart) OR . A paper copy in the mail If you have any lab test that is abnormal or we need to change your treatment, we will call you to review the results.  Testing/Procedures:  A zio monitor was ordered today. It will remain on for 3 days. You will then return monitor and event diary in provided box. It takes 1-2 weeks for report to be downloaded and returned to [place]. We will call you with the results. If monitor falls off or has orange flashing light, please call Zio for further instructions.   Follow-Up: At [facility], you and your health needs are our priority.  As part of our continuing mission to provide you with exceptional heart care, we have created designated Provider Care Teams.  These Care Teams include your primary Cardiologist (physician) and Advanced Practice Providers (APPs -  Physician Assistants and Nurse Practitioners) who all work together to provide you with the care you need, when you need it.  We recommend signing up for the patient portal called "MyChart".  Sign up information is provided on this After Visit Summary.  MyChart is used to connect with patients for Virtual Visits (Telemedicine).  Patients are able to view lab/test results, encounter notes, upcoming appointments, etc.  Non-urgent messages can be sent to your provider as well.   To learn more about what you can do with MyChart, go to [email or web address].    Your next appointment:    In 6-8 weeks with Dr.  Meda Coffee

## 2020-01-01 NOTE — Progress Notes (Signed)
Patient ID: Andrea Farmer, female   DOB: 1941-01-22, 79 y.o.   MRN: [id number] Patient enrolled for [employer] to mail a 14 day ZIO XT long term holter monitor to her home.  Instructions sent to patient via My Chart message and will also be included in the monitor kit.

## 2020-01-05 ENCOUNTER — Ambulatory Visit (INDEPENDENT_AMBULATORY_CARE_PROVIDER_SITE_OTHER): Payer: PPO

## 2020-01-05 DIAGNOSIS — I493 Ventricular premature depolarization: Secondary | ICD-10-CM | POA: Diagnosis not present

## 2020-01-09 DIAGNOSIS — Z7984 Long term (current) use of oral hypoglycemic drugs: Secondary | ICD-10-CM | POA: Diagnosis not present

## 2020-01-09 DIAGNOSIS — E1169 Type 2 diabetes mellitus with other specified complication: Secondary | ICD-10-CM | POA: Diagnosis not present

## 2020-01-09 DIAGNOSIS — E7800 Pure hypercholesterolemia, unspecified: Secondary | ICD-10-CM | POA: Diagnosis not present

## 2020-01-09 DIAGNOSIS — M8580 Other specified disorders of bone density and structure, unspecified site: Secondary | ICD-10-CM | POA: Diagnosis not present

## 2020-01-09 DIAGNOSIS — I10 Essential (primary) hypertension: Secondary | ICD-10-CM | POA: Diagnosis not present

## 2020-01-09 DIAGNOSIS — M1990 Unspecified osteoarthritis, unspecified site: Secondary | ICD-10-CM | POA: Diagnosis not present

## 2020-01-09 DIAGNOSIS — M179 Osteoarthritis of knee, unspecified: Secondary | ICD-10-CM | POA: Diagnosis not present

## 2020-01-30 DIAGNOSIS — M47812 Spondylosis without myelopathy or radiculopathy, cervical region: Secondary | ICD-10-CM | POA: Diagnosis not present

## 2020-01-30 DIAGNOSIS — Z6830 Body mass index (BMI) 30.0-30.9, adult: Secondary | ICD-10-CM | POA: Diagnosis not present

## 2020-01-30 DIAGNOSIS — R030 Elevated blood-pressure reading, without diagnosis of hypertension: Secondary | ICD-10-CM | POA: Diagnosis not present

## 2020-01-30 DIAGNOSIS — M48061 Spinal stenosis, lumbar region without neurogenic claudication: Secondary | ICD-10-CM | POA: Diagnosis not present

## 2020-01-30 DIAGNOSIS — I493 Ventricular premature depolarization: Secondary | ICD-10-CM | POA: Diagnosis not present

## 2020-01-31 ENCOUNTER — Telehealth: Payer: Self-pay | Admitting: Cardiology

## 2020-01-31 DIAGNOSIS — R000 Tachycardia, unspecified: Secondary | ICD-10-CM

## 2020-01-31 DIAGNOSIS — R9431 Abnormal electrocardiogram [ECG] [EKG]: Secondary | ICD-10-CM

## 2020-01-31 DIAGNOSIS — I4729 Other ventricular tachycardia: Secondary | ICD-10-CM

## 2020-01-31 DIAGNOSIS — I493 Ventricular premature depolarization: Secondary | ICD-10-CM

## 2020-01-31 DIAGNOSIS — R001 Bradycardia, unspecified: Secondary | ICD-10-CM

## 2020-01-31 DIAGNOSIS — I472 Ventricular tachycardia: Secondary | ICD-10-CM

## 2020-01-31 NOTE — Telephone Encounter (Signed)
   Pt is returning call from [facility] she said its regarding her heart monitor result  Please call

## 2020-01-31 NOTE — Telephone Encounter (Signed)
-----   Message from Dorothy Spark, MD sent at 01/31/2020  1:04 PM EDT ----- Sinus bradycardia to sinus tachycardia. 4 very short runs of nsVT, the longest lastin 4 beats.   Repeat echocardiogram, if normal LVEF, continue the same medical management.

## 2020-01-31 NOTE — Telephone Encounter (Signed)
Informed he pt of her monitor results and recommendations per Dr. Meda Coffee.  Informed the pt that per Dr. Meda Coffee, she would like to order for her to have an echo done, to further evaluate monitor findings. Informed the pt that if her echo reveals normal LVEF, then we will continue her current medical management.  Informed the pt that I will place the referral in the system, and send our Echo Scheduler a message to call her back and arrange this appt. Pt verbalized understanding and agrees with this plan.

## 2020-02-06 ENCOUNTER — Other Ambulatory Visit: Payer: Self-pay

## 2020-02-06 ENCOUNTER — Ambulatory Visit
Admission: RE | Admit: 2020-02-06 | Discharge: 2020-02-06 | Disposition: A | Discharge status: Home / Self Care | Payer: PPO | Source: Ambulatory Visit | Attending: Family Medicine | Admitting: Family Medicine

## 2020-02-06 DIAGNOSIS — Z1231 Encounter for screening mammogram for malignant neoplasm of breast: Secondary | ICD-10-CM

## 2020-02-07 ENCOUNTER — Other Ambulatory Visit: Payer: Self-pay | Admitting: Family Medicine

## 2020-02-07 DIAGNOSIS — Z1231 Encounter for screening mammogram for malignant neoplasm of breast: Secondary | ICD-10-CM

## 2020-02-07 DIAGNOSIS — R928 Other abnormal and inconclusive findings on diagnostic imaging of breast: Secondary | ICD-10-CM

## 2020-02-15 ENCOUNTER — Ambulatory Visit
Admission: RE | Admit: 2020-02-15 | Discharge: 2020-02-15 | Disposition: A | Discharge status: Home / Self Care | Payer: PPO | Source: Ambulatory Visit | Attending: Family Medicine | Admitting: Family Medicine

## 2020-02-15 ENCOUNTER — Other Ambulatory Visit: Payer: Self-pay

## 2020-02-15 ENCOUNTER — Other Ambulatory Visit: Payer: Self-pay | Admitting: Family Medicine

## 2020-02-15 DIAGNOSIS — N6313 Unspecified lump in the right breast, lower outer quadrant: Secondary | ICD-10-CM | POA: Diagnosis not present

## 2020-02-15 DIAGNOSIS — R928 Other abnormal and inconclusive findings on diagnostic imaging of breast: Secondary | ICD-10-CM

## 2020-02-20 ENCOUNTER — Ambulatory Visit (HOSPITAL_COMMUNITY): Payer: PPO | Attending: Internal Medicine

## 2020-02-20 ENCOUNTER — Other Ambulatory Visit: Payer: Self-pay

## 2020-02-20 DIAGNOSIS — R000 Tachycardia, unspecified: Secondary | ICD-10-CM | POA: Diagnosis not present

## 2020-02-20 DIAGNOSIS — I472 Ventricular tachycardia: Secondary | ICD-10-CM

## 2020-02-20 DIAGNOSIS — I4729 Other ventricular tachycardia: Secondary | ICD-10-CM

## 2020-02-20 DIAGNOSIS — R001 Bradycardia, unspecified: Secondary | ICD-10-CM | POA: Insufficient documentation

## 2020-02-20 DIAGNOSIS — I493 Ventricular premature depolarization: Secondary | ICD-10-CM | POA: Insufficient documentation

## 2020-02-20 DIAGNOSIS — R9431 Abnormal electrocardiogram [ECG] [EKG]: Secondary | ICD-10-CM | POA: Insufficient documentation

## 2020-02-21 ENCOUNTER — Ambulatory Visit: Payer: PPO | Admitting: Cardiology

## 2020-02-21 ENCOUNTER — Encounter: Payer: Self-pay | Admitting: Cardiology

## 2020-02-21 VITALS — BP 124/70 | HR 71 | Ht 64.0 in | Wt 170.8 lb

## 2020-02-21 DIAGNOSIS — E876 Hypokalemia: Secondary | ICD-10-CM | POA: Diagnosis not present

## 2020-02-21 DIAGNOSIS — R9389 Abnormal findings on diagnostic imaging of other specified body structures: Secondary | ICD-10-CM

## 2020-02-21 DIAGNOSIS — E7800 Pure hypercholesterolemia, unspecified: Secondary | ICD-10-CM

## 2020-02-21 DIAGNOSIS — I2510 Atherosclerotic heart disease of native coronary artery without angina pectoris: Secondary | ICD-10-CM | POA: Diagnosis not present

## 2020-02-21 DIAGNOSIS — E782 Mixed hyperlipidemia: Secondary | ICD-10-CM | POA: Diagnosis not present

## 2020-02-21 LAB — COMPREHENSIVE METABOLIC PANEL
ALT: 13 IU/L (ref 0–32)
AST: 19 IU/L (ref 0–40)
Albumin/Globulin Ratio: 2.2 (ref 1.2–2.2)
Albumin: 4.6 g/dL (ref 3.7–4.7)
Alkaline Phosphatase: 72 IU/L (ref 39–117)
BUN/Creatinine Ratio: 29 — ABNORMAL HIGH (ref 12–28)
BUN: 19 mg/dL (ref 8–27)
Bilirubin Total: 0.3 mg/dL (ref 0.0–1.2)
CO2: 25 mmol/L (ref 20–29)
Calcium: 9.9 mg/dL (ref 8.7–10.3)
Chloride: 100 mmol/L (ref 96–106)
Creatinine, Ser: 0.66 mg/dL (ref 0.57–1.00)
GFR calc Af Amer: 99 mL/min/{1.73_m2} (ref >59)
GFR calc non Af Amer: 85 mL/min/{1.73_m2} (ref >59)
Globulin, Total: 2.1 g/dL (ref 1.5–4.5)
Glucose: 110 mg/dL — ABNORMAL HIGH (ref 65–99)
Potassium: 4 mmol/L (ref 3.5–5.2)
Sodium: 141 mmol/L (ref 134–144)
Total Protein: 6.7 g/dL (ref 6.0–8.5)

## 2020-02-21 LAB — LIPID PANEL
Chol/HDL Ratio: 2.3 ratio (ref 0.0–4.4)
Cholesterol, Total: 142 mg/dL (ref 100–199)
HDL: 62 mg/dL (ref >39)
LDL Chol Calc (NIH): 67 mg/dL (ref 0–99)
Triglycerides: 62 mg/dL (ref 0–149)
VLDL Cholesterol Cal: 13 mg/dL (ref 5–40)

## 2020-02-21 LAB — TSH: TSH: 2.4 u[IU]/mL (ref 0.450–4.500)

## 2020-02-21 LAB — MAGNESIUM: Magnesium: 2 mg/dL (ref 1.6–2.3)

## 2020-02-21 NOTE — Patient Instructions (Signed)
Medication Instructions:   Your physician recommends that you continue on your current medications as directed. Please refer to the Current Medication list given to you today.  *If you need a refill on your cardiac medications before your next appointment, please call your pharmacy*   Lab Work:  TODAY--CMET, MAGNESIUM LEVEL, TSH, AND LIPIDS  If you have labs (blood work) drawn today and your tests are completely normal, you will receive your results only by: Marland Kitchen MyChart Message (if you have MyChart) OR . A paper copy in the mail If you have any lab test that is abnormal or we need to change your treatment, we will call you to review the results.   Testing/Procedures:  Your cardiac CT will be scheduled at one of the below locations:   [facility] [address] [place], [place] [address] [phone]  If scheduled at [facility], please arrive at the [facility] main entrance of [facility] 30 minutes prior to test start time. Proceed to the [facility] Radiology Department (first floor) to check-in and test prep.  Please follow these instructions carefully (unless otherwise directed):  On the Night Before the Test: . Be sure to Drink plenty of water. . Do not consume any caffeinated/decaffeinated beverages or chocolate 12 hours prior to your test. . Do not take any antihistamines 12 hours prior to your test. . If you take Metformin do not take 24 hours prior to test.  On the Day of the Test: . Drink plenty of water. Do not drink any water within one hour of the test. . Do not eat any food 4 hours prior to the test. . You may take your regular medications prior to the test. . . HOLD Hydrochlorothiazide morning of the test. . FEMALES- please wear underwire-free bra if available      After the Test: . Drink plenty of water. . After receiving IV contrast, you may experience a mild flushed feeling. This is normal. . On occasion, you may experience  a mild rash up to 24 hours after the test. This is not dangerous. If this occurs, you can take Benadryl 25 mg and increase your fluid intake. . If you experience trouble breathing, this can be serious. If it is severe call 911 IMMEDIATELY. If it is mild, please call our office. . If you take any of these medications: Glipizide/Metformin, Avandament, Glucavance, please do not take 48 hours after completing test unless otherwise instructed.  Once we have confirmed authorization from your insurance company, we will call you to set up a date and time for your test.   For non-scheduling related questions, please contact the cardiac imaging nurse navigator should you have any questions/concerns: Marchia Bond, Cardiac Imaging Nurse Navigator Burley Saver, Interim Cardiac Imaging Nurse [place] [facility] Direct Office Dial: [phone]   For scheduling needs, including cancellations and rescheduling, please call [phone].    Follow-Up: At [facility], you and your health needs are our priority.  As part of our continuing mission to provide you with exceptional heart care, we have created designated Provider Care Teams.  These Care Teams include your primary Cardiologist (physician) and Advanced Practice Providers (APPs -  Physician Assistants and Nurse Practitioners) who all work together to provide you with the care you need, when you need it.  We recommend signing up for the patient portal called "MyChart".  Sign up information is provided on this After Visit Summary.  MyChart is used  to connect with patients for Virtual Visits (Telemedicine).  Patients are able to view lab/test results, encounter notes, upcoming appointments, etc.  Non-urgent messages can be sent to your provider as well.   To learn more about what you can do with MyChart, go to [email or web address].    Your next appointment:   12 month(s)  The format for your next appointment:   In  Person  Provider:   Ena Dawley, MD

## 2020-02-21 NOTE — Progress Notes (Signed)
Cardiology Office Note    Date:  02/21/2020   ID:  Andrea Farmer, DOB 07-22-1941, MRN [id number]  PCP:  Lawerance Cruel, MD  Cardiologist: Ena Dawley, MD  Referring physician: Melinda Crutch, MD  Chief complaint: PVCs, shortness of breath.  History of Present Illness:  Andrea Farmer is a 79 y.o. female with prior medical history of hyperlipidemia, hypertension, who recently underwent screening colonoscopy and observed that she has an extra beats on her telemetry. She states that every once a while she feels a few second lasting palpitations, however there no associated with any chest pain, dizziness or syncope. The patient states that she has always been very active but lately has noticed that certain activities take her longer or she has to take a break in between. This is a very slow gradual change rather than a abrupt change she still able to do all activities of daily living without any significant problems.  She doesn't have a family history of sudden cardiac death or coronary artery disease. She has a very distant history of smoking. 2018 the patient underwent 24 hour Holter monitoring that showed 700 PVCs in 24 hours, sinus bradycardia to sinus rhythm. LVEF 60-65% on echocardiogram. She underwent coronary CT angiography that showed coronary calcium score of 681. This was 68 percentile for age and sex matched control. Normal coronary origin.  Right dominance. 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 An aggressive medical management is recommended. She was started on Crestor 20 mg daily that she is tolerating well. She exercises on a bicycle 3 times a week. She is difficulty with walking as an exercise as she had bilateral knee replacement. She denies any chest pain or shortness of breath, she eats very healthy Mediterranean style diet. No further palpitations.  02/21/2020 -the patient has been doing well, she recently saw Dr. Harrington Challenger will identified frequent  polymorphic PVCs on her EKG.  She afterwards underwent Zio patch monitoring that showed frequent PVCs as well is very short runs of SVC but also NSVT with longest lasting 4 beats.  Recheck her echocardiogram that shows LVEF 60 to 65% and no significant valvular abnormalities. She denies any palpitation dizziness or syncope.  She admits that she is more tired lately however has no episodes of chest pain.  She has been compliant with her medications and continues to exercise.   Past Medical History:  Diagnosis Date  . Acid reflux   . Back pain    arthritis  . Cataract    immature but doesn't know which eye  . Diabetes mellitus without complication ([place])   . High cholesterol    takes Crestor daily  . History of colon polyps    benign  . History of migraine   . Hypertension    takes Lotrel and HCTZ dailydaily  . Irritable bowel syndrome 03/11/2016  . Joint pain   . Joint swelling   . Osteoarthritis of knee 03/11/2016  . Pneumonia    30 plus yrs ago    Past Surgical History:  Procedure Laterality Date  . ABDOMINAL HYSTERECTOMY    . BREAST BIOPSY Right 05/14/2015  . BREAST EXCISIONAL BIOPSY Left   . BREAST EXCISIONAL BIOPSY Right   . BREAST LUMPECTOMY WITH RADIOACTIVE SEED LOCALIZATION Right 04/27/2019   Procedure: RIGHT BREAST LUMPECTOMY WITH RADIOACTIVE SEED LOCALIZATION;  Surgeon: Jovita Kussmaul, MD;  Location: [facility];  Service: General;  Laterality: Right;  .  RECTAL SURGERY     x 3  . REPLACEMENT TOTAL KNEE BILATERAL    . SYNOVECTOMY Left 07/21/2015   Procedure: LEFT KNEE SYNOVECTOMY with POLY EXCHANGE;  Surgeon: Vickey Huger, MD;  Location: [facility];  Service: Orthopedics;  Laterality: Left;  . TOTAL KNEE REVISION Right 07/05/2016   Procedure: TOTAL KNEE REVISION WITH SCAR DEBRIDEMENT/PATELLA REVISION WITH POLY EXCHANGE;  Surgeon: Vickey Huger, MD;  Location: [facility];  Service: Orthopedics;  Laterality: Right;    Current Medications: Outpatient Medications Prior to  Visit  Medication Sig Dispense Refill  . amLODipine-benazepril (LOTREL) 5-40 MG capsule Take 1 capsule by mouth daily.    Marland Kitchen aspirin (ASPIRIN EC) 81 MG EC tablet Take 81 mg by mouth every other day. Swallow whole.    . Calcium Carbonate-Vitamin D (CALCIUM 600+D) 600-400 MG-UNIT tablet Take 1 tablet by mouth 3 (three) times a week.     . Coenzyme Q10 (CO Q 10 PO) Take 200 mg by mouth every other day.     . ezetimibe (ZETIA) 10 MG tablet Take 1 tablet (10 mg total) by mouth daily. 90 tablet 2  . fexofenadine (ALLEGRA) 180 MG tablet Take 180 mg by mouth daily as needed for allergies or rhinitis.    . Glucosamine-Chondroit-Vit C-Mn (GLUCOSAMINE 1500 COMPLEX PO) Take 1 tablet by mouth every other day.    . hydrochlorothiazide (HYDRODIURIL) 25 MG tablet Take 25 mg by mouth daily.    . metFORMIN (GLUCOPHAGE) 500 MG tablet Take by mouth daily with breakfast.    . mometasone (NASONEX) 50 MCG/ACT nasal spray Place 2 sprays into the nose 3 (three) times a week.     . Multiple Vitamin (MULTIVITAMIN WITH MINERALS) TABS tablet Take 1 tablet by mouth 3 (three) times a week.     . Omega-3 Fatty Acids (OMEGA 3 PO) Take 1 capsule by mouth 3 (three) times a week.     . potassium chloride (KLOR-CON) 10 MEQ tablet Take 1 tablet (10 mEq total) by mouth daily. 30 tablet 2  . Probiotic Product (ALIGN) 4 MG CAPS Take 4 mg by mouth daily. supplement    . psyllium (HYDROCIL/METAMUCIL) 95 % PACK Take 1 packet by mouth daily.     . rosuvastatin (CRESTOR) 20 MG tablet Take 1 tablet (20 mg total) by mouth daily. 90 tablet 2  . traMADol (ULTRAM) 50 MG tablet Take 50 mg by mouth every 6 (six) hours as needed for moderate pain.    . TURMERIC PO Take 500 mg by mouth every other day.    . vitamin E 400 UNIT capsule Take 400 Units by mouth every other day.    . polyethylene glycol (MIRALAX / GLYCOLAX) 17 g packet Take 17 g by mouth daily.      No facility-administered medications prior to visit.     Allergies:   No known  allergies   Social History   Socioeconomic History  . Marital status: Divorced    Spouse name: Not on file  . Number of children: Not on file  . Years of education: Not on file  . Highest education level: Not on file  Occupational History  . Not on file  Tobacco Use  . Smoking status: Never Smoker  . Smokeless tobacco: Never Used  Substance and Sexual Activity  . Alcohol use: Yes    Comment: WINE OCC  . Drug use: No  . Sexual activity: Not on file  Other Topics Concern  . Not on file  Social History Narrative  .  Not on file   Social Determinants of Health   Financial Resource Strain:   . Difficulty of Paying Living Expenses:   Food Insecurity:   . Worried About Charity fundraiser in the Last Year:   . Arboriculturist in the Last Year:   Transportation Needs:   . Film/video editor (Medical):   Marland Kitchen Lack of Transportation (Non-Medical):   Physical Activity:   . Days of Exercise per Week:   . Minutes of Exercise per Session:   Stress:   . Feeling of Stress :   Social Connections:   . Frequency of Communication with Friends and Family:   . Frequency of Social Gatherings with Friends and Family:   . Attends Religious Services:   . Active Member of Clubs or Organizations:   . Attends Archivist Meetings:   Marland Kitchen Marital Status:      Family History:  The patient's family history includes Aneurysm in her father; Cancer in her mother.   ROS:   Please see the history of present illness.    ROS All other systems reviewed and are negative.   PHYSICAL EXAM:   VS:  BP 124/70   Pulse 71   Ht '5\' 4"'$  (1.626 m)   Wt 170 lb 12.8 oz (77.5 kg)   SpO2 99%   BMI 29.32 kg/m    GEN: Well nourished, well developed, in no acute distress  HEENT: normal  Neck: no JVD, carotid bruits, or masses Cardiac: RRR; no murmurs, rubs, or gallops,no edema  Respiratory:  clear to auscultation bilaterally, normal work of breathing GI: soft, nontender, nondistended, + BS MS: no  deformity or atrophy  Skin: warm and dry, no rash Neuro:  Alert and Oriented x 3, Strength and sensation are intact Psych: euthymic mood, full affect  Wt Readings from Last 3 Encounters:  02/21/20 170 lb 12.8 oz (77.5 kg)  01/01/20 174 lb 1.9 oz (79 kg)  12/19/19 174 lb (78.9 kg)     Studies/Labs Reviewed:   EKG:  EKG is ordered today.  And personally reviewed. Normal sinus rhythm otherwise normal EKG when compared to last one PVCs are no longer present.  Recent Labs: 12/19/2019: Hemoglobin 13.4; Platelets 326 01/01/2020: BUN 20; Creatinine, Ser 0.76; Magnesium 2.1; Potassium 4.5; Sodium 139   Lipid Panel    Component Value Date/Time   CHOL 186 06/08/2018 1014   CHOL 164 05/20/2016 1127   TRIG 83 06/08/2018 1014   TRIG 88 05/20/2016 1127   HDL 58 06/08/2018 1014   HDL 55 05/20/2016 1127   CHOLHDL 3.2 06/08/2018 1014   CHOLHDL 3.0 05/20/2016 1127   LDLCALC 111 (H) 06/08/2018 1014   LDLCALC 91 05/20/2016 1127   Additional studies/ records that were reviewed today include:  As per history of present illness.  TTE: 03/12/16 - Left ventricle: The cavity size was normal. Systolic function was   normal. The estimated ejection fraction was in the range of 60%   to 65%. Wall motion was normal; there were no regional wall   motion abnormalities. Doppler parameters are consistent with   abnormal left ventricular relaxation (grade 1 diastolic   dysfunction). Doppler parameters are consistent with elevated   ventricular end-diastolic filling pressure. - Aortic valve: Trileaflet; normal thickness leaflets. There was no   regurgitation. - Aortic root: The aortic root was normal in size. - Mitral valve: Calcified annulus. Mildly thickened leaflets .   There was mild regurgitation. - Left atrium: The atrium was  normal in size. - Right ventricle: Systolic function was normal. - Tricuspid valve: There was mild regurgitation. - Pulmonary arteries: Systolic pressure was mildly increased.  PA   peak pressure: 40 mm Hg (S). - Inferior vena cava: The vessel was normal in size. - Pericardium, extracardiac: There was no pericardial effusion.  EKG performed today 02/21/2020 was personally reviewed which shows normal sinus rhythm, 3 PVCs, 2 of those of similar morphology in 1 different.  ASSESSMENT:    1. CAD in native artery   2. Hypomagnesemia   3. Hypokalemia   4. Mixed hyperlipidemia   5. Coronary artery disease involving native coronary artery of native heart without angina pectoris   6. Pure hypercholesterolemia   7. Abnormal computed tomography angiography (CTA)    PLAN:  In order of problems listed above:  1. CAD, moderate nonobstructive on coronary CTA in 2018, she now has worsening if number of PVCs now polymorphic and short runs of NSVT.  We will obtain labs including electrolytes including magnesium as well as TSH and lipids.  We will arrange for follow-up coronary CTA as it has been 4 years, will evaluate any progression of her disease.  2.  Hypertension -well-controlled on current regimen.   3.  Hyperlipidemia, on Crestor 20 mg daily, fish oil and Zetia.  We will obtain her lipids today.  4.  Impaired glucose tolerance -on Metformin, followed by Dr. Harrington Challenger, most recent hemoglobin A1c 5.9%.  Medication Adjustments/Labs and Tests Ordered: Current medicines are reviewed at length with the patient today.  Concerns regarding medicines are outlined above.  Medication changes, Labs and Tests ordered today are listed in the Patient Instructions below. Patient Instructions  Medication Instructions:   Your physician recommends that you continue on your current medications as directed. Please refer to the Current Medication list given to you today.  *If you need a refill on your cardiac medications before your next appointment, please call your pharmacy*   Lab Work:  TODAY--CMET, MAGNESIUM LEVEL, TSH, AND LIPIDS  If you have labs (blood work) drawn today and your  tests are completely normal, you will receive your results only by: Marland Kitchen MyChart Message (if you have MyChart) OR . A paper copy in the mail If you have any lab test that is abnormal or we need to change your treatment, we will call you to review the results.   Testing/Procedures:  Your cardiac CT will be scheduled at one of the below locations:   [facility] [address] [place], [place] [address] [phone]  If scheduled at [facility], please arrive at the [facility] main entrance of [facility] 30 minutes prior to test start time. Proceed to the [facility] Radiology Department (first floor) to check-in and test prep.  Please follow these instructions carefully (unless otherwise directed):  On the Night Before the Test: . Be sure to Drink plenty of water. . Do not consume any caffeinated/decaffeinated beverages or chocolate 12 hours prior to your test. . Do not take any antihistamines 12 hours prior to your test. . If you take Metformin do not take 24 hours prior to test.  On the Day of the Test: . Drink plenty of water. Do not drink any water within one hour of the test. . Do not eat any food 4 hours prior to the test. . You may take your regular medications prior to the test. . . HOLD Hydrochlorothiazide morning of the test. . FEMALES- please wear underwire-free bra  if available      After the Test: . Drink plenty of water. . After receiving IV contrast, you may experience a mild flushed feeling. This is normal. . On occasion, you may experience a mild rash up to 24 hours after the test. This is not dangerous. If this occurs, you can take Benadryl 25 mg and increase your fluid intake. . If you experience trouble breathing, this can be serious. If it is severe call 911 IMMEDIATELY. If it is mild, please call our office. . If you take any of these medications: Glipizide/Metformin, Avandament, Glucavance, please do not take 48 hours after  completing test unless otherwise instructed.  Once we have confirmed authorization from your insurance company, we will call you to set up a date and time for your test.   For non-scheduling related questions, please contact the cardiac imaging nurse navigator should you have any questions/concerns: Marchia Bond, Cardiac Imaging Nurse Navigator Burley Saver, Interim Cardiac Imaging Nurse [place] [facility] Direct Office Dial: [phone]   For scheduling needs, including cancellations and rescheduling, please call [phone].    Follow-Up: At [facility], you and your health needs are our priority.  As part of our continuing mission to provide you with exceptional heart care, we have created designated Provider Care Teams.  These Care Teams include your primary Cardiologist (physician) and Advanced Practice Providers (APPs -  Physician Assistants and Nurse Practitioners) who all work together to provide you with the care you need, when you need it.  We recommend signing up for the patient portal called "MyChart".  Sign up information is provided on this After Visit Summary.  MyChart is used to connect with patients for Virtual Visits (Telemedicine).  Patients are able to view lab/test results, encounter notes, upcoming appointments, etc.  Non-urgent messages can be sent to your provider as well.   To learn more about what you can do with MyChart, go to [email or web address].    Your next appointment:   12 month(s)  The format for your next appointment:   In Person  Provider:   Ena Dawley, MD        Signed, Ena Dawley, MD  02/21/2020 11:56 AM    [address] [place], [place],   [address] Phone: [phone]; Fax: [phone]

## 2020-02-24 DIAGNOSIS — S41112A Laceration without foreign body of left upper arm, initial encounter: Secondary | ICD-10-CM | POA: Diagnosis not present

## 2020-02-24 DIAGNOSIS — Z23 Encounter for immunization: Secondary | ICD-10-CM | POA: Diagnosis not present

## 2020-02-25 ENCOUNTER — Other Ambulatory Visit: Payer: Self-pay

## 2020-02-25 ENCOUNTER — Ambulatory Visit
Admission: RE | Admit: 2020-02-25 | Discharge: 2020-02-25 | Disposition: A | Discharge status: Home / Self Care | Payer: PPO | Source: Ambulatory Visit | Attending: Family Medicine | Admitting: Family Medicine

## 2020-02-25 DIAGNOSIS — R928 Other abnormal and inconclusive findings on diagnostic imaging of breast: Secondary | ICD-10-CM

## 2020-02-25 DIAGNOSIS — Z170 Estrogen receptor positive status [ER+]: Secondary | ICD-10-CM | POA: Diagnosis not present

## 2020-02-25 DIAGNOSIS — C50511 Malignant neoplasm of lower-outer quadrant of right female breast: Secondary | ICD-10-CM | POA: Diagnosis not present

## 2020-02-25 DIAGNOSIS — C50919 Malignant neoplasm of unspecified site of unspecified female breast: Secondary | ICD-10-CM

## 2020-02-25 DIAGNOSIS — N6313 Unspecified lump in the right breast, lower outer quadrant: Secondary | ICD-10-CM | POA: Diagnosis not present

## 2020-02-25 HISTORY — DX: Malignant neoplasm of unspecified site of unspecified female breast: C50.919

## 2020-02-27 DIAGNOSIS — Z86010 Personal history of colonic polyps: Secondary | ICD-10-CM | POA: Diagnosis not present

## 2020-02-27 DIAGNOSIS — K5902 Outlet dysfunction constipation: Secondary | ICD-10-CM | POA: Diagnosis not present

## 2020-02-29 ENCOUNTER — Telehealth: Payer: Self-pay | Admitting: Cardiology

## 2020-02-29 NOTE — Telephone Encounter (Signed)
Pt is calling to let us know that she will be seeing Dr. Autumn Messing with [facility] next week, for consult of having her breast cancer removed.  Pt states she does have cancer in her right breast, and will need surgical intervention to have this removed..  Pt is asking if there will need to be any cardiac clearance needed prior to when he schedules her surgery.  Informed the pt that yes, she will need to get cleared from a cardiac/medication perspective, prior to her surgery.  Informed the pt this is per our protocol. Advised the pt that she could go ahead and get ahead of things, and call Dr. Eddie Dibbles Toth's office now, and have them go ahead and fax [place] a cardiac clearance form to [phone] ATTN: Dr. Meda Coffee.  Informed the pt that way, we can go ahead and start working on clearing her, by sending the note to our Pre-op Providers.  Advised her to go ahead and call them now, so that we can place the surgical clearance note in her chart and send to our pre-op pool. Pt verbalized understanding and agrees with this plan. Will route this message to Dr. Meda Coffee, as a general FYI.

## 2020-02-29 NOTE — Telephone Encounter (Signed)
New Message  Pt called and stated that she got an appt with a surgeon and wants to go more in depth with a nurse.   Please call

## 2020-03-03 ENCOUNTER — Other Ambulatory Visit (HOSPITAL_COMMUNITY): Payer: Self-pay | Admitting: *Deleted

## 2020-03-03 ENCOUNTER — Telehealth (HOSPITAL_COMMUNITY): Payer: Self-pay | Admitting: *Deleted

## 2020-03-03 MED ORDER — METOPROLOL TARTRATE 50 MG PO TABS
ORAL_TABLET | ORAL | 0 refills | Status: DC
Start: 2020-03-03 — End: 2020-03-13

## 2020-03-03 NOTE — Telephone Encounter (Signed)
Reaching out to patient to offer assistance regarding upcoming cardiac imaging study; pt verbalizes understanding of appt date/time, parking situation and where to check in, pre-test NPO status and medications ordered, and verified current allergies; name and call back number provided for further questions should they arise  Burley Saver RN [place] [facility] [phone] office [phone] cell  Pt to pick up '50mg'$  metoprolol tartrate today (5/24) and take it two hours prior to scan.

## 2020-03-04 ENCOUNTER — Other Ambulatory Visit: Payer: Self-pay

## 2020-03-04 ENCOUNTER — Ambulatory Visit: Payer: Self-pay | Admitting: General Surgery

## 2020-03-04 ENCOUNTER — Telehealth: Payer: Self-pay

## 2020-03-04 ENCOUNTER — Encounter (HOSPITAL_COMMUNITY): Payer: Self-pay

## 2020-03-04 ENCOUNTER — Ambulatory Visit (HOSPITAL_COMMUNITY)
Admission: RE | Admit: 2020-03-04 | Discharge: 2020-03-04 | Disposition: A | Discharge status: Home / Self Care | Payer: PPO | Source: Ambulatory Visit | Attending: Cardiology | Admitting: Cardiology

## 2020-03-04 DIAGNOSIS — I2510 Atherosclerotic heart disease of native coronary artery without angina pectoris: Secondary | ICD-10-CM | POA: Insufficient documentation

## 2020-03-04 DIAGNOSIS — E876 Hypokalemia: Secondary | ICD-10-CM | POA: Insufficient documentation

## 2020-03-04 DIAGNOSIS — E782 Mixed hyperlipidemia: Secondary | ICD-10-CM | POA: Insufficient documentation

## 2020-03-04 DIAGNOSIS — E7800 Pure hypercholesterolemia, unspecified: Secondary | ICD-10-CM | POA: Insufficient documentation

## 2020-03-04 DIAGNOSIS — Z170 Estrogen receptor positive status [ER+]: Secondary | ICD-10-CM | POA: Diagnosis not present

## 2020-03-04 DIAGNOSIS — I700 Atherosclerosis of aorta: Secondary | ICD-10-CM | POA: Insufficient documentation

## 2020-03-04 DIAGNOSIS — R9389 Abnormal findings on diagnostic imaging of other specified body structures: Secondary | ICD-10-CM

## 2020-03-04 DIAGNOSIS — C50511 Malignant neoplasm of lower-outer quadrant of right female breast: Secondary | ICD-10-CM

## 2020-03-04 MED ORDER — IOHEXOL 350 MG/ML SOLN
80.0000 mL | Freq: Once | INTRAVENOUS | Status: AC | PRN
  Administered 2020-03-04: 80 mL via INTRAVENOUS

## 2020-03-04 MED ORDER — NITROGLYCERIN 0.4 MG SL SUBL: 0.8000 mg | SUBLINGUAL_TABLET | Freq: Once | SUBLINGUAL | Status: AC

## 2020-03-04 MED ORDER — DILTIAZEM HCL 25 MG/5ML IV SOLN
INTRAVENOUS | Status: AC
  Administered 2020-03-04: 5 mg via INTRAVENOUS
  Filled 2020-03-04: qty 5

## 2020-03-04 MED ORDER — NITROGLYCERIN 0.4 MG SL SUBL
SUBLINGUAL_TABLET | SUBLINGUAL | Status: AC
  Administered 2020-03-04: 0.4 mg via SUBLINGUAL
  Filled 2020-03-04: qty 1

## 2020-03-04 MED ORDER — DILTIAZEM HCL 25 MG/5ML IV SOLN
5.0000 mg | Freq: Once | INTRAVENOUS | Status: AC
  Filled 2020-03-04: qty 5

## 2020-03-04 NOTE — Telephone Encounter (Signed)
   [facility] Pre-operative Risk Assessment    HEARTCARE STAFF: - Please ensure there is not already an duplicate clearance open for this procedure. - Under Visit Info/Reason for Call, type in Other and utilize the format Clearance MM/DD/YY or Clearance TBD. Do not use dashes or single digits. - If request is for dental extraction, please clarify the # of teeth to be extracted.  Request for surgical clearance:  1. What type of surgery is being performed? LUMPECTOMY   2. When is this surgery scheduled? TBD   3. What type of clearance is required (medical clearance vs. Pharmacy clearance to hold med vs. Both)? MEDICAL  4. Are there any medications that need to be held prior to surgery and how long? ASA   5. Practice name and name of physician performing surgery? [facility]; DR. PAUL TOTH   6. What is the office phone number? [phone]   7.   What is the office fax number? [phone], ATTN: Ventura Sellers, CMA  8.   Anesthesia type (None, local, MAC, general) ? GENERAL   Andrea Farmer 03/04/2020, 3:20 PM  _________________________________________________________________   (provider comments below)

## 2020-03-05 ENCOUNTER — Telehealth: Payer: Self-pay | Admitting: Hematology and Oncology

## 2020-03-05 DIAGNOSIS — I2510 Atherosclerotic heart disease of native coronary artery without angina pectoris: Secondary | ICD-10-CM | POA: Diagnosis not present

## 2020-03-05 NOTE — Telephone Encounter (Signed)
Received a new pt referral from Dr. [name] for breast cancer. Andrea Farmer has been cld and schedule to see Dr. Lindi Adie on 6/2 at 1pm. Pt aware to arrive 15 minutes early.

## 2020-03-05 NOTE — Telephone Encounter (Signed)
   Primary Cardiologist: Ena Dawley, MD  Chart reviewed as part of pre-operative protocol coverage. Given past medical history and time since last visit, based on ACC/AHA guidelines, GWENDOLYNE COPPA would be at acceptable risk for the planned procedure without further cardiovascular testing.   She may hold her aspirin for 5 days prior to her procedure. Please resume as soon as hemostasis was achieved.  I will route this recommendation to the requesting party via Epic fax function and remove from pre-op pool.  Please call with questions.  Jossie Ng. Ishaq Maffei NP-C    03/05/2020, 12:26 PM [address] Office [phone] Fax [phone]

## 2020-03-05 NOTE — Telephone Encounter (Signed)
Yes that she can undergo colonoscopy and can hold aspirin for 5 days prior and restart as soon as possible afterwards.

## 2020-03-06 ENCOUNTER — Telehealth: Payer: Self-pay | Admitting: *Deleted

## 2020-03-06 DIAGNOSIS — E876 Hypokalemia: Secondary | ICD-10-CM

## 2020-03-06 MED ORDER — POTASSIUM CHLORIDE ER 10 MEQ PO TBCR: 10.0000 meq | EXTENDED_RELEASE_TABLET | Freq: Every day | ORAL | 2 refills | Status: DC

## 2020-03-06 MED ORDER — ROSUVASTATIN CALCIUM 40 MG PO TABS
40.0000 mg | ORAL_TABLET | Freq: Every day | ORAL | 2 refills | Status: DC
Start: 2020-03-06 — End: 2020-08-18

## 2020-03-06 NOTE — Telephone Encounter (Signed)
Spoke with the pt and informed her of her Coronary CT results and recommendations per Dr. Meda Coffee. Informed the pt that per Dr. Meda Coffee, she would like for her to increase her rosuvastatin to 40 mg po daily, if she can tolerate this.  Pt states she is tolerating rosuvastatin very well, and agrees to the increase.  Confirmed the pharmacy of choice with the pt.  Pt states she will also need a refill of her KDUR sent in for a 90 day supply as well.  Sent both scripts to her pharmacy.  Pt verbalized understanding and agrees with this plan.

## 2020-03-06 NOTE — Telephone Encounter (Signed)
-----   Message from Dorothy Spark, MD sent at 03/05/2020  7:30 PM EDT ----- She still moderate non-obstructive disease, but slightly worse, could she try rosuvastatin 40 mg po daily if she can tolerate it?

## 2020-03-07 ENCOUNTER — Other Ambulatory Visit: Payer: Self-pay | Admitting: General Surgery

## 2020-03-07 DIAGNOSIS — Z170 Estrogen receptor positive status [ER+]: Secondary | ICD-10-CM

## 2020-03-07 DIAGNOSIS — C50511 Malignant neoplasm of lower-outer quadrant of right female breast: Secondary | ICD-10-CM

## 2020-03-11 NOTE — Progress Notes (Signed)
[facility] CONSULT NOTE  Patient Care Team: Lawerance Cruel, MD as PCP - General (Family Medicine) Dorothy Spark, MD as PCP - Cardiology (Cardiology)  CHIEF COMPLAINTS/PURPOSE OF CONSULTATION:  Newly diagnosed breast cancer  HISTORY OF PRESENTING ILLNESS:  Andrea Farmer 79 y.o. female is here because of recent diagnosis of invasive ductal carcinoma of the right breast. Screening mammogram on 02/06/20 detected a right breast mass. Diagnostic mammogram and [place] on 02/15/20 showed a 0.4cm mass at the 6:30 position and no axillary adenopathy. Biopsy on 02/25/20 showed invasive ductal carcinoma, grade 3, HER-2 negative (1+), ER+ 95%, PR- 0%, KI67 40%. She presents to the clinic today for initial evaluation and discussion of treatment options.   I reviewed her records extensively and collaborated the history with the patient.  SUMMARY OF ONCOLOGIC HISTORY: Oncology History  Malignant neoplasm of lower-outer quadrant of right breast of female, estrogen receptor positive ([place])  02/15/2020 Initial Diagnosis   Screening mammogram detected right breast mass. 0.4cm mass at the 6:30 position and no axillary adenopathy. Biopsy: invasive ductal carcinoma, grade 3, HER-2 negative (1+), ER+ 95%, PR- 0%, KI67 40%.     MEDICAL HISTORY:  Past Medical History:  Diagnosis Date  . Acid reflux   . Back pain    arthritis  . Cataract    immature but doesn't know which eye  . Diabetes mellitus without complication ([place])   . High cholesterol    takes Crestor daily  . History of colon polyps    benign  . History of migraine   . Hypertension    takes Lotrel and HCTZ dailydaily  . Irritable bowel syndrome 03/11/2016  . Joint pain   . Joint swelling   . Osteoarthritis of knee 03/11/2016  . Pneumonia    30 plus yrs ago    SURGICAL HISTORY: Past Surgical History:  Procedure Laterality Date  . ABDOMINAL HYSTERECTOMY    . BREAST BIOPSY Right 05/14/2015  . BREAST EXCISIONAL BIOPSY Left     . BREAST EXCISIONAL BIOPSY Right   . BREAST LUMPECTOMY WITH RADIOACTIVE SEED LOCALIZATION Right 04/27/2019   Procedure: RIGHT BREAST LUMPECTOMY WITH RADIOACTIVE SEED LOCALIZATION;  Surgeon: Jovita Kussmaul, MD;  Location: [facility];  Service: General;  Laterality: Right;  . RECTAL SURGERY     x 3  . REPLACEMENT TOTAL KNEE BILATERAL    . SYNOVECTOMY Left 07/21/2015   Procedure: LEFT KNEE SYNOVECTOMY with POLY EXCHANGE;  Surgeon: Vickey Huger, MD;  Location: [facility];  Service: Orthopedics;  Laterality: Left;  . TOTAL KNEE REVISION Right 07/05/2016   Procedure: TOTAL KNEE REVISION WITH SCAR DEBRIDEMENT/PATELLA REVISION WITH POLY EXCHANGE;  Surgeon: Vickey Huger, MD;  Location: [facility];  Service: Orthopedics;  Laterality: Right;    SOCIAL HISTORY: Social History   Socioeconomic History  . Marital status: Divorced    Spouse name: Not on file  . Number of children: Not on file  . Years of education: Not on file  . Highest education level: Not on file  Occupational History  . Not on file  Tobacco Use  . Smoking status: Never Smoker  . Smokeless tobacco: Never Used  Substance and Sexual Activity  . Alcohol use: Yes    Comment: WINE OCC  . Drug use: No  . Sexual activity: Not on file  Other Topics Concern  . Not on file  Social History Narrative  . Not on file   Social Determinants of Health   Financial Resource Strain:   .  Difficulty of Paying Living Expenses:   Food Insecurity:   . Worried About Charity fundraiser in the Last Year:   . Arboriculturist in the Last Year:   Transportation Needs:   . Film/video editor (Medical):   Marland Kitchen Lack of Transportation (Non-Medical):   Physical Activity:   . Days of Exercise per Week:   . Minutes of Exercise per Session:   Stress:   . Feeling of Stress :   Social Connections:   . Frequency of Communication with Friends and Family:   . Frequency of Social Gatherings with Friends and Family:   . Attends Religious Services:    . Active Member of Clubs or Organizations:   . Attends Archivist Meetings:   Marland Kitchen Marital Status:   Intimate Partner Violence:   . Fear of Current or Ex-Partner:   . Emotionally Abused:   Marland Kitchen Physically Abused:   . Sexually Abused:     FAMILY HISTORY: Family History  Problem Relation Age of Onset  . Cancer Mother   . Aneurysm Father     ALLERGIES:  is allergic to no known allergies.  MEDICATIONS:  Current Outpatient Medications  Medication Sig Dispense Refill  . amLODipine-benazepril (LOTREL) 5-40 MG capsule Take 1 capsule by mouth daily.    Marland Kitchen aspirin (ASPIRIN EC) 81 MG EC tablet Take 81 mg by mouth daily. Swallow whole.     . Calcium Carbonate-Vitamin D (CALCIUM 600+D) 600-400 MG-UNIT tablet Take 1 tablet by mouth 3 (three) times a week.     . Coenzyme Q10 (CO Q 10 PO) Take 200 mg by mouth every other day.     . ezetimibe (ZETIA) 10 MG tablet Take 1 tablet (10 mg total) by mouth daily. 90 tablet 2  . fexofenadine (ALLEGRA) 180 MG tablet Take 180 mg by mouth daily as needed for allergies or rhinitis.    . Glucosamine-Chondroit-Vit C-Mn (GLUCOSAMINE 1500 COMPLEX PO) Take 1 tablet by mouth every other day.    . hydrochlorothiazide (HYDRODIURIL) 25 MG tablet Take 25 mg by mouth daily.    . metFORMIN (GLUCOPHAGE) 500 MG tablet Take by mouth daily with breakfast.    . metoprolol tartrate (LOPRESSOR) 50 MG tablet Take 1 tablet 2 hours before cardiac CT scan. 1 tablet 0  . mometasone (NASONEX) 50 MCG/ACT nasal spray Place 2 sprays into the nose 3 (three) times a week.     . Multiple Vitamin (MULTIVITAMIN WITH MINERALS) TABS tablet Take 1 tablet by mouth 3 (three) times a week.     . Omega-3 Fatty Acids (OMEGA 3 PO) Take 1 capsule by mouth 3 (three) times a week.     . potassium chloride (KLOR-CON) 10 MEQ tablet Take 1 tablet (10 mEq total) by mouth daily. 90 tablet 2  . Probiotic Product (ALIGN) 4 MG CAPS Take 4 mg by mouth daily. supplement    . psyllium (HYDROCIL/METAMUCIL) 95  % PACK Take 1 packet by mouth daily.     . rosuvastatin (CRESTOR) 40 MG tablet Take 1 tablet (40 mg total) by mouth daily. 90 tablet 2  . traMADol (ULTRAM) 50 MG tablet Take 50 mg by mouth every 6 (six) hours as needed for moderate pain.    . TURMERIC PO Take 500 mg by mouth every other day.    . vitamin E 400 UNIT capsule Take 400 Units by mouth every other day.     No current facility-administered medications for this visit.    REVIEW OF  SYSTEMS:   Constitutional: Denies fevers, chills or abnormal night sweats Eyes: Denies blurriness of vision, double vision or watery eyes Ears, nose, mouth, throat, and face: Denies mucositis or sore throat Respiratory: Denies cough, dyspnea or wheezes Cardiovascular: Denies palpitation, chest discomfort or lower extremity swelling Gastrointestinal:  Denies nausea, heartburn or change in bowel habits Skin: Denies abnormal skin rashes Lymphatics: Denies new lymphadenopathy or easy bruising Neurological:Denies numbness, tingling or new weaknesses Behavioral/Psych: Mood is stable, no new changes  Breast: Denies any palpable lumps or discharge All other systems were reviewed with the patient and are negative.  PHYSICAL EXAMINATION: ECOG PERFORMANCE STATUS: 1 - Symptomatic but completely ambulatory  Vitals:   03/12/20 1257  BP: (!) 148/74  Pulse: 68  Resp: 20  Temp: 98.5 F (36.9 C)  SpO2: 97%   Filed Weights   03/12/20 1257  Weight: 173 lb 12.8 oz (78.8 kg)    GENERAL:alert, no distress and comfortable SKIN: skin color, texture, turgor are normal, no rashes or significant lesions EYES: normal, conjunctiva are pink and non-injected, sclera clear OROPHARYNX:no exudate, no erythema and lips, buccal mucosa, and tongue normal  NECK: supple, thyroid normal size, non-tender, without nodularity LYMPH:  no palpable lymphadenopathy in the cervical, axillary or inguinal LUNGS: clear to auscultation and percussion with normal breathing effort HEART:  regular rate & rhythm and no murmurs and no lower extremity edema ABDOMEN:abdomen soft, non-tender and normal bowel sounds Musculoskeletal:no cyanosis of digits and no clubbing  PSYCH: alert & oriented x 3 with fluent speech NEURO: no focal motor/sensory deficits BREAST: No palpable nodules in breast. No palpable axillary or supraclavicular lymphadenopathy (exam performed in the presence of a chaperone)   LABORATORY DATA:  I have reviewed the data as listed Lab Results  Component Value Date   WBC 7.8 12/19/2019   HGB 13.4 12/19/2019   HCT 41.9 12/19/2019   MCV 94.2 12/19/2019   PLT 326 12/19/2019   Lab Results  Component Value Date   NA 141 02/21/2020   K 4.0 02/21/2020   CL 100 02/21/2020   CO2 25 02/21/2020    RADIOGRAPHIC STUDIES: I have personally reviewed the radiological reports and agreed with the findings in the report.  ASSESSMENT AND PLAN:  Malignant neoplasm of lower-outer quadrant of right breast of female, estrogen receptor positive ([place]) 02/15/2020:Screening mammogram detected right breast mass. 0.4cm mass at the 6:30 position and no axillary adenopathy. Biopsy: invasive ductal carcinoma, grade 3, HER-2 negative (1+), ER+ 95%, PR- 0%, KI67 40%. T1 a N0 stage Ia clinical stage  Pathology and radiology counseling:Discussed with the patient, the details of pathology including the type of breast cancer,the clinical staging, the significance of ER, PR and HER-2/neu receptors and the implications for treatment. After reviewing the pathology in detail, we proceeded to discuss the different treatment options between surgery, radiation, chemotherapy, antiestrogen therapies.  Recommendations: 1. Breast conserving surgery with sentinel lymph node biopsy followed by 2. Adjuvant radiation therapy followed by 4. Adjuvant antiestrogen therapy with anastrozole 1 mg daily x5 years  Patient has excellent performance status.  She and her husband travel a lot and stays very active and  busy.  Return to clinic after surgery to discuss the final pathology report.  All questions were answered. The patient knows to call the clinic with any problems, questions or concerns.   Rulon Eisenmenger, MD, MPH 03/12/2020    I, Molly Dorshimer, am acting as scribe for Nicholas Lose, MD.  I have reviewed the above documentation for accuracy  and completeness, and I agree with the above.

## 2020-03-12 ENCOUNTER — Inpatient Hospital Stay: Payer: PPO | Attending: Hematology and Oncology | Admitting: Hematology and Oncology

## 2020-03-12 ENCOUNTER — Other Ambulatory Visit: Payer: Self-pay

## 2020-03-12 DIAGNOSIS — C50511 Malignant neoplasm of lower-outer quadrant of right female breast: Secondary | ICD-10-CM | POA: Diagnosis not present

## 2020-03-12 DIAGNOSIS — Z96653 Presence of artificial knee joint, bilateral: Secondary | ICD-10-CM | POA: Diagnosis not present

## 2020-03-12 DIAGNOSIS — R200 Anesthesia of skin: Secondary | ICD-10-CM | POA: Diagnosis not present

## 2020-03-12 DIAGNOSIS — Z809 Family history of malignant neoplasm, unspecified: Secondary | ICD-10-CM | POA: Insufficient documentation

## 2020-03-12 DIAGNOSIS — Z90710 Acquired absence of both cervix and uterus: Secondary | ICD-10-CM | POA: Diagnosis not present

## 2020-03-12 DIAGNOSIS — M79603 Pain in arm, unspecified: Secondary | ICD-10-CM | POA: Insufficient documentation

## 2020-03-12 DIAGNOSIS — Z170 Estrogen receptor positive status [ER+]: Secondary | ICD-10-CM | POA: Diagnosis not present

## 2020-03-12 NOTE — Assessment & Plan Note (Signed)
02/15/2020:Screening mammogram detected right breast mass. 0.4cm mass at the 6:30 position and no axillary adenopathy. Biopsy: invasive ductal carcinoma, grade 3, HER-2 negative (1+), ER+ 95%, PR- 0%, KI67 40%. T1 a N0 stage Ia clinical stage  Pathology and radiology counseling:Discussed with the patient, the details of pathology including the type of breast cancer,the clinical staging, the significance of ER, PR and HER-2/neu receptors and the implications for treatment. After reviewing the pathology in detail, we proceeded to discuss the different treatment options between surgery, radiation, chemotherapy, antiestrogen therapies.  Recommendations: 1. Breast conserving surgery followed by 2. +/- Adjuvant radiation therapy followed by 4. Adjuvant antiestrogen therapy  Return to clinic after surgery to discuss the final pathology report.

## 2020-03-12 NOTE — Progress Notes (Signed)
Location of Breast Cancer: Central portion of right breast- LOQ  Did patient present with symptoms (if so, please note symptoms) or was this found on screening mammography?:  Screening mammogram found a 4 mm mass in the 6 o'clock position of the right breast.   Histology per Pathology Report: Right Breast 02/25/2020  Receptor Status: ER(+95%), PR (-0%), Her2-neu (-), Ki-67(40%)    Past/Anticipated interventions by surgeon, if any: Dr. [name] 03/04/2020  -The patient appears to have a 4 mm stage I cancer in the lower outer right breast with clinically negative nodes. -I have discussed with her in detail the options for treatment and at this point she favors breast conservation. -She is also a good candidate for sentinel node biopsy. -I will also refer her to medical and radiation oncology to discuss adjuvant therapy.   Past/Anticipated interventions by medical oncology, if any: Chemotherapy  Dr. Lindi Adie 03/12/2020 Recommendations: 1. Breast conserving surgery with sentinel lymph node biopsy followed by 2. Adjuvant radiation therapy followed by 4. Adjuvant antiestrogen therapy with anastrozole 1 mg daily x5 years   Lymphedema issues, if any: No   Pain issues, if any: No  SAFETY ISSUES:  Prior radiation? No  Pacemaker/ICD? No  Possible current pregnancy? Postmenopausal  Is the patient on methotrexate? 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 July 2020    Cori Razor, RN 03/12/2020,12:33 PM

## 2020-03-13 ENCOUNTER — Other Ambulatory Visit: Payer: Self-pay

## 2020-03-13 ENCOUNTER — Ambulatory Visit
Admission: RE | Admit: 2020-03-13 | Discharge: 2020-03-13 | Disposition: A | Discharge status: Home / Self Care | Payer: PPO | Source: Ambulatory Visit | Attending: Radiation Oncology | Admitting: Radiation Oncology

## 2020-03-13 ENCOUNTER — Other Ambulatory Visit: Payer: Self-pay | Admitting: Cardiology

## 2020-03-13 ENCOUNTER — Encounter: Payer: Self-pay | Admitting: Radiation Oncology

## 2020-03-13 ENCOUNTER — Encounter: Payer: Self-pay | Admitting: *Deleted

## 2020-03-13 ENCOUNTER — Encounter (HOSPITAL_BASED_OUTPATIENT_CLINIC_OR_DEPARTMENT_OTHER): Payer: Self-pay | Admitting: General Surgery

## 2020-03-13 VITALS — Ht 64.0 in | Wt 173.0 lb

## 2020-03-13 DIAGNOSIS — Z809 Family history of malignant neoplasm, unspecified: Secondary | ICD-10-CM | POA: Diagnosis not present

## 2020-03-13 DIAGNOSIS — E876 Hypokalemia: Secondary | ICD-10-CM

## 2020-03-13 DIAGNOSIS — C50511 Malignant neoplasm of lower-outer quadrant of right female breast: Secondary | ICD-10-CM

## 2020-03-13 DIAGNOSIS — Z170 Estrogen receptor positive status [ER+]: Secondary | ICD-10-CM | POA: Diagnosis not present

## 2020-03-13 NOTE — Progress Notes (Signed)
Radiation Oncology         ([phone]) [phone] ________________________________  Initial Outpatient Consultation - Conducted via telephone due to current COVID-19 concerns for limiting patient exposure  I spoke with the patient to conduct this consult visit via telephone to spare the patient unnecessary potential exposure in the healthcare setting during the current COVID-19 pandemic. The patient was notified in advance and was offered a [facility] meeting to allow for face to face communication but unfortunately reported that they did not have the appropriate resources/technology to support such a visit and instead preferred to proceed with a telephone consult.   Name: Andrea Farmer        MRN: [id number]  Date of Service: 03/13/2020 DOB: 1941/03/02  TD:SKAJ, Dwyane Luo, MD  Jovita Kussmaul, MD     REFERRING PHYSICIAN: [name], MD   DIAGNOSIS: The encounter diagnosis was Malignant neoplasm of lower-outer quadrant of right breast of female, estrogen receptor positive ([place]).   HISTORY OF PRESENT ILLNESS: Andrea Farmer is a 79 y.o. female with a new diagnosis of right breast cancer. The patient was noted to have a complex sclerosing lesion in the right breast for which she underwent lumpectomy in July 2020.  No invasive disease was identified.  She was being followed with routine screening mammography and a mass was noted in the breast at 630 o'clock.  Further diagnostic imaging revealed this at 4 x 4 x 4 mm, her axilla was negative for adenopathy.  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  Her Ki-67 was 40%.  She is seen today to discuss options of treatment.    PREVIOUS RADIATION THERAPY: No   PAST MEDICAL HISTORY:  Past Medical History:  Diagnosis Date  . Acid reflux   . Back pain    arthritis  . Cataract    immature but doesn't know which eye  . Diabetes mellitus without complication ([place])   .  High cholesterol    takes Crestor daily  . History of colon polyps    benign  . History of migraine   . Hypertension    takes Lotrel and HCTZ dailydaily  . Irritable bowel syndrome 03/11/2016  . Joint pain   . Joint swelling   . Osteoarthritis of knee 03/11/2016  . Pneumonia    30 plus yrs ago       PAST SURGICAL HISTORY: Past Surgical History:  Procedure Laterality Date  . ABDOMINAL HYSTERECTOMY    . BREAST BIOPSY Right 05/14/2015  . BREAST EXCISIONAL BIOPSY Left   . BREAST EXCISIONAL BIOPSY Right   . BREAST LUMPECTOMY WITH RADIOACTIVE SEED LOCALIZATION Right 04/27/2019   Procedure: RIGHT BREAST LUMPECTOMY WITH RADIOACTIVE SEED LOCALIZATION;  Surgeon: Jovita Kussmaul, MD;  Location: [facility];  Service: General;  Laterality: Right;  . RECTAL SURGERY     x 3  . REPLACEMENT TOTAL KNEE BILATERAL    . SYNOVECTOMY Left 07/21/2015   Procedure: LEFT KNEE SYNOVECTOMY with POLY EXCHANGE;  Surgeon: Vickey Huger, MD;  Location: [facility];  Service: Orthopedics;  Laterality: Left;  . TOTAL KNEE REVISION Right 07/05/2016   Procedure: TOTAL KNEE REVISION WITH SCAR DEBRIDEMENT/PATELLA REVISION WITH POLY EXCHANGE;  Surgeon: Vickey Huger, MD;  Location: [facility];  Service: Orthopedics;  Laterality: Right;     FAMILY HISTORY:  Family History  Problem Relation Age of Onset  . Cancer Mother   . Aneurysm  Father      SOCIAL HISTORY:  reports that she has never smoked. She has never used smokeless tobacco. She reports current alcohol use. She reports that she does not use drugs. The patient is married and lives in [place]. She is retired but is very active.   ALLERGIES: No known allergies   MEDICATIONS:  Current Outpatient Medications  Medication Sig Dispense Refill  . amLODipine-benazepril (LOTREL) 5-40 MG capsule Take 1 capsule by mouth daily.    Marland Kitchen aspirin (ASPIRIN EC) 81 MG EC tablet Take 81 mg by mouth daily. Swallow whole.     . Calcium Carbonate-Vitamin D (CALCIUM 600+D)  600-400 MG-UNIT tablet Take 1 tablet by mouth 3 (three) times a week.     . Coenzyme Q10 (CO Q 10 PO) Take 200 mg by mouth every other day.     . ezetimibe (ZETIA) 10 MG tablet Take 1 tablet (10 mg total) by mouth daily. 90 tablet 2  . fexofenadine (ALLEGRA) 180 MG tablet Take 180 mg by mouth daily as needed for allergies or rhinitis.    . Glucosamine-Chondroit-Vit C-Mn (GLUCOSAMINE 1500 COMPLEX PO) Take 1 tablet by mouth every other day.    . hydrochlorothiazide (HYDRODIURIL) 25 MG tablet Take 25 mg by mouth daily.    . metFORMIN (GLUCOPHAGE) 500 MG tablet Take by mouth daily with breakfast.    . metoprolol tartrate (LOPRESSOR) 50 MG tablet Take 1 tablet 2 hours before cardiac CT scan. 1 tablet 0  . mometasone (NASONEX) 50 MCG/ACT nasal spray Place 2 sprays into the nose 3 (three) times a week.     . Multiple Vitamin (MULTIVITAMIN WITH MINERALS) TABS tablet Take 1 tablet by mouth 3 (three) times a week.     . Omega-3 Fatty Acids (OMEGA 3 PO) Take 1 capsule by mouth 3 (three) times a week.     . potassium chloride (KLOR-CON) 10 MEQ tablet Take 1 tablet (10 mEq total) by mouth daily. 90 tablet 2  . Probiotic Product (ALIGN) 4 MG CAPS Take 4 mg by mouth daily. supplement    . psyllium (HYDROCIL/METAMUCIL) 95 % PACK Take 1 packet by mouth daily.     . rosuvastatin (CRESTOR) 40 MG tablet Take 1 tablet (40 mg total) by mouth daily. 90 tablet 2  . traMADol (ULTRAM) 50 MG tablet Take 50 mg by mouth every 6 (six) hours as needed for moderate pain.    . TURMERIC PO Take 500 mg by mouth every other day.    . vitamin E 400 UNIT capsule Take 400 Units by mouth every other day.     No current facility-administered medications for this encounter.     REVIEW OF SYSTEMS: On review of systems, the patient reports that she is doing well overall. No complaints are noted.   PHYSICAL EXAM:  Unable to assess due to encounter type.   ECOG =0  0 - Asymptomatic (Fully active, able to carry on all predisease  activities without restriction)  1 - Symptomatic but completely ambulatory (Restricted in physically strenuous activity but ambulatory and able to carry out work of a light or sedentary nature. For example, light housework, office work)  2 - Symptomatic, <50% in bed during the day (Ambulatory and capable of all self care but unable to carry out any work activities. Up and about more than 50% of waking hours)  3 - Symptomatic, >50% in bed, but not bedbound (Capable of only limited self-care, confined to bed or chair 50% or more of waking  hours)  4 - Bedbound (Completely disabled. Cannot carry on any self-care. Totally confined to bed or chair)  5 - Death   Eustace Pen MM, Creech RH, Tormey DC, et al. [phone]). "Toxicity and response criteria of the [facility]". [place] Oncol. 5 (6): 649-55    LABORATORY DATA:  Lab Results  Component Value Date   WBC 7.8 12/19/2019   HGB 13.4 12/19/2019   HCT 41.9 12/19/2019   MCV 94.2 12/19/2019   PLT 326 12/19/2019   Lab Results  Component Value Date   NA 141 02/21/2020   K 4.0 02/21/2020   CL 100 02/21/2020   CO2 25 02/21/2020   Lab Results  Component Value Date   ALT 13 02/21/2020   AST 19 02/21/2020   ALKPHOS 72 02/21/2020   BILITOT 0.3 02/21/2020      RADIOGRAPHY: CT CORONARY MORPH W/CTA COR W/SCORE W/CA W/CM &/OR WO/CM  Addendum Date: 03/05/2020   ADDENDUM REPORT: 03/05/2020 14:52 CLINICAL DATA:  79 year old female with known moderate CAD, hyperlipidemia and DOE. EXAM: Cardiac/Coronary  CTA TECHNIQUE: The patient was scanned on a [employer]. COMPARISON:  Coronary CTA in 2017 FINDINGS: A 100 kV prospective scan was triggered in the descending thoracic aorta at 111 HU's. Axial non-contrast 3 mm slices were carried out through the heart. The data set was analyzed on a dedicated work station and scored using the [place]. Gantry rotation speed was 250 msecs and collimation was .6 mm. 50 mg PO  Metoprolol and 0.8 mg of sl NTG was given. The 3D data set was reconstructed in 5% intervals of the 67-82 % of the R-R cycle. Diastolic phases were analyzed on a dedicated work station using MPR, MIP and VRT modes. The patient received 80 cc of contrast. Aorta:  Normal size.  No calcifications.  No dissection. Aortic Valve:  Trileaflet.  No calcifications. Coronary Arteries:  Normal coronary origin.  Right dominance. RCA is a large dominant artery that gives rise to PDA and PLA. There is mild diffuse plaque with stenosis 25-49% in the proximal, mid and distal RCA. Left main is a large caliber, short artery that gives rise to LAD and LCX arteries. Left main has mild calcified plaque with stenosis 25-49%. LAD is a large vessel that has moderate calcified plaque in the proximal portion with stenosis 50-69%. Mid LAD has diffuse, mixed, predominantly calcified plaque with stenosis 50-69%. D1 has mild diffuse calcified plaque with stenosis 25-49%. D2 is very small. LCX is a non-dominant artery that gives rise to one large OM1 branch. There is only minimal plaque. Other findings: Normal pulmonary vein drainage into the left atrium. Normal left atrial appendage without a thrombus. Normal size of the pulmonary artery. IMPRESSION: 1. Coronary calcium score of 1125. This was 94% percentile for age and sex matched control. Previously in 2017 calcium score was 681 (89 percentile). 2. Normal coronary origin with right dominance. 3. CAD-RADS 3. Moderate stenosis. Diffuse predominantly calcified plaque. Consider symptom-guided anti-ischemic pharmacotherapy as well as risk factor modification per guideline directed care. Additional analysis with CT FFR will be submitted. Electronically Signed   By: Ena Dawley   On: 03/05/2020 14:52   Result Date: 03/05/2020 EXAM: OVER-READ INTERPRETATION  CT CHEST The following report is an over-read performed by radiologist Dr. Vinnie Langton of [facility], [place] on 03/05/2020. This  over-read does not include interpretation of cardiac or coronary anatomy or pathology. The coronary calcium score/coronary CTA interpretation by the cardiologist is attached. COMPARISON:  Cardiac CTA 04/07/2016. FINDINGS: Aortic atherosclerosis. Within the visualized portions of the thorax there are no suspicious appearing pulmonary nodules or masses, there is no acute consolidative airspace disease, no pleural effusions, no pneumothorax and no lymphadenopathy. Visualized portions of the upper abdomen are unremarkable. There are no aggressive appearing lytic or blastic lesions noted in the visualized portions of the skeleton. IMPRESSION: 1.  Aortic Atherosclerosis (ICD10-I70.0). Electronically Signed: By: Vinnie Langton M.D. On: 03/05/2020 08:06   CT CORONARY FRACTIONAL FLOW RESERVE DATA PREP  Result Date: 03/05/2020 EXAM: CT FFR ANALYSIS CLINICAL DATA:  79 year old female with at least moderate plaque on CCTA. FINDINGS: FFRct analysis was performed on the original cardiac CT angiogram dataset. Diagrammatic representation of the FFRct analysis is provided in a separate PDF document in PACS. This dictation was created using the PDF document and an interactive 3D model of the results. 3D model is not available in the EMR/PACS. Normal FFR range is >0.80. 1. Left Main:  No significant stenosis. 2. LAD: No significant stenosis. 3. LCX: No significant stenosis. 4. RCA: No significant stenosis. IMPRESSION: 1. CT FFR analysis didn't show any significant stenosis. Aggressive medical management is indicated. Electronically Signed   By: Ena Dawley   On: 03/05/2020 14:57   US BREAST LTD UNI RIGHT INC AXILLA  Addendum Date: 02/18/2020   ADDENDUM REPORT: 02/18/2020 07:46 CLINICAL DATA:  79 year old female presenting as a recall from screening for possible RIGHT breast mass. EXAM: DIGITAL DIAGNOSTIC RIGHT MAMMOGRAM WITH TOMO ULTRASOUND RIGHT BREAST Electronically Signed   By: Audie Pinto M.D.   On: 02/18/2020  07:46   Result Date: 02/18/2020 CLINICAL DATA:  79 year old female presenting as a recall from screening for possible left breast mass. EXAM: DIGITAL DIAGNOSTIC LEFT MAMMOGRAM WITH TOMO ULTRASOUND LEFT BREAST COMPARISON:  Previous exam(s). ACR Breast Density Category b: There are scattered areas of fibroglandular density. FINDINGS: Mammogram: Spot compression tomosynthesis views of the right breast were performed for the questioned mass. There is persistence of a small irregular mass in the slightly inferior and posterior right breast measuring approximately 0.5 cm. Ultrasound: Targeted ultrasound is performed in the right breast at 6:30 o'clock 5 cm from nipple demonstrating an irregular hypoechoic mass with internal vascularity measuring 0.4 x 0.4 x 0.4 cm. This corresponds to the finding seen mammographically. Targeted ultrasound of the right axilla demonstrates normal-appearing lymph nodes. IMPRESSION: Right breast mass at 6:30 o'clock measuring 0.4 cm is suspicious. RECOMMENDATION: Ultrasound-guided core needle biopsy of the right breast mass at 6:30 o'clock. I have discussed the findings and recommendations with the patient. If applicable, a reminder letter will be sent to the patient regarding the next appointment. BI-RADS CATEGORY  4: Suspicious. Electronically Signed: By: Audie Pinto M.D. On: 02/15/2020 15:06   MM DIAG BREAST TOMO UNI RIGHT  Addendum Date: 02/18/2020   ADDENDUM REPORT: 02/18/2020 07:46 CLINICAL DATA:  79 year old female presenting as a recall from screening for possible RIGHT breast mass. EXAM: DIGITAL DIAGNOSTIC RIGHT MAMMOGRAM WITH TOMO ULTRASOUND RIGHT BREAST Electronically Signed   By: Audie Pinto M.D.   On: 02/18/2020 07:46   Result Date: 02/18/2020 CLINICAL DATA:  79 year old female presenting as a recall from screening for possible left breast mass. EXAM: DIGITAL DIAGNOSTIC LEFT MAMMOGRAM WITH TOMO ULTRASOUND LEFT BREAST COMPARISON:  Previous exam(s). ACR Breast  Density Category b: There are scattered areas of fibroglandular density. FINDINGS: Mammogram: Spot compression tomosynthesis views of the right breast were performed for the questioned mass. There is persistence of a small irregular mass in  the slightly inferior and posterior right breast measuring approximately 0.5 cm. Ultrasound: Targeted ultrasound is performed in the right breast at 6:30 o'clock 5 cm from nipple demonstrating an irregular hypoechoic mass with internal vascularity measuring 0.4 x 0.4 x 0.4 cm. This corresponds to the finding seen mammographically. Targeted ultrasound of the right axilla demonstrates normal-appearing lymph nodes. IMPRESSION: Right breast mass at 6:30 o'clock measuring 0.4 cm is suspicious. RECOMMENDATION: Ultrasound-guided core needle biopsy of the right breast mass at 6:30 o'clock. I have discussed the findings and recommendations with the patient. If applicable, a reminder letter will be sent to the patient regarding the next appointment. BI-RADS CATEGORY  4: Suspicious. Electronically Signed: By: Audie Pinto M.D. On: 02/15/2020 15:06   ECHOCARDIOGRAM COMPLETE  Result Date: 02/20/2020    ECHOCARDIOGRAM REPORT   Patient Name:   Andrea Farmer  Date of Exam: 02/20/2020 Medical Rec #:  [id number]     Height:       64.0 in Accession #:    [id number]    Weight:       174.1 lb Date of Birth:  1940/12/26      BSA:          1.844 m Patient Age:    61 years      BP:           132/60 mmHg Patient Gender: F             HR:           55 bpm. Exam Location:  [facility] Procedure: 2D Echo, Cardiac Doppler and Color Doppler Indications:    I49.3  History:        Patient has prior history of Echocardiogram examinations, most                 recent 03/12/2016. CAD, Arrythmias:PVC and NSVT; Risk                 Factors:Hypertension and Dyslipidemia.  Sonographer:    Coralyn Helling RDCS Referring Phys: [id number] [place]  1. Left ventricular ejection fraction, by  estimation, is 60 to 65%. The left ventricle has normal function. The left ventricle has no regional wall motion abnormalities. There is mild left ventricular hypertrophy. Left ventricular diastolic parameters are consistent with Grade I diastolic dysfunction (impaired relaxation).  2. Right ventricular systolic function is normal. The right ventricular size is normal. There is normal pulmonary artery systolic pressure. The estimated right ventricular systolic pressure is 82.5 mmHg.  3. The mitral valve is abnormal. Mild mitral valve regurgitation.  4. The aortic valve is tricuspid. Aortic valve regurgitation is not visualized.  5. Aortic dilatation noted. 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  6. The inferior vena cava is normal in size with greater than 50% respiratory variability, suggesting right atrial pressure of 3 mmHg. Comparison(s): No significant change from prior study. 03/12/2016: LVEF 60-65%, mild MR, aortic root 3.8 cm. FINDINGS  Left Ventricle: Left ventricular ejection fraction, by estimation, is 60 to 65%. The left ventricle has normal function. The left ventricle has no regional wall motion abnormalities. The left ventricular internal cavity size was normal in size. There is  mild left ventricular hypertrophy. Left ventricular diastolic parameters are consistent with Grade I diastolic dysfunction (impaired relaxation). Indeterminate filling pressures. Right Ventricle: The right ventricular size is normal. No increase in right ventricular wall thickness. Right ventricular systolic function is normal. There is normal pulmonary artery systolic pressure.  The tricuspid regurgitant velocity is 2.46 m/s, and  with an assumed right atrial pressure of 3 mmHg, the estimated right ventricular systolic pressure is 97.3 mmHg. Left Atrium: Left atrial size was normal in size. Right Atrium: Right atrial size was normal in size. Pericardium: There is no evidence of pericardial effusion.  Mitral Valve: The mitral valve is abnormal. There is mild calcification of the posterior mitral valve leaflet(s). Moderate mitral annular calcification. Mild mitral valve regurgitation. Tricuspid Valve: The tricuspid valve is grossly normal. Tricuspid valve regurgitation is mild. Aortic Valve: The aortic valve is tricuspid. Aortic valve regurgitation is not visualized. Pulmonic Valve: The pulmonic valve was normal in structure. Pulmonic valve regurgitation is not visualized. Aorta: Aortic dilatation noted. 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 Venous: The inferior vena cava is normal in size with greater than 50% respiratory variability, suggesting right atrial pressure of 3 mmHg. IAS/Shunts: No atrial level shunt detected by color flow Doppler.  LEFT VENTRICLE PLAX 2D LVIDd:         4.10 cm Diastology LVIDs:         2.80 cm LV e' lateral:   6.85 cm/s LV PW:         1.20 cm LV E/e' lateral: 13.9 LV IVS:        1.20 cm LV e' medial:    6.20 cm/s                        LV E/e' medial:  15.4  RIGHT VENTRICLE             IVC RV S prime:     15.10 cm/s  IVC diam: 1.60 cm TAPSE (M-mode): 2.4 cm RVSP:           27.2 mmHg LEFT ATRIUM             Index       RIGHT ATRIUM           Index LA diam:        3.10 cm 1.68 cm/m  RA Pressure: 3.00 mmHg LA Vol (A2C):   43.5 ml 23.58 ml/m RA Area:     12.40 cm LA Vol (A4C):   42.4 ml 22.99 ml/m RA Volume:   32.10 ml  17.40 ml/m LA Biplane Vol: 44.2 ml 23.96 ml/m  AORTIC VALVE LVOT Vmax:   105.00 cm/s LVOT Vmean:  71.100 cm/s LVOT VTI:    0.245 m  AORTA Ao Root diam: 3.60 cm Ao Asc diam:  3.90 cm MV E velocity: 95.20 cm/s   TRICUSPID VALVE MV A velocity: 114.00 cm/s  TR Peak grad:   24.2 mmHg MV E/A ratio:  0.84         TR Vmax:        246.00 cm/s                             Estimated RAP:  3.00 mmHg                             RVSP:           27.2 mmHg                              SHUNTS  Systemic VTI: 0.24 m Lyman Bishop MD  Electronically signed by Lyman Bishop MD Signature Date/Time: 02/20/2020/4:25:48 PM    Final    MM CLIP PLACEMENT RIGHT  Result Date: 02/25/2020 CLINICAL DATA:  Confirmation of clip placement after ultrasound-guided core needle biopsy of a new 4 mm mass involving the LOWER OUTER QUADRANT of the RIGHT breast. EXAM: 2D AND 3D TOMOSYNTHESIS DIAGNOSTIC RIGHT MAMMOGRAM POST ULTRASOUND BIOPSY COMPARISON:  Previous exam(s). FINDINGS: Tomosynthesis and synthesized full field CC and mediolateral images were obtained following ultrasound guided biopsy of a new 4 mm mass involving the LOWER OUTER QUADRANT of the RIGHT breast. The ribbon shaped tissue marking clip is approximately 1 cm LATERAL to the biopsied mass in the [place]. Expected post biopsy changes are present without evidence of hematoma. IMPRESSION: The ribbon shaped tissue marker clip is approximately 1 cm LATERAL to the biopsied mass in the LOWER OUTER QUADRANT of the RIGHT breast at MIDDLE depth. Please note that an X shaped tissue marker clip in the [place] was placed at the time of a remote benign core needle biopsy. Final Assessment: Post Procedure Mammograms for Marker Placement Electronically Signed   By: Evangeline Dakin M.D.   On: 02/25/2020 13:53   [place] RT BREAST BX W LOC DEV 1ST LESION IMG BX SPEC US GUIDE  Addendum Date: 02/26/2020   ADDENDUM REPORT: 02/26/2020 15:49 ADDENDUM: Pathology revealed GRADE III INVASIVE DUCTAL CARCINOMA of the RIGHT breast, lower outer quadrant at middle depth, 6:30 o'clock, 5cmfn. This was found to be concordant by Dr. Peggye Fothergill. Pathology results were discussed with the patient by telephone. The patient reported doing well after the biopsy with tenderness at the site. Post biopsy instructions and care were reviewed and questions were answered. The patient was encouraged to call [facility] for any additional concerns. Surgical consultation has been arranged  with Dr. Autumn Messing at [facility] on Mar 04, 2020. Pathology results reported by Stacie Acres RN on 02/26/2020. Electronically Signed   By: Evangeline Dakin M.D.   On: 02/26/2020 15:49   Result Date: 02/26/2020 CLINICAL DATA:  79 year old with a new screening detected 4 mm mass involving the LOWER OUTER QUADRANT of the RIGHT breast at MIDDLE depth at the 6:30 o'clock position approximately 5 cm from the nipple. Personal history of excisional biopsy of a high risk complex sclerosing lesion from the [place] of the RIGHT breast in July, 2020. EXAM: ULTRASOUND GUIDED RIGHT BREAST CORE NEEDLE BIOPSY COMPARISON:  Previous exam(s). PROCEDURE: I met with the patient and we discussed the procedure of ultrasound-guided biopsy, including benefits and alternatives. We discussed the high likelihood of a successful procedure. We discussed the risks of the procedure, including infection, bleeding, tissue injury, clip migration, and inadequate sampling. Informed written consent was given. The usual time-out protocol was performed immediately prior to the procedure. Lesion quadrant: LOWER OUTER QUADRANT. Using sterile technique with chlorhexidine as skin antisepsis, 1% lidocaine and 1% lidocaine with epinephrine as local anesthetic, under direct ultrasound visualization, a 12 gauge [employer] Marquee core needle device placed through an 11 gauge introducer needle was used to perform biopsy of the mass involving the LOWER OUTER QUADRANT of the RIGHT breast using a lateral approach. At the conclusion of the procedure a ribbon shaped tissue marker clip was deployed into the biopsy cavity. Follow up 2 view mammogram was performed and dictated separately. IMPRESSION: Ultrasound guided biopsy of a new 4 mm mass involving the LOWER OUTER  QUADRANT of the RIGHT breast. No apparent complications. Electronically Signed: By: Evangeline Dakin M.D. On: 02/25/2020 13:51       IMPRESSION/PLAN: 1. 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 Dr. Lisbeth Renshaw discusses the pathology findings and reviews the nature of right breast disease. The consensus from the breast conference includes breast conservation with lumpectomy with  sentinel node biopsy.  She would not require systemic chemotherapy or Oncotype testing per medical oncology, she would benefit from antiestrogen in the long-term, and would also likely benefit from radiotherapy.  We did discuss in conference that while the area is still quite small, given the grade 3 component it would be very reasonable to still offer adjuvant radiotherapy.  She is interested in proceeding and remaining aggressive for her care. We discussed the risks, benefits, short, and long term effects of radiotherapy, and the patient is interested in proceeding. Dr. Lisbeth Renshaw discusses the delivery and logistics of radiotherapy and anticipates a course of 4- 6 1/2 weeks of radiotherapy. We will see her back about 2 weeks after surgery to discuss the simulation process and anticipate we starting radiotherapy about 4-6 weeks after surgery.    Given current concerns for patient exposure during the COVID-19 pandemic, this encounter was conducted via telephone.  The patient has provided two factor identification and has given verbal consent for this type of encounter and has been advised to only accept a meeting of this type in a secure network environment. The time spent during this encounter was 45 minutes including preparation, discussion, and coordination of the patient's care. The attendants for this meeting include Blenda Nicely, RN, Dr. Lisbeth Renshaw, Hayden Pedro  and Karlene Lineman and her husband Dierdre Harness. During the encounter,  Blenda Nicely, RN, Dr. Lisbeth Renshaw, and Hayden Pedro were located at [facility] Radiation Oncology Department.  Andrea Farmer was located at home with her husband Dierdre Harness.    The above documentation reflects my direct  findings during this shared patient visit. Please see the separate note by Dr. Lisbeth Renshaw on this date for the remainder of the patient's plan of care.    Carola Rhine, PAC

## 2020-03-17 ENCOUNTER — Other Ambulatory Visit (HOSPITAL_COMMUNITY)
Admission: RE | Admit: 2020-03-17 | Discharge: 2020-03-17 | Disposition: A | Discharge status: Home / Self Care | Payer: PPO | Source: Ambulatory Visit | Attending: General Surgery | Admitting: General Surgery

## 2020-03-17 ENCOUNTER — Encounter (HOSPITAL_BASED_OUTPATIENT_CLINIC_OR_DEPARTMENT_OTHER)
Admission: RE | Admit: 2020-03-17 | Discharge: 2020-03-17 | Disposition: A | Discharge status: Home / Self Care | Payer: PPO | Source: Ambulatory Visit | Attending: General Surgery | Admitting: General Surgery

## 2020-03-17 DIAGNOSIS — Z20822 Contact with and (suspected) exposure to covid-19: Secondary | ICD-10-CM | POA: Insufficient documentation

## 2020-03-17 DIAGNOSIS — Z01812 Encounter for preprocedural laboratory examination: Secondary | ICD-10-CM | POA: Insufficient documentation

## 2020-03-17 LAB — BASIC METABOLIC PANEL
Anion gap: 10 (ref 5–15)
BUN: 16 mg/dL (ref 8–23)
CO2: 25 mmol/L (ref 22–32)
Calcium: 9.4 mg/dL (ref 8.9–10.3)
Chloride: 104 mmol/L (ref 98–111)
Creatinine, Ser: 0.6 mg/dL (ref 0.44–1.00)
GFR calc Af Amer: >60 mL/min (ref >60)
GFR calc non Af Amer: >60 mL/min (ref >60)
Glucose, Bld: 129 mg/dL — ABNORMAL HIGH (ref 70–99)
Potassium: 4 mmol/L (ref 3.5–5.1)
Sodium: 139 mmol/L (ref 135–145)

## 2020-03-17 LAB — SARS CORONAVIRUS 2 (TAT 6-24 HRS): SARS Coronavirus 2: NEGATIVE

## 2020-03-17 NOTE — Progress Notes (Signed)

## 2020-03-18 ENCOUNTER — Encounter: Payer: Self-pay | Admitting: Licensed Clinical Social Worker

## 2020-03-18 NOTE — Progress Notes (Signed)
[facility] [place] [employer]  [employer] representative contacted patient by phone  to offer support and assess for needs for newly diagnosed breast cancer. Patient reports that she is doing well. She tends to be a positive person and is using that to help her cope by putting it in perspective. She is ready to do what needs to be done to address it and move forward. No needs identified at this time.   CSW provided direct contact information should patient have needs in the future.        Brooke Payes, [place], [place] [facility]

## 2020-03-19 ENCOUNTER — Ambulatory Visit
Admission: RE | Admit: 2020-03-19 | Discharge: 2020-03-19 | Disposition: A | Discharge status: Home / Self Care | Payer: PPO | Source: Ambulatory Visit | Attending: General Surgery | Admitting: General Surgery

## 2020-03-19 DIAGNOSIS — R928 Other abnormal and inconclusive findings on diagnostic imaging of breast: Secondary | ICD-10-CM | POA: Diagnosis not present

## 2020-03-19 DIAGNOSIS — C50511 Malignant neoplasm of lower-outer quadrant of right female breast: Secondary | ICD-10-CM

## 2020-03-19 NOTE — Anesthesia Preprocedure Evaluation (Addendum)
Anesthesia Evaluation  Patient identified by MRN, date of birth, ID band Patient awake    Reviewed: Allergy & Precautions, NPO status , Patient's Chart, lab work & pertinent test results  History of Anesthesia Complications Negative for: history of anesthetic complications  Airway Mallampati: II  TM Distance: >3 FB Neck ROM: Full    Dental no notable dental hx.    Pulmonary neg pulmonary ROS,    Pulmonary exam normal        Cardiovascular hypertension, Pt. on medications Normal cardiovascular exam     Neuro/Psych negative neurological ROS  negative psych ROS   GI/Hepatic Neg liver ROS, GERD  Controlled,  Endo/Other  diabetes, Type 2, Oral Hypoglycemic Agents  Renal/GU negative Renal ROS  negative genitourinary   Musculoskeletal  (+) Arthritis ,   Abdominal   Peds  Hematology negative hematology ROS (+)   Anesthesia Other Findings Right breast ca  Reproductive/Obstetrics negative OB ROS                            Anesthesia Physical Anesthesia Plan  ASA: II  Anesthesia Plan: General   Post-op Pain Management: GA combined w/ Regional for post-op pain   Induction: Intravenous  PONV Risk Score and Plan: 3 and Treatment may vary due to age or medical condition, Ondansetron and Dexamethasone  Airway Management Planned: LMA  Additional Equipment:   Intra-op Plan:   Post-operative Plan: Extubation in OR  Informed Consent: I have reviewed the patients History and Physical, chart, labs and discussed the procedure including the risks, benefits and alternatives for the proposed anesthesia with the patient or authorized representative who has indicated his/her understanding and acceptance.     Dental advisory given  Plan Discussed with: CRNA  Anesthesia Plan Comments:        Anesthesia Quick Evaluation

## 2020-03-20 ENCOUNTER — Ambulatory Visit
Admission: RE | Admit: 2020-03-20 | Discharge: 2020-03-20 | Disposition: A | Discharge status: Home / Self Care | Payer: PPO | Source: Ambulatory Visit | Attending: General Surgery | Admitting: General Surgery

## 2020-03-20 ENCOUNTER — Ambulatory Visit (HOSPITAL_BASED_OUTPATIENT_CLINIC_OR_DEPARTMENT_OTHER): Payer: PPO | Admitting: Anesthesiology

## 2020-03-20 ENCOUNTER — Encounter (HOSPITAL_BASED_OUTPATIENT_CLINIC_OR_DEPARTMENT_OTHER)
Admission: RE | Disposition: A | Discharge status: Home / Self Care | Payer: Self-pay | Source: Home / Self Care | Attending: General Surgery

## 2020-03-20 ENCOUNTER — Ambulatory Visit (HOSPITAL_BASED_OUTPATIENT_CLINIC_OR_DEPARTMENT_OTHER)
Admission: RE | Admit: 2020-03-20 | Discharge: 2020-03-20 | Disposition: A | Discharge status: Home / Self Care | Payer: PPO | Attending: General Surgery | Admitting: General Surgery

## 2020-03-20 ENCOUNTER — Ambulatory Visit (HOSPITAL_COMMUNITY)
Admission: RE | Admit: 2020-03-20 | Discharge: 2020-03-20 | Disposition: A | Discharge status: Home / Self Care | Payer: PPO | Source: Ambulatory Visit | Attending: General Surgery | Admitting: General Surgery

## 2020-03-20 ENCOUNTER — Encounter (HOSPITAL_BASED_OUTPATIENT_CLINIC_OR_DEPARTMENT_OTHER): Payer: Self-pay | Admitting: General Surgery

## 2020-03-20 ENCOUNTER — Other Ambulatory Visit: Payer: Self-pay

## 2020-03-20 DIAGNOSIS — C50511 Malignant neoplasm of lower-outer quadrant of right female breast: Secondary | ICD-10-CM | POA: Insufficient documentation

## 2020-03-20 DIAGNOSIS — Z170 Estrogen receptor positive status [ER+]: Secondary | ICD-10-CM | POA: Diagnosis not present

## 2020-03-20 DIAGNOSIS — M1990 Unspecified osteoarthritis, unspecified site: Secondary | ICD-10-CM | POA: Insufficient documentation

## 2020-03-20 DIAGNOSIS — Z7982 Long term (current) use of aspirin: Secondary | ICD-10-CM | POA: Diagnosis not present

## 2020-03-20 DIAGNOSIS — Z79899 Other long term (current) drug therapy: Secondary | ICD-10-CM | POA: Diagnosis not present

## 2020-03-20 DIAGNOSIS — C50911 Malignant neoplasm of unspecified site of right female breast: Secondary | ICD-10-CM | POA: Diagnosis not present

## 2020-03-20 DIAGNOSIS — I10 Essential (primary) hypertension: Secondary | ICD-10-CM | POA: Diagnosis not present

## 2020-03-20 DIAGNOSIS — E119 Type 2 diabetes mellitus without complications: Secondary | ICD-10-CM | POA: Insufficient documentation

## 2020-03-20 DIAGNOSIS — Z7984 Long term (current) use of oral hypoglycemic drugs: Secondary | ICD-10-CM | POA: Diagnosis not present

## 2020-03-20 DIAGNOSIS — G8918 Other acute postprocedural pain: Secondary | ICD-10-CM | POA: Diagnosis not present

## 2020-03-20 DIAGNOSIS — R928 Other abnormal and inconclusive findings on diagnostic imaging of breast: Secondary | ICD-10-CM | POA: Diagnosis not present

## 2020-03-20 HISTORY — PX: BREAST LUMPECTOMY WITH RADIOACTIVE SEED AND SENTINEL LYMPH NODE BIOPSY: SHX6550

## 2020-03-20 LAB — GLUCOSE, CAPILLARY
Glucose-Capillary: 111 mg/dL — ABNORMAL HIGH (ref 70–99)
Glucose-Capillary: 164 mg/dL — ABNORMAL HIGH (ref 70–99)

## 2020-03-20 SURGERY — BREAST LUMPECTOMY WITH RADIOACTIVE SEED AND SENTINEL LYMPH NODE BIOPSY
Anesthesia: General | Site: Breast | Laterality: Right

## 2020-03-20 MED ORDER — HYDROCODONE-ACETAMINOPHEN 5-325 MG PO TABS: 1.0000 | ORAL_TABLET | Freq: Four times a day (QID) | ORAL | 0 refills | Status: DC | PRN

## 2020-03-20 MED ORDER — ONDANSETRON HCL 4 MG/2ML IJ SOLN
INTRAMUSCULAR | Status: DC | PRN
  Administered 2020-03-20: 4 mg via INTRAVENOUS

## 2020-03-20 MED ORDER — ONDANSETRON HCL 4 MG/2ML IJ SOLN
INTRAMUSCULAR | Status: AC
  Filled 2020-03-20: qty 2

## 2020-03-20 MED ORDER — LIDOCAINE 2% (20 MG/ML) 5 ML SYRINGE
INTRAMUSCULAR | Status: AC
  Filled 2020-03-20: qty 5

## 2020-03-20 MED ORDER — BUPIVACAINE HCL 0.25 % IJ SOLN: INTRAMUSCULAR | Status: DC | PRN

## 2020-03-20 MED ORDER — SODIUM CHLORIDE (PF) 0.9 % IJ SOLN
INTRAMUSCULAR | Status: AC
  Filled 2020-03-20: qty 10

## 2020-03-20 MED ORDER — FENTANYL CITRATE (PF) 100 MCG/2ML IJ SOLN
INTRAMUSCULAR | Status: AC
  Filled 2020-03-20: qty 2

## 2020-03-20 MED ORDER — CHLORHEXIDINE GLUCONATE CLOTH 2 % EX PADS: 6.0000 | MEDICATED_PAD | Freq: Once | CUTANEOUS | Status: DC

## 2020-03-20 MED ORDER — LACTATED RINGERS IV SOLN: INTRAVENOUS | Status: DC

## 2020-03-20 MED ORDER — LIDOCAINE 2% (20 MG/ML) 5 ML SYRINGE
INTRAMUSCULAR | Status: DC | PRN
  Administered 2020-03-20: 80 mg via INTRAVENOUS

## 2020-03-20 MED ORDER — FENTANYL CITRATE (PF) 100 MCG/2ML IJ SOLN
INTRAMUSCULAR | Status: DC | PRN
  Administered 2020-03-20 (×2): 25 ug via INTRAVENOUS

## 2020-03-20 MED ORDER — DEXAMETHASONE SODIUM PHOSPHATE 10 MG/ML IJ SOLN
INTRAMUSCULAR | Status: AC
  Filled 2020-03-20: qty 2

## 2020-03-20 MED ORDER — CEFAZOLIN SODIUM-DEXTROSE 2-4 GM/100ML-% IV SOLN
INTRAVENOUS | Status: AC
  Filled 2020-03-20: qty 100

## 2020-03-20 MED ORDER — GABAPENTIN 300 MG PO CAPS
300.0000 mg | ORAL_CAPSULE | ORAL | Status: AC
  Administered 2020-03-20: 300 mg via ORAL

## 2020-03-20 MED ORDER — PROPOFOL 10 MG/ML IV BOLUS
INTRAVENOUS | Status: DC | PRN
  Administered 2020-03-20: 150 mg via INTRAVENOUS

## 2020-03-20 MED ORDER — BUPIVACAINE-EPINEPHRINE (PF) 0.5% -1:200000 IJ SOLN
INTRAMUSCULAR | Status: DC | PRN
  Administered 2020-03-20: 20 mL via PERINEURAL

## 2020-03-20 MED ORDER — CELECOXIB 200 MG PO CAPS
ORAL_CAPSULE | ORAL | Status: AC
  Filled 2020-03-20: qty 1

## 2020-03-20 MED ORDER — MIDAZOLAM HCL 2 MG/2ML IJ SOLN
INTRAMUSCULAR | Status: AC
  Filled 2020-03-20: qty 2

## 2020-03-20 MED ORDER — ACETAMINOPHEN 500 MG PO TABS
1000.0000 mg | ORAL_TABLET | Freq: Once | ORAL | Status: AC
  Administered 2020-03-20: 1000 mg via ORAL

## 2020-03-20 MED ORDER — ACETAMINOPHEN 500 MG PO TABS: 1000.0000 mg | ORAL_TABLET | ORAL | Status: DC

## 2020-03-20 MED ORDER — BUPIVACAINE LIPOSOME 1.3 % IJ SUSP
INTRAMUSCULAR | Status: DC | PRN
  Administered 2020-03-20: 10 mL via PERINEURAL

## 2020-03-20 MED ORDER — PROPOFOL 10 MG/ML IV BOLUS
INTRAVENOUS | Status: AC
  Filled 2020-03-20: qty 20

## 2020-03-20 MED ORDER — OXYCODONE HCL 5 MG PO TABS: 5.0000 mg | ORAL_TABLET | Freq: Once | ORAL | Status: DC | PRN

## 2020-03-20 MED ORDER — ACETAMINOPHEN 500 MG PO TABS
ORAL_TABLET | ORAL | Status: AC
  Filled 2020-03-20: qty 2

## 2020-03-20 MED ORDER — PROMETHAZINE HCL 25 MG/ML IJ SOLN: 6.2500 mg | INTRAMUSCULAR | Status: DC | PRN

## 2020-03-20 MED ORDER — FENTANYL CITRATE (PF) 100 MCG/2ML IJ SOLN
25.0000 ug | INTRAMUSCULAR | Status: DC | PRN
  Administered 2020-03-20: 50 ug via INTRAVENOUS

## 2020-03-20 MED ORDER — CELECOXIB 200 MG PO CAPS
200.0000 mg | ORAL_CAPSULE | ORAL | Status: AC
  Administered 2020-03-20: 200 mg via ORAL

## 2020-03-20 MED ORDER — METHYLENE BLUE 0.5 % INJ SOLN
INTRAVENOUS | Status: AC
  Filled 2020-03-20: qty 10

## 2020-03-20 MED ORDER — BUPIVACAINE HCL (PF) 0.25 % IJ SOLN
INTRAMUSCULAR | Status: AC
  Filled 2020-03-20: qty 30

## 2020-03-20 MED ORDER — DEXAMETHASONE SODIUM PHOSPHATE 10 MG/ML IJ SOLN
INTRAMUSCULAR | Status: DC | PRN
  Administered 2020-03-20: 10 mg via INTRAVENOUS

## 2020-03-20 MED ORDER — CEFAZOLIN SODIUM-DEXTROSE 2-4 GM/100ML-% IV SOLN
2.0000 g | INTRAVENOUS | Status: AC
  Administered 2020-03-20: 2 g via INTRAVENOUS

## 2020-03-20 MED ORDER — TECHNETIUM TC 99M SULFUR COLLOID FILTERED
1.0000 | Freq: Once | INTRAVENOUS | Status: AC | PRN
  Administered 2020-03-20: 1 via INTRADERMAL

## 2020-03-20 MED ORDER — BUPIVACAINE HCL (PF) 0.25 % IJ SOLN
INTRAMUSCULAR | Status: DC | PRN
  Administered 2020-03-20: 27 mL

## 2020-03-20 MED ORDER — FENTANYL CITRATE (PF) 100 MCG/2ML IJ SOLN
100.0000 ug | Freq: Once | INTRAMUSCULAR | Status: AC
  Administered 2020-03-20: 100 ug via INTRAVENOUS

## 2020-03-20 MED ORDER — OXYCODONE HCL 5 MG/5ML PO SOLN: 5.0000 mg | Freq: Once | ORAL | Status: DC | PRN

## 2020-03-20 MED ORDER — GABAPENTIN 300 MG PO CAPS
ORAL_CAPSULE | ORAL | Status: AC
  Filled 2020-03-20: qty 1

## 2020-03-20 SURGICAL SUPPLY — 43 items
APL PRP STRL LF DISP 70% ISPRP (MISCELLANEOUS) ×1
APPLIER CLIP 9.375 MED OPEN (MISCELLANEOUS) ×4
BINDER BREAST XXLRG (GAUZE/BANDAGES/DRESSINGS) ×2 IMPLANT
BLADE SURG 15 STRL LF DISP TIS (BLADE) ×1 IMPLANT
BLADE SURG 15 STRL SS (BLADE) ×2
CANISTER SUC SOCK COL 7IN (MISCELLANEOUS) IMPLANT
CANISTER SUCT 1200ML W/VALVE (MISCELLANEOUS) IMPLANT
CHLORAPREP W/TINT 26 (MISCELLANEOUS) ×2 IMPLANT
CLIP APPLIE 9.375 MED OPEN (MISCELLANEOUS) ×2 IMPLANT
COVER BACK TABLE 60X90IN (DRAPES) ×2 IMPLANT
COVER MAYO STAND STRL (DRAPES) ×2 IMPLANT
COVER PROBE W GEL 5X96 (DRAPES) ×2 IMPLANT
COVER WAND RF STERILE (DRAPES) IMPLANT
DECANTER SPIKE VIAL GLASS SM (MISCELLANEOUS) IMPLANT
DERMABOND ADVANCED (GAUZE/BANDAGES/DRESSINGS) ×1
DERMABOND ADVANCED .7 DNX12 (GAUZE/BANDAGES/DRESSINGS) ×1 IMPLANT
DRAPE LAPAROSCOPIC ABDOMINAL (DRAPES) ×2 IMPLANT
DRAPE UTILITY XL STRL (DRAPES) ×2 IMPLANT
ELECT COATED BLADE 2.86 ST (ELECTRODE) ×2 IMPLANT
ELECT REM PT RETURN 9FT ADLT (ELECTROSURGICAL) ×2
ELECTRODE REM PT RTRN 9FT ADLT (ELECTROSURGICAL) ×1 IMPLANT
GLOVE BIO SURGEON STRL SZ7.5 (GLOVE) ×2 IMPLANT
GOWN STRL REUS W/ TWL LRG LVL3 (GOWN DISPOSABLE) ×3 IMPLANT
GOWN STRL REUS W/TWL LRG LVL3 (GOWN DISPOSABLE) ×6
ILLUMINATOR WAVEGUIDE N/F (MISCELLANEOUS) IMPLANT
KIT MARKER MARGIN INK (KITS) ×2 IMPLANT
LIGHT WAVEGUIDE WIDE FLAT (MISCELLANEOUS) IMPLANT
NDL SAFETY ECLIPSE 18X1.5 (NEEDLE) IMPLANT
NEEDLE HYPO 18GX1.5 SHARP (NEEDLE)
NEEDLE HYPO 25X1 1.5 SAFETY (NEEDLE) ×2 IMPLANT
NS IRRIG 1000ML POUR BTL (IV SOLUTION) ×2 IMPLANT
PENCIL SMOKE EVACUATOR (MISCELLANEOUS) ×2 IMPLANT
SET BASIN DAY SURGERY F.S. (CUSTOM PROCEDURE TRAY) ×2 IMPLANT
SLEEVE SCD COMPRESS KNEE MED (MISCELLANEOUS) ×2 IMPLANT
SPONGE LAP 18X18 RF (DISPOSABLE) ×2 IMPLANT
SUT MON AB 4-0 PC3 18 (SUTURE) ×4 IMPLANT
SUT SILK 2 0 SH (SUTURE) IMPLANT
SUT VICRYL 3-0 CR8 SH (SUTURE) ×2 IMPLANT
SYR CONTROL 10ML LL (SYRINGE) ×2 IMPLANT
TOWEL GREEN STERILE FF (TOWEL DISPOSABLE) ×2 IMPLANT
TRAY FAXITRON CT DISP (TRAY / TRAY PROCEDURE) ×2 IMPLANT
TUBE CONNECTING 20X1/4 (TUBING) IMPLANT
YANKAUER SUCT BULB TIP NO VENT (SUCTIONS) ×2 IMPLANT

## 2020-03-20 NOTE — Anesthesia Postprocedure Evaluation (Signed)
Anesthesia Post Note  Patient: Andrea Farmer  Procedure(s) Performed: RIGHT BREAST LUMPECTOMY WITH RADIOACTIVE SEED AND SENTINEL LYMPH NODE BIOPSY (Right Breast)     Patient location during evaluation: PACU Anesthesia Type: General Level of consciousness: awake and alert and oriented Pain management: pain level controlled Vital Signs Assessment: post-procedure vital signs reviewed and stable Respiratory status: spontaneous breathing, nonlabored ventilation and respiratory function stable Cardiovascular status: blood pressure returned to baseline Postop Assessment: no apparent nausea or vomiting Anesthetic complications: no   No complications documented.  Last Vitals:  Vitals:   03/20/20 1110 03/20/20 1115  BP:  116/74  Pulse: 62 (!) 58  Resp: 16 11  Temp:  36.6 C  SpO2: 97% 97%    Last Pain:  Vitals:   03/20/20 1115  TempSrc:   PainSc: 0-No pain                 Brennan Bailey

## 2020-03-20 NOTE — Anesthesia Procedure Notes (Signed)
Procedure Name: LMA Insertion Date/Time: 03/20/2020 8:49 AM Performed by: [place] Sheyanne Munley C, CRNA Pre-anesthesia Checklist: Patient identified, Emergency Drugs available, Suction available and Patient being monitored Patient Re-evaluated:Patient Re-evaluated prior to induction Oxygen Delivery Method: Circle system utilized Preoxygenation: Pre-oxygenation with 100% oxygen Induction Type: IV induction Ventilation: Mask ventilation without difficulty LMA: LMA inserted LMA Size: 4.0 Number of attempts: 1 Airway Equipment and Method: Bite block Placement Confirmation: positive ETCO2 Tube secured with: Tape Dental Injury: Teeth and Oropharynx as per pre-operative assessment

## 2020-03-20 NOTE — Op Note (Signed)
03/20/2020  10:36 AM  PATIENT:  Andrea Farmer  79 y.o. female  PRE-OPERATIVE DIAGNOSIS:  RIGHT BREAST CANCER  POST-OPERATIVE DIAGNOSIS:  RIGHT BREAST CANCER  PROCEDURE:  Procedure(s): RIGHT BREAST LUMPECTOMY WITH RADIOACTIVE SEED LOCALIZATION AND DEEP RIGHT AXILLARY SENTINEL LYMPH NODE BIOPSY (Right)  SURGEON:  Surgeon(s) and Role:    * Jovita Kussmaul, MD - Primary  PHYSICIAN ASSISTANT:   ASSISTANTS: none   ANESTHESIA:   local and general  EBL:  minimal   BLOOD ADMINISTERED:none  DRAINS: none   LOCAL MEDICATIONS USED:  MARCAINE     SPECIMEN:  Source of Specimen:  right breast tissue and sentinel nodes x 2  DISPOSITION OF SPECIMEN:  PATHOLOGY  COUNTS:  YES  TOURNIQUET:  * No tourniquets in log *  DICTATION: .Dragon Dictation   After informed consent was obtained the patient was brought to the operating room and placed in the supine position on the operating table.  After adequate induction of general anesthesia the patient's right chest, breast, and axillary area were prepped with ChloraPrep, allowed to dry, and draped in usual sterile manner.  An appropriate timeout was performed.  Previously an I-125 seed was placed in the lower portion of the right breast to mark an area of invasive breast cancer.  Earlier in the day the patient also underwent injection 1 mCi of technetium sulfur colloid in the subareolar position on the right.  The neoprobe was set to technetium and there was a radioactive signal in the right axilla.  The area overlying this was infiltrated with quarter percent Marcaine.  A small transversely oriented incision was made with a 15 blade knife overlying the area of radioactivity.  The incision was carried through the skin and subcutaneous tissue sharply with the electrocautery until the deep right axillary space was entered.  There was a general signal of radioactivity that never really wavered from the baseline throughout the entire axilla.  I was able to  identify a 2 palpable lymph nodes.  These were excised sharply with the electrocautery and the surrounding lymphatics and vessels were controlled with clips.  Ex vivo counts on these nodes were very similar to the background at around 25 counts.  These were sent to pathology as sentinel nodes numbers 1 and 2.  No other hot or palpable nodes were identified in the right axilla.  Hemostasis was achieved using the Bovie electrocautery.  The deep layer of the wound was then closed with interrupted 3-0 Vicryl stitches.  The skin was closed with a running 4-0 Monocryl subcuticular stitch.  Attention was then turned to the right breast.  The neoprobe was set to I-125 and again the area of radioactivity was readily identified in the lower portion of the right breast.  The area around this was infiltrated with quarter percent Marcaine.  I elected to access this area through a inframammary fold incision.  The incision was carried through the skin and subcutaneous tissue sharply with the electrocautery until the dissection was carried down to the chest wall.  The breast tissue was then separated from the chest wall muscle sharply with the electrocautery until we were well beyond the area of the radioactive seed.  Dissection was then carried out anteriorly between the breast tissue and the subcutaneous fat until we were well beyond the area of the radioactive seed.  A wedge of breast tissue was then excised sharply with the electrocautery around the radioactive seed while checking the area of radioactivity frequently.  Once the  specimen was removed it was oriented with the appropriate paint colors.  A specimen radiograph was obtained that showed the clip and seed to be near the center of the specimen.  The specimen was then sent to pathology for further evaluation.  Hemostasis was achieved using the Bovie electrocautery.  The wound was irrigated with saline and infiltrated with more quarter percent Marcaine.  The deep layer of  the wound was then closed with layers of interrupted 3-0 Vicryl stitches.  The skin was then closed with a running 4-0 Monocryl subcuticular stitch.  Dermabond dressings were applied.  The patient tolerated the procedure well.  At the end of the case all needle sponge and instrument counts were correct.  The patient was then awakened and taken to recovery in stable condition.  PLAN OF CARE: Discharge to home after PACU  PATIENT DISPOSITION:  PACU - hemodynamically stable.   Delay start of Pharmacological VTE agent (>24hrs) due to surgical blood loss or risk of bleeding: not applicable

## 2020-03-20 NOTE — Anesthesia Procedure Notes (Signed)
Anesthesia Regional Block: Pectoralis block   Pre-Anesthetic Checklist: ,, timeout performed, Correct Patient, Correct Site, Correct Laterality, Correct Procedure, Correct Position, site marked, Risks and benefits discussed, pre-op evaluation,  At surgeon's request and post-op pain management  Laterality: Right  Prep: Maximum Sterile Barrier Precautions used, chloraprep       Needles:  Injection technique: Single-shot  Needle Type: Echogenic Stimulator Needle     Needle Length: 9cm  Needle Gauge: 22     Additional Needles:   Procedures:,,,, ultrasound used (permanent image in chart),,,,  Narrative:  Start time: 03/20/2020 8:06 AM End time: 03/20/2020 8:09 AM Injection made incrementally with aspirations every 5 mL.  Performed by: Personally  Anesthesiologist: Brennan Bailey, MD  Additional Notes: Risks, benefits, and alternative discussed. Patient gave consent for procedure. Patient prepped and draped in sterile fashion. Sedation administered, patient remains easily responsive to voice. Relevant anatomy identified with ultrasound guidance. Local anesthetic given in 5cc increments with no signs or symptoms of intravascular injection. No pain or paraesthesias with injection. Patient monitored throughout procedure with signs of LAST or immediate complications. Tolerated well. Ultrasound image placed in chart.  Tawny Asal, MD

## 2020-03-20 NOTE — Progress Notes (Signed)
Assisted Dr. Daiva Huge with right, ultrasound guided, axillary block. Side rails up, monitors on throughout procedure. See vital signs in flow sheet. Tolerated Procedure well.

## 2020-03-20 NOTE — Interval H&P Note (Signed)
History and Physical Interval Note:  03/20/2020 7:53 AM  Andrea Farmer  has presented today for surgery, with the diagnosis of RIGHT BREAST CANCER.  The various methods of treatment have been discussed with the patient and family. After consideration of risks, benefits and other options for treatment, the patient has consented to  Procedure(s): RIGHT BREAST LUMPECTOMY WITH RADIOACTIVE SEED AND SENTINEL LYMPH NODE BIOPSY (Right) as a surgical intervention.  The patient's history has been reviewed, patient examined, no change in status, stable for surgery.  I have reviewed the patient's chart and labs.  Questions were answered to the patient's satisfaction.     [name]

## 2020-03-20 NOTE — Discharge Instructions (Signed)
No tylenol until after 1:10pm No Ibuprofen or Motrin until after 3pm  Post Anesthesia Home Care Instructions  Activity: Get plenty of rest for the remainder of the day. A responsible individual must stay with you for 24 hours following the procedure.  For the next 24 hours, DO NOT: -Drive a car -Paediatric nurse -Drink alcoholic beverages -Take any medication unless instructed by your physician -Make any legal decisions or sign important papers.  Meals: Start with liquid foods such as gelatin or soup. Progress to regular foods as tolerated. Avoid greasy, spicy, heavy foods. If nausea and/or vomiting occur, drink only clear liquids until the nausea and/or vomiting subsides. Call your physician if vomiting continues.  Special Instructions/Symptoms: Your throat may feel dry or sore from the anesthesia or the breathing tube placed in your throat during surgery. If this causes discomfort, gargle with warm salt water. The discomfort should disappear within 24 hours.  If you had a scopolamine patch placed behind your ear for the management of post- operative nausea and/or vomiting:  1. The medication in the patch is effective for 72 hours, after which it should be removed.  Wrap patch in a tissue and discard in the trash. Wash hands thoroughly with soap and water. 2. You may remove the patch earlier than 72 hours if you experience unpleasant side effects which may include dry mouth, dizziness or visual disturbances. 3. Avoid touching the patch. Wash your hands with soap and water after contact with the patch.    Information for Discharge Teaching: EXPAREL (bupivacaine liposome injectable suspension)   Your surgeon or anesthesiologist gave you EXPAREL(bupivacaine) to help control your pain after surgery.   EXPAREL is a local anesthetic that provides pain relief by numbing the tissue around the surgical site.  EXPAREL is designed to release pain medication over time and can control pain for  up to 72 hours.  Depending on how you respond to EXPAREL, you may require less pain medication during your recovery.  Possible side effects:  Temporary loss of sensation or ability to move in the area where bupivacaine was injected.  Nausea, vomiting, constipation  Rarely, numbness and tingling in your mouth or lips, lightheadedness, or anxiety may occur.  Call your doctor right away if you think you may be experiencing any of these sensations, or if you have other questions regarding possible side effects.  Follow all other discharge instructions given to you by your surgeon or nurse. Eat a healthy diet and drink plenty of water or other fluids.  If you return to the hospital for any reason within 96 hours following the administration of EXPAREL, it is important for health care providers to know that you have received this anesthetic. A teal colored band has been placed on your arm with the date, time and amount of EXPAREL you have received in order to alert and inform your health care providers. Please leave this armband in place for the full 96 hours following administration, and then you may remove the band.

## 2020-03-20 NOTE — Transfer of Care (Signed)
Immediate Anesthesia Transfer of Care Note  Patient: Andrea Farmer  Procedure(s) Performed: RIGHT BREAST LUMPECTOMY WITH RADIOACTIVE SEED AND SENTINEL LYMPH NODE BIOPSY (Right Breast)  Patient Location: PACU  Anesthesia Type:GA combined with regional for post-op pain  Level of Consciousness: awake, alert  and oriented  Airway & Oxygen Therapy: Patient Spontanous Breathing and Patient connected to face mask oxygen  Post-op Assessment: Report given to RN and Post -op Vital signs reviewed and stable  Post vital signs: Reviewed and stable  Last Vitals:  Vitals Value Taken Time  BP    Temp    Pulse 74 03/20/20 1044  Resp 20 03/20/20 1044  SpO2 100 % 03/20/20 1044  Vitals shown include unvalidated device data.  Last Pain:  Vitals:   03/20/20 0704  TempSrc: Oral  PainSc: 0-No pain      Patients Stated Pain Goal: 3 (75/10/25 8527)  Complications: No complications documented.

## 2020-03-20 NOTE — Progress Notes (Signed)
Nuc med injections completed. Patient tolerated well.   

## 2020-03-20 NOTE — H&P (Signed)
Andrea Farmer  Location: [facility] Surgery Patient #: [id number] DOB: [birth year] Divorced / Language: Cleophus Farmer / Race: White Female   History of Present Illness  The patient is a 79 year old female who presents for a follow-up for Breast cancer. The patient is a 79 year old white female who is one year status post right breast lumpectomy for a complex sclerosing lesion. On her screening mammogram this year was found to have a 4 mm mass in the 6 o'clock position of the right breast. 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 Radiographically her lymph nodes were normal. She denies any breast pain or discharge from the nipple. She does report having a skipped heartbeat here and there and is seen her cardiologist Dr. Meda Coffee this afternoon for this.   Allergies  No Known Drug Allergies   Medication History  Rosuvastatin Calcium (20MG Tablet, Oral) Active. hydroCHLOROthiazide (25MG Tablet, Oral) Active. amLODIPine Besy-Benazepril HCl (5-40MG Capsule, Oral) Active. metFORMIN HCl ER (500MG Tablet ER 24HR, Oral) Active. Align (Oral) Active. Nasonex (50MCG/ACT Suspension, Nasal) Active. Aspirin (81MG Tablet, Oral) Active. Vitamin D (1000UNIT Tablet, Oral) Active. Co Q-10 (200MG Capsule, Oral) Active. Turmeric (500MG Tablet, Oral) Active. Multivitamin Adults 50+ (Oral) Active. Vitamin E (400UNIT Tablet, Oral) Active. Vitamin B-12 (1000MCG Tablet, Oral) Active. Calcium-Vitamin D (600MG Tablet Chewable, Oral) Active. Medications Reconciled    Review of Systems  General Not Present- Appetite Loss, Chills, Fatigue, Fever, Night Sweats, Weight Gain and Weight Loss. Skin Not Present- Change in Wart/Mole, Dryness, Hives, Jaundice, New Lesions, Non-Healing Wounds, Rash and Ulcer. HEENT Present- Wears glasses/contact lenses. Not Present- Earache, Hearing Loss, Hoarseness,  Nose Bleed, Oral Ulcers, Ringing in the Ears, Seasonal Allergies, Sinus Pain, Sore Throat, Visual Disturbances and Yellow Eyes. Respiratory Not Present- Bloody sputum, Chronic Cough, Difficulty Breathing, Snoring and Wheezing. Breast Present- Breast Mass and Breast Pain. Not Present- Nipple Discharge and Skin Changes. Cardiovascular Not Present- Chest Pain, Difficulty Breathing Lying Down, Leg Cramps, Palpitations, Rapid Heart Rate, Shortness of Breath and Swelling of Extremities. Gastrointestinal Not Present- Abdominal Pain, Bloating, Bloody Stool, Change in Bowel Habits, Chronic diarrhea, Constipation, Difficulty Swallowing, Excessive gas, Gets full quickly at meals, Hemorrhoids, Indigestion, Nausea, Rectal Pain and Vomiting. Female Genitourinary Not Present- Frequency, Nocturia, Painful Urination, Pelvic Pain and Urgency. Musculoskeletal Not Present- Back Pain, Joint Pain, Joint Stiffness, Muscle Pain, Muscle Weakness and Swelling of Extremities. Neurological Not Present- Decreased Memory, Fainting, Headaches, Numbness, Seizures, Tingling, Tremor, Trouble walking and Weakness. Psychiatric Not Present- Anxiety, Bipolar, Change in Sleep Pattern, Depression, Fearful and Frequent crying. Endocrine Present- New Diabetes. Not Present- Cold Intolerance, Excessive Hunger, Hair Changes, Heat Intolerance and Hot flashes. Hematology Not Present- Blood Thinners, Easy Bruising, Excessive bleeding, Gland problems, HIV and Persistent Infections.  Vitals  Weight: 173 lb Height: 64in Body Surface Area: 1.84 m Body Mass Index: 29.7 kg/m  Temp.: 68F  Pulse: 68 (Regular)        Physical Exam  General Mental Status-Alert. General Appearance-Consistent with stated age. Hydration-Well hydrated. Voice-Normal.  Head and Neck Head-normocephalic, atraumatic with no lesions or palpable masses. Trachea-midline. Thyroid Gland Characteristics - normal size and  consistency.  Eye Eyeball - Bilateral-Extraocular movements intact. Sclera/Conjunctiva - Bilateral-No scleral icterus.  Chest and Lung Exam Chest and lung exam reveals -quiet, even and easy respiratory effort with no use of accessory muscles and on auscultation, normal breath sounds, no adventitious sounds and normal vocal  resonance. Inspection Chest Wall - Normal. Back - normal.  Breast Note: There is no palpable mass in either breast. There is no palpable axillary, supraclavicular, or cervical lymphadenopathy.   Cardiovascular Cardiovascular examination reveals -normal heart sounds, regular rate and rhythm with no murmurs and normal pedal pulses bilaterally.  Abdomen Inspection Inspection of the abdomen reveals - No Hernias. Skin - Scar - no surgical scars. Palpation/Percussion Palpation and Percussion of the abdomen reveal - Soft, Non Tender, No Rebound tenderness, No Rigidity (guarding) and No hepatosplenomegaly. Auscultation Auscultation of the abdomen reveals - Bowel sounds normal.  Neurologic Neurologic evaluation reveals -alert and oriented x 3 with no impairment of recent or remote memory. Mental Status-Normal.  Musculoskeletal Normal Exam - Left-Upper Extremity Strength Normal and Lower Extremity Strength Normal. Normal Exam - Right-Upper Extremity Strength Normal and Lower Extremity Strength Normal.  Lymphatic Head & Neck  General Head & Neck Lymphatics: Bilateral - Description - Normal. Axillary  General Axillary Region: Bilateral - Description - Normal. Tenderness - Non Tender. Femoral & Inguinal  Generalized Femoral & Inguinal Lymphatics: Bilateral - Description - Normal. Tenderness - Non Tender.    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 I have discussed with her in detail the options for treatment and at this point she favors breast conservation. She is also a good candidate for sentinel node biopsy. I have discussed with her in detail the risks and benefits of the operation as well as some of the technical aspects including the use of a radioactive seed for localization and she understands and wishes to proceed. I will also refer her to medical and radiation oncology to discuss adjuvant therapy. She also has an appointment with her cardiologist this afternoon for her heart skipping a beat here and there. We will ask for cardiac clearance from Dr. Meda Coffee. Otherwise we will proceed with surgery. This patient encounter took 45 minutes today to perform the following: take history, perform exam, review outside records, interpret imaging, counsel the patient on their diagnosis and document encounter, findings & plan in the EHR Current Plans Referred to Oncology, for evaluation and follow up (Oncology). Routine.

## 2020-03-21 ENCOUNTER — Encounter (HOSPITAL_BASED_OUTPATIENT_CLINIC_OR_DEPARTMENT_OTHER): Payer: Self-pay | Admitting: General Surgery

## 2020-03-21 LAB — SURGICAL PATHOLOGY

## 2020-03-24 ENCOUNTER — Encounter: Payer: Self-pay | Admitting: *Deleted

## 2020-03-24 DIAGNOSIS — E1169 Type 2 diabetes mellitus with other specified complication: Secondary | ICD-10-CM | POA: Diagnosis not present

## 2020-03-26 ENCOUNTER — Other Ambulatory Visit: Payer: Self-pay | Admitting: Family Medicine

## 2020-03-26 DIAGNOSIS — D2272 Melanocytic nevi of left lower limb, including hip: Secondary | ICD-10-CM | POA: Diagnosis not present

## 2020-03-26 DIAGNOSIS — L905 Scar conditions and fibrosis of skin: Secondary | ICD-10-CM | POA: Diagnosis not present

## 2020-03-26 DIAGNOSIS — L820 Inflamed seborrheic keratosis: Secondary | ICD-10-CM | POA: Diagnosis not present

## 2020-03-26 DIAGNOSIS — Z853 Personal history of malignant neoplasm of breast: Secondary | ICD-10-CM

## 2020-03-26 DIAGNOSIS — D692 Other nonthrombocytopenic purpura: Secondary | ICD-10-CM | POA: Diagnosis not present

## 2020-03-26 DIAGNOSIS — D1801 Hemangioma of skin and subcutaneous tissue: Secondary | ICD-10-CM | POA: Diagnosis not present

## 2020-03-26 DIAGNOSIS — D2262 Melanocytic nevi of left upper limb, including shoulder: Secondary | ICD-10-CM | POA: Diagnosis not present

## 2020-03-26 DIAGNOSIS — L821 Other seborrheic keratosis: Secondary | ICD-10-CM | POA: Diagnosis not present

## 2020-03-30 ENCOUNTER — Other Ambulatory Visit: Payer: Self-pay | Admitting: Cardiology

## 2020-03-30 NOTE — Progress Notes (Signed)
Patient Care Team: Lawerance Cruel, MD as PCP - General (Family Medicine) Dorothy Spark, MD as PCP - Cardiology (Cardiology) Mauro Kaufmann, RN as Oncology Nurse Navigator Rockwell Germany, RN as Oncology Nurse Navigator Nicholas Lose, MD as Consulting Physician (Hematology and Oncology) Jovita Kussmaul, MD as Consulting Physician (General Surgery) Kyung Rudd, MD as Consulting Physician (Radiation Oncology)  DIAGNOSIS:    ICD-10-CM   1. Malignant neoplasm of lower-outer quadrant of right breast of female, estrogen receptor positive ([place])  C50.511    Z17.0     SUMMARY OF ONCOLOGIC HISTORY: Oncology History  Malignant neoplasm of lower-outer quadrant of right breast of female, estrogen receptor positive ([place])  02/15/2020 Initial Diagnosis   Screening mammogram detected right breast mass. 0.4cm mass at the 6:30 position and no axillary adenopathy. Biopsy: invasive ductal carcinoma, grade 3, HER-2 negative (1+), ER+ 95%, PR- 0%, KI67 40%.   03/20/2020 Surgery   Right lumpectomy [name]): invasive and in situ ductal carcinoma, 0.5cm, clear margins, 4 right axillary lymph nodes negative      CHIEF COMPLIANT: Follow-up sp right lumpectomy to review pathology   INTERVAL HISTORY: BRITIANY SILBERNAGEL is a 79 y.o. with above-mentioned history of right breast cancer. She underwent a right lumpectomy on 03/20/20 with Dr. [name] for which pathology showed invasive and in situ ductal carcinoma, 0.5cm, clear margins, 4 right axillary lymph nodes negative for carcinoma. She presents to the clinic today to discuss the pathology report and further treatment.  She complains of pain underneath the arm and some numbness etc.  Overall she appears to have done fairly well from surgery standpoint.  ALLERGIES:  has No Known Allergies.  MEDICATIONS:  Current Outpatient Medications  Medication Sig Dispense Refill  . amLODipine-benazepril (LOTREL) 5-40 MG capsule Take 1 capsule by mouth daily.    Marland Kitchen aspirin  (ASPIRIN EC) 81 MG EC tablet Take 81 mg by mouth daily. Swallow whole.     . Calcium Carbonate-Vitamin D (CALCIUM 600+D) 600-400 MG-UNIT tablet Take 1 tablet by mouth 3 (three) times a week.     . Coenzyme Q10 (CO Q 10 PO) Take 200 mg by mouth every other day.     . ezetimibe (ZETIA) 10 MG tablet Take 1 tablet (10 mg total) by mouth daily. 90 tablet 2  . fexofenadine (ALLEGRA) 180 MG tablet Take 180 mg by mouth daily as needed for allergies or rhinitis.    . Glucosamine-Chondroit-Vit C-Mn (GLUCOSAMINE 1500 COMPLEX PO) Take 1 tablet by mouth every other day.    . hydrochlorothiazide (HYDRODIURIL) 25 MG tablet Take 25 mg by mouth daily.    Marland Kitchen HYDROcodone-acetaminophen (NORCO/VICODIN) 5-325 MG tablet Take 1-2 tablets by mouth every 6 (six) hours as needed for moderate pain or severe pain. 10 tablet 0  . metFORMIN (GLUCOPHAGE) 500 MG tablet Take by mouth daily with breakfast.    . mometasone (NASONEX) 50 MCG/ACT nasal spray Place 2 sprays into the nose 3 (three) times a week.     . Multiple Vitamin (MULTIVITAMIN WITH MINERALS) TABS tablet Take 1 tablet by mouth 3 (three) times a week.     . Omega-3 Fatty Acids (OMEGA 3 PO) Take 1 capsule by mouth 3 (three) times a week.     . potassium chloride (KLOR-CON) 10 MEQ tablet Take 1 tablet (10 mEq total) by mouth daily. 90 tablet 2  . Probiotic Product (ALIGN) 4 MG CAPS Take 4 mg by mouth daily. supplement    . psyllium (HYDROCIL/METAMUCIL) 95 %  PACK Take 1 packet by mouth daily.     . rosuvastatin (CRESTOR) 40 MG tablet Take 1 tablet (40 mg total) by mouth daily. 90 tablet 2  . traMADol (ULTRAM) 50 MG tablet Take 50 mg by mouth every 6 (six) hours as needed for moderate pain.    . TURMERIC PO Take 500 mg by mouth every other day.    . vitamin E 400 UNIT capsule Take 400 Units by mouth every other day.     No current facility-administered medications for this visit.    PHYSICAL EXAMINATION: ECOG PERFORMANCE STATUS: 1 - Symptomatic but completely  ambulatory  Vitals:   03/31/20 1324  BP: (!) 116/57  Pulse: 68  Resp: 17  Temp: 98.9 F (37.2 C)  SpO2: 97%   Filed Weights   03/31/20 1324  Weight: 174 lb 11.2 oz (79.2 kg)    LABORATORY DATA:  I have reviewed the data as listed CMP Latest Ref Rng & Units 03/17/2020 02/21/2020 01/01/2020  Glucose 70 - 99 mg/dL 129(H) 110(H) 124(H)  BUN 8 - 23 mg/dL '16 19 20  '$ Creatinine 0.44 - 1.00 mg/dL 0.60 0.66 0.76  Sodium 135 - 145 mmol/L 139 141 139  Potassium 3.5 - 5.1 mmol/L 4.0 4.0 4.5  Chloride 98 - 111 mmol/L 104 100 98  CO2 22 - 32 mmol/L '25 25 24  '$ Calcium 8.9 - 10.3 mg/dL 9.4 9.9 10.9(H)  Total Protein 6.0 - 8.5 g/dL - 6.7 -  Total Bilirubin 0.0 - 1.2 mg/dL - 0.3 -  Alkaline Phos 39 - 117 IU/L - 72 -  AST 0 - 40 IU/L - 19 -  ALT 0 - 32 IU/L - 13 -    Lab Results  Component Value Date   WBC 7.8 12/19/2019   HGB 13.4 12/19/2019   HCT 41.9 12/19/2019   MCV 94.2 12/19/2019   PLT 326 12/19/2019   NEUTROABS 4.7 12/19/2019    ASSESSMENT & PLAN:  Malignant neoplasm of lower-outer quadrant of right breast of female, estrogen receptor positive ([place]) 02/15/2020:Screening mammogram detected right breast mass. 0.4cm mass at the 6:30 position and no axillary adenopathy. Biopsy: invasive ductal carcinoma, grade 3, HER-2 negative (1+), ER+ 95%, PR- 0%, KI67 40%. T1 a N0 stage Ia clinical stage  03/20/2020:Right lumpectomy [name]): invasive and in situ ductal carcinoma, 0.5cm, clear margins, 4 right axillary lymph nodes negative T1 a N0 stage Ia pathologic stage  Pathology counseling: I discussed the final pathology report of the patient provided  a copy of this report. I discussed the margins as well as lymph node surgeries. We also discussed the final staging along with previously performed ER/PR and HER-2/neu testing.  Treatment plan: 1. Adjuvant radiation therapy followed by 2. Adjuvant antiestrogen therapy with anastrozole 1 mg daily x5 years  Patient stays very busy with traveling  and staying active. We discussed the pros and cons of radiation. She wishes to get radiation therapy if it is going to help prevent recurrences even though there is no survival advantage.  Return to clinic after radiation to start antiestrogen therapy    No orders of the defined types were placed in this encounter.  The patient has a good understanding of the overall plan. she agrees with it. she will call with any problems that may develop before the next visit here.  Total time spent: 30 mins including face to face time and time spent for planning, charting and coordination of care  Nicholas Lose, MD 03/31/2020  I, Cloyde Reams Dorshimer,  am acting as scribe for Dr. Nicholas Lose.  I have reviewed the above documentation for accuracy and completeness, and I agree with the above.

## 2020-03-31 ENCOUNTER — Other Ambulatory Visit: Payer: Self-pay | Admitting: *Deleted

## 2020-03-31 ENCOUNTER — Other Ambulatory Visit: Payer: Self-pay

## 2020-03-31 ENCOUNTER — Inpatient Hospital Stay: Payer: PPO | Admitting: Hematology and Oncology

## 2020-03-31 DIAGNOSIS — Z170 Estrogen receptor positive status [ER+]: Secondary | ICD-10-CM | POA: Diagnosis not present

## 2020-03-31 DIAGNOSIS — C50511 Malignant neoplasm of lower-outer quadrant of right female breast: Secondary | ICD-10-CM

## 2020-03-31 NOTE — Assessment & Plan Note (Signed)
02/15/2020:Screening mammogram detected right breast mass. 0.4cm mass at the 6:30 position and no axillary adenopathy. Biopsy: invasive ductal carcinoma, grade 3, HER-2 negative (1+), ER+ 95%, PR- 0%, KI67 40%. T1 a N0 stage Ia clinical stage  03/20/2020:Right lumpectomy [name]): invasive and in situ ductal carcinoma, 0.5cm, clear margins, 4 right axillary lymph nodes negative T1 a N0 stage Ia pathologic stage  Pathology counseling: I discussed the final pathology report of the patient provided  a copy of this report. I discussed the margins as well as lymph node surgeries. We also discussed the final staging along with previously performed ER/PR and HER-2/neu testing.  Treatment plan: 1. Adjuvant radiation therapy followed by 2. Adjuvant antiestrogen therapy with anastrozole 1 mg daily x5 years  Patient stays very busy with traveling and staying active. Return to clinic annually for follow-ups.

## 2020-04-02 ENCOUNTER — Telehealth: Payer: Self-pay | Admitting: Hematology and Oncology

## 2020-04-02 NOTE — Telephone Encounter (Signed)
No 6/21 los, no change made to patient schedule  

## 2020-04-04 ENCOUNTER — Encounter: Payer: Self-pay | Admitting: *Deleted

## 2020-04-09 NOTE — Progress Notes (Signed)
Location of Breast Cancer: Right Breast Cancer   Histology per Pathology Report: Right Lumpectomy 03/20/2020  Receptor Status: ER(95% +), PR (0% -), Her2-neu (-), Ki-67(40%)   Past/Anticipated interventions by surgeon, if any: Right Lumpectomy with SLN biopsy 03/20/2020   Lymphedema issues, if any: No.  She notes around the incision she feels puffy.   SAFETY ISSUES:  Prior radiation? No  Pacemaker/ICD? No  Possible current pregnancy? Postmenopausal  Is the patient on methotrexate? No  Current Complaints / other details:      Cori Razor, RN 04/09/2020,3:22 PM

## 2020-04-10 ENCOUNTER — Ambulatory Visit
Admission: RE | Admit: 2020-04-10 | Discharge: 2020-04-10 | Disposition: A | Discharge status: Home / Self Care | Payer: PPO | Source: Ambulatory Visit | Attending: Radiation Oncology | Admitting: Radiation Oncology

## 2020-04-10 ENCOUNTER — Encounter: Payer: Self-pay | Admitting: Radiation Oncology

## 2020-04-10 ENCOUNTER — Other Ambulatory Visit: Payer: Self-pay

## 2020-04-10 VITALS — BP 140/73 | HR 64 | Temp 97.6°F | Resp 20 | Ht 64.0 in | Wt 174.4 lb

## 2020-04-10 DIAGNOSIS — K219 Gastro-esophageal reflux disease without esophagitis: Secondary | ICD-10-CM | POA: Insufficient documentation

## 2020-04-10 DIAGNOSIS — Z809 Family history of malignant neoplasm, unspecified: Secondary | ICD-10-CM | POA: Insufficient documentation

## 2020-04-10 DIAGNOSIS — L03313 Cellulitis of chest wall: Secondary | ICD-10-CM | POA: Diagnosis not present

## 2020-04-10 DIAGNOSIS — M549 Dorsalgia, unspecified: Secondary | ICD-10-CM | POA: Insufficient documentation

## 2020-04-10 DIAGNOSIS — C50511 Malignant neoplasm of lower-outer quadrant of right female breast: Secondary | ICD-10-CM | POA: Diagnosis not present

## 2020-04-10 DIAGNOSIS — I10 Essential (primary) hypertension: Secondary | ICD-10-CM | POA: Insufficient documentation

## 2020-04-10 DIAGNOSIS — M8580 Other specified disorders of bone density and structure, unspecified site: Secondary | ICD-10-CM | POA: Diagnosis not present

## 2020-04-10 DIAGNOSIS — M1990 Unspecified osteoarthritis, unspecified site: Secondary | ICD-10-CM | POA: Diagnosis not present

## 2020-04-10 DIAGNOSIS — E1169 Type 2 diabetes mellitus with other specified complication: Secondary | ICD-10-CM | POA: Diagnosis not present

## 2020-04-10 DIAGNOSIS — Z79899 Other long term (current) drug therapy: Secondary | ICD-10-CM | POA: Insufficient documentation

## 2020-04-10 DIAGNOSIS — Z86010 Personal history of colonic polyps: Secondary | ICD-10-CM | POA: Insufficient documentation

## 2020-04-10 DIAGNOSIS — E7800 Pure hypercholesterolemia, unspecified: Secondary | ICD-10-CM | POA: Diagnosis not present

## 2020-04-10 DIAGNOSIS — Z510 Encounter for antineoplastic radiation therapy: Secondary | ICD-10-CM | POA: Diagnosis not present

## 2020-04-10 DIAGNOSIS — E119 Type 2 diabetes mellitus without complications: Secondary | ICD-10-CM | POA: Insufficient documentation

## 2020-04-10 DIAGNOSIS — L0390 Cellulitis, unspecified: Secondary | ICD-10-CM | POA: Insufficient documentation

## 2020-04-10 DIAGNOSIS — C50919 Malignant neoplasm of unspecified site of unspecified female breast: Secondary | ICD-10-CM | POA: Diagnosis not present

## 2020-04-10 DIAGNOSIS — M179 Osteoarthritis of knee, unspecified: Secondary | ICD-10-CM | POA: Diagnosis not present

## 2020-04-10 DIAGNOSIS — Z170 Estrogen receptor positive status [ER+]: Secondary | ICD-10-CM | POA: Insufficient documentation

## 2020-04-10 DIAGNOSIS — Z85038 Personal history of other malignant neoplasm of large intestine: Secondary | ICD-10-CM | POA: Diagnosis not present

## 2020-04-10 DIAGNOSIS — C50911 Malignant neoplasm of unspecified site of right female breast: Secondary | ICD-10-CM | POA: Insufficient documentation

## 2020-04-10 DIAGNOSIS — Z9889 Other specified postprocedural states: Secondary | ICD-10-CM | POA: Diagnosis not present

## 2020-04-10 MED ORDER — DOXYCYCLINE HYCLATE 100 MG PO TABS
100.0000 mg | ORAL_TABLET | Freq: Two times a day (BID) | ORAL | 0 refills | Status: DC
Start: 2020-04-10 — End: 2020-11-27

## 2020-04-10 NOTE — Progress Notes (Signed)
Radiation Oncology         [phone] ________________________________   Name: Andrea Farmer        MRN: [id number]  Date of Service: 04/10/2020 DOB: 1941-01-05  AT:FTDD, Dwyane Luo, MD  Nicholas Lose, MD     REFERRING PHYSICIAN: Nicholas Lose, MD   DIAGNOSIS: The encounter diagnosis was Malignant neoplasm of lower-outer quadrant of right breast of female, estrogen receptor positive ([place]).   HISTORY OF PRESENT ILLNESS: Andrea Farmer is a 79 y.o. female with a newly diagnosed right breast cancer. The patient was noted to have a complex sclerosing lesion in the right breast for which she underwent lumpectomy in July 2020.  No invasive disease was identified.  She was being followed with routine screening mammography and a mass was noted in the breast at 630 o'clock.  Further diagnostic imaging revealed this at 4 x 4 x 4 mm, her axilla was negative for adenopathy.  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  Her Ki-67 was 40%.  She proceeded with lumpectomy on 03/17/20 which revealed a 5 mm invasive ductal carcinoma with associated in situ disease. Her margins were negative and 4 sampled nodes were negative for disease. She is seen today to discuss adjuvant radiotherapy.     PREVIOUS RADIATION THERAPY: No   PAST MEDICAL HISTORY:  Past Medical History:  Diagnosis Date  . Acid reflux   . Back pain    arthritis  . Cataract    immature but doesn't know which eye  . Diabetes mellitus without complication ([place])   . High cholesterol    takes Crestor daily  . History of colon polyps    benign  . History of migraine   . Hypertension    takes Lotrel and HCTZ dailydaily  . Irritable bowel syndrome 03/11/2016  . Joint pain   . Joint swelling   . Osteoarthritis of knee 03/11/2016  . Pneumonia    30 plus yrs ago       PAST SURGICAL HISTORY: Past Surgical History:  Procedure Laterality Date  .  ABDOMINAL HYSTERECTOMY    . BREAST BIOPSY Right 05/14/2015  . BREAST EXCISIONAL BIOPSY Left   . BREAST EXCISIONAL BIOPSY Right   . BREAST LUMPECTOMY WITH RADIOACTIVE SEED AND SENTINEL LYMPH NODE BIOPSY Right 03/20/2020   Procedure: RIGHT BREAST LUMPECTOMY WITH RADIOACTIVE SEED AND SENTINEL LYMPH NODE BIOPSY;  Surgeon: Jovita Kussmaul, MD;  Location: [place];  Service: General;  Laterality: Right;  . BREAST LUMPECTOMY WITH RADIOACTIVE SEED LOCALIZATION Right 04/27/2019   Procedure: RIGHT BREAST LUMPECTOMY WITH RADIOACTIVE SEED LOCALIZATION;  Surgeon: Jovita Kussmaul, MD;  Location: [place];  Service: General;  Laterality: Right;  . RECTAL SURGERY     x 3  . REPLACEMENT TOTAL KNEE BILATERAL    . SYNOVECTOMY Left 07/21/2015   Procedure: LEFT KNEE SYNOVECTOMY with POLY EXCHANGE;  Surgeon: Vickey Huger, MD;  Location: [place];  Service: Orthopedics;  Laterality: Left;  . TOTAL KNEE REVISION Right 07/05/2016   Procedure: TOTAL KNEE REVISION WITH SCAR DEBRIDEMENT/PATELLA REVISION WITH POLY EXCHANGE;  Surgeon: Vickey Huger, MD;  Location: [place];  Service: Orthopedics;  Laterality: Right;     FAMILY HISTORY:  Family History  Problem Relation Age of Onset  . Cancer Mother   . Aneurysm Father      SOCIAL HISTORY:  reports that she has never smoked. She  has never used smokeless tobacco. She reports current alcohol use. She reports that she does not use drugs. The patient is married and lives in [place]. She is retired but is very active.   ALLERGIES: Patient has no known allergies.   MEDICATIONS:  Current Outpatient Medications  Medication Sig Dispense Refill  . amLODipine-benazepril (LOTREL) 5-40 MG capsule Take 1 capsule by mouth daily.    Marland Kitchen aspirin (ASPIRIN EC) 81 MG EC tablet Take 81 mg by mouth daily. Swallow whole.     . Calcium Carbonate-Vitamin D (CALCIUM 600+D) 600-400 MG-UNIT tablet Take 1 tablet by mouth 3 (three) times a week.     . Coenzyme Q10 (CO  Q 10 PO) Take 200 mg by mouth every other day.     . ezetimibe (ZETIA) 10 MG tablet Take 1 tablet (10 mg total) by mouth daily. 90 tablet 2  . fexofenadine (ALLEGRA) 180 MG tablet Take 180 mg by mouth daily as needed for allergies or rhinitis.    . Glucosamine-Chondroit-Vit C-Mn (GLUCOSAMINE 1500 COMPLEX PO) Take 1 tablet by mouth every other day.    . hydrochlorothiazide (HYDRODIURIL) 25 MG tablet Take 25 mg by mouth daily.    Marland Kitchen HYDROcodone-acetaminophen (NORCO/VICODIN) 5-325 MG tablet Take 1-2 tablets by mouth every 6 (six) hours as needed for moderate pain or severe pain. 10 tablet 0  . metFORMIN (GLUCOPHAGE) 500 MG tablet Take by mouth daily with breakfast.    . mometasone (NASONEX) 50 MCG/ACT nasal spray Place 2 sprays into the nose 3 (three) times a week.     . Multiple Vitamin (MULTIVITAMIN WITH MINERALS) TABS tablet Take 1 tablet by mouth 3 (three) times a week.     . Omega-3 Fatty Acids (OMEGA 3 PO) Take 1 capsule by mouth 3 (three) times a week.     . potassium chloride (KLOR-CON) 10 MEQ tablet Take 1 tablet (10 mEq total) by mouth daily. 90 tablet 2  . Probiotic Product (ALIGN) 4 MG CAPS Take 4 mg by mouth daily. supplement    . psyllium (HYDROCIL/METAMUCIL) 95 % PACK Take 1 packet by mouth daily.     . rosuvastatin (CRESTOR) 40 MG tablet Take 1 tablet (40 mg total) by mouth daily. 90 tablet 2  . traMADol (ULTRAM) 50 MG tablet Take 50 mg by mouth every 6 (six) hours as needed for moderate pain.    . TURMERIC PO Take 500 mg by mouth every other day.    . vitamin E 400 UNIT capsule Take 400 Units by mouth every other day.    Marland Kitchen doxycycline (VIBRA-TABS) 100 MG tablet Take 1 tablet (100 mg total) by mouth 2 (two) times daily. 10 tablet 0   No current facility-administered medications for this encounter.     REVIEW OF SYSTEMS: On review of systems, the patient reports that she is doing well overall. She has had a slight area of redness she's noted in the axillary incision site. She denies  drainage or heat to the touch. She denies any chest pain, shortness of breath, cough, fevers, chills, night sweats, unintended weight changes. She denies any bowel or bladder disturbances, and denies abdominal pain, nausea or vomiting. She denies any new musculoskeletal or joint aches or pains, new skin lesions or concerns. A complete review of systems is obtained and is otherwise negative.   PHYSICAL EXAM:  Wt Readings from Last 3 Encounters:  04/10/20 174 lb 6.4 oz (79.1 kg)  03/31/20 174 lb 11.2 oz (79.2 kg)  03/20/20 173 lb 8  oz (78.7 kg)   Temp Readings from Last 3 Encounters:  04/10/20 97.6 F (36.4 C)  03/31/20 98.9 F (37.2 C) (Temporal)  03/20/20 98.1 F (36.7 C)   BP Readings from Last 3 Encounters:  04/10/20 140/73  03/31/20 (!) 116/57  03/20/20 125/65   Pulse Readings from Last 3 Encounters:  04/10/20 64  03/31/20 68  03/20/20 (!) 58   In general this is a well appearing caucasian female in no acute distress. She's alert and oriented x4 and appropriate throughout the examination. Cardiopulmonary assessment is negative for acute distress and she exhibits normal effort. Her right breast is healing well along the inframammary incision site. She does have erythema along the axillary incision site seen below that blanches and his very mild heat to the touch. No punctate changes are otherwise seen. No separation of her incision sites are otherwise noted.      ECOG =0  0 - Asymptomatic (Fully active, able to carry on all predisease activities without restriction)  1 - Symptomatic but completely ambulatory (Restricted in physically strenuous activity but ambulatory and able to carry out work of a light or sedentary nature. For example, light housework, office work)  2 - Symptomatic, <50% in bed during the day (Ambulatory and capable of all self care but unable to carry out any work activities. Up and about more than 50% of waking hours)  3 - Symptomatic, >50% in bed, but  not bedbound (Capable of only limited self-care, confined to bed or chair 50% or more of waking hours)  4 - Bedbound (Completely disabled. Cannot carry on any self-care. Totally confined to bed or chair)  5 - Death   Eustace Pen MM, Creech RH, Tormey DC, et al. [phone]). "Toxicity and response criteria of the [facility] Group". [place] Oncol. 5 (6): 649-55    LABORATORY DATA:  Lab Results  Component Value Date   WBC 7.8 12/19/2019   HGB 13.4 12/19/2019   HCT 41.9 12/19/2019   MCV 94.2 12/19/2019   PLT 326 12/19/2019   Lab Results  Component Value Date   NA 139 03/17/2020   K 4.0 03/17/2020   CL 104 03/17/2020   CO2 25 03/17/2020   Lab Results  Component Value Date   ALT 13 02/21/2020   AST 19 02/21/2020   ALKPHOS 72 02/21/2020   BILITOT 0.3 02/21/2020      RADIOGRAPHY: NM Sentinel Node Inj-No Rpt (Breast)  Result Date: 03/20/2020 Sulfur colloid was injected by the nuclear medicine technologist for melanoma sentinel node.   MM Breast Surgical Specimen  Result Date: 03/20/2020 CLINICAL DATA:  Radioactive seed placement was performed of the right breast prior to lumpectomy. EXAM: SPECIMEN RADIOGRAPH OF THE RIGHT BREAST COMPARISON:  Previous exam(s). FINDINGS: Status post excision of the right breast. The radioactive seed and X ribbon biopsy marker clip are present, completely intact, and were marked for pathology. Also noted in the specimen is a surgical clip, confirmed by the operating room staff. IMPRESSION: Specimen radiograph of the right breast. Electronically Signed   By: Curlene Dolphin M.D.   On: 03/20/2020 10:16   MM RT RADIOACTIVE SEED LOC MAMMO GUIDE  Result Date: 03/19/2020 CLINICAL DATA:  79 year old female presenting for radioactive seed localization of the right breast prior to lumpectomy. EXAM: MAMMOGRAPHIC GUIDED RADIOACTIVE SEED LOCALIZATION OF THE RIGHT BREAST COMPARISON:  Previous exam(s). FINDINGS: Patient presents for radioactive seed  localization prior to right breast lumpectomy. I met with the patient and we discussed the  procedure of seed localization including benefits and alternatives. We discussed the high likelihood of a successful procedure. We discussed the risks of the procedure including infection, bleeding, tissue injury and further surgery. We discussed the low dose of radioactivity involved in the procedure. Informed, written consent was given. The usual time-out protocol was performed immediately prior to the procedure. Using mammographic guidance, sterile technique, 1% lidocaine and an I-125 radioactive seed, the site just medial to the biopsy marking clip was localized using a medial approach. The follow-up mammogram images confirm the seed in the expected location and were marked for Dr. [name]. Follow-up survey of the patient confirms presence of the radioactive seed. Order number of I-125 seed:  [id number]. Total activity:  3.567 millicuries reference Date: 02/22/2020 The patient tolerated the procedure well and was released from the [facility]. She was given instructions regarding seed removal. IMPRESSION: Radioactive seed localization right breast. No apparent complications. Electronically Signed   By: Ammie Ferrier M.D.   On: 03/19/2020 14:41       IMPRESSION/PLAN: 1. 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 Dr. Lisbeth Renshaw discusses the final pathology findings and reviews the nature of right breast disease. The patient does have a mild cellulitis but I do not think this would prevent [place] from being able to start her treatment once treated. She does not require systemic chemotherapy but is interested in remaining aggressive about her adjuvant radiotherapy options as we discussed the rationale to consider this.  She is interested in proceeding and remaining aggressive for her care. We discussed the risks, benefits, short, and long term effects of radiotherapy, and the patient  is interested in proceeding. Dr. Lisbeth Renshaw discusses the delivery and logistics of radiotherapy and recommends a course of 4  weeks of radiotherapy. Written consent is obtained and placed in the chart, a copy was provided to the patient. She will simulate today.  2. Cellulitis. I will start Doxycyline 100 mg BID for 10 days and I've reached out to Dr. [name] about this as well as he will see her in a few weeks.   In a visit lasting 60 minutes, greater than 50% of the time was spent face to face discussing the patient's condition, in preparation for the discussion, and coordinating the patient's care.   The above documentation reflects my direct findings during this shared patient visit. Please see the separate note by Dr. Lisbeth Renshaw on this date for the remainder of the patient's plan of care.    Carola Rhine, PAC

## 2020-04-15 DIAGNOSIS — Z510 Encounter for antineoplastic radiation therapy: Secondary | ICD-10-CM | POA: Diagnosis not present

## 2020-04-15 DIAGNOSIS — C50511 Malignant neoplasm of lower-outer quadrant of right female breast: Secondary | ICD-10-CM | POA: Diagnosis not present

## 2020-04-17 ENCOUNTER — Other Ambulatory Visit: Payer: Self-pay

## 2020-04-17 ENCOUNTER — Encounter: Payer: Self-pay | Admitting: *Deleted

## 2020-04-17 ENCOUNTER — Ambulatory Visit: Payer: PPO | Admitting: Radiation Oncology

## 2020-04-17 DIAGNOSIS — I10 Essential (primary) hypertension: Secondary | ICD-10-CM

## 2020-04-17 DIAGNOSIS — I2510 Atherosclerotic heart disease of native coronary artery without angina pectoris: Secondary | ICD-10-CM

## 2020-04-17 DIAGNOSIS — E7800 Pure hypercholesterolemia, unspecified: Secondary | ICD-10-CM

## 2020-04-17 MED ORDER — EZETIMIBE 10 MG PO TABS: 10.0000 mg | ORAL_TABLET | Freq: Every day | ORAL | 3 refills | Status: DC

## 2020-04-18 ENCOUNTER — Ambulatory Visit: Payer: PPO

## 2020-04-18 ENCOUNTER — Telehealth: Payer: Self-pay | Admitting: Hematology and Oncology

## 2020-04-18 NOTE — Telephone Encounter (Signed)
Scheduled appt per 7/8 sch msg - mailed reminder letter with appt date and time

## 2020-04-21 ENCOUNTER — Ambulatory Visit
Admission: RE | Admit: 2020-04-21 | Discharge: 2020-04-21 | Disposition: A | Discharge status: Home / Self Care | Payer: PPO | Source: Ambulatory Visit | Attending: Radiation Oncology | Admitting: Radiation Oncology

## 2020-04-21 ENCOUNTER — Other Ambulatory Visit: Payer: Self-pay

## 2020-04-21 DIAGNOSIS — Z510 Encounter for antineoplastic radiation therapy: Secondary | ICD-10-CM | POA: Diagnosis not present

## 2020-04-21 DIAGNOSIS — C50511 Malignant neoplasm of lower-outer quadrant of right female breast: Secondary | ICD-10-CM | POA: Diagnosis not present

## 2020-04-22 ENCOUNTER — Encounter: Payer: Self-pay | Admitting: *Deleted

## 2020-04-22 ENCOUNTER — Other Ambulatory Visit: Payer: Self-pay

## 2020-04-22 ENCOUNTER — Ambulatory Visit
Admission: RE | Admit: 2020-04-22 | Discharge: 2020-04-22 | Disposition: A | Discharge status: Home / Self Care | Payer: PPO | Source: Ambulatory Visit | Attending: Radiation Oncology | Admitting: Radiation Oncology

## 2020-04-22 DIAGNOSIS — Z510 Encounter for antineoplastic radiation therapy: Secondary | ICD-10-CM | POA: Diagnosis not present

## 2020-04-22 DIAGNOSIS — C50511 Malignant neoplasm of lower-outer quadrant of right female breast: Secondary | ICD-10-CM | POA: Diagnosis not present

## 2020-04-23 ENCOUNTER — Ambulatory Visit
Admission: RE | Admit: 2020-04-23 | Discharge: 2020-04-23 | Disposition: A | Discharge status: Home / Self Care | Payer: PPO | Source: Ambulatory Visit | Attending: Radiation Oncology | Admitting: Radiation Oncology

## 2020-04-23 ENCOUNTER — Other Ambulatory Visit: Payer: Self-pay

## 2020-04-23 DIAGNOSIS — R42 Dizziness and giddiness: Secondary | ICD-10-CM | POA: Diagnosis not present

## 2020-04-23 DIAGNOSIS — R531 Weakness: Secondary | ICD-10-CM | POA: Diagnosis not present

## 2020-04-23 DIAGNOSIS — C50511 Malignant neoplasm of lower-outer quadrant of right female breast: Secondary | ICD-10-CM | POA: Diagnosis not present

## 2020-04-23 DIAGNOSIS — Z510 Encounter for antineoplastic radiation therapy: Secondary | ICD-10-CM | POA: Diagnosis not present

## 2020-04-24 ENCOUNTER — Ambulatory Visit
Admission: RE | Admit: 2020-04-24 | Discharge: 2020-04-24 | Disposition: A | Discharge status: Home / Self Care | Payer: PPO | Source: Ambulatory Visit | Attending: Radiation Oncology | Admitting: Radiation Oncology

## 2020-04-24 ENCOUNTER — Other Ambulatory Visit: Payer: Self-pay

## 2020-04-24 DIAGNOSIS — C50511 Malignant neoplasm of lower-outer quadrant of right female breast: Secondary | ICD-10-CM | POA: Diagnosis not present

## 2020-04-24 DIAGNOSIS — Z510 Encounter for antineoplastic radiation therapy: Secondary | ICD-10-CM | POA: Diagnosis not present

## 2020-04-25 ENCOUNTER — Ambulatory Visit
Admission: RE | Admit: 2020-04-25 | Discharge: 2020-04-25 | Disposition: A | Discharge status: Home / Self Care | Payer: PPO | Source: Ambulatory Visit | Attending: Radiation Oncology | Admitting: Radiation Oncology

## 2020-04-25 ENCOUNTER — Other Ambulatory Visit: Payer: Self-pay

## 2020-04-25 DIAGNOSIS — C50511 Malignant neoplasm of lower-outer quadrant of right female breast: Secondary | ICD-10-CM | POA: Diagnosis not present

## 2020-04-25 DIAGNOSIS — Z510 Encounter for antineoplastic radiation therapy: Secondary | ICD-10-CM | POA: Diagnosis not present

## 2020-04-25 MED ORDER — ALRA NON-METALLIC DEODORANT (RAD-ONC)
1.0000 "application " | Freq: Once | TOPICAL | Status: AC
  Administered 2020-04-25: 1 via TOPICAL

## 2020-04-25 MED ORDER — SONAFINE EX EMUL
1.0000 "application " | Freq: Two times a day (BID) | CUTANEOUS | Status: DC
  Administered 2020-04-25: 1 via TOPICAL

## 2020-04-28 ENCOUNTER — Ambulatory Visit
Admission: RE | Admit: 2020-04-28 | Discharge: 2020-04-28 | Disposition: A | Discharge status: Home / Self Care | Payer: PPO | Source: Ambulatory Visit | Attending: Radiation Oncology | Admitting: Radiation Oncology

## 2020-04-28 ENCOUNTER — Other Ambulatory Visit: Payer: Self-pay

## 2020-04-28 DIAGNOSIS — C50511 Malignant neoplasm of lower-outer quadrant of right female breast: Secondary | ICD-10-CM | POA: Diagnosis not present

## 2020-04-28 DIAGNOSIS — Z510 Encounter for antineoplastic radiation therapy: Secondary | ICD-10-CM | POA: Diagnosis not present

## 2020-04-29 ENCOUNTER — Ambulatory Visit
Admission: RE | Admit: 2020-04-29 | Discharge: 2020-04-29 | Disposition: A | Discharge status: Home / Self Care | Payer: PPO | Source: Ambulatory Visit | Attending: Radiation Oncology | Admitting: Radiation Oncology

## 2020-04-29 ENCOUNTER — Other Ambulatory Visit: Payer: Self-pay

## 2020-04-29 DIAGNOSIS — C50511 Malignant neoplasm of lower-outer quadrant of right female breast: Secondary | ICD-10-CM | POA: Diagnosis not present

## 2020-04-29 DIAGNOSIS — Z510 Encounter for antineoplastic radiation therapy: Secondary | ICD-10-CM | POA: Diagnosis not present

## 2020-04-30 ENCOUNTER — Other Ambulatory Visit: Payer: Self-pay

## 2020-04-30 ENCOUNTER — Ambulatory Visit
Admission: RE | Admit: 2020-04-30 | Discharge: 2020-04-30 | Disposition: A | Discharge status: Home / Self Care | Payer: PPO | Source: Ambulatory Visit | Attending: Radiation Oncology | Admitting: Radiation Oncology

## 2020-04-30 DIAGNOSIS — C50511 Malignant neoplasm of lower-outer quadrant of right female breast: Secondary | ICD-10-CM | POA: Diagnosis not present

## 2020-04-30 DIAGNOSIS — Z510 Encounter for antineoplastic radiation therapy: Secondary | ICD-10-CM | POA: Diagnosis not present

## 2020-05-01 ENCOUNTER — Ambulatory Visit
Admission: RE | Admit: 2020-05-01 | Discharge: 2020-05-01 | Disposition: A | Discharge status: Home / Self Care | Payer: PPO | Source: Ambulatory Visit | Attending: Radiation Oncology | Admitting: Radiation Oncology

## 2020-05-01 ENCOUNTER — Other Ambulatory Visit: Payer: Self-pay

## 2020-05-01 DIAGNOSIS — C50511 Malignant neoplasm of lower-outer quadrant of right female breast: Secondary | ICD-10-CM | POA: Diagnosis not present

## 2020-05-01 DIAGNOSIS — Z510 Encounter for antineoplastic radiation therapy: Secondary | ICD-10-CM | POA: Diagnosis not present

## 2020-05-02 ENCOUNTER — Other Ambulatory Visit: Payer: Self-pay

## 2020-05-02 ENCOUNTER — Ambulatory Visit
Admission: RE | Admit: 2020-05-02 | Discharge: 2020-05-02 | Disposition: A | Discharge status: Home / Self Care | Payer: PPO | Source: Ambulatory Visit | Attending: Radiation Oncology | Admitting: Radiation Oncology

## 2020-05-02 DIAGNOSIS — Z510 Encounter for antineoplastic radiation therapy: Secondary | ICD-10-CM | POA: Diagnosis not present

## 2020-05-02 DIAGNOSIS — C50511 Malignant neoplasm of lower-outer quadrant of right female breast: Secondary | ICD-10-CM | POA: Diagnosis not present

## 2020-05-05 ENCOUNTER — Ambulatory Visit: Payer: PPO

## 2020-05-05 ENCOUNTER — Other Ambulatory Visit: Payer: Self-pay

## 2020-05-05 ENCOUNTER — Ambulatory Visit
Admission: RE | Admit: 2020-05-05 | Discharge: 2020-05-05 | Disposition: A | Discharge status: Home / Self Care | Payer: PPO | Source: Ambulatory Visit | Attending: Radiation Oncology | Admitting: Radiation Oncology

## 2020-05-05 DIAGNOSIS — C50511 Malignant neoplasm of lower-outer quadrant of right female breast: Secondary | ICD-10-CM | POA: Diagnosis not present

## 2020-05-05 DIAGNOSIS — Z510 Encounter for antineoplastic radiation therapy: Secondary | ICD-10-CM | POA: Diagnosis not present

## 2020-05-06 ENCOUNTER — Ambulatory Visit: Payer: PPO

## 2020-05-06 ENCOUNTER — Ambulatory Visit
Admission: RE | Admit: 2020-05-06 | Discharge: 2020-05-06 | Disposition: A | Discharge status: Home / Self Care | Payer: PPO | Source: Ambulatory Visit | Attending: Radiation Oncology | Admitting: Radiation Oncology

## 2020-05-06 DIAGNOSIS — Z510 Encounter for antineoplastic radiation therapy: Secondary | ICD-10-CM | POA: Diagnosis not present

## 2020-05-06 DIAGNOSIS — C50511 Malignant neoplasm of lower-outer quadrant of right female breast: Secondary | ICD-10-CM | POA: Diagnosis not present

## 2020-05-07 ENCOUNTER — Other Ambulatory Visit: Payer: Self-pay

## 2020-05-07 ENCOUNTER — Ambulatory Visit: Payer: PPO

## 2020-05-07 ENCOUNTER — Ambulatory Visit
Admission: RE | Admit: 2020-05-07 | Discharge: 2020-05-07 | Disposition: A | Discharge status: Home / Self Care | Payer: PPO | Source: Ambulatory Visit | Attending: Radiation Oncology | Admitting: Radiation Oncology

## 2020-05-07 DIAGNOSIS — C50511 Malignant neoplasm of lower-outer quadrant of right female breast: Secondary | ICD-10-CM | POA: Diagnosis not present

## 2020-05-07 DIAGNOSIS — Z510 Encounter for antineoplastic radiation therapy: Secondary | ICD-10-CM | POA: Diagnosis not present

## 2020-05-08 ENCOUNTER — Ambulatory Visit: Payer: PPO

## 2020-05-08 ENCOUNTER — Ambulatory Visit
Admission: RE | Admit: 2020-05-08 | Discharge: 2020-05-08 | Disposition: A | Discharge status: Home / Self Care | Payer: PPO | Source: Ambulatory Visit | Attending: Radiation Oncology | Admitting: Radiation Oncology

## 2020-05-08 DIAGNOSIS — Z510 Encounter for antineoplastic radiation therapy: Secondary | ICD-10-CM | POA: Diagnosis not present

## 2020-05-08 DIAGNOSIS — C50511 Malignant neoplasm of lower-outer quadrant of right female breast: Secondary | ICD-10-CM | POA: Diagnosis not present

## 2020-05-09 ENCOUNTER — Other Ambulatory Visit: Payer: Self-pay

## 2020-05-09 ENCOUNTER — Ambulatory Visit
Admission: RE | Admit: 2020-05-09 | Discharge: 2020-05-09 | Disposition: A | Discharge status: Home / Self Care | Payer: PPO | Source: Ambulatory Visit | Attending: Radiation Oncology | Admitting: Radiation Oncology

## 2020-05-09 ENCOUNTER — Ambulatory Visit: Payer: PPO

## 2020-05-09 ENCOUNTER — Ambulatory Visit: Payer: PPO | Admitting: Radiation Oncology

## 2020-05-09 DIAGNOSIS — C50511 Malignant neoplasm of lower-outer quadrant of right female breast: Secondary | ICD-10-CM | POA: Diagnosis not present

## 2020-05-09 DIAGNOSIS — Z510 Encounter for antineoplastic radiation therapy: Secondary | ICD-10-CM | POA: Diagnosis not present

## 2020-05-11 NOTE — Progress Notes (Signed)
Patient Care Team: Lawerance Cruel, MD as PCP - General (Family Medicine) Dorothy Spark, MD as PCP - Cardiology (Cardiology) Mauro Kaufmann, RN as Oncology Nurse Navigator Rockwell Germany, RN as Oncology Nurse Navigator Nicholas Lose, MD as Consulting Physician (Hematology and Oncology) Jovita Kussmaul, MD as Consulting Physician (General Surgery) Kyung Rudd, MD as Consulting Physician (Radiation Oncology)  DIAGNOSIS:    ICD-10-CM   1. Malignant neoplasm of lower-outer quadrant of right breast of female, estrogen receptor positive ([place])  C50.511    Z17.0     SUMMARY OF ONCOLOGIC HISTORY: Oncology History  Malignant neoplasm of lower-outer quadrant of right breast of female, estrogen receptor positive ([place])  02/15/2020 Initial Diagnosis   Screening mammogram detected right breast mass. 0.4cm mass at the 6:30 position and no axillary adenopathy. Biopsy: invasive ductal carcinoma, grade 3, HER-2 negative (1+), ER+ 95%, PR- 0%, KI67 40%.   03/20/2020 Surgery   Right lumpectomy [name]): invasive and in situ ductal carcinoma, 0.5cm, clear margins, 4 right axillary lymph nodes negative    04/22/2020 -  Radiation Therapy   Adjuvant radiation     CHIEF COMPLIANT: Follow-up of right breast cancer to discuss antiestrogen therapy  INTERVAL HISTORY: MARIELLEN BLANEY is a 79 y.o. with above-mentioned history of right breast cancer who underwent a right lumpectomy and is currently on radiation treatment. She presents to the clinic today to discuss antiestrogen therapy.   ALLERGIES:  has No Known Allergies.  MEDICATIONS:  Current Outpatient Medications  Medication Sig Dispense Refill  . amLODipine-benazepril (LOTREL) 5-40 MG capsule Take 1 capsule by mouth daily.    Marland Kitchen aspirin (ASPIRIN EC) 81 MG EC tablet Take 81 mg by mouth daily. Swallow whole.     . Calcium Carbonate-Vitamin D (CALCIUM 600+D) 600-400 MG-UNIT tablet Take 1 tablet by mouth 3 (three) times a week.     . Coenzyme Q10 (CO  Q 10 PO) Take 200 mg by mouth every other day.     Marland Kitchen doxycycline (VIBRA-TABS) 100 MG tablet Take 1 tablet (100 mg total) by mouth 2 (two) times daily. 10 tablet 0  . ezetimibe (ZETIA) 10 MG tablet Take 1 tablet (10 mg total) by mouth daily. 90 tablet 3  . fexofenadine (ALLEGRA) 180 MG tablet Take 180 mg by mouth daily as needed for allergies or rhinitis.    . Glucosamine-Chondroit-Vit C-Mn (GLUCOSAMINE 1500 COMPLEX PO) Take 1 tablet by mouth every other day.    . hydrochlorothiazide (HYDRODIURIL) 25 MG tablet Take 25 mg by mouth daily.    Marland Kitchen HYDROcodone-acetaminophen (NORCO/VICODIN) 5-325 MG tablet Take 1-2 tablets by mouth every 6 (six) hours as needed for moderate pain or severe pain. 10 tablet 0  . metFORMIN (GLUCOPHAGE) 500 MG tablet Take by mouth daily with breakfast.    . mometasone (NASONEX) 50 MCG/ACT nasal spray Place 2 sprays into the nose 3 (three) times a week.     . Multiple Vitamin (MULTIVITAMIN WITH MINERALS) TABS tablet Take 1 tablet by mouth 3 (three) times a week.     . Omega-3 Fatty Acids (OMEGA 3 PO) Take 1 capsule by mouth 3 (three) times a week.     . potassium chloride (KLOR-CON) 10 MEQ tablet Take 1 tablet (10 mEq total) by mouth daily. 90 tablet 2  . Probiotic Product (ALIGN) 4 MG CAPS Take 4 mg by mouth daily. supplement    . psyllium (HYDROCIL/METAMUCIL) 95 % PACK Take 1 packet by mouth daily.     . rosuvastatin (  CRESTOR) 40 MG tablet Take 1 tablet (40 mg total) by mouth daily. 90 tablet 2  . traMADol (ULTRAM) 50 MG tablet Take 50 mg by mouth every 6 (six) hours as needed for moderate pain.    . TURMERIC PO Take 500 mg by mouth every other day.    . vitamin E 400 UNIT capsule Take 400 Units by mouth every other day.     No current facility-administered medications for this visit.    PHYSICAL EXAMINATION: ECOG PERFORMANCE STATUS: 1 - Symptomatic but completely ambulatory  Vitals:   05/12/20 1440  BP: 124/73  Pulse: 68  Resp: 18  Temp: 98.3 F (36.8 C)  SpO2:  98%   Filed Weights   05/12/20 1440  Weight: 173 lb 14.4 oz (78.9 kg)    LABORATORY DATA:  I have reviewed the data as listed CMP Latest Ref Rng & Units 03/17/2020 02/21/2020 01/01/2020  Glucose 70 - 99 mg/dL 129(H) 110(H) 124(H)  BUN 8 - 23 mg/dL _0 Creatinine 0.44 - 1.00 mg/dL 0.60 0.66 0.76  Sodium 135 - 145 mmol/L 139 141 139  Potassium 3.5 - 5.1 mmol/L 4.0 4.0 4.5  Chloride 98 - 111 mmol/L 104 100 98  CO2 22 - 32 mmol/L _1 Calcium 8.9 - 10.3 mg/dL 9.4 9.9 10.9(H)  Total Protein 6.0 - 8.5 g/dL - 6.7 -  Total Bilirubin 0.0 - 1.2 mg/dL - 0.3 -  Alkaline Phos 39 - 117 IU/L - 72 -  AST 0 - 40 IU/L - 19 -  ALT 0 - 32 IU/L - 13 -    Lab Results  Component Value Date   WBC 7.8 12/19/2019   HGB 13.4 12/19/2019   HCT 41.9 12/19/2019   MCV 94.2 12/19/2019   PLT 326 12/19/2019   NEUTROABS 4.7 12/19/2019    ASSESSMENT & PLAN:  Malignant neoplasm of lower-outer quadrant of right breast of female, estrogen receptor positive ([place]) 02/15/2020:Screening mammogram detected right breast mass. 0.4cm mass at the 6:30 position and no axillary adenopathy. Biopsy: invasive ductal carcinoma, grade 3, HER-2 negative (1+), ER+ 95%, PR- 0%, KI67 40%. T1 a N0 stage Ia clinical stage  03/20/2020:Right lumpectomy [name]): invasive and in situ ductal carcinoma, 0.5cm, clear margins, 4 right axillary lymph nodes negative T1 a N0 stage Ia pathologic stage 04/22/2020-05/12/2020: Adjuvant radiation With radiation she felt slightly depressed to the point that she was crying.  She improved with the counseling and support of her husband.  Treatment plan: Adjuvant antiestrogen therapywith anastrozole 1 mg daily x5 years to start 06/02/2020  Anastrozole counseling:We discussed the risks and benefits of anti-estrogen therapy with aromatase inhibitors. These include but not limited to insomnia, hot flashes, mood changes, vaginal dryness, bone density loss, and weight gain. We strongly believe that the  benefits far outweigh the risks. Patient understands these risks and consented to starting treatment. Planned treatment duration is 5-7 years.  Patient stays very busy with traveling and staying active. Return to clinic in 3 months for survivorship care plan visit After that we can see her once a year.   No orders of the defined types were placed in this encounter.  The patient has a good understanding of the overall plan. she agrees with it. she will call with any problems that may develop before the next visit here.  Total time spent: 30 mins including face to face time and time spent for planning, charting and coordination of care  Nicholas Lose, MD 05/12/2020  I, Molly Dorshimer, am acting as scribe for Dr. Nicholas Lose.  I have reviewed the above documentation for accuracy and completeness, and I agree with the above.

## 2020-05-12 ENCOUNTER — Ambulatory Visit
Admission: RE | Admit: 2020-05-12 | Discharge: 2020-05-12 | Disposition: A | Discharge status: Home / Self Care | Payer: PPO | Source: Ambulatory Visit | Attending: Radiation Oncology | Admitting: Radiation Oncology

## 2020-05-12 ENCOUNTER — Inpatient Hospital Stay: Payer: PPO | Attending: Hematology and Oncology | Admitting: Hematology and Oncology

## 2020-05-12 ENCOUNTER — Other Ambulatory Visit: Payer: Self-pay

## 2020-05-12 DIAGNOSIS — C50511 Malignant neoplasm of lower-outer quadrant of right female breast: Secondary | ICD-10-CM | POA: Insufficient documentation

## 2020-05-12 DIAGNOSIS — Z170 Estrogen receptor positive status [ER+]: Secondary | ICD-10-CM | POA: Insufficient documentation

## 2020-05-12 DIAGNOSIS — Z510 Encounter for antineoplastic radiation therapy: Secondary | ICD-10-CM | POA: Insufficient documentation

## 2020-05-12 DIAGNOSIS — C50911 Malignant neoplasm of unspecified site of right female breast: Secondary | ICD-10-CM | POA: Insufficient documentation

## 2020-05-12 MED ORDER — ANASTROZOLE 1 MG PO TABS
1.0000 mg | ORAL_TABLET | Freq: Every day | ORAL | 3 refills | Status: DC
Start: 2020-05-12 — End: 2021-03-16

## 2020-05-12 NOTE — Assessment & Plan Note (Signed)
02/15/2020:Screening mammogram detected right breast mass. 0.4cm mass at the 6:30 position and no axillary adenopathy. Biopsy: invasive ductal carcinoma, grade 3, HER-2 negative (1+), ER+ 95%, PR- 0%, KI67 40%. T1 a N0 stage Ia clinical stage  03/20/2020:Right lumpectomy [name]): invasive and in situ ductal carcinoma, 0.5cm, clear margins, 4 right axillary lymph nodes negative T1 a N0 stage Ia pathologic stage 04/22/2020-05/12/2020: Adjuvant radiation  Treatment plan: Adjuvant antiestrogen therapywith anastrozole 1 mg daily x5 years  Anastrozole counseling:We discussed the risks and benefits of anti-estrogen therapy with aromatase inhibitors. These include but not limited to insomnia, hot flashes, mood changes, vaginal dryness, bone density loss, and weight gain. We strongly believe that the benefits far outweigh the risks. Patient understands these risks and consented to starting treatment. Planned treatment duration is 5-7 years.  Patient stays very busy with traveling and staying active. Return to clinic in 3 months for survivorship care plan visit

## 2020-05-13 ENCOUNTER — Ambulatory Visit
Admission: RE | Admit: 2020-05-13 | Discharge: 2020-05-13 | Disposition: A | Discharge status: Home / Self Care | Payer: PPO | Source: Ambulatory Visit | Attending: Radiation Oncology | Admitting: Radiation Oncology

## 2020-05-13 DIAGNOSIS — C50511 Malignant neoplasm of lower-outer quadrant of right female breast: Secondary | ICD-10-CM | POA: Diagnosis not present

## 2020-05-13 DIAGNOSIS — Z510 Encounter for antineoplastic radiation therapy: Secondary | ICD-10-CM | POA: Diagnosis not present

## 2020-05-14 ENCOUNTER — Other Ambulatory Visit: Payer: Self-pay

## 2020-05-14 ENCOUNTER — Telehealth: Payer: Self-pay | Admitting: Hematology and Oncology

## 2020-05-14 ENCOUNTER — Ambulatory Visit
Admission: RE | Admit: 2020-05-14 | Discharge: 2020-05-14 | Disposition: A | Discharge status: Home / Self Care | Payer: PPO | Source: Ambulatory Visit | Attending: Radiation Oncology | Admitting: Radiation Oncology

## 2020-05-14 DIAGNOSIS — C50511 Malignant neoplasm of lower-outer quadrant of right female breast: Secondary | ICD-10-CM | POA: Diagnosis not present

## 2020-05-14 DIAGNOSIS — Z510 Encounter for antineoplastic radiation therapy: Secondary | ICD-10-CM | POA: Diagnosis not present

## 2020-05-14 NOTE — Telephone Encounter (Signed)
Scheduled per 8/2 los. Called and left a msg, mailing appt letter and calendar printout

## 2020-05-15 ENCOUNTER — Other Ambulatory Visit: Payer: Self-pay

## 2020-05-15 ENCOUNTER — Ambulatory Visit
Admission: RE | Admit: 2020-05-15 | Discharge: 2020-05-15 | Disposition: A | Discharge status: Home / Self Care | Payer: PPO | Source: Ambulatory Visit | Attending: Radiation Oncology | Admitting: Radiation Oncology

## 2020-05-15 ENCOUNTER — Encounter: Payer: Self-pay | Admitting: *Deleted

## 2020-05-15 DIAGNOSIS — Z510 Encounter for antineoplastic radiation therapy: Secondary | ICD-10-CM | POA: Diagnosis not present

## 2020-05-15 DIAGNOSIS — C50511 Malignant neoplasm of lower-outer quadrant of right female breast: Secondary | ICD-10-CM | POA: Diagnosis not present

## 2020-05-16 ENCOUNTER — Encounter: Payer: Self-pay | Admitting: Radiation Oncology

## 2020-05-16 ENCOUNTER — Ambulatory Visit
Admission: RE | Admit: 2020-05-16 | Discharge: 2020-05-16 | Disposition: A | Discharge status: Home / Self Care | Payer: PPO | Source: Ambulatory Visit | Attending: Radiation Oncology | Admitting: Radiation Oncology

## 2020-05-16 DIAGNOSIS — C50511 Malignant neoplasm of lower-outer quadrant of right female breast: Secondary | ICD-10-CM | POA: Diagnosis not present

## 2020-05-16 DIAGNOSIS — Z510 Encounter for antineoplastic radiation therapy: Secondary | ICD-10-CM | POA: Diagnosis not present

## 2020-06-01 NOTE — Progress Notes (Signed)
  Radiation Oncology         [phone] ________________________________  Name: Andrea Farmer MRN: [id number]  Date: 05/16/2020  DOB: February 03, 1941  End of Treatment Note  Diagnosis:   right-sided breast cancer     Indication for treatment:  Curative       Radiation treatment dates:   04/21/20 - 05/16/20  Site/dose:   The patient initially received a dose of 42.56 Gy in 16 fractions to the breast using whole-breast tangent fields. This was delivered using a 3-D conformal technique. The patient then received a boost to the seroma. This delivered an additional 8 Gy in 17fractions using a 3 field photon technique due to the depth of the seroma. The total dose was 50.56 Gy.  Narrative: The patient tolerated radiation treatment relatively well.   The patient had some expected skin irritation as she progressed during treatment.    Plan: The patient has completed radiation treatment. The patient will return to radiation oncology clinic for routine followup in one month. I advised the patient to call or return sooner if they have any questions or concerns related to their recovery or treatment. ________________________________  Jodelle Gross, M.D., Ph.D.

## 2020-06-18 ENCOUNTER — Telehealth: Payer: Self-pay | Admitting: Radiation Oncology

## 2020-06-18 NOTE — Telephone Encounter (Signed)
  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  Interval Since Last Radiation: 5 weeks   04/21/20 - 05/16/20: The patient initially received a dose of 42.56 Gy in 16 fractions to the breast using whole-breast tangent fields. This was delivered using a 3-D conformal technique. The patient then received a boost to the seroma. This delivered an additional 8 Gy in 36fractions using a 3 field photon technique due to the depth of the seroma. The total dose was 50.56 Gy.  Narrative:  The patient was contacted today for routine follow-up. During treatment she did very well with radiotherapy and did not have significant desquamation. She reports she is doing well and reports her skin is itchy but healing up nicely and she's using vitamin e containing cream.  Impression/Plan: 1. 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 The patient has been doing well since completion of radiotherapy. We discussed that we would be happy to continue to follow her as needed, but she will also continue to follow up with Dr. Lindi Adie in medical oncology. She was counseled on skin care as well as measures to avoid sun exposure to this area.  2. Survivorship. We discussed the importance of survivorship evaluation and encouraged her to attend her upcoming visit with that clinic.    Carola Rhine, PAC

## 2020-06-23 DIAGNOSIS — Z1159 Encounter for screening for other viral diseases: Secondary | ICD-10-CM | POA: Diagnosis not present

## 2020-06-26 DIAGNOSIS — Z86010 Personal history of colonic polyps: Secondary | ICD-10-CM | POA: Diagnosis not present

## 2020-06-26 DIAGNOSIS — K635 Polyp of colon: Secondary | ICD-10-CM | POA: Diagnosis not present

## 2020-06-26 DIAGNOSIS — D123 Benign neoplasm of transverse colon: Secondary | ICD-10-CM | POA: Diagnosis not present

## 2020-07-01 DIAGNOSIS — K635 Polyp of colon: Secondary | ICD-10-CM | POA: Diagnosis not present

## 2020-07-01 DIAGNOSIS — D123 Benign neoplasm of transverse colon: Secondary | ICD-10-CM | POA: Diagnosis not present

## 2020-07-01 DIAGNOSIS — M48061 Spinal stenosis, lumbar region without neurogenic claudication: Secondary | ICD-10-CM | POA: Diagnosis not present

## 2020-07-11 DIAGNOSIS — Z85038 Personal history of other malignant neoplasm of large intestine: Secondary | ICD-10-CM | POA: Diagnosis not present

## 2020-07-11 DIAGNOSIS — I10 Essential (primary) hypertension: Secondary | ICD-10-CM | POA: Diagnosis not present

## 2020-07-11 DIAGNOSIS — E7800 Pure hypercholesterolemia, unspecified: Secondary | ICD-10-CM | POA: Diagnosis not present

## 2020-07-11 DIAGNOSIS — M8580 Other specified disorders of bone density and structure, unspecified site: Secondary | ICD-10-CM | POA: Diagnosis not present

## 2020-07-11 DIAGNOSIS — Z853 Personal history of malignant neoplasm of breast: Secondary | ICD-10-CM | POA: Diagnosis not present

## 2020-07-11 DIAGNOSIS — E1169 Type 2 diabetes mellitus with other specified complication: Secondary | ICD-10-CM | POA: Diagnosis not present

## 2020-07-11 DIAGNOSIS — C50919 Malignant neoplasm of unspecified site of unspecified female breast: Secondary | ICD-10-CM | POA: Diagnosis not present

## 2020-07-11 DIAGNOSIS — M1990 Unspecified osteoarthritis, unspecified site: Secondary | ICD-10-CM | POA: Diagnosis not present

## 2020-07-11 DIAGNOSIS — M179 Osteoarthritis of knee, unspecified: Secondary | ICD-10-CM | POA: Diagnosis not present

## 2020-07-14 DIAGNOSIS — E7800 Pure hypercholesterolemia, unspecified: Secondary | ICD-10-CM | POA: Diagnosis not present

## 2020-07-14 DIAGNOSIS — Z853 Personal history of malignant neoplasm of breast: Secondary | ICD-10-CM | POA: Diagnosis not present

## 2020-07-14 DIAGNOSIS — Z0000 Encounter for general adult medical examination without abnormal findings: Secondary | ICD-10-CM | POA: Diagnosis not present

## 2020-07-14 DIAGNOSIS — Z9189 Other specified personal risk factors, not elsewhere classified: Secondary | ICD-10-CM | POA: Diagnosis not present

## 2020-07-14 DIAGNOSIS — I10 Essential (primary) hypertension: Secondary | ICD-10-CM | POA: Diagnosis not present

## 2020-07-14 DIAGNOSIS — E1169 Type 2 diabetes mellitus with other specified complication: Secondary | ICD-10-CM | POA: Diagnosis not present

## 2020-07-14 DIAGNOSIS — M549 Dorsalgia, unspecified: Secondary | ICD-10-CM | POA: Diagnosis not present

## 2020-07-14 DIAGNOSIS — M1990 Unspecified osteoarthritis, unspecified site: Secondary | ICD-10-CM | POA: Diagnosis not present

## 2020-07-25 DIAGNOSIS — M48061 Spinal stenosis, lumbar region without neurogenic claudication: Secondary | ICD-10-CM | POA: Diagnosis not present

## 2020-07-25 DIAGNOSIS — M47812 Spondylosis without myelopathy or radiculopathy, cervical region: Secondary | ICD-10-CM | POA: Diagnosis not present

## 2020-08-01 DIAGNOSIS — C50511 Malignant neoplasm of lower-outer quadrant of right female breast: Secondary | ICD-10-CM | POA: Diagnosis not present

## 2020-08-01 DIAGNOSIS — Z170 Estrogen receptor positive status [ER+]: Secondary | ICD-10-CM | POA: Diagnosis not present

## 2020-08-06 DIAGNOSIS — G894 Chronic pain syndrome: Secondary | ICD-10-CM | POA: Diagnosis not present

## 2020-08-06 DIAGNOSIS — M48062 Spinal stenosis, lumbar region with neurogenic claudication: Secondary | ICD-10-CM | POA: Diagnosis not present

## 2020-08-06 DIAGNOSIS — I10 Essential (primary) hypertension: Secondary | ICD-10-CM | POA: Diagnosis not present

## 2020-08-06 DIAGNOSIS — Z6829 Body mass index (BMI) 29.0-29.9, adult: Secondary | ICD-10-CM | POA: Diagnosis not present

## 2020-08-13 ENCOUNTER — Encounter: Payer: Self-pay | Admitting: Adult Health

## 2020-08-13 ENCOUNTER — Telehealth: Payer: Self-pay | Admitting: Hematology and Oncology

## 2020-08-13 ENCOUNTER — Inpatient Hospital Stay: Payer: PPO | Attending: Hematology and Oncology | Admitting: Adult Health

## 2020-08-13 ENCOUNTER — Other Ambulatory Visit: Payer: Self-pay

## 2020-08-13 VITALS — BP 128/55 | HR 63 | Temp 97.8°F | Resp 16 | Ht 64.0 in | Wt 175.7 lb

## 2020-08-13 DIAGNOSIS — Z809 Family history of malignant neoplasm, unspecified: Secondary | ICD-10-CM | POA: Diagnosis not present

## 2020-08-13 DIAGNOSIS — Z8719 Personal history of other diseases of the digestive system: Secondary | ICD-10-CM | POA: Diagnosis not present

## 2020-08-13 DIAGNOSIS — C50511 Malignant neoplasm of lower-outer quadrant of right female breast: Secondary | ICD-10-CM

## 2020-08-13 DIAGNOSIS — Z923 Personal history of irradiation: Secondary | ICD-10-CM | POA: Insufficient documentation

## 2020-08-13 DIAGNOSIS — Z8249 Family history of ischemic heart disease and other diseases of the circulatory system: Secondary | ICD-10-CM | POA: Diagnosis not present

## 2020-08-13 DIAGNOSIS — E7800 Pure hypercholesterolemia, unspecified: Secondary | ICD-10-CM | POA: Insufficient documentation

## 2020-08-13 DIAGNOSIS — I10 Essential (primary) hypertension: Secondary | ICD-10-CM | POA: Diagnosis not present

## 2020-08-13 DIAGNOSIS — Z79899 Other long term (current) drug therapy: Secondary | ICD-10-CM | POA: Diagnosis not present

## 2020-08-13 DIAGNOSIS — Z170 Estrogen receptor positive status [ER+]: Secondary | ICD-10-CM | POA: Diagnosis not present

## 2020-08-13 DIAGNOSIS — Z79811 Long term (current) use of aromatase inhibitors: Secondary | ICD-10-CM | POA: Insufficient documentation

## 2020-08-13 NOTE — Progress Notes (Signed)
SURVIVORSHIP VISIT:    BRIEF ONCOLOGIC HISTORY:  Oncology History  Malignant neoplasm of lower-outer quadrant of right breast of female, estrogen receptor positive (HCC)  02/15/2020 Initial Diagnosis   Screening mammogram detected right breast mass. 0.4cm mass at the 6:30 position and no axillary adenopathy. Biopsy: invasive ductal carcinoma, grade 3, HER-2 negative (1+), ER+ 95%, PR- 0%, KI67 40%.   03/20/2020 Surgery   Right lumpectomy Carolynne Edouard) [phone]): invasive and in situ ductal carcinoma, 0.5cm, clear margins, 4 right axillary lymph nodes negative    04/21/2020 - 05/16/2020 Radiation Therapy   The patient initially received a dose of 42.56 Gy in 16 fractions to the breast using whole-breast tangent fields. This was delivered using a 3-D conformal technique. The patient then received a boost to the seroma. This delivered an additional 8 Gy in 32fractions using a 3 field photon technique due to the depth of the seroma. The total dose was 50.56 Gy.   05/2020 - 05/2025 Anti-estrogen oral therapy   Anastrozole     INTERVAL HISTORY:  Andrea Farmer to review her survivorship care plan detailing her treatment course for breast cancer, as well as monitoring long-term side effects of that treatment, education regarding health maintenance, screening, and overall wellness and health promotion.     Overall, Andrea Farmer reports feeling quite well.  She has no concerns today. She is taking anastrozole daily and is tolerating it well.  She denies any new or current issues that she is experiencing.    REVIEW OF SYSTEMS:  Review of Systems  Constitutional: Negative for appetite change, chills, fatigue, fever and unexpected weight change.  HENT:   Negative for hearing loss, lump/mass and trouble swallowing.   Eyes: Negative for eye problems and icterus.  Respiratory: Negative for chest tightness, cough and shortness of breath.   Cardiovascular: Negative for chest pain, leg swelling and palpitations.   Gastrointestinal: Negative for abdominal distention, abdominal pain, constipation, diarrhea, nausea and vomiting.  Endocrine: Negative for hot flashes.  Genitourinary: Negative for difficulty urinating.   Musculoskeletal: Negative for arthralgias.  Skin: Negative for itching and rash.  Neurological: Negative for dizziness, extremity weakness, headaches and numbness.  Hematological: Negative for adenopathy. Does not bruise/bleed easily.  Psychiatric/Behavioral: Negative for depression. The patient is not nervous/anxious.    Breast: Denies any new nodularity, masses, tenderness, nipple changes, or nipple discharge.      ONCOLOGY TREATMENT TEAM:  1. Surgeon:  Dr. Carolynne Edouard at [facility] 2. Medical Oncologist: Dr. Pamelia Hoit  3. Radiation Oncologist: Dr. Mitzi Hansen    PAST MEDICAL/SURGICAL HISTORY:  Past Medical History:  Diagnosis Date  . Acid reflux   . Back pain    arthritis  . Cataract    immature but doesn't know which eye  . Diabetes mellitus without complication (HCC)   . High cholesterol    takes Crestor daily  . History of colon polyps    benign  . History of migraine   . Hypertension    takes Lotrel and HCTZ dailydaily  . Irritable bowel syndrome 03/11/2016  . Joint pain   . Joint swelling   . Osteoarthritis of knee 03/11/2016  . Pneumonia    30 plus yrs ago   Past Surgical History:  Procedure Laterality Date  . ABDOMINAL HYSTERECTOMY    . BREAST BIOPSY Right 05/14/2015  . BREAST EXCISIONAL BIOPSY Left   . BREAST EXCISIONAL BIOPSY Right   . BREAST LUMPECTOMY WITH RADIOACTIVE SEED AND SENTINEL LYMPH NODE BIOPSY Right 03/20/2020   Procedure: RIGHT  BREAST LUMPECTOMY WITH RADIOACTIVE SEED AND SENTINEL LYMPH NODE BIOPSY;  Surgeon: Griselda Miner, MD;  Location: [facility];  Service: General;  Laterality: Right;  . BREAST LUMPECTOMY WITH RADIOACTIVE SEED LOCALIZATION Right 04/27/2019   Procedure: RIGHT BREAST LUMPECTOMY WITH RADIOACTIVE SEED  LOCALIZATION;  Surgeon: Griselda Miner, MD;  Location: [facility];  Service: General;  Laterality: Right;  . RECTAL SURGERY     x 3  . REPLACEMENT TOTAL KNEE BILATERAL    . SYNOVECTOMY Left 07/21/2015   Procedure: LEFT KNEE SYNOVECTOMY with POLY EXCHANGE;  Surgeon: Dannielle Huh, MD;  Location: [facility];  Service: Orthopedics;  Laterality: Left;  . TOTAL KNEE REVISION Right 07/05/2016   Procedure: TOTAL KNEE REVISION WITH SCAR DEBRIDEMENT/PATELLA REVISION WITH POLY EXCHANGE;  Surgeon: Dannielle Huh, MD;  Location: [facility] OR;  Service: Orthopedics;  Laterality: Right;     ALLERGIES:  No Known Allergies   CURRENT MEDICATIONS:  Outpatient Encounter Medications as of 08/13/2020  Medication Sig  . amLODipine-benazepril (LOTREL) 5-40 MG capsule Take 1 capsule by mouth daily.  Marland Kitchen anastrozole (ARIMIDEX) 1 MG tablet Take 1 tablet (1 mg total) by mouth daily.  Marland Kitchen aspirin (ASPIRIN EC) 81 MG EC tablet Take 81 mg by mouth daily. Swallow whole.   . Calcium Carbonate-Vitamin D (CALCIUM 600+D) 600-400 MG-UNIT tablet Take 1 tablet by mouth 3 (three) times a week.   . Coenzyme Q10 (CO Q 10 PO) Take 200 mg by mouth every other day.   Marland Kitchen doxycycline (VIBRA-TABS) 100 MG tablet Take 1 tablet (100 mg total) by mouth 2 (two) times daily.  Marland Kitchen ezetimibe (ZETIA) 10 MG tablet Take 1 tablet (10 mg total) by mouth daily.  . fexofenadine (ALLEGRA) 180 MG tablet Take 180 mg by mouth daily as needed for allergies or rhinitis.  . Glucosamine-Chondroit-Vit C-Mn (GLUCOSAMINE 1500 COMPLEX PO) Take 1 tablet by mouth every other day.  . hydrochlorothiazide (HYDRODIURIL) 25 MG tablet Take 25 mg by mouth daily.  Marland Kitchen HYDROcodone-acetaminophen (NORCO/VICODIN) 5-325 MG tablet Take 1-2 tablets by mouth every 6 (six) hours as needed for moderate pain or severe pain.  . metFORMIN (GLUCOPHAGE) 500 MG tablet Take by mouth daily with breakfast.  . mometasone (NASONEX) 50 MCG/ACT nasal spray Place 2 sprays into the nose 3 (three) times a  week.   . Multiple Vitamin (MULTIVITAMIN WITH MINERALS) TABS tablet Take 1 tablet by mouth 3 (three) times a week.   . Omega-3 Fatty Acids (OMEGA 3 PO) Take 1 capsule by mouth 3 (three) times a week.   . potassium chloride (KLOR-CON) 10 MEQ tablet Take 1 tablet (10 mEq total) by mouth daily.  . Probiotic Product (ALIGN) 4 MG CAPS Take 4 mg by mouth daily. supplement  . psyllium (HYDROCIL/METAMUCIL) 95 % PACK Take 1 packet by mouth daily.   . rosuvastatin (CRESTOR) 40 MG tablet Take 1 tablet (40 mg total) by mouth daily.  . traMADol (ULTRAM) 50 MG tablet Take 50 mg by mouth every 6 (six) hours as needed for moderate pain.  . TURMERIC PO Take 500 mg by mouth every other day.  . vitamin E 400 UNIT capsule Take 400 Units by mouth every other day.   No facility-administered encounter medications on file as of 08/13/2020.     ONCOLOGIC FAMILY HISTORY:  Family History  Problem Relation Age of Onset  . Cancer Mother   . Aneurysm Father      GENETIC COUNSELING/TESTING: Not at this time  SOCIAL HISTORY:  Social History  Socioeconomic History  . Marital status: Media planner    Spouse name: Not on file  . Number of children: Not on file  . Years of education: Not on file  . Highest education level: Not on file  Occupational History  . Not on file  Tobacco Use  . Smoking status: Never Smoker  . Smokeless tobacco: Never Used  Vaping Use  . Vaping Use: Never used  Substance and Sexual Activity  . Alcohol use: Yes    Comment: WINE OCC  . Drug use: No  . Sexual activity: Not on file  Other Topics Concern  . Not on file  Social History Narrative  . Not on file   Social Determinants of Health   Financial Resource Strain:   . Difficulty of Paying Living Expenses: Not on file  Food Insecurity:   . Worried About Programme researcher, broadcasting/film/video in the Last Year: Not on file  . Ran Out of Food in the Last Year: Not on file  Transportation Needs:   . Lack of Transportation (Medical): Not  on file  . Lack of Transportation (Non-Medical): Not on file  Physical Activity:   . Days of Exercise per Week: Not on file  . Minutes of Exercise per Session: Not on file  Stress:   . Feeling of Stress : Not on file  Social Connections:   . Frequency of Communication with Friends and Family: Not on file  . Frequency of Social Gatherings with Friends and Family: Not on file  . Attends Religious Services: Not on file  . Active Member of Clubs or Organizations: Not on file  . Attends Banker Meetings: Not on file  . Marital Status: Not on file  Intimate Partner Violence:   . Fear of Current or Ex-Partner: Not on file  . Emotionally Abused: Not on file  . Physically Abused: Not on file  . Sexually Abused: Not on file     OBSERVATIONS/OBJECTIVE:  BP (!) 128/55 (BP Location: Left Arm, Patient Position: Sitting)   Pulse 63   Temp 97.8 F (36.6 C) (Tympanic)   Resp 16   Ht 5\' 4"  (1.626 m)   Wt 175 lb 11.2 oz (79.7 kg)   SpO2 96%   BMI 30.16 kg/m  GENERAL: Patient is a well appearing female in no acute distress HEENT:  Sclerae anicteric.  Oropharynx clear and moist. No ulcerations or evidence of oropharyngeal candidiasis. Neck is supple.  NODES:  No cervical, supraclavicular, or axillary lymphadenopathy palpated.  BREAST EXAM:  Right breast s/p lumpectomy and radiation, no sign of local recurrence, left breast s/p lumpectomy, benign. LUNGS:  Clear to auscultation bilaterally.  No wheezes or rhonchi. HEART:  Regular rate and rhythm. No murmur appreciated. ABDOMEN:  Soft, nontender.  Positive, normoactive bowel sounds. No organomegaly palpated. MSK:  No focal spinal tenderness to palpation. Full range of motion bilaterally in the upper extremities. EXTREMITIES:  No peripheral edema.   SKIN:  Clear with no obvious rashes or skin changes. No nail dyscrasia. NEURO:  Nonfocal. Well oriented.  Appropriate affect.    LABORATORY DATA:  None for this visit.  DIAGNOSTIC  IMAGING:  None for this visit.      ASSESSMENT AND PLAN:  Ms.. Farmer is a pleasant 79 y.o. female with Stage IA right breast invasive ductal carcinoma, ER+/PR-/HER2-, diagnosed in 02/2020, treated with lumpectomy, adjuvant radiation therapy, and anti-estrogen therapy with Anastrozole beginning in 05/2020.  She presents to the Survivorship Clinic for our initial meeting  and routine follow-up post-completion of treatment for breast cancer.    1. Stage IA right breast cancer:  Andrea Farmer is continuing to recover from definitive treatment for breast cancer. She will follow-up with her medical oncologist, Dr. Lindi Adie in 6 months with history and physical exam per surveillance protocol.  She will continue her anti-estrogen therapy with Anastrozole. Thus far, she is tolerating the Anastrozole well, with minimal side effects. She was instructed to make Dr. Lindi Adie or myself aware if she begins to experience any worsening side effects of the medication and I could see her back in clinic to help manage those side effects, as needed. Her mammogram is due 01/2021. Today, a comprehensive survivorship care plan and treatment summary was reviewed with the patient today detailing her breast cancer diagnosis, treatment course, potential late/long-term effects of treatment, appropriate follow-up care with recommendations for the future, and patient education resources.  A copy of this summary, along with a letter will be sent to the patient's primary care provider via mail/fax/In Basket message after today's visit.    2. Bone health:  Given Andrea Farmer's age/history of breast cancer and her current treatment regimen including anti-estrogen therapy with Anastrozole, she is at risk for bone demineralization.  Her last DEXA scan was 08/16/2019, which showed a T score of -1.6 in the left femur.  In the meantime, she was encouraged to increase her consumption of foods rich in calcium, as well as increase her weight-bearing activities.   She was given education on specific activities to promote bone health.  3. Cancer screening:  Due to Andrea Farmer's history and her age, she should receive screening for skin cancers, colon cancer, and gynecologic cancers.  The information and recommendations are listed on the patient's comprehensive care plan/treatment summary and were reviewed in detail with the patient.    4. Health maintenance and wellness promotion: Andrea Farmer was encouraged to consume 5-7 servings of fruits and vegetables per day. We reviewed the "Nutrition Rainbow" handout, as well as the handout "Take Control of Your Health and Reduce Your Cancer Risk" from the [place].  She was also encouraged to engage in moderate to vigorous exercise for 30 minutes per day most days of the week. We discussed the [employer] fitness program, which is designed for cancer survivors to help them become more physically fit after cancer treatments.  She was instructed to limit her alcohol consumption and continue to abstain from tobacco use.    5. Support services/counseling: It is not uncommon for this period of the patient's cancer care trajectory to be one of many emotions and stressors.  We discussed how this can be increasingly difficult during the times of quarantine and social distancing due to the COVID-19 pandemic.   She was given information regarding our available services and encouraged to contact me with any questions or for help enrolling in any of our support group/programs.    Follow up instructions:    -Return to cancer center for f/u with Dr. Lindi Adie in 6 months  -Mammogram due in 01/2021 -She is welcome to return back to the Survivorship Clinic at any time; no additional follow-up needed at this time.  -Consider referral back to survivorship as a long-term survivor for continued surveillance  The patient was provided an opportunity to ask questions and all were answered. The patient agreed with the plan and  demonstrated an understanding of the instructions.   Total encounter time: 45 minutes*  Wilber Bihari, NP 08/13/20 2:43 PM  Medical Oncology and Hematology [facility] [address] [place], [place] [address] Tel. [phone]    Fax. [phone]  *Total Encounter Time as defined by the Centers for Medicare and Medicaid Services includes, in addition to the face-to-face time of a patient visit (documented in the note above) non-face-to-face time: obtaining and reviewing outside history, ordering and reviewing medications, tests or procedures, care coordination (communications with other health care professionals or caregivers) and documentation in the medical record.  

## 2020-08-13 NOTE — Telephone Encounter (Signed)
Scheduled appointments per 11/3 los. Spoke to patient who is aware of appointment date and time. Scheduled appointment in July because patient will be gone April-June.

## 2020-08-18 ENCOUNTER — Other Ambulatory Visit: Payer: Self-pay | Admitting: Cardiology

## 2020-08-18 DIAGNOSIS — E7800 Pure hypercholesterolemia, unspecified: Secondary | ICD-10-CM

## 2020-08-18 DIAGNOSIS — I2510 Atherosclerotic heart disease of native coronary artery without angina pectoris: Secondary | ICD-10-CM

## 2020-08-18 DIAGNOSIS — I10 Essential (primary) hypertension: Secondary | ICD-10-CM

## 2020-08-18 MED ORDER — ROSUVASTATIN CALCIUM 40 MG PO TABS
40.0000 mg | ORAL_TABLET | Freq: Every day | ORAL | 2 refills | Status: DC
Start: 2020-08-18 — End: 2020-10-02

## 2020-08-18 NOTE — Telephone Encounter (Signed)
rx refill

## 2020-08-28 DIAGNOSIS — E119 Type 2 diabetes mellitus without complications: Secondary | ICD-10-CM | POA: Diagnosis not present

## 2020-08-28 DIAGNOSIS — H524 Presbyopia: Secondary | ICD-10-CM | POA: Diagnosis not present

## 2020-08-28 DIAGNOSIS — Z961 Presence of intraocular lens: Secondary | ICD-10-CM | POA: Diagnosis not present

## 2020-08-28 DIAGNOSIS — H52203 Unspecified astigmatism, bilateral: Secondary | ICD-10-CM | POA: Diagnosis not present

## 2020-09-11 ENCOUNTER — Other Ambulatory Visit: Payer: Self-pay | Admitting: Cardiology

## 2020-09-11 DIAGNOSIS — E876 Hypokalemia: Secondary | ICD-10-CM

## 2020-09-13 DIAGNOSIS — L259 Unspecified contact dermatitis, unspecified cause: Secondary | ICD-10-CM | POA: Diagnosis not present

## 2020-10-02 ENCOUNTER — Other Ambulatory Visit: Payer: Self-pay | Admitting: Cardiology

## 2020-10-02 DIAGNOSIS — E876 Hypokalemia: Secondary | ICD-10-CM

## 2020-10-09 DIAGNOSIS — M48062 Spinal stenosis, lumbar region with neurogenic claudication: Secondary | ICD-10-CM | POA: Diagnosis not present

## 2020-10-13 ENCOUNTER — Other Ambulatory Visit: Payer: Self-pay | Admitting: Cardiology

## 2020-10-13 DIAGNOSIS — I10 Essential (primary) hypertension: Secondary | ICD-10-CM

## 2020-10-13 DIAGNOSIS — E7800 Pure hypercholesterolemia, unspecified: Secondary | ICD-10-CM

## 2020-10-13 DIAGNOSIS — I2510 Atherosclerotic heart disease of native coronary artery without angina pectoris: Secondary | ICD-10-CM

## 2020-10-28 DIAGNOSIS — J069 Acute upper respiratory infection, unspecified: Secondary | ICD-10-CM | POA: Diagnosis not present

## 2020-11-04 DIAGNOSIS — Z09 Encounter for follow-up examination after completed treatment for conditions other than malignant neoplasm: Secondary | ICD-10-CM | POA: Diagnosis not present

## 2020-11-04 DIAGNOSIS — Z87448 Personal history of other diseases of urinary system: Secondary | ICD-10-CM | POA: Diagnosis not present

## 2020-11-20 ENCOUNTER — Telehealth: Payer: Self-pay | Admitting: *Deleted

## 2020-11-20 NOTE — Telephone Encounter (Signed)
   [facility] Pre-operative Risk Assessment    HEARTCARE STAFF: - Please ensure there is not already an duplicate clearance open for this procedure. - Under Visit Info/Reason for Call, type in Other and utilize the format Clearance MM/DD/YY or Clearance TBD. Do not use dashes or single digits. - If request is for dental extraction, please clarify the # of teeth to be extracted.  Request for surgical clearance:  1. What type of surgery is being performed? PERCUTANEOUS LAMINECTOMY INTERBODY LAMINER APPROACH FOR DECOMPRESSION UNDER INDIRECT IMAGE GUIDANCE   2. When is this surgery scheduled? TBD    3. What type of clearance is required (medical clearance vs. Pharmacy clearance to hold med vs. Both)? MEDICAL  4. Are there any medications that need to be held prior to surgery and how long? ASA    5. Practice name and name of physician performing surgery? [facility]; DR. Lenord Carbo   6. What is the office phone number? [phone]   7.   What is the office fax number? [facility]: JESSICA  8.   Anesthesia type (None, local, MAC, general) ? IV SEDATION OR MAC   Julaine Hua 11/20/2020, 9:16 AM  _________________________________________________________________   (provider comments below)

## 2020-11-20 NOTE — Telephone Encounter (Signed)
   Primary Cardiologist: Ena Dawley, MD  Chart reviewed as part of pre-operative protocol coverage. Patient was contacted 11/20/2020 in reference to pre-operative risk assessment for pending surgery as outlined below.  MARIJAYNE RAUTH was last seen on  02/21/20 by Dr .Meda Coffee with history of PVCs, short SVT/NSVT, DM, HLD, HTN, amongst history outlined in chart. Coronary CTA showed moderate CAD with negative FFR for any hemodynamically significant lesions. EF normal in 02/2020. RCRI 0.9% if including CAD but technically don't have to given no hx of MI.   She states surgery was scheduled yesterday and she got there and was turned down, stating she needed clearance. Therefore she already has an appointment scheduled for 11/28/20 to establish care with Dr. Johney Frame at which time clearance can be addressed. Will cc to Dr. Johney Frame to make her aware. The patient is somewhat nervous to meet a new provider and I reassured her she is in excellent hands with Dr. Mamie Nick.  Since patient will be seeing provider for clearance, no separate input needed from APP preop team. Dr. Johney Frame will need to route final recs to requesting number below. Will fax this update to surgeon and remove from box.   Charlie Pitter, PA-C 11/20/2020, 12:20 PM

## 2020-11-25 ENCOUNTER — Encounter (HOSPITAL_COMMUNITY): Payer: Self-pay | Admitting: Internal Medicine

## 2020-11-25 ENCOUNTER — Emergency Department (HOSPITAL_COMMUNITY): Payer: PPO

## 2020-11-25 ENCOUNTER — Other Ambulatory Visit: Payer: Self-pay

## 2020-11-25 ENCOUNTER — Inpatient Hospital Stay (HOSPITAL_COMMUNITY)
Admission: EM | Admit: 2020-11-25 | Discharge: 2020-11-30 | DRG: 481 | Disposition: A | Discharge status: Skilled Nursing Facility | Payer: PPO | Attending: Internal Medicine | Admitting: Internal Medicine

## 2020-11-25 DIAGNOSIS — Z96659 Presence of unspecified artificial knee joint: Secondary | ICD-10-CM | POA: Diagnosis not present

## 2020-11-25 DIAGNOSIS — S72142A Displaced intertrochanteric fracture of left femur, initial encounter for closed fracture: Principal | ICD-10-CM | POA: Diagnosis present

## 2020-11-25 DIAGNOSIS — Z7984 Long term (current) use of oral hypoglycemic drugs: Secondary | ICD-10-CM

## 2020-11-25 DIAGNOSIS — S72002A Fracture of unspecified part of neck of left femur, initial encounter for closed fracture: Secondary | ICD-10-CM | POA: Diagnosis present

## 2020-11-25 DIAGNOSIS — C50511 Malignant neoplasm of lower-outer quadrant of right female breast: Secondary | ICD-10-CM | POA: Diagnosis not present

## 2020-11-25 DIAGNOSIS — K219 Gastro-esophageal reflux disease without esophagitis: Secondary | ICD-10-CM | POA: Diagnosis not present

## 2020-11-25 DIAGNOSIS — I471 Supraventricular tachycardia: Secondary | ICD-10-CM | POA: Diagnosis present

## 2020-11-25 DIAGNOSIS — I2510 Atherosclerotic heart disease of native coronary artery without angina pectoris: Secondary | ICD-10-CM | POA: Diagnosis present

## 2020-11-25 DIAGNOSIS — W19XXXA Unspecified fall, initial encounter: Secondary | ICD-10-CM | POA: Diagnosis not present

## 2020-11-25 DIAGNOSIS — I493 Ventricular premature depolarization: Secondary | ICD-10-CM

## 2020-11-25 DIAGNOSIS — Z79811 Long term (current) use of aromatase inhibitors: Secondary | ICD-10-CM | POA: Diagnosis not present

## 2020-11-25 DIAGNOSIS — E785 Hyperlipidemia, unspecified: Secondary | ICD-10-CM | POA: Diagnosis not present

## 2020-11-25 DIAGNOSIS — Z96653 Presence of artificial knee joint, bilateral: Secondary | ICD-10-CM | POA: Diagnosis present

## 2020-11-25 DIAGNOSIS — M25572 Pain in left ankle and joints of left foot: Secondary | ICD-10-CM | POA: Diagnosis not present

## 2020-11-25 DIAGNOSIS — Z7982 Long term (current) use of aspirin: Secondary | ICD-10-CM

## 2020-11-25 DIAGNOSIS — M62838 Other muscle spasm: Secondary | ICD-10-CM | POA: Diagnosis not present

## 2020-11-25 DIAGNOSIS — I10 Essential (primary) hypertension: Secondary | ICD-10-CM | POA: Diagnosis present

## 2020-11-25 DIAGNOSIS — Z170 Estrogen receptor positive status [ER+]: Secondary | ICD-10-CM | POA: Diagnosis not present

## 2020-11-25 DIAGNOSIS — Z20822 Contact with and (suspected) exposure to covid-19: Secondary | ICD-10-CM | POA: Diagnosis present

## 2020-11-25 DIAGNOSIS — Z6830 Body mass index (BMI) 30.0-30.9, adult: Secondary | ICD-10-CM

## 2020-11-25 DIAGNOSIS — W010XXA Fall on same level from slipping, tripping and stumbling without subsequent striking against object, initial encounter: Secondary | ICD-10-CM | POA: Diagnosis present

## 2020-11-25 DIAGNOSIS — J984 Other disorders of lung: Secondary | ICD-10-CM | POA: Diagnosis not present

## 2020-11-25 DIAGNOSIS — Z90710 Acquired absence of both cervix and uterus: Secondary | ICD-10-CM

## 2020-11-25 DIAGNOSIS — E876 Hypokalemia: Secondary | ICD-10-CM

## 2020-11-25 DIAGNOSIS — M25551 Pain in right hip: Secondary | ICD-10-CM | POA: Diagnosis not present

## 2020-11-25 DIAGNOSIS — D508 Other iron deficiency anemias: Secondary | ICD-10-CM | POA: Diagnosis present

## 2020-11-25 DIAGNOSIS — D62 Acute posthemorrhagic anemia: Secondary | ICD-10-CM | POA: Diagnosis not present

## 2020-11-25 DIAGNOSIS — Y92008 Other place in unspecified non-institutional (private) residence as the place of occurrence of the external cause: Secondary | ICD-10-CM | POA: Diagnosis not present

## 2020-11-25 DIAGNOSIS — S7292XD Unspecified fracture of left femur, subsequent encounter for closed fracture with routine healing: Secondary | ICD-10-CM | POA: Diagnosis not present

## 2020-11-25 DIAGNOSIS — E7800 Pure hypercholesterolemia, unspecified: Secondary | ICD-10-CM | POA: Diagnosis not present

## 2020-11-25 DIAGNOSIS — S72002D Fracture of unspecified part of neck of left femur, subsequent encounter for closed fracture with routine healing: Secondary | ICD-10-CM | POA: Diagnosis not present

## 2020-11-25 DIAGNOSIS — E119 Type 2 diabetes mellitus without complications: Secondary | ICD-10-CM | POA: Diagnosis present

## 2020-11-25 DIAGNOSIS — W19XXXD Unspecified fall, subsequent encounter: Secondary | ICD-10-CM | POA: Diagnosis not present

## 2020-11-25 DIAGNOSIS — H04123 Dry eye syndrome of bilateral lacrimal glands: Secondary | ICD-10-CM | POA: Diagnosis not present

## 2020-11-25 DIAGNOSIS — Z419 Encounter for procedure for purposes other than remedying health state, unspecified: Secondary | ICD-10-CM

## 2020-11-25 DIAGNOSIS — Z79899 Other long term (current) drug therapy: Secondary | ICD-10-CM

## 2020-11-25 DIAGNOSIS — R52 Pain, unspecified: Secondary | ICD-10-CM | POA: Diagnosis not present

## 2020-11-25 DIAGNOSIS — Z5189 Encounter for other specified aftercare: Secondary | ICD-10-CM | POA: Diagnosis not present

## 2020-11-25 DIAGNOSIS — Z01810 Encounter for preprocedural cardiovascular examination: Secondary | ICD-10-CM | POA: Diagnosis not present

## 2020-11-25 DIAGNOSIS — K5900 Constipation, unspecified: Secondary | ICD-10-CM | POA: Diagnosis not present

## 2020-11-25 DIAGNOSIS — Z4789 Encounter for other orthopedic aftercare: Secondary | ICD-10-CM | POA: Diagnosis not present

## 2020-11-25 DIAGNOSIS — J3089 Other allergic rhinitis: Secondary | ICD-10-CM | POA: Diagnosis not present

## 2020-11-25 DIAGNOSIS — Z043 Encounter for examination and observation following other accident: Secondary | ICD-10-CM | POA: Diagnosis not present

## 2020-11-25 DIAGNOSIS — M25552 Pain in left hip: Secondary | ICD-10-CM | POA: Diagnosis not present

## 2020-11-25 LAB — CBC WITH DIFFERENTIAL/PLATELET
Abs Immature Granulocytes: 0.05 10*3/uL (ref 0.00–0.07)
Basophils Absolute: 0 10*3/uL (ref 0.0–0.1)
Basophils Relative: 0 %
Eosinophils Absolute: 0 10*3/uL (ref 0.0–0.5)
Eosinophils Relative: 0 %
HCT: 37.7 % (ref 36.0–46.0)
Hemoglobin: 12.3 g/dL (ref 12.0–15.0)
Immature Granulocytes: 1 %
Lymphocytes Relative: 14 %
Lymphs Abs: 1.4 10*3/uL (ref 0.7–4.0)
MCH: 30.7 pg (ref 26.0–34.0)
MCHC: 32.6 g/dL (ref 30.0–36.0)
MCV: 94 fL (ref 80.0–100.0)
Monocytes Absolute: 0.8 10*3/uL (ref 0.1–1.0)
Monocytes Relative: 8 %
Neutro Abs: 8 10*3/uL — ABNORMAL HIGH (ref 1.7–7.7)
Neutrophils Relative %: 77 %
Platelets: 255 10*3/uL (ref 150–400)
RBC: 4.01 MIL/uL (ref 3.87–5.11)
RDW: 13.3 % (ref 11.5–15.5)
WBC: 10.3 10*3/uL (ref 4.0–10.5)
nRBC: 0 % (ref 0.0–0.2)

## 2020-11-25 LAB — GLUCOSE, CAPILLARY: Glucose-Capillary: 115 mg/dL — ABNORMAL HIGH (ref 70–99)

## 2020-11-25 LAB — RESP PANEL BY RT-PCR (FLU A&B, COVID) ARPGX2
Influenza A by PCR: NEGATIVE
Influenza B by PCR: NEGATIVE
SARS Coronavirus 2 by RT PCR: NEGATIVE

## 2020-11-25 LAB — COMPREHENSIVE METABOLIC PANEL
ALT: 19 U/L (ref 0–44)
AST: 24 U/L (ref 15–41)
Albumin: 4.5 g/dL (ref 3.5–5.0)
Alkaline Phosphatase: 62 U/L (ref 38–126)
Anion gap: 11 (ref 5–15)
BUN: 21 mg/dL (ref 8–23)
CO2: 27 mmol/L (ref 22–32)
Calcium: 9.5 mg/dL (ref 8.9–10.3)
Chloride: 102 mmol/L (ref 98–111)
Creatinine, Ser: 0.59 mg/dL (ref 0.44–1.00)
GFR, Estimated: >60 mL/min (ref >60)
Glucose, Bld: 152 mg/dL — ABNORMAL HIGH (ref 70–99)
Potassium: 3.1 mmol/L — ABNORMAL LOW (ref 3.5–5.1)
Sodium: 140 mmol/L (ref 135–145)
Total Bilirubin: 0.5 mg/dL (ref 0.3–1.2)
Total Protein: 7.6 g/dL (ref 6.5–8.1)

## 2020-11-25 LAB — SURGICAL PCR SCREEN
MRSA, PCR: NEGATIVE
Staphylococcus aureus: NEGATIVE

## 2020-11-25 MED ORDER — HYDROCODONE-ACETAMINOPHEN 5-325 MG PO TABS
1.0000 | ORAL_TABLET | Freq: Four times a day (QID) | ORAL | Status: DC | PRN
  Administered 2020-11-25 – 2020-11-26 (×3): 2 via ORAL
  Filled 2020-11-25 (×4): qty 2

## 2020-11-25 MED ORDER — INSULIN ASPART 100 UNIT/ML ~~LOC~~ SOLN
0.0000 [IU] | Freq: Three times a day (TID) | SUBCUTANEOUS | Status: DC
  Administered 2020-11-27: 2 [IU] via SUBCUTANEOUS
  Administered 2020-11-27: 3 [IU] via SUBCUTANEOUS
  Administered 2020-11-28: 2 [IU] via SUBCUTANEOUS
  Administered 2020-11-28: 5 [IU] via SUBCUTANEOUS
  Administered 2020-11-29: 1 [IU] via SUBCUTANEOUS
  Administered 2020-11-29: 2 [IU] via SUBCUTANEOUS
  Filled 2020-11-25: qty 0.09

## 2020-11-25 MED ORDER — HEPARIN SODIUM (PORCINE) 5000 UNIT/ML IJ SOLN
5000.0000 [IU] | Freq: Three times a day (TID) | INTRAMUSCULAR | Status: DC
  Administered 2020-11-25: 5000 [IU] via SUBCUTANEOUS
  Filled 2020-11-25: qty 1

## 2020-11-25 MED ORDER — METOPROLOL TARTRATE 5 MG/5ML IV SOLN: 5.0000 mg | Freq: Three times a day (TID) | INTRAVENOUS | Status: DC | PRN

## 2020-11-25 MED ORDER — POTASSIUM CHLORIDE CRYS ER 20 MEQ PO TBCR
40.0000 meq | EXTENDED_RELEASE_TABLET | Freq: Once | ORAL | Status: AC
  Administered 2020-11-25: 40 meq via ORAL
  Filled 2020-11-25: qty 2

## 2020-11-25 MED ORDER — ONDANSETRON HCL 4 MG/2ML IJ SOLN
4.0000 mg | Freq: Once | INTRAMUSCULAR | Status: AC
  Administered 2020-11-25: 4 mg via INTRAVENOUS
  Filled 2020-11-25: qty 2

## 2020-11-25 MED ORDER — AMLODIPINE BESY-BENAZEPRIL HCL 5-40 MG PO CAPS: 1.0000 | ORAL_CAPSULE | Freq: Every day | ORAL | Status: DC

## 2020-11-25 MED ORDER — AMLODIPINE BESYLATE 5 MG PO TABS
5.0000 mg | ORAL_TABLET | Freq: Every day | ORAL | Status: DC
  Administered 2020-11-27: 5 mg via ORAL
  Filled 2020-11-25: qty 1

## 2020-11-25 MED ORDER — BENAZEPRIL HCL 10 MG PO TABS
40.0000 mg | ORAL_TABLET | Freq: Every day | ORAL | Status: DC
  Administered 2020-11-27: 40 mg via ORAL
  Filled 2020-11-25: qty 2
  Filled 2020-11-25: qty 4

## 2020-11-25 MED ORDER — EZETIMIBE 10 MG PO TABS
10.0000 mg | ORAL_TABLET | Freq: Every day | ORAL | Status: DC
  Administered 2020-11-25 – 2020-11-30 (×5): 10 mg via ORAL
  Filled 2020-11-25 (×5): qty 1

## 2020-11-25 MED ORDER — HYDROMORPHONE HCL 1 MG/ML IJ SOLN
0.5000 mg | Freq: Once | INTRAMUSCULAR | Status: AC
  Administered 2020-11-25: 0.5 mg via INTRAVENOUS
  Filled 2020-11-25: qty 1

## 2020-11-25 MED ORDER — ROSUVASTATIN CALCIUM 20 MG PO TABS
40.0000 mg | ORAL_TABLET | Freq: Every day | ORAL | Status: DC
  Administered 2020-11-25 – 2020-11-30 (×5): 40 mg via ORAL
  Filled 2020-11-25 (×5): qty 2

## 2020-11-25 MED ORDER — MORPHINE SULFATE (PF) 2 MG/ML IV SOLN
0.5000 mg | INTRAVENOUS | Status: DC | PRN
  Administered 2020-11-25 – 2020-11-26 (×3): 0.5 mg via INTRAVENOUS
  Filled 2020-11-25 (×3): qty 1

## 2020-11-25 MED ORDER — MUPIROCIN 2 % EX OINT
1.0000 "application " | TOPICAL_OINTMENT | Freq: Two times a day (BID) | CUTANEOUS | Status: AC
  Administered 2020-11-25 – 2020-11-30 (×10): 1 via NASAL
  Filled 2020-11-25 (×2): qty 22

## 2020-11-25 MED ORDER — METOPROLOL SUCCINATE ER 25 MG PO TB24
25.0000 mg | ORAL_TABLET | Freq: Every day | ORAL | Status: DC
Start: 2020-11-25 — End: 2020-11-29
  Administered 2020-11-26 – 2020-11-29 (×4): 25 mg via ORAL
  Filled 2020-11-25 (×4): qty 1

## 2020-11-25 NOTE — ED Provider Notes (Signed)
[facility] DEPT Provider Note   CSN: [id number] Arrival date & time: 11/25/20  3295     History Chief Complaint  Patient presents with  . Hip Pain    Andrea Farmer is a 80 y.o. female.  80 year old female with history of HTN, DM, hyperlipidemia, brought in by EMS with left hip pain after a mechanical fall today. Patient states she was walking when her right foot caught the leg of the furniture and caused her to fall, landing on her left side resulting in left hip pain. Reports prior knee replacements and has a difficult time standing so she called 911. Did not hit head, no LOC, is not anticoagulated. No other injuries. Last ate at 8:30AM.        Past Medical History:  Diagnosis Date  . Acid reflux   . Back pain    arthritis  . Cataract    immature but doesn't know which eye  . Diabetes mellitus without complication ([place])   . High cholesterol    takes Crestor daily  . History of colon polyps    benign  . History of migraine   . Hypertension    takes Lotrel and HCTZ dailydaily  . Irritable bowel syndrome 03/11/2016  . Joint pain   . Joint swelling   . Osteoarthritis of knee 03/11/2016  . Pneumonia    30 plus yrs ago    Patient Active Problem List   Diagnosis Date Noted  . Closed left hip fracture, initial encounter ([place]) 11/25/2020  . Malignant neoplasm of lower-outer quadrant of right breast of female, estrogen receptor positive ([place]) 03/12/2020  . Abnormal computed tomography angiography (CTA) 04/08/2016  . Abnormal stress test 04/02/2016  . Cardiac ischemia 04/02/2016  . Pure hypercholesterolemia 03/11/2016  . Abdominal distension (gaseous) 03/11/2016  . Abdominal pain, epigastric 03/11/2016  . Environmental and seasonal allergies 03/11/2016  . Anal or rectal pain 03/11/2016  . Dyspepsia 03/11/2016  . Other abnormal glucose 03/11/2016  . Essential (primary) hypertension 03/11/2016  . Cardiac arrhythmia 03/11/2016  . Irritable  bowel syndrome 03/11/2016  . Osteoarthritis of knee 03/11/2016  . Osteopenia 03/11/2016  . Pain in joint, lower leg 03/11/2016  . History of colonic polyps 03/11/2016  . Slow transit constipation 03/11/2016  . PVC (premature ventricular contraction) 03/11/2016  . SOB (shortness of breath) 03/11/2016  . 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 Hemorrhoid 12/07/2010    Past Surgical History:  Procedure Laterality Date  . ABDOMINAL HYSTERECTOMY    . BREAST BIOPSY Right 05/14/2015  . BREAST EXCISIONAL BIOPSY Left   . BREAST EXCISIONAL BIOPSY Right   . BREAST LUMPECTOMY WITH RADIOACTIVE SEED AND SENTINEL LYMPH NODE BIOPSY Right 03/20/2020   Procedure: RIGHT BREAST LUMPECTOMY WITH RADIOACTIVE SEED AND SENTINEL LYMPH NODE BIOPSY;  Surgeon: Jovita Kussmaul, MD;  Location: [facility];  Service: General;  Laterality: Right;  . BREAST LUMPECTOMY WITH RADIOACTIVE SEED LOCALIZATION Right 04/27/2019   Procedure: RIGHT BREAST LUMPECTOMY WITH RADIOACTIVE SEED LOCALIZATION;  Surgeon: Jovita Kussmaul, MD;  Location: [facility];  Service: General;  Laterality: Right;  . RECTAL SURGERY     x 3  . REPLACEMENT TOTAL KNEE BILATERAL    . SYNOVECTOMY Left 07/21/2015   Procedure: LEFT KNEE SYNOVECTOMY with POLY EXCHANGE;  Surgeon: Vickey Huger, MD;  Location: [facility];  Service: Orthopedics;  Laterality: Left;  . TOTAL KNEE REVISION Right 07/05/2016  Procedure: TOTAL KNEE REVISION WITH SCAR DEBRIDEMENT/PATELLA REVISION WITH POLY EXCHANGE;  Surgeon: Vickey Huger, MD;  Location: [facility];  Service: Orthopedics;  Laterality: Right;     OB History   No obstetric history on file.     Family History  Problem Relation Age of Onset  . Cancer Mother   . Aneurysm Father     Social History   Tobacco Use  . Smoking status: Never Smoker  . Smokeless tobacco: Never Used  Vaping Use  . Vaping Use: Never used  Substance Use Topics  . Alcohol use:  Yes    Comment: WINE OCC  . Drug use: No    Home Medications Prior to Admission medications   Medication Sig Start Date End Date Taking? Authorizing Provider  amLODipine-benazepril (LOTREL) 5-40 MG capsule Take 1 capsule by mouth daily.    [provider]  anastrozole (ARIMIDEX) 1 MG tablet Take 1 tablet (1 mg total) by mouth daily. 05/12/20   Nicholas Lose, MD  aspirin (ASPIRIN EC) 81 MG EC tablet Take 81 mg by mouth daily. Swallow whole.     [provider]  Calcium Carbonate-Vitamin D (CALCIUM 600+D) 600-400 MG-UNIT tablet Take 1 tablet by mouth 3 (three) times a week.     [provider]  Coenzyme Q10 (CO Q 10 PO) Take 200 mg by mouth every other day.     [provider]  doxycycline (VIBRA-TABS) 100 MG tablet Take 1 tablet (100 mg total) by mouth 2 (two) times daily. 04/10/20   Hayden Pedro, PA-C  ezetimibe (ZETIA) 10 MG tablet Take 1 tablet (10 mg total) by mouth daily. FOLLOW UP DUE IN MAY 2022. PLEASE CALL AND SCHEDULE 10/13/20   Dorothy Spark, MD  fexofenadine (ALLEGRA) 180 MG tablet Take 180 mg by mouth daily as needed for allergies or rhinitis.    [provider]  Glucosamine-Chondroit-Vit C-Mn (GLUCOSAMINE 1500 COMPLEX PO) Take 1 tablet by mouth every other day.    [provider]  hydrochlorothiazide (HYDRODIURIL) 25 MG tablet Take 25 mg by mouth daily.    [provider]  HYDROcodone-acetaminophen (NORCO/VICODIN) 5-325 MG tablet Take 1-2 tablets by mouth every 6 (six) hours as needed for moderate pain or severe pain. 03/20/20   Jovita Kussmaul, MD  metFORMIN (GLUCOPHAGE) 500 MG tablet Take by mouth daily with breakfast.    [provider]  mometasone (NASONEX) 50 MCG/ACT nasal spray Place 2 sprays into the nose 3 (three) times a week.     [provider]  Multiple Vitamin (MULTIVITAMIN WITH MINERALS) TABS tablet Take 1 tablet by mouth 3 (three) times a week.     [provider]   Omega-3 Fatty Acids (OMEGA 3 PO) Take 1 capsule by mouth 3 (three) times a week.     [provider]  potassium chloride (KLOR-CON) 10 MEQ tablet TAKE ONE TABLET BY MOUTH ONCE DAILY 10/02/20   Dorothy Spark, MD  Probiotic Product (ALIGN) 4 MG CAPS Take 4 mg by mouth daily. supplement    [provider]  psyllium (HYDROCIL/METAMUCIL) 95 % PACK Take 1 packet by mouth daily.     [provider]  rosuvastatin (CRESTOR) 40 MG tablet TAKE ONE TABLET BY MOUTH ONCE DAILY 10/02/20   Dorothy Spark, MD  traMADol (ULTRAM) 50 MG tablet Take 50 mg by mouth every 6 (six) hours as needed for moderate pain.    [provider]  TURMERIC PO Take 500 mg by mouth every  other day.    [provider]  vitamin E 400 UNIT capsule Take 400 Units by mouth every other day.    [provider]    Allergies    Patient has no known allergies.  Review of Systems   Review of Systems  Constitutional: Negative for fever.  Musculoskeletal: Positive for arthralgias and gait problem. Negative for back pain and neck pain.  Skin: Negative for rash and wound.  Neurological: Negative for dizziness, weakness, light-headedness and numbness.  Hematological: Does not bruise/bleed easily.  Psychiatric/Behavioral: Negative for confusion.  All other systems reviewed and are negative.   Physical Exam Updated Vital Signs BP (!) 119/92   Pulse 71   Temp 97.7 F (36.5 C) (Oral)   Resp 20   SpO2 98%   Physical Exam Vitals and nursing note reviewed.  Constitutional:      General: She is not in acute distress.    Appearance: She is well-developed and well-nourished. She is not diaphoretic.  HENT:     Head: Normocephalic and atraumatic.  Cardiovascular:     Rate and Rhythm: Normal rate and regular rhythm.     Pulses: Normal pulses.     Heart sounds: Normal heart sounds.  Pulmonary:     Effort: Pulmonary effort is normal.     Breath sounds: Normal breath sounds.   Abdominal:     Palpations: Abdomen is soft.     Tenderness: There is no abdominal tenderness.  Musculoskeletal:        General: Tenderness and deformity present.     Comments: Left leg shortened and externally rotated. DP pulse present, sensation intact.   Neurological:     Mental Status: She is alert and oriented to person, place, and time.     Sensory: No sensory deficit.  Psychiatric:        Mood and Affect: Mood and affect normal.        Behavior: Behavior normal.     ED Results / Procedures / Treatments   Labs (all labs ordered are listed, but only abnormal results are displayed) Labs Reviewed  COMPREHENSIVE METABOLIC PANEL - Abnormal; Notable for the following components:      Result Value   Potassium 3.1 (*)    Glucose, Bld 152 (*)    All other components within normal limits  CBC WITH DIFFERENTIAL/PLATELET - Abnormal; Notable for the following components:   Neutro Abs 8.0 (*)    All other components within normal limits  RESP PANEL BY RT-PCR (FLU A&B, COVID) [id number]    EKG EKG Interpretation  Date/Time:  Tuesday November 25 2020 11:17:10 EST Ventricular Rate:  64 PR Interval:    QRS Duration: 115 QT Interval:  451 QTC Calculation: 466 R Axis:   -43 Text Interpretation: Sinus rhythm with first degree AV block Nonspecific IVCD with LAD Since last tracing PR interval has increased Confirmed by Daleen Bo [phone]) on 11/25/2020 12:08:14 PM   Radiology DG Chest 1 View  Result Date: 11/25/2020 CLINICAL DATA:  LEFT hip fracture post fall EXAM: CHEST  1 VIEW COMPARISON:  12/19/2019 FINDINGS: Upper normal heart size. Mediastinal contours and pulmonary vascularity normal. Focal opacity lateral RIGHT upper lobe question infiltrate, subtle mass, or sclerosis in the RIGHT scapula. Lungs otherwise clear. No infiltrate, pleural effusion, or pneumothorax. Surgical clips RIGHT axilla. No additional osseous findings. IMPRESSION: RIGHT upper lobe opacity which could represent  RIGHT upper lobe infiltrate, RIGHT upper lobe nodule/mass, or sclerosis in the RIGHT scapula; this could be assessed  by follow-up CT when clinically able. Electronically Signed   By: Lavonia Dana M.D.   On: 11/25/2020 10:22   DG Hip Unilat With Pelvis 2-3 Views Left  Result Date: 11/25/2020 CLINICAL DATA:  Tripped over a table leg at work and fell landing on LEFT side, LEFT hip pain EXAM: DG HIP (WITH OR WITHOUT PELVIS) 2-3V LEFT COMPARISON:  None FINDINGS: Osseous demineralization. Hip and SI joint spaces preserved. Displaced intertrochanteric fracture LEFT femur. No dislocation. Pelvis intact. IMPRESSION: Displaced intertrochanteric fracture LEFT femur. Electronically Signed   By: Lavonia Dana M.D.   On: 11/25/2020 10:19    Procedures Procedures   Medications Ordered in ED Medications  HYDROmorphone (DILAUDID) injection 0.5 mg (0.5 mg Intravenous Given 11/25/20 1148)  ondansetron (ZOFRAN) injection 4 mg (4 mg Intravenous Given 11/25/20 1147)  potassium chloride SA (KLOR-CON) CR tablet 40 mEq (40 mEq Oral Given 11/25/20 1153)    ED Course  I have reviewed the triage vital signs and the nursing notes.  Pertinent labs & imaging results that were available during my care of the patient were reviewed by me and considered in my medical decision making (see chart for details).  Clinical Course as of 11/25/20 1217  Tue Nov 25, 2445  963 80 year old female from home with left hip pain after a mechanical fall today. On exam, has shortening and external rotation of the left leg, does not tolerate ROM of left hip secondary to pain. DP pulse present, sensation intact.  XR shows displaced left intratrochanteric hip fracture. Patient last ate at 830 this morning.  Advised she is n.p.o., discussed x-ray findings. Plan is to consult Ortho, will order blood work and Covid test for medical clearance. [LM]  1058 Discussed with Dr. Alvan Dame with orthopedics, patient may eat, pending medical clearance for surgery  planning. [LM]  1150 Discussed with Dr. Neysa Bonito with [facility] who will consult for admission and medical clearance.  [LM]  5284 Labs reviewed CBC without significant findings, CMP with mild hypokalemia, K 3.1, given PO Kdur. COVID/flu negative.  [LM]    Clinical Course User Index [LM] Roque Lias   MDM Rules/Calculators/A&P                          Final Clinical Impression(s) / ED Diagnoses Final diagnoses:  Closed left hip fracture, initial encounter [facility])  Hypokalemia    Rx / DC Orders ED Discharge Orders    None       Roque Lias 11/25/20 1217    Daleen Bo, MD 11/25/20 1615

## 2020-11-25 NOTE — ED Triage Notes (Addendum)
Patient transported by [facility] from home-- experienced a mechanical fall this morning injuring left hip. + shortened and externally rotated. EMS reports patient did hit her head but patient denies blood thinner use. EMS administered 100 mcg of Fentanyl PTA and patient upon arrival reports pain 3/10.  VSS: BP 140/70 HR 70 RR 16 CBG 190 95 % O2 on RA

## 2020-11-25 NOTE — Progress Notes (Signed)
We will plan on surgery tomorrow afternoon/PM. Cleared by Cardiology. Patient may eat tonight, and NPO after midnight. Continue pain management.   Griffith Citron, PA-C Orthopedic Surgery [facility] [phone]

## 2020-11-25 NOTE — ED Notes (Signed)
Purewick placed on patient.

## 2020-11-25 NOTE — Consult Note (Signed)
ORTHOPAEDIC CONSULTATION  REQUESTING PHYSICIAN: Harold Hedge, MD  PCP:  Lawerance Cruel, MD  Chief Complaint: Fall, left hip pain   HPI: Andrea Farmer is a 80 y.o. female with a history of DM, breast cancer, HTN, who presented to [facility] ED on 11/25/20 after a fall this morning at home. She reports she was walking in her house when her foot caught on a piece of furniture causing her to fall onto her left side. She felt pain in the hip, and was unable to get up with help from her husband. She denies head injury or LOC. They called EMS and she was transported to [facility]. Imaging in the ED revealed left intertrochanteric femur fracture and Dr. Alvan Dame was consulted for orthopaedic management.  Today, she is resting in bed in the ED comfortably. She has a history of right total knee arthroplasty revision by Dr. Ronnie Derby in 2017. She lives at home with her husband, and typically ambulates without assistance. She has had a recent back issue with radiculopathy and has used a cane/walker at times due to pain. She was scheduled for back surgery with Dr. Davy Pique within the past several weeks, which she states was cancelled by Anesthesia due to cardiac concerns. She states she was cleared to have a breast procedure this summer. She has history of PVCs, SVT, and HTN and is scheduled for an appointment with a new Cardiologist, Dr. Johney Frame, on 11/28/20. She is not on anticoagulants. Her last meal was at 8:30 AM.   Past Medical History:  Diagnosis Date  . Acid reflux   . Back pain    arthritis  . Cataract    immature but doesn't know which eye  . Diabetes mellitus without complication ([place])   . High cholesterol    takes Crestor daily  . History of colon polyps    benign  . History of migraine   . Hypertension    takes Lotrel and HCTZ dailydaily  . Irritable bowel syndrome 03/11/2016  . Joint pain   . Joint swelling   . Osteoarthritis of knee 03/11/2016  . Pneumonia    30 plus yrs ago   Past  Surgical History:  Procedure Laterality Date  . ABDOMINAL HYSTERECTOMY    . BREAST BIOPSY Right 05/14/2015  . BREAST EXCISIONAL BIOPSY Left   . BREAST EXCISIONAL BIOPSY Right   . BREAST LUMPECTOMY WITH RADIOACTIVE SEED AND SENTINEL LYMPH NODE BIOPSY Right 03/20/2020   Procedure: RIGHT BREAST LUMPECTOMY WITH RADIOACTIVE SEED AND SENTINEL LYMPH NODE BIOPSY;  Surgeon: Jovita Kussmaul, MD;  Location: [facility];  Service: General;  Laterality: Right;  . BREAST LUMPECTOMY WITH RADIOACTIVE SEED LOCALIZATION Right 04/27/2019   Procedure: RIGHT BREAST LUMPECTOMY WITH RADIOACTIVE SEED LOCALIZATION;  Surgeon: Jovita Kussmaul, MD;  Location: [facility];  Service: General;  Laterality: Right;  . RECTAL SURGERY     x 3  . REPLACEMENT TOTAL KNEE BILATERAL    . SYNOVECTOMY Left 07/21/2015   Procedure: LEFT KNEE SYNOVECTOMY with POLY EXCHANGE;  Surgeon: Vickey Huger, MD;  Location: [facility];  Service: Orthopedics;  Laterality: Left;  . TOTAL KNEE REVISION Right 07/05/2016   Procedure: TOTAL KNEE REVISION WITH SCAR DEBRIDEMENT/PATELLA REVISION WITH POLY EXCHANGE;  Surgeon: Vickey Huger, MD;  Location: [facility];  Service: Orthopedics;  Laterality: Right;   Social History   Socioeconomic History  . Marital status: Soil scientist    Spouse name: Not on file  . Number of children:  Not on file  . Years of education: Not on file  . Highest education level: Not on file  Occupational History  . Not on file  Tobacco Use  . Smoking status: Never Smoker  . Smokeless tobacco: Never Used  Vaping Use  . Vaping Use: Never used  Substance and Sexual Activity  . Alcohol use: Yes    Comment: WINE OCC  . Drug use: No  . Sexual activity: Not on file  Other Topics Concern  . Not on file  Social History Narrative  . Not on file   Social Determinants of Health   Financial Resource Strain: Not on file  Food Insecurity: Not on file  Transportation Needs: Not on file  Physical Activity: Not  on file  Stress: Not on file  Social Connections: Not on file   Family History  Problem Relation Age of Onset  . Cancer Mother   . Aneurysm Father    Allergies  Allergen Reactions  . Tramadol     Other reaction(s): itching   Prior to Admission medications   Medication Sig Start Date End Date Taking? Authorizing Provider  amLODipine-benazepril (LOTREL) 5-40 MG capsule Take 1 capsule by mouth daily.    [provider]  anastrozole (ARIMIDEX) 1 MG tablet Take 1 tablet (1 mg total) by mouth daily. 05/12/20   Nicholas Lose, MD  aspirin (ASPIRIN EC) 81 MG EC tablet Take 81 mg by mouth daily. Swallow whole.     [provider]  Calcium Carbonate-Vitamin D (CALCIUM 600+D) 600-400 MG-UNIT tablet Take 1 tablet by mouth 3 (three) times a week.     [provider]  Coenzyme Q10 (CO Q 10 PO) Take 200 mg by mouth every other day.     [provider]  doxycycline (VIBRA-TABS) 100 MG tablet Take 1 tablet (100 mg total) by mouth 2 (two) times daily. 04/10/20   Hayden Pedro, PA-C  ezetimibe (ZETIA) 10 MG tablet Take 1 tablet (10 mg total) by mouth daily. FOLLOW UP DUE IN MAY 2022. PLEASE CALL AND SCHEDULE 10/13/20   Dorothy Spark, MD  fexofenadine (ALLEGRA) 180 MG tablet Take 180 mg by mouth daily as needed for allergies or rhinitis.    [provider]  Glucosamine-Chondroit-Vit C-Mn (GLUCOSAMINE 1500 COMPLEX PO) Take 1 tablet by mouth every other day.    [provider]  hydrochlorothiazide (HYDRODIURIL) 25 MG tablet Take 25 mg by mouth daily.    [provider]  HYDROcodone-acetaminophen (NORCO/VICODIN) 5-325 MG tablet Take 1-2 tablets by mouth every 6 (six) hours as needed for moderate pain or severe pain. 03/20/20   Jovita Kussmaul, MD  metFORMIN (GLUCOPHAGE) 500 MG tablet Take by mouth daily with breakfast.    [provider]  mometasone (NASONEX) 50 MCG/ACT nasal spray Place 2 sprays into the nose 3 (three) times a week.      [provider]  Multiple Vitamin (MULTIVITAMIN WITH MINERALS) TABS tablet Take 1 tablet by mouth 3 (three) times a week.     [provider]  Omega-3 Fatty Acids (OMEGA 3 PO) Take 1 capsule by mouth 3 (three) times a week.     [provider]  potassium chloride (KLOR-CON) 10 MEQ tablet TAKE ONE TABLET BY MOUTH ONCE DAILY 10/02/20   Dorothy Spark, MD  Probiotic Product (ALIGN) 4 MG CAPS Take 4 mg by mouth daily. supplement    [provider]  psyllium (HYDROCIL/METAMUCIL) 95 % PACK Take 1 packet by mouth daily.  [provider]  rosuvastatin (CRESTOR) 40 MG tablet TAKE ONE TABLET BY MOUTH ONCE DAILY 10/02/20   Dorothy Spark, MD  traMADol (ULTRAM) 50 MG tablet Take 50 mg by mouth every 6 (six) hours as needed for moderate pain.    [provider]  TURMERIC PO Take 500 mg by mouth every other day.    [provider]  vitamin E 400 UNIT capsule Take 400 Units by mouth every other day.    [provider]   DG Chest 1 View  Result Date: 11/25/2020 CLINICAL DATA:  LEFT hip fracture post fall EXAM: CHEST  1 VIEW COMPARISON:  12/19/2019 FINDINGS: Upper normal heart size. Mediastinal contours and pulmonary vascularity normal. Focal opacity lateral RIGHT upper lobe question infiltrate, subtle mass, or sclerosis in the RIGHT scapula. Lungs otherwise clear. No infiltrate, pleural effusion, or pneumothorax. Surgical clips RIGHT axilla. No additional osseous findings. IMPRESSION: RIGHT upper lobe opacity which could represent RIGHT upper lobe infiltrate, RIGHT upper lobe nodule/mass, or sclerosis in the RIGHT scapula; this could be assessed by follow-up CT when clinically able. Electronically Signed   By: Lavonia Dana M.D.   On: 11/25/2020 10:22   DG Hip Unilat With Pelvis 2-3 Views Left  Result Date: 11/25/2020 CLINICAL DATA:  Tripped over a table leg at work and fell landing on LEFT side, LEFT hip pain EXAM: DG HIP (WITH OR  WITHOUT PELVIS) 2-3V LEFT COMPARISON:  None FINDINGS: Osseous demineralization. Hip and SI joint spaces preserved. Displaced intertrochanteric fracture LEFT femur. No dislocation. Pelvis intact. IMPRESSION: Displaced intertrochanteric fracture LEFT femur. Electronically Signed   By: Lavonia Dana M.D.   On: 11/25/2020 10:19    Positive ROS: All other systems have been reviewed and were otherwise negative with the exception of those mentioned in the HPI and as above.  Physical Exam: General: Alert, no acute distress Cardiovascular: No pedal edema Respiratory: No cyanosis, no use of accessory musculature GI: No organomegaly, abdomen is soft and non-tender Skin: No lesions in the area of chief complaint Neurologic: Sensation intact distally Psychiatric: Patient is competent for consent with normal mood and affect Lymphatic: No axillary or cervical lymphadenopathy  MUSCULOSKELETAL:   Left Hip Exam: Left hip shortened and externally rotated. Skin intact. Compartments soft. Sensation and distal pulses intact. Ecchymosis about the left toes, which she reports is from dropping a can on her toes.   Assessment: Left intertrochanteric femur fracture  Plan: Discussed the diagnosis of intertrochanteric femur fracture with the patient. We will plan for surgical intervention with intramedullary nail by Dr. Alvan Dame. Timing of surgery will be dependent on clearance by Cardiology. Remain NPO for now. All questions welcomed and addressed.     Maurice March, PA-C Cell [phone]   11/25/2020 12:42 PM

## 2020-11-25 NOTE — ED Notes (Signed)
Patient in XR.

## 2020-11-25 NOTE — H&P (View-Only) (Signed)
ORTHOPAEDIC CONSULTATION  REQUESTING PHYSICIAN: Harold Hedge, MD  PCP:  Lawerance Cruel, MD  Chief Complaint: Fall, left hip pain   HPI: Andrea Farmer is a 80 y.o. female with a history of DM, breast cancer, HTN, who presented to [facility] ED on 11/25/20 after a fall this morning at home. She reports she was walking in her house when her foot caught on a piece of furniture causing her to fall onto her left side. She felt pain in the hip, and was unable to get up with help from her husband. She denies head injury or LOC. They called EMS and she was transported to [facility]. Imaging in the ED revealed left intertrochanteric femur fracture and Dr. Alvan Dame was consulted for orthopaedic management.  Today, she is resting in bed in the ED comfortably. She has a history of right total knee arthroplasty revision by Dr. Ronnie Derby in 2017. She lives at home with her husband, and typically ambulates without assistance. She has had a recent back issue with radiculopathy and has used a cane/walker at times due to pain. She was scheduled for back surgery with Dr. Davy Pique within the past several weeks, which she states was cancelled by Anesthesia due to cardiac concerns. She states she was cleared to have a breast procedure this summer. She has history of PVCs, SVT, and HTN and is scheduled for an appointment with a new Cardiologist, Dr. Johney Frame, on 11/28/20. She is not on anticoagulants. Her last meal was at 8:30 AM.   Past Medical History:  Diagnosis Date  . Acid reflux   . Back pain    arthritis  . Cataract    immature but doesn't know which eye  . Diabetes mellitus without complication ([place])   . High cholesterol    takes Crestor daily  . History of colon polyps    benign  . History of migraine   . Hypertension    takes Lotrel and HCTZ dailydaily  . Irritable bowel syndrome 03/11/2016  . Joint pain   . Joint swelling   . Osteoarthritis of knee 03/11/2016  . Pneumonia    30 plus yrs ago   Past  Surgical History:  Procedure Laterality Date  . ABDOMINAL HYSTERECTOMY    . BREAST BIOPSY Right 05/14/2015  . BREAST EXCISIONAL BIOPSY Left   . BREAST EXCISIONAL BIOPSY Right   . BREAST LUMPECTOMY WITH RADIOACTIVE SEED AND SENTINEL LYMPH NODE BIOPSY Right 03/20/2020   Procedure: RIGHT BREAST LUMPECTOMY WITH RADIOACTIVE SEED AND SENTINEL LYMPH NODE BIOPSY;  Surgeon: Jovita Kussmaul, MD;  Location: [facility];  Service: General;  Laterality: Right;  . BREAST LUMPECTOMY WITH RADIOACTIVE SEED LOCALIZATION Right 04/27/2019   Procedure: RIGHT BREAST LUMPECTOMY WITH RADIOACTIVE SEED LOCALIZATION;  Surgeon: Jovita Kussmaul, MD;  Location: [facility];  Service: General;  Laterality: Right;  . RECTAL SURGERY     x 3  . REPLACEMENT TOTAL KNEE BILATERAL    . SYNOVECTOMY Left 07/21/2015   Procedure: LEFT KNEE SYNOVECTOMY with POLY EXCHANGE;  Surgeon: Vickey Huger, MD;  Location: [facility];  Service: Orthopedics;  Laterality: Left;  . TOTAL KNEE REVISION Right 07/05/2016   Procedure: TOTAL KNEE REVISION WITH SCAR DEBRIDEMENT/PATELLA REVISION WITH POLY EXCHANGE;  Surgeon: Vickey Huger, MD;  Location: [facility];  Service: Orthopedics;  Laterality: Right;   Social History   Socioeconomic History  . Marital status: Soil scientist    Spouse name: Not on file  . Number of children:  Not on file  . Years of education: Not on file  . Highest education level: Not on file  Occupational History  . Not on file  Tobacco Use  . Smoking status: Never Smoker  . Smokeless tobacco: Never Used  Vaping Use  . Vaping Use: Never used  Substance and Sexual Activity  . Alcohol use: Yes    Comment: WINE OCC  . Drug use: No  . Sexual activity: Not on file  Other Topics Concern  . Not on file  Social History Narrative  . Not on file   Social Determinants of Health   Financial Resource Strain: Not on file  Food Insecurity: Not on file  Transportation Needs: Not on file  Physical Activity: Not  on file  Stress: Not on file  Social Connections: Not on file   Family History  Problem Relation Age of Onset  . Cancer Mother   . Aneurysm Father    Allergies  Allergen Reactions  . Tramadol     Other reaction(s): itching   Prior to Admission medications   Medication Sig Start Date End Date Taking? Authorizing Provider  amLODipine-benazepril (LOTREL) 5-40 MG capsule Take 1 capsule by mouth daily.    [provider]  anastrozole (ARIMIDEX) 1 MG tablet Take 1 tablet (1 mg total) by mouth daily. 05/12/20   Nicholas Lose, MD  aspirin (ASPIRIN EC) 81 MG EC tablet Take 81 mg by mouth daily. Swallow whole.     [provider]  Calcium Carbonate-Vitamin D (CALCIUM 600+D) 600-400 MG-UNIT tablet Take 1 tablet by mouth 3 (three) times a week.     [provider]  Coenzyme Q10 (CO Q 10 PO) Take 200 mg by mouth every other day.     [provider]  doxycycline (VIBRA-TABS) 100 MG tablet Take 1 tablet (100 mg total) by mouth 2 (two) times daily. 04/10/20   Hayden Pedro, PA-C  ezetimibe (ZETIA) 10 MG tablet Take 1 tablet (10 mg total) by mouth daily. FOLLOW UP DUE IN MAY 2022. PLEASE CALL AND SCHEDULE 10/13/20   Dorothy Spark, MD  fexofenadine (ALLEGRA) 180 MG tablet Take 180 mg by mouth daily as needed for allergies or rhinitis.    [provider]  Glucosamine-Chondroit-Vit C-Mn (GLUCOSAMINE 1500 COMPLEX PO) Take 1 tablet by mouth every other day.    [provider]  hydrochlorothiazide (HYDRODIURIL) 25 MG tablet Take 25 mg by mouth daily.    [provider]  HYDROcodone-acetaminophen (NORCO/VICODIN) 5-325 MG tablet Take 1-2 tablets by mouth every 6 (six) hours as needed for moderate pain or severe pain. 03/20/20   Jovita Kussmaul, MD  metFORMIN (GLUCOPHAGE) 500 MG tablet Take by mouth daily with breakfast.    [provider]  mometasone (NASONEX) 50 MCG/ACT nasal spray Place 2 sprays into the nose 3 (three) times a week.      [provider]  Multiple Vitamin (MULTIVITAMIN WITH MINERALS) TABS tablet Take 1 tablet by mouth 3 (three) times a week.     [provider]  Omega-3 Fatty Acids (OMEGA 3 PO) Take 1 capsule by mouth 3 (three) times a week.     [provider]  potassium chloride (KLOR-CON) 10 MEQ tablet TAKE ONE TABLET BY MOUTH ONCE DAILY 10/02/20   Dorothy Spark, MD  Probiotic Product (ALIGN) 4 MG CAPS Take 4 mg by mouth daily. supplement    [provider]  psyllium (HYDROCIL/METAMUCIL) 95 % PACK Take 1 packet by mouth daily.  [provider]  rosuvastatin (CRESTOR) 40 MG tablet TAKE ONE TABLET BY MOUTH ONCE DAILY 10/02/20   Dorothy Spark, MD  traMADol (ULTRAM) 50 MG tablet Take 50 mg by mouth every 6 (six) hours as needed for moderate pain.    [provider]  TURMERIC PO Take 500 mg by mouth every other day.    [provider]  vitamin E 400 UNIT capsule Take 400 Units by mouth every other day.    [provider]   DG Chest 1 View  Result Date: 11/25/2020 CLINICAL DATA:  LEFT hip fracture post fall EXAM: CHEST  1 VIEW COMPARISON:  12/19/2019 FINDINGS: Upper normal heart size. Mediastinal contours and pulmonary vascularity normal. Focal opacity lateral RIGHT upper lobe question infiltrate, subtle mass, or sclerosis in the RIGHT scapula. Lungs otherwise clear. No infiltrate, pleural effusion, or pneumothorax. Surgical clips RIGHT axilla. No additional osseous findings. IMPRESSION: RIGHT upper lobe opacity which could represent RIGHT upper lobe infiltrate, RIGHT upper lobe nodule/mass, or sclerosis in the RIGHT scapula; this could be assessed by follow-up CT when clinically able. Electronically Signed   By: Lavonia Dana M.D.   On: 11/25/2020 10:22   DG Hip Unilat With Pelvis 2-3 Views Left  Result Date: 11/25/2020 CLINICAL DATA:  Tripped over a table leg at work and fell landing on LEFT side, LEFT hip pain EXAM: DG HIP (WITH OR  WITHOUT PELVIS) 2-3V LEFT COMPARISON:  None FINDINGS: Osseous demineralization. Hip and SI joint spaces preserved. Displaced intertrochanteric fracture LEFT femur. No dislocation. Pelvis intact. IMPRESSION: Displaced intertrochanteric fracture LEFT femur. Electronically Signed   By: Lavonia Dana M.D.   On: 11/25/2020 10:19    Positive ROS: All other systems have been reviewed and were otherwise negative with the exception of those mentioned in the HPI and as above.  Physical Exam: General: Alert, no acute distress Cardiovascular: No pedal edema Respiratory: No cyanosis, no use of accessory musculature GI: No organomegaly, abdomen is soft and non-tender Skin: No lesions in the area of chief complaint Neurologic: Sensation intact distally Psychiatric: Patient is competent for consent with normal mood and affect Lymphatic: No axillary or cervical lymphadenopathy  MUSCULOSKELETAL:   Left Hip Exam: Left hip shortened and externally rotated. Skin intact. Compartments soft. Sensation and distal pulses intact. Ecchymosis about the left toes, which she reports is from dropping a can on her toes.   Assessment: Left intertrochanteric femur fracture  Plan: Discussed the diagnosis of intertrochanteric femur fracture with the patient. We will plan for surgical intervention with intramedullary nail by Dr. Alvan Dame. Timing of surgery will be dependent on clearance by Cardiology. Remain NPO for now. All questions welcomed and addressed.     Maurice March, PA-C Cell [phone]   11/25/2020 12:42 PM

## 2020-11-25 NOTE — Consult Note (Signed)
Cardiology Consultation:   Patient ID: Andrea Farmer MRN: [id number]; DOB: 01-Mar-1941  Admit date: 11/25/2020 Date of Consult: 11/25/2020  PCP:  Andrea Farmer, [facility]  Cardiologist:  Andrea Dawley, MD Advanced Practice Provider:  No care team member to display Electrophysiologist:  None   Patient Profile:   Andrea Farmer is a 80 y.o. 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  History of Present Illness:   Andrea Farmer is a 80 year old female with the above history who is followed by Andrea Farmer. In spring of 2021, she noted increased palpitations. Monitor in 01/2020 showed 11% PVC burden, multiple runs of atrial tachycardia, and short runs of non-sustained VT. Echo showed LVEF of 60-65% with normal wall motion, grade 1 diastolic dysfunction, and mild MR. She was last seen by Andrea Farmer in 02/2020 at which time she reported fatigue but was otherwise. Given worsening number of PVCs, repeat coronary CTA showed moderate disease that was negative by CT FFR.  She reports she had a mechanical fall this morning.  She reports she stubbed her toe and then fell on her left hip.  This resulted in a femur fracture.  She reports she had no chest pain or shortness of breath prior to the event.  She denies losing consciousness.  She did not hit her head.  She had no seizure-like symptoms reported.  No urination or defecation.  This appears to be a pure mechanical fall.  She has never had a heart attack.  Medical history mentioned above.  No stroke.  No diabetes that requires insulin.  No significant CKD.  She reports that she does exercise daily prior to her fall.  This includes 300 steps on a stairmaster.  She also reports she can  ride a bike for 10 min without any significant chest pain or shortness of breath.  Her monitor while in the emergency room demonstrated sinus rhythm with frequent PVCs.  She was noted to be hypokalemic with a potassium 3.1.  Creatinine 0.59.  Hemoglobin stable 12.3.  She is resting comfortably in the room without any symptoms.  She was noted to have frequent PVCs on the monitor.  X-ray reviewed which demonstrates left displaced intertrochanteric femur fracture.  Chest x-ray concerning for right upper lobe opacification which needs follow-up CT scan.   Past Medical History:  Diagnosis Date  . Acid reflux   . Back pain    arthritis  . Cataract    immature but doesn't know which eye  . Diabetes mellitus without complication ([place])   . High cholesterol    takes Crestor daily  . History of colon polyps    benign  . History of migraine   . Hypertension    takes Lotrel and HCTZ dailydaily  . Irritable bowel syndrome 03/11/2016  . Joint pain   . Joint swelling   . Osteoarthritis of knee 03/11/2016  . Pneumonia    30 plus yrs ago    Past Surgical History:  Procedure Laterality Date  . ABDOMINAL HYSTERECTOMY    . BREAST BIOPSY Right 05/14/2015  . BREAST EXCISIONAL BIOPSY Left   . BREAST EXCISIONAL BIOPSY Right   . BREAST LUMPECTOMY WITH RADIOACTIVE SEED AND SENTINEL LYMPH NODE BIOPSY  Right 03/20/2020   Procedure: RIGHT BREAST LUMPECTOMY WITH RADIOACTIVE SEED AND SENTINEL LYMPH NODE BIOPSY;  Surgeon: Andrea Kussmaul, MD;  Location: [facility];  Service: General;  Laterality: Right;  . BREAST LUMPECTOMY WITH RADIOACTIVE SEED LOCALIZATION Right 04/27/2019   Procedure: RIGHT BREAST LUMPECTOMY WITH RADIOACTIVE SEED LOCALIZATION;  Surgeon: Andrea Kussmaul, MD;  Location: [facility];  Service: General;  Laterality: Right;  . RECTAL SURGERY     x 3  . REPLACEMENT TOTAL KNEE BILATERAL    . SYNOVECTOMY Left 07/21/2015   Procedure: LEFT KNEE SYNOVECTOMY with POLY EXCHANGE;   Surgeon: Andrea Huger, MD;  Location: [facility];  Service: Orthopedics;  Laterality: Left;  . TOTAL KNEE REVISION Right 07/05/2016   Procedure: TOTAL KNEE REVISION WITH SCAR DEBRIDEMENT/PATELLA REVISION WITH POLY EXCHANGE;  Surgeon: Andrea Huger, MD;  Location: [facility];  Service: Orthopedics;  Laterality: Right;     Home Medications:  Prior to Admission medications   Medication Sig Start Date End Date Taking? Authorizing Provider  acetaminophen (TYLENOL) 500 MG tablet Take 1,000 mg by mouth every 6 (six) hours as needed for moderate pain.   Yes [provider]  amLODipine-benazepril (LOTREL) 5-40 MG capsule Take 1 capsule by mouth daily.   Yes [provider]  anastrozole (ARIMIDEX) 1 MG tablet Take 1 tablet (1 mg total) by mouth daily. 05/12/20  Yes Andrea Lose, MD  aspirin 81 MG EC tablet Take 81 mg by mouth every other day. Swallow whole.   Yes [provider]  Calcium Carbonate-Vitamin D 600-400 MG-UNIT tablet Take 1 tablet by mouth 3 (three) times a week.    Yes [provider]  Coenzyme Q10 (CO Q 10 PO) Take 200 mg by mouth every other day.    Yes [provider]  ezetimibe (ZETIA) 10 MG tablet Take 1 tablet (10 mg total) by mouth daily. FOLLOW UP DUE IN MAY 2022. PLEASE CALL AND SCHEDULE Patient taking differently: Take 10 mg by mouth daily. 10/13/20  Yes Andrea Spark, MD  Glucosamine-Chondroit-Vit C-Mn (GLUCOSAMINE 1500 COMPLEX PO) Take 1 tablet by mouth every other day.   Yes [provider]  hydrochlorothiazide (HYDRODIURIL) 25 MG tablet Take 25 mg by mouth daily.   Yes [provider]  Hyprom-Naphaz-Polysorb-Zn Sulf (CLEAR EYES COMPLETE OP) Place 1 drop into both eyes daily as needed (dry eyes).   Yes [provider]  metFORMIN (GLUCOPHAGE) 500 MG tablet Take 500 mg by mouth daily.   Yes [provider]  mometasone (NASONEX) 50 MCG/ACT nasal spray Place 2 sprays into the nose 3 (three) times a week.   Yes  [provider]  Multiple Vitamin (MULTIVITAMIN WITH MINERALS) TABS tablet Take 1 tablet by mouth 3 (three) times a week.    Yes [provider]  Omega-3 Fatty Acids (OMEGA 3 PO) Take 1 capsule by mouth 3 (three) times a week.    Yes [provider]  polyethylene glycol (MIRALAX / GLYCOLAX) 17 g packet Take 17 g by mouth daily. 08/30/18  Yes [provider]  potassium chloride (KLOR-CON) 10 MEQ tablet TAKE ONE TABLET BY MOUTH ONCE DAILY Patient taking differently: Take 10 mEq by mouth daily. 10/02/20  Yes Andrea Spark, MD  Probiotic Product (ALIGN) 4 MG CAPS Take 4 mg by mouth daily.   Yes [provider]  psyllium (HYDROCIL/METAMUCIL) 95 % PACK Take 1 packet by mouth daily.    Yes [provider]  rosuvastatin (CRESTOR) 40 MG  tablet TAKE ONE TABLET BY MOUTH ONCE DAILY Patient taking differently: Take 40 mg by mouth daily. 10/02/20  Yes Andrea Spark, MD  traMADol (ULTRAM) 50 MG tablet Take 50 mg by mouth every 6 (six) hours as needed for moderate pain.   Yes [provider]  TURMERIC PO Take 500 mg by mouth every other day.   Yes [provider]  vitamin E 400 UNIT capsule Take 400 Units by mouth every other day.   Yes [provider]  doxycycline (VIBRA-TABS) 100 MG tablet Take 1 tablet (100 mg total) by mouth 2 (two) times daily. Patient not taking: No sig reported 04/10/20   Hayden Pedro, PA-C  HYDROcodone-acetaminophen (NORCO/VICODIN) 5-325 MG tablet Take 1-2 tablets by mouth every 6 (six) hours as needed for moderate pain or severe pain. Patient not taking: No sig reported 03/20/20   Andrea Kussmaul, MD    Inpatient Medications: Scheduled Meds:  Continuous Infusions:  PRN Meds:   Allergies:    Allergies  Allergen Reactions  . Tramadol     Other reaction(s): itching    Social History:   Social History   Socioeconomic History  . Marital status: Soil scientist    Spouse name:  Not on file  . Number of children: Not on file  . Years of education: Not on file  . Highest education level: Not on file  Occupational History  . Not on file  Tobacco Use  . Smoking status: Never Smoker  . Smokeless tobacco: Never Used  Vaping Use  . Vaping Use: Never used  Substance and Sexual Activity  . Alcohol use: Yes    Comment: WINE OCC  . Drug use: No  . Sexual activity: Not on file  Other Topics Concern  . Not on file  Social History Narrative  . Not on file   Social Determinants of Health   Financial Resource Strain: Not on file  Food Insecurity: Not on file  Transportation Needs: Not on file  Physical Activity: Not on file  Stress: Not on file  Social Connections: Not on file  Intimate Partner Violence: Not on file    Family History:    Family History  Problem Relation Age of Onset  . Cancer Mother   . Aneurysm Father      ROS:  Please see the history of present illness.   All other ROS reviewed and negative.     Physical Exam/Data:   Vitals:   11/25/20 1008 11/25/20 1100 11/25/20 1245  BP: 133/71 (!) 119/92 115/69  Pulse: 61 71 68  Resp: 18 20 18   Temp: 97.7 F (36.5 C)    TempSrc: Oral    SpO2: 99% 98% 97%   No intake or output data in the 24 hours ending 11/25/20 1336 Last 3 Weights 08/13/2020 05/12/2020 04/10/2020  Weight (lbs) 175 lb 11.2 oz 173 lb 14.4 oz 174 lb 6.4 oz  Weight (kg) 79.697 kg 78.881 kg 79.107 kg     There is no height or weight on file to calculate BMI.  General:  Well nourished, well developed, in no acute distress HEENT: normal Lymph: no adenopathy Neck: no JVD Endocrine:  No thryomegaly Vascular: No carotid bruits; FA pulses 2+ bilaterally without bruits  Cardiac:  normal S1, S2; RRR; no murmur  Lungs:  clear to auscultation bilaterally, no wheezing, rhonchi or rales  Abd: soft, nontender, no hepatomegaly  Ext: no edema Musculoskeletal:  No deformities, BUE and BLE strength normal and equal Skin:  warm and dry   Neuro:  CNs 2-12 intact, no focal abnormalities noted Psych:  Normal affect   EKG:  The EKG was personally reviewed and demonstrates: Normal sinus rhythm heart rate 64, first-degree AV block noted, nonspecific ST-T changes Telemetry:  Telemetry was personally reviewed and demonstrates: Sinus rhythm heart rate in the seventies with PVCs  Relevant CV Studies: TTE 02/20/2020 1. Left ventricular ejection fraction, by estimation, is 60 to 65%. The  left ventricle has normal function. The left ventricle has no regional  wall motion abnormalities. There is mild left ventricular hypertrophy.  Left ventricular diastolic parameters  are consistent with Grade I diastolic dysfunction (impaired relaxation).  2. Right ventricular systolic function is normal. The right ventricular  size is normal. There is normal pulmonary artery systolic pressure. The  estimated right ventricular systolic pressure is 67.1 mmHg.  3. The mitral valve is abnormal. Mild mitral valve regurgitation.  4. The aortic valve is tricuspid. Aortic valve regurgitation is not  visualized.  5. Aortic dilatation noted. 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  6. The inferior vena cava is normal in size with greater than 50%  respiratory variability, suggesting right atrial pressure of 3 mmHg.   Coronary CTA 2020-03-05 25-49% RCA 25-49% left main Proximal LAD 50-69% Mid LAD 50-69% Minimal less than 25% left circumflex CT FFR negative  Laboratory Data:  High Sensitivity Troponin:  No results for input(s): TROPONINIHS in the last 720 hours.   Chemistry Recent Labs  Lab 11/25/20 1041  NA 140  K 3.1*  CL 102  CO2 27  GLUCOSE 152*  BUN 21  CREATININE 0.59  CALCIUM 9.5  GFRNONAA >60  ANIONGAP 11    Recent Labs  Lab 11/25/20 1041  PROT 7.6  ALBUMIN 4.5  AST 24  ALT 19  ALKPHOS 62  BILITOT 0.5   Hematology Recent Labs  Lab 11/25/20 1041  WBC 10.3  RBC 4.01  HGB 12.3  HCT  37.7  MCV 94.0  MCH 30.7  MCHC 32.6  RDW 13.3  PLT 255   BNPNo results for input(s): BNP, PROBNP in the last 168 hours.  DDimer No results for input(s): DDIMER in the last 168 hours.   Radiology/Studies:  DG Chest 1 View  Result Date: 11/25/2020 CLINICAL DATA:  LEFT hip fracture post fall EXAM: CHEST  1 VIEW COMPARISON:  12/19/2019 FINDINGS: Upper normal heart size. Mediastinal contours and pulmonary vascularity normal. Focal opacity lateral RIGHT upper lobe question infiltrate, subtle mass, or sclerosis in the RIGHT scapula. Lungs otherwise clear. No infiltrate, pleural effusion, or pneumothorax. Surgical clips RIGHT axilla. No additional osseous findings. IMPRESSION: RIGHT upper lobe opacity which could represent RIGHT upper lobe infiltrate, RIGHT upper lobe nodule/mass, or sclerosis in the RIGHT scapula; this could be assessed by follow-up CT when clinically able. Electronically Signed   By: Lavonia Dana M.D.   On: 11/25/2020 10:22   DG Hip Unilat With Pelvis 2-3 Views Left  Result Date: 11/25/2020 CLINICAL DATA:  Tripped over a table leg at work and fell landing on LEFT side, LEFT hip pain EXAM: DG HIP (WITH OR WITHOUT PELVIS) 2-3V LEFT COMPARISON:  None FINDINGS: Osseous demineralization. Hip and SI joint spaces preserved. Displaced intertrochanteric fracture LEFT femur. No dislocation. Pelvis intact. IMPRESSION: Displaced intertrochanteric fracture LEFT femur. Electronically Signed   By: Lavonia Dana M.D.   On: 11/25/2020 10:19     Assessment and Plan:   1.  Preoperative assessment -RCRI equals 0  which equates to 0.4% risk (very low risk) of major perioperative cardiovascular event.  He has no symptoms of angina.  She is well compensated and there is no evidence of heart failure examination.  I would recommend to proceed with surgery without further cardiac testing. -She can easily complete greater than 4 METS without any symptoms concerning for angina. -She is never had a history of  myocardial infarction.  She does has nonobstructive CAD per CT FFR.  No history of stroke, heart failure, diabetes on insulin or significant CKD.  2.  PVCs/brief SVT -Recent monitor with 11.3% PVC burden.  No major symptoms from this.  She reports intermittent palpitations.  I suspect her PVCs are largely driven by her hypokalemia.  I would recommend to replace this. -Her monitor also demonstrates brief likely ectopic atrial tachycardia events.  This is easily treated with metoprolol succinate 25 mg daily.  She can then follow-up with her primary cardiologist for further titration of medications. -Given her normal LV function and nonobstructive CAD this requires no further evaluation.  She reports no symptoms from the PVCs.  3.  Nonobstructive CAD -okay to hold aspirin at discretion of surgery.  They often operate while on aspirin.  I will defer DVT prophylaxis to surgery. -No symptoms of angina.  Nonischemic EKG. -Continue home Crestor and Zetia.  4.  Right upper lobe infiltrate -Chest CT recommended per radiology.  Defer this to the primary team.  For questions or updates, please contact [facility] Please consult [email or web address] for contact info under   [employer]. Audie Box, MD, [place]  [address], [place], [place] [address] [phone]  3:24 PM

## 2020-11-25 NOTE — H&P (Signed)
History and Physical        Hospital Admission Note Date: 11/25/2020  Patient name: Andrea Farmer Medical record number: [id number] Date of birth: 1941-01-17 Age: 80 y.o. Gender: female  PCP: Lawerance Cruel, MD   Chief Complaint    Chief Complaint  Patient presents with  . Hip Pain      HPI:   This is a 80 year old female with past medical history of hypertension, right total knee arthroplasty revision by Dr. Ronnie Derby in 2017, diabetes, hyperlipidemia breast cancer on anastrozole, SVT/NSVT, mild mitral regurgitation, moderate CAD per coronary CTA who presents to the ED with left hip pain s/p mechanical fall today. Patient was walking when her right foot got caught on furniture causing her to fall and landing on her left side resulting in left hip pain. Did not hit her head, no loss of consciousness, not anticoagulated. Takes aspirin. Also states that she has bruising on her left foot as she dropped a can from her pantry on her foot the other day.   Of note, the patient is supposed to undergo percutaneous laminectomy with Dr. Davy Pique, initially scheduled for 2/9, however was turned down stating that she needed cardiac clearance. She has an upcoming cardiology appointment coming up at the end of the week. Patient states that she regularly uses her bike or stairmaster without any chest pain or shortness of breath. Denies tobacco use. Denies any chest pain currently but does state that she occasionally will get left sided chest discomfort described as "a bubble" which only lasts a few minutes and is not associated with any activity. She is currently chest pain free.   ED Course: Afebrile, hemodynamically stable, on room air. Notable Labs: Sodium 140, K3.1, glucose 152, BUN 21, creatinine 0.59, WBC 10.3, Hb 12.3, platelets 255, COVID-19 and flu negative.. Notable Imaging: CXR-right upper lobe  opacity which could represent right upper lobe infiltrate, nodule/mass or sclerosis in the right scapula and could be followed up by CT when clinically able. Left hip CXR-displaced intertrochanteric fracture of left femur. Patient received Dilaudid, Zofran, KCl p.o.    Vitals:   11/25/20 1100 11/25/20 1245  BP: (!) 119/92 115/69  Pulse: 71 68  Resp: 20 18  Temp:    SpO2: 98% 97%     Review of Systems:  Review of Systems  All other systems reviewed and are negative.   Medical/Social/Family History   Past Medical History: Past Medical History:  Diagnosis Date  . Acid reflux   . Back pain    arthritis  . Cataract    immature but doesn't know which eye  . Diabetes mellitus without complication ([place])   . High cholesterol    takes Crestor daily  . History of colon polyps    benign  . History of migraine   . Hypertension    takes Lotrel and HCTZ dailydaily  . Irritable bowel syndrome 03/11/2016  . Joint pain   . Joint swelling   . Osteoarthritis of knee 03/11/2016  . Pneumonia    30 plus yrs ago    Past Surgical History:  Procedure Laterality Date  . ABDOMINAL HYSTERECTOMY    . BREAST BIOPSY Right 05/14/2015  . BREAST EXCISIONAL BIOPSY  Left   . BREAST EXCISIONAL BIOPSY Right   . BREAST LUMPECTOMY WITH RADIOACTIVE SEED AND SENTINEL LYMPH NODE BIOPSY Right 03/20/2020   Procedure: RIGHT BREAST LUMPECTOMY WITH RADIOACTIVE SEED AND SENTINEL LYMPH NODE BIOPSY;  Surgeon: Jovita Kussmaul, MD;  Location: [place];  Service: General;  Laterality: Right;  . BREAST LUMPECTOMY WITH RADIOACTIVE SEED LOCALIZATION Right 04/27/2019   Procedure: RIGHT BREAST LUMPECTOMY WITH RADIOACTIVE SEED LOCALIZATION;  Surgeon: Jovita Kussmaul, MD;  Location: [place];  Service: General;  Laterality: Right;  . RECTAL SURGERY     x 3  . REPLACEMENT TOTAL KNEE BILATERAL    . SYNOVECTOMY Left 07/21/2015   Procedure: LEFT KNEE SYNOVECTOMY with POLY EXCHANGE;  Surgeon: Vickey Huger, MD;  Location: [place];  Service: Orthopedics;  Laterality: Left;  . TOTAL KNEE REVISION Right 07/05/2016   Procedure: TOTAL KNEE REVISION WITH SCAR DEBRIDEMENT/PATELLA REVISION WITH POLY EXCHANGE;  Surgeon: Vickey Huger, MD;  Location: [place];  Service: Orthopedics;  Laterality: Right;    Medications: Prior to Admission medications   Medication Sig Start Date End Date Taking? Authorizing Provider  amLODipine-benazepril (LOTREL) 5-40 MG capsule Take 1 capsule by mouth daily.    [provider]  anastrozole (ARIMIDEX) 1 MG tablet Take 1 tablet (1 mg total) by mouth daily. 05/12/20   Nicholas Lose, MD  aspirin (ASPIRIN EC) 81 MG EC tablet Take 81 mg by mouth daily. Swallow whole.     [provider]  Calcium Carbonate-Vitamin D (CALCIUM 600+D) 600-400 MG-UNIT tablet Take 1 tablet by mouth 3 (three) times a week.     [provider]  Coenzyme Q10 (CO Q 10 PO) Take 200 mg by mouth every other day.     [provider]  doxycycline (VIBRA-TABS) 100 MG tablet Take 1 tablet (100 mg total) by mouth 2 (two) times daily. 04/10/20   Hayden Pedro, PA-C  ezetimibe (ZETIA) 10 MG tablet Take 1 tablet (10 mg total) by mouth daily. FOLLOW UP DUE IN MAY 2022. PLEASE CALL AND SCHEDULE 10/13/20   Dorothy Spark, MD  fexofenadine (ALLEGRA) 180 MG tablet Take 180 mg by mouth daily as needed for allergies or rhinitis.    [provider]  Glucosamine-Chondroit-Vit C-Mn (GLUCOSAMINE 1500 COMPLEX PO) Take 1 tablet by mouth every other day.    [provider]  hydrochlorothiazide (HYDRODIURIL) 25 MG tablet Take 25 mg by mouth daily.    [provider]  HYDROcodone-acetaminophen (NORCO/VICODIN) 5-325 MG tablet Take 1-2 tablets by mouth every 6 (six) hours as needed for moderate pain or severe pain. 03/20/20   Jovita Kussmaul, MD  metFORMIN (GLUCOPHAGE) 500 MG tablet Take by mouth daily with breakfast.    [provider]  mometasone (NASONEX) 50  MCG/ACT nasal spray Place 2 sprays into the nose 3 (three) times a week.     [provider]  Multiple Vitamin (MULTIVITAMIN WITH MINERALS) TABS tablet Take 1 tablet by mouth 3 (three) times a week.     [provider]  Omega-3 Fatty Acids (OMEGA 3 PO) Take 1 capsule by mouth 3 (three) times a week.     [provider]  potassium chloride (KLOR-CON) 10 MEQ tablet TAKE ONE TABLET BY MOUTH ONCE DAILY 10/02/20   Dorothy Spark, MD  Probiotic Product (ALIGN) 4 MG CAPS Take 4 mg by mouth daily. supplement    [provider]  psyllium (HYDROCIL/METAMUCIL) 95 % PACK Take 1 packet by mouth  daily.     [provider]  rosuvastatin (CRESTOR) 40 MG tablet TAKE ONE TABLET BY MOUTH ONCE DAILY 10/02/20   Dorothy Spark, MD  traMADol (ULTRAM) 50 MG tablet Take 50 mg by mouth every 6 (six) hours as needed for moderate pain.    [provider]  TURMERIC PO Take 500 mg by mouth every other day.    [provider]  vitamin E 400 UNIT capsule Take 400 Units by mouth every other day.    [provider]    Allergies:   Allergies  Allergen Reactions  . Tramadol     Other reaction(s): itching    Social History:  reports that she has never smoked. She has never used smokeless tobacco. She reports current alcohol use. She reports that she does not use drugs.  Family History: Family History  Problem Relation Age of Onset  . Cancer Mother   . Aneurysm Father      Objective   Physical Exam: Blood pressure 115/69, pulse 68, temperature 97.7 F (36.5 C), temperature source Oral, resp. rate 18, SpO2 97 %.  Physical Exam Vitals and nursing note reviewed.  Constitutional:      Appearance: Normal appearance.  HENT:     Head: Normocephalic and atraumatic.  Eyes:     Conjunctiva/sclera: Conjunctivae normal.  Cardiovascular:     Rate and Rhythm: Normal rate and regular rhythm.     Comments: Occasional PVCs on  telemetry Pulmonary:     Effort: Pulmonary effort is normal.     Breath sounds: Normal breath sounds.  Abdominal:     General: Abdomen is flat.     Palpations: Abdomen is soft.  Musculoskeletal:     Comments: LLE externally rotated and shortened Left foot with ecchymosis   Skin:    Coloration: Skin is not jaundiced or pale.  Neurological:     Mental Status: She is alert. Mental status is at baseline.  Psychiatric:        Mood and Affect: Mood normal.        Behavior: Behavior normal.     LABS on Admission: I have personally reviewed all the labs and imaging below    Basic Metabolic Panel: Recent Labs  Lab 11/25/20 1041  NA 140  K 3.1*  CL 102  CO2 27  GLUCOSE 152*  BUN 21  CREATININE 0.59  CALCIUM 9.5   Liver Function Tests: Recent Labs  Lab 11/25/20 1041  AST 24  ALT 19  ALKPHOS 62  BILITOT 0.5  PROT 7.6  ALBUMIN 4.5   No results for input(s): LIPASE, AMYLASE in the last 168 hours. No results for input(s): AMMONIA in the last 168 hours. CBC: Recent Labs  Lab 11/25/20 1041  WBC 10.3  NEUTROABS 8.0*  HGB 12.3  HCT 37.7  MCV 94.0  PLT 255   Cardiac Enzymes: No results for input(s): CKTOTAL, CKMB, CKMBINDEX, TROPONINI in the last 168 hours. BNP: Invalid input(s): POCBNP CBG: No results for input(s): GLUCAP in the last 168 hours.  Radiological Exams on Admission:  DG Chest 1 View  Result Date: 11/25/2020 CLINICAL DATA:  LEFT hip fracture post fall EXAM: CHEST  1 VIEW COMPARISON:  12/19/2019 FINDINGS: Upper normal heart size. Mediastinal contours and pulmonary vascularity normal. Focal opacity lateral RIGHT upper lobe question infiltrate, subtle mass, or sclerosis in the RIGHT scapula. Lungs otherwise clear. No infiltrate, pleural effusion, or pneumothorax. Surgical clips RIGHT axilla. No additional osseous findings. IMPRESSION: RIGHT upper lobe opacity which  could represent RIGHT upper lobe infiltrate, RIGHT upper lobe nodule/mass, or sclerosis in the  RIGHT scapula; this could be assessed by follow-up CT when clinically able. Electronically Signed   By: Lavonia Dana M.D.   On: 11/25/2020 10:22   DG Hip Unilat With Pelvis 2-3 Views Left  Result Date: 11/25/2020 CLINICAL DATA:  Tripped over a table leg at work and fell landing on LEFT side, LEFT hip pain EXAM: DG HIP (WITH OR WITHOUT PELVIS) 2-3V LEFT COMPARISON:  None FINDINGS: Osseous demineralization. Hip and SI joint spaces preserved. Displaced intertrochanteric fracture LEFT femur. No dislocation. Pelvis intact. IMPRESSION: Displaced intertrochanteric fracture LEFT femur. Electronically Signed   By: Lavonia Dana M.D.   On: 11/25/2020 10:19      EKG: normal sinus rhythm, 1st degree AV block   A & P   Principal Problem:   Closed left hip fracture, initial encounter [facility]) Active Problems:   Essential (primary) hypertension   Malignant neoplasm of lower-outer quadrant of right breast of female, estrogen receptor positive ([place])   1. Displaced left intertrochanteric femur fracture s/p mechanical fall from standing height a. Pain management per femur fracture order set b. Cardiology consulted for cardiac clearance c. N.p.o. for now pending orthopedic surgery recommendations.   2. CAD  hyperlipidemia a. On rosuvastatin, Zetia and aspirin  3. Diabetes a. Holding home Metformin b. Monitor glucose while n.p.o.  4. Hypertension a. Continue home amlodipine-benazepril and holding HCTZ b. Lopressor as needed  5. Breast cancer a. Possibly osteoporosis from anastrozole leading to subsequent fracture? b. Holding home anastrozole for now  6. Hypokalemia a. Received KCl p.o.    DVT prophylaxis: Heparin   Code Status: Prior  Diet: N.p.o. Family Communication: Admission, patients condition and plan of care including tests being ordered have been discussed with the patient who indicates understanding and agrees with the plan and Code Status. Disposition Plan: The appropriate patient  status for this patient is INPATIENT. Inpatient status is judged to be reasonable and necessary in order to provide the required intensity of service to ensure the patient's safety. The patient's presenting symptoms, physical exam findings, and initial radiographic and laboratory data in the context of their chronic comorbidities is felt to place them at high risk for further clinical deterioration. Furthermore, it is not anticipated that the patient will be medically stable for discharge from the hospital within 2 midnights of admission. The following factors support the patient status of inpatient.   " The patient's presenting symptoms include fall, left hip pain. " The worrisome physical exam findings include left hip externally rotated and shortened. " The initial radiographic and laboratory data are worrisome because of left hip fracture. " The chronic co-morbidities include breast cancer on anastrozole, hypertension, CAD, diabetes  * I certify that at the point of admission it is my clinical judgment that the patient will require inpatient hospital care spanning beyond 2 midnights from the point of admission due to high intensity of service, high risk for further deterioration and high frequency of surveillance required.*     Consultants  . Cardiology . Ortho  Procedures  . None  Time Spent on Admission: 66 minutes    Harold Hedge, DO [facility]  11/25/2020, 1:30 PM

## 2020-11-25 NOTE — ED Provider Notes (Signed)
  Face-to-face evaluation   History: She presents for evaluation of injury to left hip when she tripped earlier this morning at home.  She denies other injuries.  She does not have headache, neck pain or back pain.  She has some chronic ongoing problems including back pain and was due to have a partial discectomy done last week.  The surgery was canceled based on a coronary CT abnormality.  She is due to see her cardiologist, this week for medical clearance.  She denies other recent illnesses including fever, chills, vomiting or dizziness.  Physical exam: Awake and alert cooperative.  Left leg is shortened, externally rotated and she resists movement of the leg secondary to hip pain.  Medical screening examination/treatment/procedure(s) were conducted as a shared visit with non-physician practitioner(s) and myself.  I personally evaluated the patient during the encounter    Daleen Bo, MD 11/25/20 [phone]

## 2020-11-26 ENCOUNTER — Encounter (HOSPITAL_COMMUNITY)
Admission: EM | Disposition: A | Discharge status: Skilled Nursing Facility | Payer: Self-pay | Source: Home / Self Care | Attending: Internal Medicine

## 2020-11-26 ENCOUNTER — Inpatient Hospital Stay (HOSPITAL_COMMUNITY): Payer: PPO | Admitting: Anesthesiology

## 2020-11-26 ENCOUNTER — Inpatient Hospital Stay (HOSPITAL_COMMUNITY): Payer: PPO

## 2020-11-26 ENCOUNTER — Encounter (HOSPITAL_COMMUNITY): Payer: Self-pay | Admitting: Internal Medicine

## 2020-11-26 HISTORY — PX: INTRAMEDULLARY (IM) NAIL INTERTROCHANTERIC: SHX5875

## 2020-11-26 LAB — GLUCOSE, CAPILLARY
Glucose-Capillary: 94 mg/dL (ref 70–99)
Glucose-Capillary: 100 mg/dL — ABNORMAL HIGH (ref 70–99)
Glucose-Capillary: 79 mg/dL (ref 70–99)
Glucose-Capillary: 104 mg/dL — ABNORMAL HIGH (ref 70–99)
Glucose-Capillary: 125 mg/dL — ABNORMAL HIGH (ref 70–99)

## 2020-11-26 SURGERY — FIXATION, FRACTURE, INTERTROCHANTERIC, WITH INTRAMEDULLARY ROD
Anesthesia: Monitor Anesthesia Care | Site: Hip | Laterality: Left

## 2020-11-26 MED ORDER — METOCLOPRAMIDE HCL 5 MG/ML IJ SOLN
5.0000 mg | Freq: Three times a day (TID) | INTRAMUSCULAR | Status: DC | PRN
  Administered 2020-11-26 – 2020-11-29 (×2): 5 mg via INTRAVENOUS
  Filled 2020-11-26 (×2): qty 2

## 2020-11-26 MED ORDER — MENTHOL 3 MG MT LOZG
1.0000 | LOZENGE | OROMUCOSAL | Status: DC | PRN
  Filled 2020-11-26: qty 9

## 2020-11-26 MED ORDER — METHOCARBAMOL 500 MG PO TABS
500.0000 mg | ORAL_TABLET | Freq: Four times a day (QID) | ORAL | Status: DC | PRN
  Administered 2020-11-28: 500 mg via ORAL
  Filled 2020-11-26: qty 1

## 2020-11-26 MED ORDER — PHENYLEPHRINE 40 MCG/ML (10ML) SYRINGE FOR IV PUSH (FOR BLOOD PRESSURE SUPPORT)
PREFILLED_SYRINGE | INTRAVENOUS | Status: AC
  Filled 2020-11-26: qty 10

## 2020-11-26 MED ORDER — ONDANSETRON HCL 4 MG/2ML IJ SOLN
4.0000 mg | Freq: Four times a day (QID) | INTRAMUSCULAR | Status: DC | PRN
  Administered 2020-11-29: 4 mg via INTRAVENOUS
  Filled 2020-11-26: qty 2

## 2020-11-26 MED ORDER — MORPHINE SULFATE (PF) 2 MG/ML IV SOLN: 0.5000 mg | INTRAVENOUS | Status: DC | PRN

## 2020-11-26 MED ORDER — CEFAZOLIN SODIUM-DEXTROSE 2-4 GM/100ML-% IV SOLN
2.0000 g | INTRAVENOUS | Status: AC
  Administered 2020-11-26: 2 g via INTRAVENOUS
  Filled 2020-11-26: qty 100

## 2020-11-26 MED ORDER — FENTANYL CITRATE (PF) 100 MCG/2ML IJ SOLN: 25.0000 ug | INTRAMUSCULAR | Status: DC | PRN

## 2020-11-26 MED ORDER — PROPOFOL 10 MG/ML IV BOLUS
INTRAVENOUS | Status: DC | PRN
  Administered 2020-11-26: 30 mg via INTRAVENOUS
  Administered 2020-11-26: 20 mg via INTRAVENOUS
  Administered 2020-11-26 (×2): 30 mg via INTRAVENOUS
  Administered 2020-11-26 (×2): 20 mg via INTRAVENOUS

## 2020-11-26 MED ORDER — FERROUS SULFATE 325 (65 FE) MG PO TABS
325.0000 mg | ORAL_TABLET | Freq: Three times a day (TID) | ORAL | Status: DC
  Administered 2020-11-27 – 2020-11-30 (×9): 325 mg via ORAL
  Filled 2020-11-26 (×9): qty 1

## 2020-11-26 MED ORDER — POVIDONE-IODINE 10 % EX SWAB
2.0000 "application " | Freq: Once | CUTANEOUS | Status: AC
  Administered 2020-11-26: 2 via TOPICAL

## 2020-11-26 MED ORDER — BUPIVACAINE IN DEXTROSE 0.75-8.25 % IT SOLN
INTRATHECAL | Status: DC | PRN
  Administered 2020-11-26: 1.6 mL via INTRATHECAL

## 2020-11-26 MED ORDER — ONDANSETRON HCL 4 MG PO TABS
4.0000 mg | ORAL_TABLET | Freq: Four times a day (QID) | ORAL | Status: DC | PRN
  Administered 2020-11-26 – 2020-11-30 (×2): 4 mg via ORAL
  Filled 2020-11-26 (×2): qty 1

## 2020-11-26 MED ORDER — CHLORHEXIDINE GLUCONATE 0.12 % MT SOLN
15.0000 mL | OROMUCOSAL | Status: AC
  Administered 2020-11-26: 15 mL via OROMUCOSAL

## 2020-11-26 MED ORDER — CHLORHEXIDINE GLUCONATE CLOTH 2 % EX PADS
6.0000 | MEDICATED_PAD | Freq: Every day | CUTANEOUS | Status: DC
  Administered 2020-11-26 – 2020-11-29 (×4): 6 via TOPICAL

## 2020-11-26 MED ORDER — LACTATED RINGERS IV SOLN: Freq: Once | INTRAVENOUS | Status: AC

## 2020-11-26 MED ORDER — SODIUM CHLORIDE 0.9 % IR SOLN
Status: DC | PRN
  Administered 2020-11-26: 1000 mL

## 2020-11-26 MED ORDER — MEPERIDINE HCL 50 MG/ML IJ SOLN
INTRAMUSCULAR | Status: AC
  Administered 2020-11-26: 12.5 mg
  Filled 2020-11-26: qty 1

## 2020-11-26 MED ORDER — ADULT MULTIVITAMIN W/MINERALS CH
1.0000 | ORAL_TABLET | Freq: Every day | ORAL | Status: DC
  Administered 2020-11-27 – 2020-11-30 (×4): 1 via ORAL
  Filled 2020-11-26 (×4): qty 1

## 2020-11-26 MED ORDER — METHOCARBAMOL 1000 MG/10ML IJ SOLN
500.0000 mg | Freq: Four times a day (QID) | INTRAVENOUS | Status: DC | PRN
  Filled 2020-11-26: qty 5

## 2020-11-26 MED ORDER — AMISULPRIDE (ANTIEMETIC) 5 MG/2ML IV SOLN: 10.0000 mg | Freq: Once | INTRAVENOUS | Status: DC | PRN

## 2020-11-26 MED ORDER — ASPIRIN EC 325 MG PO TBEC
325.0000 mg | DELAYED_RELEASE_TABLET | Freq: Two times a day (BID) | ORAL | Status: DC
  Administered 2020-11-27 – 2020-11-30 (×7): 325 mg via ORAL
  Filled 2020-11-26 (×7): qty 1

## 2020-11-26 MED ORDER — PROPOFOL 10 MG/ML IV BOLUS
INTRAVENOUS | Status: AC
  Filled 2020-11-26: qty 20

## 2020-11-26 MED ORDER — ENSURE SURGERY PO LIQD
237.0000 mL | Freq: Two times a day (BID) | ORAL | Status: DC
  Administered 2020-11-27 – 2020-11-29 (×5): 237 mL via ORAL
  Filled 2020-11-26 (×8): qty 237

## 2020-11-26 MED ORDER — METOCLOPRAMIDE HCL 5 MG PO TABS
5.0000 mg | ORAL_TABLET | Freq: Three times a day (TID) | ORAL | Status: DC | PRN
  Administered 2020-11-29: 10 mg via ORAL
  Filled 2020-11-26: qty 2

## 2020-11-26 MED ORDER — STERILE WATER FOR IRRIGATION IR SOLN
Status: DC | PRN
Start: 2020-11-26 — End: 2020-11-26
  Administered 2020-11-26: 2000 mL

## 2020-11-26 MED ORDER — FENTANYL CITRATE (PF) 100 MCG/2ML IJ SOLN
INTRAMUSCULAR | Status: AC
  Filled 2020-11-26: qty 2

## 2020-11-26 MED ORDER — CEFAZOLIN SODIUM-DEXTROSE 2-4 GM/100ML-% IV SOLN
2.0000 g | Freq: Four times a day (QID) | INTRAVENOUS | Status: AC
  Administered 2020-11-26 – 2020-11-27 (×2): 2 g via INTRAVENOUS
  Filled 2020-11-26 (×2): qty 100

## 2020-11-26 MED ORDER — FENTANYL CITRATE (PF) 100 MCG/2ML IJ SOLN
INTRAMUSCULAR | Status: DC | PRN
  Administered 2020-11-26 (×2): 50 ug via INTRAVENOUS

## 2020-11-26 MED ORDER — CHLORHEXIDINE GLUCONATE 4 % EX LIQD
60.0000 mL | Freq: Once | CUTANEOUS | Status: DC
  Filled 2020-11-26: qty 60

## 2020-11-26 MED ORDER — POLYETHYLENE GLYCOL 3350 17 G PO PACK
17.0000 g | PACK | Freq: Every day | ORAL | Status: DC | PRN
Start: 2020-11-26 — End: 2020-11-30
  Administered 2020-11-27: 17 g via ORAL

## 2020-11-26 MED ORDER — DOCUSATE SODIUM 100 MG PO CAPS
100.0000 mg | ORAL_CAPSULE | Freq: Two times a day (BID) | ORAL | Status: DC
  Administered 2020-11-26 – 2020-11-29 (×6): 100 mg via ORAL
  Filled 2020-11-26 (×7): qty 1

## 2020-11-26 MED ORDER — PHENOL 1.4 % MT LIQD
1.0000 | OROMUCOSAL | Status: DC | PRN
  Filled 2020-11-26: qty 177

## 2020-11-26 MED ORDER — PHENYLEPHRINE HCL-NACL 10-0.9 MG/250ML-% IV SOLN
INTRAVENOUS | Status: DC | PRN
  Administered 2020-11-26: 25 ug/min via INTRAVENOUS

## 2020-11-26 MED ORDER — TRANEXAMIC ACID-NACL 1000-0.7 MG/100ML-% IV SOLN
1000.0000 mg | INTRAVENOUS | Status: AC
  Administered 2020-11-26: 1000 mg via INTRAVENOUS
  Filled 2020-11-26: qty 100

## 2020-11-26 MED ORDER — HYDROCODONE-ACETAMINOPHEN 5-325 MG PO TABS
1.0000 | ORAL_TABLET | ORAL | Status: DC | PRN
Start: 2020-11-26 — End: 2020-11-30
  Administered 2020-11-26 – 2020-11-27 (×4): 2 via ORAL
  Administered 2020-11-28 – 2020-11-29 (×2): 1 via ORAL
  Filled 2020-11-26: qty 2
  Filled 2020-11-26 (×2): qty 1
  Filled 2020-11-26 (×2): qty 2

## 2020-11-26 MED ORDER — PROPOFOL 500 MG/50ML IV EMUL
INTRAVENOUS | Status: DC | PRN
  Administered 2020-11-26: 50 ug/kg/min via INTRAVENOUS

## 2020-11-26 MED ORDER — HYDROCODONE-ACETAMINOPHEN 7.5-325 MG PO TABS
1.0000 | ORAL_TABLET | ORAL | Status: DC | PRN
Start: 2020-11-26 — End: 2020-11-30
  Administered 2020-11-29: 1 via ORAL
  Filled 2020-11-26: qty 1

## 2020-11-26 MED ORDER — PHENYLEPHRINE 40 MCG/ML (10ML) SYRINGE FOR IV PUSH (FOR BLOOD PRESSURE SUPPORT)
PREFILLED_SYRINGE | INTRAVENOUS | Status: DC | PRN
  Administered 2020-11-26: 120 ug via INTRAVENOUS
  Administered 2020-11-26: 80 ug via INTRAVENOUS

## 2020-11-26 MED ORDER — LACTATED RINGERS IV SOLN: INTRAVENOUS | Status: DC | PRN

## 2020-11-26 MED ORDER — ONDANSETRON HCL 4 MG/2ML IJ SOLN
INTRAMUSCULAR | Status: DC | PRN
  Administered 2020-11-26: 4 mg via INTRAVENOUS

## 2020-11-26 SURGICAL SUPPLY — 45 items
BAG ZIPLOCK 12X15 (MISCELLANEOUS) ×2 IMPLANT
BIT DRILL CANN LG 4.3MM (BIT) IMPLANT
BIT DRILL LAG SCREW (DRILL) IMPLANT
BNDG COHESIVE 6X5 TAN STRL LF (GAUZE/BANDAGES/DRESSINGS) ×1 IMPLANT
BOLT INSERTION JIG (BOLT) ×1 IMPLANT
COVER PERINEAL POST (MISCELLANEOUS) ×2 IMPLANT
COVER SURGICAL LIGHT HANDLE (MISCELLANEOUS) ×2 IMPLANT
COVER WAND RF STERILE (DRAPES) IMPLANT
DERMABOND ADVANCED (GAUZE/BANDAGES/DRESSINGS) ×3
DERMABOND ADVANCED .7 DNX12 (GAUZE/BANDAGES/DRESSINGS) IMPLANT
DRAPE STERI IOBAN 125X83 (DRAPES) ×2 IMPLANT
DRILL BIT CANN LG 4.3MM (BIT) ×2
DRILL LAG SCREW (DRILL) ×2
DRSG AQUACEL AG ADV 3.5X 4 (GAUZE/BANDAGES/DRESSINGS) ×4 IMPLANT
DRSG AQUACEL AG ADV 3.5X 6 (GAUZE/BANDAGES/DRESSINGS) ×3 IMPLANT
DURAPREP 26ML APPLICATOR (WOUND CARE) ×2 IMPLANT
ELECT REM PT RETURN 15FT ADLT (MISCELLANEOUS) ×2 IMPLANT
GLOVE ECLIPSE 8.0 STRL XLNG CF (GLOVE) ×2 IMPLANT
GLOVE ORTHO TXT STRL SZ7.5 (GLOVE) ×2 IMPLANT
GLOVE SURG UNDER POLY LF SZ7.5 (GLOVE) ×2 IMPLANT
GLOVE SURG UNDER POLY LF SZ8.5 (GLOVE) ×2 IMPLANT
GOWN STRL REUS W/TWL 2XL LVL3 (GOWN DISPOSABLE) ×2 IMPLANT
GOWN STRL REUS W/TWL LRG LVL3 (GOWN DISPOSABLE) ×2 IMPLANT
GUIDEPIN 3.2X17.5 THRD DISP (PIN) ×2 IMPLANT
GUIDEWIRE BALL NOSE 100CM (WIRE) ×1 IMPLANT
HIP FR NAIL LAG SCREW 10.5X110 (Orthopedic Implant) ×2 IMPLANT
HOLDER FOLEY CATH W/STRAP (MISCELLANEOUS) ×1 IMPLANT
KIT BASIN OR (CUSTOM PROCEDURE TRAY) ×2 IMPLANT
KIT TURNOVER KIT A (KITS) ×2 IMPLANT
MANIFOLD NEPTUNE II (INSTRUMENTS) ×2 IMPLANT
NAIL HIP FRACT 130D 11X180 (Screw) ×1 IMPLANT
PACK GENERAL/GYN (CUSTOM PROCEDURE TRAY) ×2 IMPLANT
PADDING CAST COTTON 6X4 STRL (CAST SUPPLIES) ×2 IMPLANT
PENCIL SMOKE EVACUATOR (MISCELLANEOUS) IMPLANT
PROTECTOR NERVE ULNAR (MISCELLANEOUS) ×2 IMPLANT
SCREW BONE CORTICAL 5.0X38 (Screw) ×1 IMPLANT
SCREW LAG HIP FR NAIL 10.5X110 (Orthopedic Implant) IMPLANT
SUT MNCRL AB 3-0 PS2 18 (SUTURE) IMPLANT
SUT MNCRL AB 4-0 PS2 18 (SUTURE) ×2 IMPLANT
SUT VIC AB 1 CT1 36 (SUTURE) ×2 IMPLANT
SUT VIC AB 2-0 CT1 27 (SUTURE) ×4
SUT VIC AB 2-0 CT1 27XBRD (SUTURE) ×2 IMPLANT
TOWEL OR 17X26 10 PK STRL BLUE (TOWEL DISPOSABLE) ×2 IMPLANT
TOWEL OR NON WOVEN STRL DISP B (DISPOSABLE) ×2 IMPLANT
TRAY FOLEY SLVR 14FR TEMP STAT (SET/KITS/TRAYS/PACK) ×1 IMPLANT

## 2020-11-26 NOTE — Progress Notes (Signed)
Patient arrived back to the floor. Patient tolerated surgery well. Will monitor patient. Patient is alert and oriented x3.

## 2020-11-26 NOTE — Brief Op Note (Signed)
11/26/2020  7:05 PM  PATIENT:  Karlene Lineman  80 y.o. female  PRE-OPERATIVE DIAGNOSIS:  LEFT INTERTROCHANTERIC FRACTURE  POST-OPERATIVE DIAGNOSIS:  LEFT INTERTROCHANTERIC FRACTURE  PROCEDURE:  Procedure(s): INTRAMEDULLARY (IM) NAIL INTERTROCHANTRIC (Left)  SURGEON:  Surgeon(s) and Role:    * Paralee Cancel, MD - Primary  PHYSICIAN ASSISTANT: Griffith Citron, PA-C  ANESTHESIA:   spinal  EBL:  100 mL   BLOOD ADMINISTERED:none  DRAINS: none   LOCAL MEDICATIONS USED:  NONE  SPECIMEN:  No Specimen  DISPOSITION OF SPECIMEN:  N/A  COUNTS:  YES  TOURNIQUET:  * No tourniquets in log *  DICTATION: .Other Dictation: Dictation Number [id number]  PLAN OF CARE: Admit to inpatient   PATIENT DISPOSITION:  PACU - hemodynamically stable.   Delay start of Pharmacological VTE agent (>24hrs) due to surgical blood loss or risk of bleeding: no

## 2020-11-26 NOTE — Anesthesia Procedure Notes (Signed)
Spinal  Patient location during procedure: OR Start time: 11/26/2020 5:30 PM End time: 11/26/2020 5:36 PM Staffing Performed: anesthesiologist  Anesthesiologist: Suzette Battiest, MD Preanesthetic Checklist Completed: patient identified, IV checked, site marked, risks and benefits discussed, surgical consent, monitors and equipment checked, pre-op evaluation and timeout performed Spinal Block Patient position: sitting Prep: DuraPrep Patient monitoring: heart rate, cardiac monitor, continuous pulse ox and blood pressure Approach: midline Location: L4-5 Injection technique: single-shot Needle Needle type: Pencan  Needle gauge: 24 G Needle length: 9 cm Assessment Sensory level: T4

## 2020-11-26 NOTE — Progress Notes (Signed)
Initial Nutrition Assessment  DOCUMENTATION CODES:   Obesity unspecified  INTERVENTION:   -Ensure Surgery po BID, each supplement provides 350 kcal and 20 grams of protein  -Multivitamin with minerals daily  NUTRITION DIAGNOSIS:   Increased nutrient needs related to hip fracture,post-op healing as evidenced by estimated needs.  GOAL:   Patient will meet greater than or equal to 90% of their needs  MONITOR:   PO intake,Supplement acceptance,Labs,Weight trends,I & O's  REASON FOR ASSESSMENT:   Consult Hip fracture protocol  ASSESSMENT:   80 y.o. female with a history of DM, breast cancer, HTN, who presented to [facility] ED on 11/25/20 after a fall this morning at home. Admitted for left hip fracture.  Patient currently NPO, awaiting surgery for left hip fracture this evening. No reports of poor appetite or weight loss PTA.  Will order Ensure Surgery for tomorrow once diet is advanced given increased needs.  Per weight records, weight is stable.  Medications reviewed.   Labs reviewed: CBGs: 94-115  NUTRITION - FOCUSED PHYSICAL EXAM:  Unable to complete  Diet Order:   Diet Order            Diet NPO time specified Except for: Sips with Meds  Diet effective midnight           Diet NPO time specified  Diet effective ____                 EDUCATION NEEDS:   No education needs have been identified at this time  Skin:  Skin Assessment: Reviewed RN Assessment  Last BM:  PTA  Height:   Ht Readings from Last 1 Encounters:  11/25/20 5\' 4"  (1.626 m)    Weight:   Wt Readings from Last 1 Encounters:  11/25/20 81 kg    BMI:  Body mass index is 30.65 kg/m.  Estimated Nutritional Needs:   Kcal:  1550-1750  Protein:  65-80g  Fluid:  1.7L/day  Clayton Bibles, MS, RD, LDN Inpatient Clinical Dietitian Contact information available via Amion

## 2020-11-26 NOTE — Anesthesia Procedure Notes (Signed)
Procedure Name: MAC Date/Time: 11/26/2020 5:27 PM Performed by: Lollie Sails, CRNA Pre-anesthesia Checklist: Patient identified, Emergency Drugs available, Suction available, Patient being monitored and Timeout performed Oxygen Delivery Method: Simple face mask

## 2020-11-26 NOTE — Progress Notes (Signed)
PROGRESS NOTE    Andrea Farmer  WFU:[id number] DOB: [birth year] DOA: 11/25/2020 PCP: Lawerance Cruel, MD   Chief Complaint  Patient presents with  . 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 As per report patient was walking when her right foot got caught on furniture causing her to fall and landing on her left side resulting in left hip pain. Did not hit her head, no loss of consciousness, not anticoagulated. Takes aspirin. Also states that she has bruising on her left foot as she dropped a can from her pantry on her foot the other day.   ED Course: Afebrile, hemodynamically stable, on room air. Notable Labs: Sodium 140, K3.1, glucose 152, BUN 21, creatinine 0.59, WBC 10.3, Hb 12.3, platelets 255, COVID-19 and flu negative.. Notable Imaging: CXR-right upper lobe opacity which could represent right upper lobe infiltrate, nodule/mass or sclerosis in the right scapula and could be followed up by CT when clinically able. Left hip CXR-displaced intertrochanteric fracture of left femur. Patient received Dilaudid, Zofran, KCl p.o.  Patient was admitted cardiology and orthopedic was consulted.  Subjective: Some left hip pain. "I have surgery at 5 or so today" No chest pain nausea or vomiting or shortness of breath, on RA   Assessment & Plan:  Closed left hip fracture, initial encounter , secondary to mechanical fall.  Awaiting for operative intervention today.  Seen by cardiology for preop evaluation patient able to complete greater than 4 METs, no history of MI/stroke/heart failure/diabetes on insulin or significant CKD-and is acceptable risk for operative intervention for hip per cardiology.  Continue PT OT/DVT prophylaxis postop as per orthopedics.  Continue on pain  medication.  Nonobstructive CAD/pure hypercholesterolemia: Okay to hold aspirin at discretion of surgery.  Continue Crestor/Zetia.  Hypertension: BP is controlled continue home amlodipine Benzapril and holding HCTZ  Diabetes: Holding home Metformin.  Continue sliding scale insulin. Recent Labs  Lab 11/25/20 2210 11/26/20 0726  GLUCAP 115* 94   Lab Results  Component Value Date   HGBA1C 7.0 (H) 06/08/2018   Right upper lobe opacity which could represent right upper lobe infiltrate, right upper lobe nodule/muscle sclerosis in the scapula and could be followed up with CT once surgery and clinically stable.  Has no respiratory symptoms and currently on room air  Breast cancer hx possibly -holding home anastrozole for now  Hypokalemia monitor and replace.  Malignant neoplasm of lower-outer quadrant of right breast of female, estrogen receptor positive ([place])  Morbid obesity with BMI > 30:Body mass index is 30.65 kg/m.  Outpatient follow-up with PCP  Nutrition: Diet Order            Diet NPO time specified Except for: Sips with Meds  Diet effective midnight           Diet NPO time specified  Diet effective ____                Pt's Body mass index is 30.65 kg/m.  DVT prophylaxis: SCD.  Chemical prophylaxis postop defer to orthopedics Code Status:   Code Status: Full Code  Family Communication: plan of care discussed with patient at bedside.  Status is: Inpatient Remains inpatient appropriate because:Inpatient level of care appropriate due to severity of illness and for hip fracutre  Dispo:The patient is from: Home wih husband  Anticipated d/c is to: SNF.She would like return to [employer].            Anticipated d/c date is: 3 days            Patient currently is not medically stable to d/c.            Difficult to place patient No  Consultants: Orthopedics Cardiology   Procedures:see note  Unresulted Labs (From admission, onward)          Start     Ordered    11/27/20 8101  Basic metabolic panel  Daily,   R     Question:  Specimen collection method  Answer:  Lab=Lab collect   11/26/20 0923   11/27/20 0500  CBC  Daily,   R     Question:  Specimen collection method  Answer:  Lab=Lab collect   11/26/20 0923         Culture/Microbiology    Component Value Date/Time   SDES URINE, CLEAN CATCH 07/15/2015 1033   SPECREQUEST NONE 07/15/2015 1033   CULT 2,000 COLONIES/mL INSIGNIFICANT GROWTH 07/15/2015 1033   REPTSTATUS 07/16/2015 FINAL 07/15/2015 1033    Other culture-see note  Medications: Scheduled Meds: . amLODipine  5 mg Oral Daily   And  . benazepril  40 mg Oral Daily  . chlorhexidine  60 mL Topical Once  . ezetimibe  10 mg Oral Daily  . [START ON 11/27/2020] feeding supplement  237 mL Oral BID BM  . insulin aspart  0-9 Units Subcutaneous TID WC  . metoprolol succinate  25 mg Oral Daily  . [START ON 11/27/2020] multivitamin with minerals  1 tablet Oral Daily  . mupirocin ointment  1 application Nasal BID  . povidone-iodine  2 application Topical Once  . rosuvastatin  40 mg Oral Daily   Continuous Infusions: .  ceFAZolin (ANCEF) IV    . tranexamic acid      Antimicrobials: Anti-infectives (From admission, onward)   Start     Dose/Rate Route Frequency Ordered Stop   11/26/20 1030  ceFAZolin (ANCEF) IVPB 2g/100 mL premix        2 g 200 mL/hr over 30 Minutes Intravenous On call to O.R. 11/26/20 0935 11/27/20 0559     Objective: Vitals: Today's Vitals   11/26/20 0011 11/26/20 0225 11/26/20 0633 11/26/20 0818  BP:  109/63 109/66   Pulse:  64 64   Resp:  14 14   Temp:  97.9 F (36.6 C) 97.6 F (36.4 C)   TempSrc:  Oral Oral   SpO2:  96% 97%   Weight:      Height:      PainSc: Asleep   3     Intake/Output Summary (Last 24 hours) at 11/26/2020 1142 Last data filed at 11/26/2020 7510 Gross per 24 hour  Intake --  Output 500 ml  Net -500 ml   Filed Weights   11/25/20 1906  Weight: 81 kg   Weight change:    Intake/Output from previous day: 02/15 0701 - 02/16 0700 In: -  Out: 500 [Urine:500] Intake/Output this shift: No intake/output data recorded. Filed Weights   11/25/20 1906  Weight: 81 kg   Examination: General exam: AAOx3, Obese,NAD, weak appearing. HEENT:Oral mucosa moist, Ear/Nose WNL grossly,dentition normal. Respiratory system: bilaterally clear,no wheezing or crackles,no use of accessory muscle, non tender. Cardiovascular system: S1 & S2 +, regular, No JVD. Gastrointestinal system: Abdomen soft, NT,ND, BS+. Nervous System:Alert, awake, moving extremities and grossly nonfocal Extremities: No edema, left  leg ext rotated,left hip tender, distal peripheral pulses palpable.  Skin: No rashes,no icterus. MSK: Normal muscle bulk,tone, power  Data Reviewed: I have personally reviewed following labs and imaging studies CBC: Recent Labs  Lab 11/25/20 1041  WBC 10.3  NEUTROABS 8.0*  HGB 12.3  HCT 37.7  MCV 94.0  PLT 409   Basic Metabolic Panel: Recent Labs  Lab 11/25/20 1041  NA 140  K 3.1*  CL 102  CO2 27  GLUCOSE 152*  BUN 21  CREATININE 0.59  CALCIUM 9.5   GFR: Estimated Creatinine Clearance: 59.7 mL/min (by C-G formula based on SCr of 0.59 mg/dL). Liver Function Tests: Recent Labs  Lab 11/25/20 1041  AST 24  ALT 19  ALKPHOS 62  BILITOT 0.5  PROT 7.6  ALBUMIN 4.5   No results for input(s): LIPASE, AMYLASE in the last 168 hours. No results for input(s): AMMONIA in the last 168 hours. Coagulation Profile: No results for input(s): INR, PROTIME in the last 168 hours. Cardiac Enzymes: No results for input(s): CKTOTAL, CKMB, CKMBINDEX, TROPONINI in the last 168 hours. BNP (last 3 results) No results for input(s): PROBNP in the last 8760 hours. HbA1C: No results for input(s): HGBA1C in the last 72 hours. CBG: Recent Labs  Lab 11/25/20 2210 11/26/20 0726  GLUCAP 115* 94   Lipid Profile: No results for input(s): CHOL, HDL, LDLCALC, TRIG, CHOLHDL,  LDLDIRECT in the last 72 hours. Thyroid Function Tests: No results for input(s): TSH, T4TOTAL, FREET4, T3FREE, THYROIDAB in the last 72 hours. Anemia Panel: No results for input(s): VITAMINB12, FOLATE, FERRITIN, TIBC, IRON, RETICCTPCT in the last 72 hours. Sepsis Labs: No results for input(s): PROCALCITON, LATICACIDVEN in the last 168 hours.  Recent Results (from the past 240 hour(s))  Resp Panel by RT-PCR (Flu A&B, Covid) Nasopharyngeal Swab     Status: None   Collection Time: 11/25/20 10:41 AM   Specimen: Nasopharyngeal Swab; Nasopharyngeal(NP) swabs in vial transport medium  Result Value Ref Range Status   SARS Coronavirus 2 by RT PCR NEGATIVE NEGATIVE Final    Comment: (NOTE) SARS-CoV-2 target nucleic acids are NOT DETECTED.  The SARS-CoV-2 RNA is generally detectable in upper respiratory specimens during the acute phase of infection. The lowest concentration of SARS-CoV-2 viral copies this assay can detect is 138 copies/mL. A negative result does not preclude SARS-Cov-2 infection and should not be used as the sole basis for treatment or other patient management decisions. A negative result may occur with  improper specimen collection/handling, submission of specimen other than nasopharyngeal swab, presence of viral mutation(s) within the areas targeted by this assay, and inadequate number of viral copies(<138 copies/mL). A negative result must be combined with clinical observations, patient history, and epidemiological information. The expected result is Negative.  Fact Sheet for Patients:  EntrepreneurPulse.com.au  Fact Sheet for Healthcare Providers:  IncredibleEmployment.be  This test is no t yet approved or cleared by the [place] FDA and  has been authorized for detection and/or diagnosis of SARS-CoV-2 by FDA under an Emergency Use Authorization (EUA). This EUA will remain  in effect (meaning this test can be used) for the  duration of the COVID-19 declaration under Section 564(b)(1) of the Act, 21 U.S.C.section 360bbb-3(b)(1), unless the authorization is terminated  or revoked sooner.       Influenza A by PCR NEGATIVE NEGATIVE Final   Influenza B by PCR NEGATIVE NEGATIVE Final    Comment: (NOTE) The Xpert Xpress SARS-CoV-2/FLU/RSV plus assay is intended as an aid in the diagnosis  of influenza from Nasopharyngeal swab specimens and should not be used as a sole basis for treatment. Nasal washings and aspirates are unacceptable for Xpert Xpress SARS-CoV-2/FLU/RSV testing.  Fact Sheet for Patients: EntrepreneurPulse.com.au  Fact Sheet for Healthcare Providers: IncredibleEmployment.be  This test is not yet approved or cleared by the [place] FDA and has been authorized for detection and/or diagnosis of SARS-CoV-2 by FDA under an Emergency Use Authorization (EUA). This EUA will remain in effect (meaning this test can be used) for the duration of the COVID-19 declaration under Section 564(b)(1) of the Act, 21 U.S.C. section 360bbb-3(b)(1), unless the authorization is terminated or revoked.  Performed at [facility], [place] [address], [place],  [address]   Surgical PCR screen     Status: None   Collection Time: 11/25/20 10:20 PM   Specimen: Nasal Mucosa; Nasal Swab  Result Value Ref Range Status   MRSA, PCR NEGATIVE NEGATIVE Final   Staphylococcus aureus NEGATIVE NEGATIVE Final    Comment: (NOTE) The Xpert SA Assay (FDA approved for NASAL specimens in patients 61 years of age and older), is one component of a comprehensive surveillance program. It is not intended to diagnose infection nor to guide or monitor treatment. Performed at [facility], [place] [address], [place],  [address]      Radiology Studies: DG Chest 1 View  Result Date: 11/25/2020 CLINICAL DATA:  LEFT hip fracture post fall EXAM: CHEST  1  VIEW COMPARISON:  12/19/2019 FINDINGS: Upper normal heart size. Mediastinal contours and pulmonary vascularity normal. Focal opacity lateral RIGHT upper lobe question infiltrate, subtle mass, or sclerosis in the RIGHT scapula. Lungs otherwise clear. No infiltrate, pleural effusion, or pneumothorax. Surgical clips RIGHT axilla. No additional osseous findings. IMPRESSION: RIGHT upper lobe opacity which could represent RIGHT upper lobe infiltrate, RIGHT upper lobe nodule/mass, or sclerosis in the RIGHT scapula; this could be assessed by follow-up CT when clinically able. Electronically Signed   By: Lavonia Dana M.D.   On: 11/25/2020 10:22   DG Hip Unilat With Pelvis 2-3 Views Left  Result Date: 11/25/2020 CLINICAL DATA:  Tripped over a table leg at work and fell landing on LEFT side, LEFT hip pain EXAM: DG HIP (WITH OR WITHOUT PELVIS) 2-3V LEFT COMPARISON:  None FINDINGS: Osseous demineralization. Hip and SI joint spaces preserved. Displaced intertrochanteric fracture LEFT femur. No dislocation. Pelvis intact. IMPRESSION: Displaced intertrochanteric fracture LEFT femur. Electronically Signed   By: Lavonia Dana M.D.   On: 11/25/2020 10:19     LOS: 1 day   Antonieta Pert, MD [facility]  11/26/2020, 11:42 AM

## 2020-11-26 NOTE — Anesthesia Postprocedure Evaluation (Signed)
Anesthesia Post Note  Patient: Andrea Farmer  Procedure(s) Performed: INTRAMEDULLARY (IM) NAIL INTERTROCHANTRIC (Left Hip)     Patient location during evaluation: PACU Anesthesia Type: MAC Level of consciousness: awake and alert Pain management: pain level controlled Vital Signs Assessment: post-procedure vital signs reviewed and stable Respiratory status: spontaneous breathing and respiratory function stable Cardiovascular status: blood pressure returned to baseline and stable Postop Assessment: spinal receding Anesthetic complications: no   No complications documented.  Last Vitals:  Vitals:   11/26/20 2005 11/26/20 2032  BP:  113/70  Pulse: 86 90  Resp: (!) 21 18  Temp: 36.9 C 37.3 C  SpO2: 99% 97%    Last Pain:  Vitals:   11/26/20 2032  TempSrc: Oral  PainSc:                  [name]

## 2020-11-26 NOTE — Interval H&P Note (Signed)
History and Physical Interval Note:  11/26/2020 5:17 PM  Andrea Farmer  has presented today for surgery, with the diagnosis of LEFT INTERTROCHANTERIC FRACTURE.  The various methods of treatment have been discussed with the patient and family. After consideration of risks, benefits and other options for treatment, the patient has consented to  Procedure(s): INTRAMEDULLARY (IM) NAIL INTERTROCHANTRIC (Left) as a surgical intervention.  The patient's history has been reviewed, patient examined, no change in status, stable for surgery.  I have reviewed the patient's chart and labs.  Questions were answered to the patient's satisfaction.     Mauri Pole

## 2020-11-26 NOTE — Anesthesia Preprocedure Evaluation (Addendum)
Anesthesia Evaluation  Patient identified by MRN, date of birth, ID band Patient awake    Reviewed: Allergy & Precautions, NPO status , Patient's Chart, lab work & pertinent test results, reviewed documented beta blocker date and time   Airway Mallampati: II  TM Distance: >3 FB Neck ROM: Full    Dental  (+) Dental Advisory Given   Pulmonary neg pulmonary ROS,    breath sounds clear to auscultation       Cardiovascular hypertension, Pt. on medications and Pt. on home beta blockers +CHF (grade 1 diastolic dysfunction)  + Valvular Problems/Murmurs (mild MR) MR  Rhythm:Regular Rate:Normal  Echo 02/2020: 1. Left ventricular ejection fraction, by estimation, is 60 to 65%. The  left ventricle has normal function. The left ventricle has no regional  wall motion abnormalities. There is mild left ventricular hypertrophy.  Left ventricular diastolic parameters  are consistent with Grade I diastolic dysfunction (impaired relaxation).  2. Right ventricular systolic function is normal. The right ventricular  size is normal. There is normal pulmonary artery systolic pressure. The  estimated right ventricular systolic pressure is 87.6 mmHg.  3. The mitral valve is abnormal. Mild mitral valve regurgitation.  4. The aortic valve is tricuspid. Aortic valve regurgitation is not  visualized.  5. Aortic dilatation noted. 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  6. The inferior vena cava is normal in size with greater than 50%  respiratory variability, suggesting right atrial pressure of 3 mmHg.    Neuro/Psych negative neurological ROS  negative psych ROS   GI/Hepatic Neg liver ROS, GERD  ,  Endo/Other  diabetes, Well Controlled, Type 2, Oral Hypoglycemic AgentsLast a1c 7.0  Renal/GU negative Renal ROS  negative genitourinary   Musculoskeletal  (+) Arthritis , Osteoarthritis,  L intertrochanteric femur fx    Abdominal   Peds  Hematology negative hematology ROS (+) hct 37.7, plt 255   Anesthesia Other Findings   Reproductive/Obstetrics negative OB ROS                           Lab Results  Component Value Date   WBC 10.3 11/25/2020   HGB 12.3 11/25/2020   HCT 37.7 11/25/2020   MCV 94.0 11/25/2020   PLT 255 11/25/2020   Lab Results  Component Value Date   CREATININE 0.59 11/25/2020   BUN 21 11/25/2020   NA 140 11/25/2020   K 3.1 (L) 11/25/2020   CL 102 11/25/2020   CO2 27 11/25/2020    Anesthesia Physical Anesthesia Plan  ASA: II  Anesthesia Plan: Spinal and MAC   Post-op Pain Management:    Induction:   PONV Risk Score and Plan: 2 and Propofol infusion and TIVA  Airway Management Planned: Natural Airway and Nasal Cannula  Additional Equipment: None  Intra-op Plan:   Post-operative Plan:   Informed Consent: I have reviewed the patients History and Physical, chart, labs and discussed the procedure including the risks, benefits and alternatives for the proposed anesthesia with the patient or authorized representative who has indicated his/her understanding and acceptance.       Plan Discussed with: CRNA  Anesthesia Plan Comments:       Anesthesia Quick Evaluation

## 2020-11-26 NOTE — Progress Notes (Signed)
   Subjective:   Procedure(s) (LRB): INTRAMEDULLARY (IM) NAIL INTERTROCHANTRIC (Left) Patient is resting in bed today on exam. She reports she is comfortable at rest.   Objective: Vital signs in last 24 hours: Temp:  [97.6 F (36.4 C)-98.8 F (37.1 C)] 97.6 F (36.4 C) (02/16 0633) Pulse Rate:  [61-73] 64 (02/16 0633) Resp:  [12-22] 14 (02/16 0633) BP: (97-133)/(63-92) 109/66 (02/16 0633) SpO2:  [94 %-99 %] 97 % (02/16 0633) Weight:  [81 kg] 81 kg (02/15 1906)  Intake/Output from previous day:  Intake/Output Summary (Last 24 hours) at 11/26/2020 0924 Last data filed at 11/26/2020 3888 Gross per 24 hour  Intake -  Output 500 ml  Net -500 ml     Intake/Output this shift: No intake/output data recorded.  Labs: Recent Labs    11/25/20 1041  HGB 12.3   Recent Labs    11/25/20 1041  WBC 10.3  RBC 4.01  HCT 37.7  PLT 255   Recent Labs    11/25/20 1041  NA 140  K 3.1*  CL 102  CO2 27  BUN 21  CREATININE 0.59  GLUCOSE 152*  CALCIUM 9.5   No results for input(s): LABPT, INR in the last 72 hours.  Exam: General - Patient is Alert and Oriented Right Hip: No obvious ecchymosis or skin lesions. Compartments soft. Distal pulses intact, sensation intact.   Past Medical History:  Diagnosis Date  . Acid reflux   . Back pain    arthritis  . Cataract    immature but doesn't know which eye  . Diabetes mellitus without complication ([place])   . High cholesterol    takes Crestor daily  . History of colon polyps    benign  . History of migraine   . Hypertension    takes Lotrel and HCTZ dailydaily  . Irritable bowel syndrome 03/11/2016  . Joint pain   . Joint swelling   . Osteoarthritis of knee 03/11/2016  . Pneumonia    30 plus yrs ago    Assessment/Plan:   Procedure(s) (LRB): INTRAMEDULLARY (IM) NAIL INTERTROCHANTRIC (Left) Principal Problem:   Closed left hip fracture, initial encounter ([place]) Active Problems:   Pure hypercholesterolemia   Essential  (primary) hypertension   Malignant neoplasm of lower-outer quadrant of right breast of female, estrogen receptor positive ([place])  Estimated body mass index is 30.65 kg/m as calculated from the following:   Height as of this encounter: 5\' 4"  (1.626 m).   Weight as of this encounter: 81 kg.   Assessment: Left intertrochanteric femur fracture  Plan: Plan for surgery this evening, likely around 5-6 PM with Dr. Alvan Dame. We reviewed the procedure today, including risks, benefits, and aftercare. Dr. Alvan Dame will discuss with patient this evening prior to surgery. NPO today, hold anticoagulant. Continue pain management as needed.   Griffith Citron, PA-C Orthopedic Surgery [phone] 11/26/2020, 9:24 AM

## 2020-11-26 NOTE — H&P (View-Only) (Signed)
   Subjective:   Procedure(s) (LRB): INTRAMEDULLARY (IM) NAIL INTERTROCHANTRIC (Left) Patient is resting in bed today on exam. She reports she is comfortable at rest.   Objective: Vital signs in last 24 hours: Temp:  [97.6 F (36.4 C)-98.8 F (37.1 C)] 97.6 F (36.4 C) (02/16 0633) Pulse Rate:  [61-73] 64 (02/16 0633) Resp:  [12-22] 14 (02/16 0633) BP: (97-133)/(63-92) 109/66 (02/16 3785) SpO2:  [94 %-99 %] 97 % (02/16 0633) Weight:  [81 kg] 81 kg (02/15 1906)  Intake/Output from previous day:  Intake/Output Summary (Last 24 hours) at 11/26/2020 0924 Last data filed at 11/26/2020 8850 Gross per 24 hour  Intake --  Output 500 ml  Net -500 ml     Intake/Output this shift: No intake/output data recorded.  Labs: Recent Labs    11/25/20 1041  HGB 12.3   Recent Labs    11/25/20 1041  WBC 10.3  RBC 4.01  HCT 37.7  PLT 255   Recent Labs    11/25/20 1041  NA 140  K 3.1*  CL 102  CO2 27  BUN 21  CREATININE 0.59  GLUCOSE 152*  CALCIUM 9.5   No results for input(s): LABPT, INR in the last 72 hours.  Exam: General - Patient is Alert and Oriented Right Hip: No obvious ecchymosis or skin lesions. Compartments soft. Distal pulses intact, sensation intact.   Past Medical History:  Diagnosis Date  . Acid reflux   . Back pain    arthritis  . Cataract    immature but doesn't know which eye  . Diabetes mellitus without complication ([place])   . High cholesterol    takes Crestor daily  . History of colon polyps    benign  . History of migraine   . Hypertension    takes Lotrel and HCTZ dailydaily  . Irritable bowel syndrome 03/11/2016  . Joint pain   . Joint swelling   . Osteoarthritis of knee 03/11/2016  . Pneumonia    30 plus yrs ago    Assessment/Plan:   Procedure(s) (LRB): INTRAMEDULLARY (IM) NAIL INTERTROCHANTRIC (Left) Principal Problem:   Closed left hip fracture, initial encounter ([place]) Active Problems:   Pure hypercholesterolemia   Essential  (primary) hypertension   Malignant neoplasm of lower-outer quadrant of right breast of female, estrogen receptor positive ([place])  Estimated body mass index is 30.65 kg/m as calculated from the following:   Height as of this encounter: 5\' 4"  (1.626 m).   Weight as of this encounter: 81 kg.   Assessment: Left intertrochanteric femur fracture  Plan: Plan for surgery this evening, likely around 5-6 PM with Dr. Alvan Dame. We reviewed the procedure today, including risks, benefits, and aftercare. Dr. Alvan Dame will discuss with patient this evening prior to surgery. NPO today, hold anticoagulant. Continue pain management as needed.   Griffith Citron, PA-C Orthopedic Surgery [phone] 11/26/2020, 9:24 AM

## 2020-11-26 NOTE — Transfer of Care (Signed)
Immediate Anesthesia Transfer of Care Note  Patient: Andrea Farmer  Procedure(s) Performed: INTRAMEDULLARY (IM) NAIL INTERTROCHANTRIC (Left Hip)  Patient Location: PACU  Anesthesia Type:Spinal  Level of Consciousness: awake and alert   Airway & Oxygen Therapy: Patient Spontanous Breathing and Patient connected to face mask oxygen  Post-op Assessment: Report given to RN and Post -op Vital signs reviewed and stable  Post vital signs: Reviewed and stable  Last Vitals:  Vitals Value Taken Time  BP 111/74 11/26/20 1918  Temp    Pulse 86 11/26/20 1920  Resp 18 11/26/20 1920  SpO2 100 % 11/26/20 1920  Vitals shown include unvalidated device data.  Last Pain:  Vitals:   11/26/20 1608  TempSrc: Oral  PainSc:       Patients Stated Pain Goal: 2 (48/18/59 0931)  Complications: No complications documented.

## 2020-11-27 ENCOUNTER — Inpatient Hospital Stay (HOSPITAL_COMMUNITY): Payer: PPO

## 2020-11-27 LAB — CBC
HCT: 32.8 % — ABNORMAL LOW (ref 36.0–46.0)
Hemoglobin: 10.5 g/dL — ABNORMAL LOW (ref 12.0–15.0)
MCH: 30.8 pg (ref 26.0–34.0)
MCHC: 32 g/dL (ref 30.0–36.0)
MCV: 96.2 fL (ref 80.0–100.0)
Platelets: 202 10*3/uL (ref 150–400)
RBC: 3.41 MIL/uL — ABNORMAL LOW (ref 3.87–5.11)
RDW: 13.4 % (ref 11.5–15.5)
WBC: 7.7 10*3/uL (ref 4.0–10.5)
nRBC: 0 % (ref 0.0–0.2)

## 2020-11-27 LAB — BASIC METABOLIC PANEL
Anion gap: 9 (ref 5–15)
BUN: 13 mg/dL (ref 8–23)
CO2: 25 mmol/L (ref 22–32)
Calcium: 8.7 mg/dL — ABNORMAL LOW (ref 8.9–10.3)
Chloride: 101 mmol/L (ref 98–111)
Creatinine, Ser: 0.65 mg/dL (ref 0.44–1.00)
GFR, Estimated: >60 mL/min (ref >60)
Glucose, Bld: 128 mg/dL — ABNORMAL HIGH (ref 70–99)
Potassium: 3.8 mmol/L (ref 3.5–5.1)
Sodium: 135 mmol/L (ref 135–145)

## 2020-11-27 LAB — GLUCOSE, CAPILLARY
Glucose-Capillary: 106 mg/dL — ABNORMAL HIGH (ref 70–99)
Glucose-Capillary: 207 mg/dL — ABNORMAL HIGH (ref 70–99)
Glucose-Capillary: 159 mg/dL — ABNORMAL HIGH (ref 70–99)
Glucose-Capillary: 142 mg/dL — ABNORMAL HIGH (ref 70–99)

## 2020-11-27 MED ORDER — ACETAMINOPHEN 325 MG PO TABS
325.0000 mg | ORAL_TABLET | Freq: Four times a day (QID) | ORAL | Status: DC | PRN
  Administered 2020-11-28: 650 mg via ORAL
  Filled 2020-11-27: qty 2

## 2020-11-27 MED ORDER — PSYLLIUM 95 % PO PACK
1.0000 | PACK | Freq: Every day | ORAL | Status: DC
  Administered 2020-11-27 – 2020-11-29 (×3): 1 via ORAL
  Filled 2020-11-27 (×3): qty 1

## 2020-11-27 MED ORDER — VITAMIN E 45 MG (100 UNIT) PO CAPS
400.0000 [IU] | ORAL_CAPSULE | ORAL | Status: DC
  Administered 2020-11-27 – 2020-11-29 (×2): 400 [IU] via ORAL
  Filled 2020-11-27 (×2): qty 4

## 2020-11-27 MED ORDER — METFORMIN HCL 500 MG PO TABS
500.0000 mg | ORAL_TABLET | Freq: Every day | ORAL | Status: DC
  Administered 2020-11-28 – 2020-11-30 (×3): 500 mg via ORAL
  Filled 2020-11-27 (×3): qty 1

## 2020-11-27 MED ORDER — CALCIUM CARBONATE-VITAMIN D 500-200 MG-UNIT PO TABS
1.0000 | ORAL_TABLET | Freq: Every day | ORAL | Status: DC
  Administered 2020-11-27 – 2020-11-30 (×4): 1 via ORAL
  Filled 2020-11-27 (×4): qty 1

## 2020-11-27 MED ORDER — FLUTICASONE PROPIONATE 50 MCG/ACT NA SUSP
1.0000 | Freq: Every day | NASAL | Status: DC | PRN
  Filled 2020-11-27: qty 16

## 2020-11-27 MED ORDER — CO Q 10 100 MG PO CAPS: 200.0000 mg | ORAL_CAPSULE | ORAL | Status: DC

## 2020-11-27 MED ORDER — ANASTROZOLE 1 MG PO TABS
1.0000 mg | ORAL_TABLET | Freq: Every day | ORAL | Status: DC
  Administered 2020-11-27 – 2020-11-30 (×4): 1 mg via ORAL
  Filled 2020-11-27 (×3): qty 1

## 2020-11-27 MED ORDER — POLYETHYLENE GLYCOL 3350 17 G PO PACK
17.0000 g | PACK | Freq: Every day | ORAL | Status: DC
  Administered 2020-11-27 – 2020-11-29 (×3): 17 g via ORAL
  Filled 2020-11-27 (×3): qty 1

## 2020-11-27 MED ORDER — OMEGA-3-ACID ETHYL ESTERS 1 G PO CAPS
1.0000 | ORAL_CAPSULE | ORAL | Status: DC
  Administered 2020-11-28 – 2020-11-29 (×2): 1 g via ORAL
  Filled 2020-11-27 (×2): qty 1

## 2020-11-27 NOTE — Evaluation (Signed)
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  Clinical Impression  Pt admitted with above diagnosis. Pt denies pain at rest, but when mobilizing to sit at EOB pain 10/10 limiting pt. After ~3 minutes of sitting EOB, attempted STS reps without success, pt c/o dizziness and nausea with BP 84/49 so assisted back to supine. Pt requires 2 person assist to return to supine, scoot up in bed and reposition to comfort due to high LLE pain. Pt previously independent without DME usage, has spouse and neighbors to assist as needed at home once discharged. Pt currently with functional limitations due to the deficits listed below (see PT Problem List). Pt will benefit from skilled PT to increase their independence and safety with mobility to allow discharge to the venue listed below.       Follow Up Recommendations Home health PT;Supervision/Assistance - 24 hour    Equipment Recommendations  None recommended by PT    Recommendations for Other Services       Precautions / Restrictions Precautions Precautions: Fall Precaution Comments: monitor BP Restrictions Weight Bearing Restrictions: No LLE Weight Bearing: Weight bearing as tolerated      Mobility  Bed Mobility Overal bed mobility: Needs Assistance Bed Mobility: Supine to Sit;Sit to Supine  Supine to sit: Max assist Sit to supine: Max assist;+2 for physical assistance;+2 for safety/equipment   General bed mobility comments: max A to come to upright sitting for LLE movement and to upright trunk, significantly increasead time; max A +2 to  return to supine due to high pain in L hip, use of bedpad and bed assist to scoot up in bed    Transfers Overall transfer level: Needs assistance  General transfer comment: STS from EOB with RW attempted 3 times with max A, able to lift bottom from bed but unable to achieve upright standing, limited by pain and weakness, reports dizziness and nausea with BP 84/49 so assisted back to supine  Ambulation/Gait  General Gait Details: not attempted due to BP  Stairs            Wheelchair Mobility    Modified Rankin (Stroke Patients Only)       Balance Overall balance assessment: Needs assistance Sitting-balance support: Feet supported;Bilateral upper extremity supported Sitting balance-Leahy Scale: Fair Sitting balance - Comments: seated EOB          Pertinent Vitals/Pain Pain Assessment: 0-10 Pain Score: 10-Worst pain ever Pain Location: L hip with mobility Pain Descriptors / Indicators: Aching;Grimacing;Guarding;Moaning;Sore Pain Intervention(s): Limited activity within patient's tolerance;Monitored during session;Repositioned;Patient requesting pain meds-RN notified;Ice applied    Home Living Family/patient expects to be discharged to:: Private residence Living Arrangements: Spouse/significant other Available Help at Discharge: Family;Available PRN/intermittently Type of Home: Other(Comment) (townhome) Home Access: Level entry     Home Layout: One level Home Equipment: Shower seat - built in;Cane - single point;Walker - 2 wheels;Bedside commode      Prior Function Level of Independence: Independent         Comments: Pt reports being independent with ADLs, community ambulation without AD, drives, travels with husband.     Hand Dominance   Dominant  Hand: Right    Extremity/Trunk Assessment   Upper Extremity Assessment Upper Extremity Assessment: Overall WFL for tasks assessed    Lower Extremity Assessment Lower Extremity Assessment: Generalized  weakness;LLE deficits/detail;RLE deficits/detail RLE Deficits / Details: AROM WNL, strength 3+/5 throughout, denies numbness/tingling LLE Deficits / Details: ankle and knee AROM WNL, quad 1/5, hip abduction 2-/5, hip adduction 2-/5, denies numbness/tingling LLE: Unable to fully assess due to pain LLE Sensation: WNL    Cervical / Trunk Assessment Cervical / Trunk Assessment: Normal  Communication   Communication: No difficulties  Cognition Arousal/Alertness: Awake/alert Behavior During Therapy: WFL for tasks assessed/performed Overall Cognitive Status: Within Functional Limits for tasks assessed    General Comments General comments (skin integrity, edema, etc.): Pt's BP 84/68mmHg after 3 minutes of sitting with reports of dizziness and nausea. Returned to supine with all symptoms resolved and BP 103/50mmHg. Pt's bandages intact and no drainage noted. RN in room at EOS notified of vitals and administerring pain medication per pt request.    Exercises     Assessment/Plan    PT Assessment Patient needs continued PT services  PT Problem List Decreased strength;Decreased range of motion;Decreased activity tolerance;Decreased balance;Decreased mobility;Obesity;Pain       PT Treatment Interventions DME instruction;Gait training;Functional mobility training;Therapeutic activities;Therapeutic exercise;Balance training;Neuromuscular re-education;Patient/family education;Modalities    PT Goals (Current goals can be found in the Care Plan section)  Acute Rehab PT Goals Patient Stated Goal: "go home right now" PT Goal Formulation: With patient Time For Goal Achievement: 12/11/20 Potential to Achieve Goals: Good    Frequency Min 3X/week   Barriers to discharge        Co-evaluation               AM-PAC PT "6 Clicks" Mobility  Outcome Measure Help needed turning from your back to your side while in a flat bed without using bedrails?: A Lot Help needed moving from lying on your  back to sitting on the side of a flat bed without using bedrails?: Total Help needed moving to and from a bed to a chair (including a wheelchair)?: Total Help needed standing up from a chair using your arms (e.g., wheelchair or bedside chair)?: Total Help needed to walk in hospital room?: Total Help needed climbing 3-5 steps with a railing? : Total 6 Click Score: 7    End of Session   Activity Tolerance: Patient limited by pain Patient left: in bed;with call bell/phone within reach;with nursing/sitter in room Nurse Communication: Mobility status;Patient requests pain meds;Other (comment) (BP) PT Visit Diagnosis: Unsteadiness on feet (R26.81);Other abnormalities of gait and mobility (R26.89);Muscle weakness (generalized) (M62.81);Pain Pain - Right/Left: Left Pain - part of body: Hip    Time: 1104-1140 PT Time Calculation (min) (ACUTE ONLY): 36 min   Charges:   PT Evaluation $PT Eval Low Complexity: 1 Low PT Treatments $Therapeutic Activity: 8-22 mins         Tori Shaquisha Wynn PT, DPT 11/27/20, 12:51 PM

## 2020-11-27 NOTE — Op Note (Signed)
NAME: Andrea Farmer, Andrea Farmer MEDICAL RECORD [id number] ACCOUNT [id number] DATE OF BIRTH:10/26/1940 FACILITY: [facility] LOCATION: [facility] PHYSICIAN:Domanic Matusek Marian Sorrow, MD  OPERATIVE REPORT  DATE OF PROCEDURE:  11/26/2020  PREOPERATIVE DIAGNOSIS:  Left displaced intertrochanteric femur fracture.  POSTOPERATIVE DIAGNOSIS:  Left displaced intertrochanteric femur fracture.  PROCEDURE:  Open reduction internal fixation of left intertrochanteric femur fracture utilizing a Biomet/Z[employer] Affixus intramedullary nail with a 130-degree lag screw and a distal interlock.  The nail was measured to be 11 mm x180 mm.  SURGEON:  Paralee Cancel, MD  ASSISTANT:  Griffith Citron, PA-C  Note that Andrea Farmer was present for the entirety of the case from preoperative positioning, perioperative management of the operative extremity during the case, assistance with maintenance of the reduction, particularly in the lateral plane while  fixation was being carried out as well as primary wound closure.  ANESTHESIA:  Spinal.  SPECIMENS:  None.  COMPLICATIONS:  None.  DRAINS:  None.  BLOOD LOSS:  Around 100 mL.  INDICATIONS FOR PROCEDURE:  The patient is a very pleasant 80 year old female who unfortunately had a ground level fall.  Her foot caught a piece of furniture fallen onto this left hip.  She had immediate onset of pain.  She was brought to the emergency  room where radiographs revealed a left intertrochanteric femur fracture.  We were consulted for definitive management.  She had a previous surgical procedure canceled due to potential cardiac history.  We had her evaluated by cardiology and she was  subsequently cleared.  Risks and benefits and necessity of the procedure were discussed and reviewed.  Consent was obtained.  Postoperative complications of malunion, nonunion, need for future surgery reviewed.  The postoperative course was reviewed with  regards to activity level and the potential for home  discharge.  DESCRIPTION OF PROCEDURE:  The patient was brought to the operative theater.  Once adequate anesthesia, preoperative anesthesia and antibiotics given.  She was positioned supine on the fracture table.  Her right unaffected extremity was flexed and  abducted out of the way.  The left hip was placed into the traction shoe.  Under fluoroscopic imaging, the traction and internal rotation was applied and under fluoroscopic imaging, the fracture reduced into a near anatomic position.  At this point, the  left hip was prepped and draped in sterile fashion.  A timeout was performed identifying the patient, the planned procedure and extremity.  Shower curtain technique was utilized.  Fluoroscopy was brought back to the field following the timeout.  The tip  of the trochanter was identified.  An incision was made lateral and proximal.  Soft tissue dissection was carried through the gluteal fascia.  A guidewire was then inserted into the tip of the trochanter in the AP and lateral planes across the fracture  site.  I then opened up the proximal femur with the appropriate drill.  I then selected the 11 mm nail.  On initial attempt at passing the nail it seemed to get stuck in the metadiaphyseal region of the femur.  For that reason, I removed the nail and  placed a ball-tipped guidewire to the knee and then reamed beginning with a 10.5 mm reamer up to 12 mm.  Once this was done, I removed the ball tip guidewire and was able to pass the nail down to its appropriate depth.  We then used the insertion jig With the guidewire insertion sleeves.  An incision was made laterally to get this down to the lateral cortex.  A  guidewire was then placed slightly anterior to the center and slightly posterior to the center after several attempts at trying to get this  into its best orientation.  I measured and selected a 110 mm lag screw.  We drilled for this and then passed the screw.  Traction was taken off the lower  extremity and the compression wheel was utilized to medialize the shaft of the femur to the fracture  site.  I then tightened the proximal locking bolt to the nail and then turned it back 1/4 to allow for some further compression.  Note that during the passage of the guidewire and lag screw, we did apply a downward force on the apex of the fracture,  which was anteriorly at the intertrochanteric segment.  This maintained the proximal intertrochanteric segment into a more anatomic position.  Once this was done, the final distal interlock was placed through the jig.  The jig was then removed and the  wound was irrigated.  Final radiographs were obtained in AP and lateral planes revealing a nearly anatomically reduced fracture with good clinical and operative compression identified.  At this point, we reapproximated the gluteal fascia using #1 Vicryl.   The remainder of the wounds were closed with 2-0 Vicryl and a running Monocryl.  The wounds were all cleaned, dried and dressed sterilely using surgical glue and Aquacel dressing.  The patient was then brought to the recovery room in stable condition,  tolerating the procedure well.  Findings were reviewed with her husband.  Postoperatively, we will allow her to be weightbearing as tolerated.  She will work with therapy and hope for home discharge.  We will use aspirin postoperatively for DVT  prophylaxis.  HN/NUANCE  D:11/26/2020 T:11/27/2020 JOB:[id number]

## 2020-11-27 NOTE — Progress Notes (Signed)
PROGRESS NOTE    RASHELL SHAMBAUGH  RWE:[id number] DOB: 20-Apr-1941 DOA: 11/25/2020 PCP: Lawerance Cruel, MD   Chief Complaint  Patient presents with  . 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 As per report patient was walking when her right foot got caught on furniture causing her to fall and landing on her left side resulting in left hip pain. Did not hit her head, no loss of consciousness, not anticoagulated. Takes aspirin. Also states that she has bruising on her left foot as she dropped a can from her pantry on her foot the other day.   ED Course: Afebrile, hemodynamically stable, on room air. Notable Labs: Sodium 140, K3.1, glucose 152, BUN 21, creatinine 0.59, WBC 10.3, Hb 12.3, platelets 255, COVID-19 and flu negative.. Notable Imaging: CXR-right upper lobe opacity which could represent right upper lobe infiltrate, nodule/mass or sclerosis in the right scapula and could be followed up by CT when clinically able. Left hip CXR-displaced intertrochanteric fracture of left femur. Patient received Dilaudid, Zofran, KCl p.o.  Patient was admitted cardiology and orthopedic was consulted. 2/16-patient underwent ORIF-left intramedullary intertrochanteric nail  Subjective:  Pain controlled foley+ not up with pt yet Alert awake oriented.  Asking to resume her anastrozole.  No chest pain nausea vomiting.  No acute events overnight.  Assessment & Plan:  Closed left hip fracture, initial encounter , secondary to mechanical fall. S/p left intramedullary intertrochanteric nail 2/16.  Remove Foley PT OT today WB AT.  Patient would like to go home and stay with skilled nursing facility-no need to make sure patient is safe for discharge home with further PT evaluation.  Since outpatient  orthopedic follow-up in 2 weeks for wound check and x-ray follow-up.  We will defer pain meds/DVT prophylaxis ( asa 325 mg) Rx to orthopedics  Nonobstructive CAD/pure hypercholesterolemia: On aspirin 325.  Continue Crestor/Zetia.  Hypertension: BP is stable, continue home amlodipine/Benzapril and holding HCTZ for now.   Anemia likely from blood loss in the setting of #1 at 10.5 g.  Monitor and transfuse if less than 7 g. Recent Labs  Lab 11/25/20 1041 11/27/20 0520  HGB 12.3 10.5*  HCT 37.7 32.8*   Diabetes: Blood sugar well controlled, resume home Metformin.  Continue sliding scale.  Recent Labs  Lab 11/26/20 1209 11/26/20 1609 11/26/20 1954 11/26/20 2104 11/27/20 0757  GLUCAP 100* 79 104* 125* 106*   Lab Results  Component Value Date   HGBA1C 7.0 (H) 06/08/2018   Right upper lobe opacity which could represent right upper lobe infiltrate, right upper lobe nodule/muscle sclerosis in the scapula and could be followed up with CT once surgery and clinically stable.Has no respiratory symptoms and currently on room air.would like CT here-and is ordered.  Breast cancer hx possibly -wants to resume anastrozole.  Hypokalemia resolved.  Morbid obesity with BMI > 30:Body mass index is 30.65 kg/m.  Outpatient follow-up with PCP  Nutrition: Diet Order            Diet regular Room service appropriate? Yes; Fluid consistency: Thin  Diet effective now                Pt's Body mass index is 30.65 kg/m.  DVT prophylaxis: SCD.  Chemical prophylaxis postop defer to orthopedics Code Status:   Code Status: Full Code  Family  Communication: plan of care discussed with patient at bedside.  Status is: Inpatient Remains inpatient appropriate because:Inpatient level of care appropriate due to severity of illness and for hip fracutre  Dispo:The patient is from: Home wih husband            Anticipated d/c is to: Home with home health.  Pending PT evaluation. She would like return to  [employer].            Anticipated d/c date is: 1-2 DAYS            Patient currently is not medically stable to d/c.            Difficult to place patient No  Consultants: Orthopedics Cardiology   Procedures:see note  Unresulted Labs (From admission, onward)          Start     Ordered   11/27/20 0109  Basic metabolic panel  Daily,   R     Question:  Specimen collection method  Answer:  Lab=Lab collect   11/26/20 0923   11/27/20 0500  CBC  Daily,   R     Question:  Specimen collection method  Answer:  Lab=Lab collect   11/26/20 0923   11/27/20 0500  CBC  Daily,   R      11/26/20 2047   11/27/20 3235  Basic metabolic panel  Daily,   R      11/26/20 2047         Culture/Microbiology    Component Value Date/Time   SDES URINE, CLEAN CATCH 07/15/2015 1033   SPECREQUEST NONE 07/15/2015 1033   CULT 2,000 COLONIES/mL INSIGNIFICANT GROWTH 07/15/2015 1033   REPTSTATUS 07/16/2015 FINAL 07/15/2015 1033    Other culture-see note  Medications: Scheduled Meds: . amLODipine  5 mg Oral Daily   And  . benazepril  40 mg Oral Daily  . aspirin EC  325 mg Oral BID  . Chlorhexidine Gluconate Cloth  6 each Topical Daily  . docusate sodium  100 mg Oral BID  . ezetimibe  10 mg Oral Daily  . feeding supplement  237 mL Oral BID BM  . ferrous sulfate  325 mg Oral TID PC  . insulin aspart  0-9 Units Subcutaneous TID WC  . metoprolol succinate  25 mg Oral Daily  . multivitamin with minerals  1 tablet Oral Daily  . mupirocin ointment  1 application Nasal BID  . rosuvastatin  40 mg Oral Daily   Continuous Infusions: . methocarbamol (ROBAXIN) IV      Antimicrobials: Anti-infectives (From admission, onward)   Start     Dose/Rate Route Frequency Ordered Stop   11/26/20 2300  ceFAZolin (ANCEF) IVPB 2g/100 mL premix        2 g 200 mL/hr over 30 Minutes Intravenous Every 6 hours 11/26/20 2047 11/27/20 0452   11/26/20 1030  ceFAZolin (ANCEF) IVPB 2g/100 mL premix        2 g 200 mL/hr over 30  Minutes Intravenous On call to O.R. 11/26/20 0935 11/26/20 1753     Objective: Vitals: Today's Vitals   11/27/20 0302 11/27/20 0617 11/27/20 0635 11/27/20 0707  BP: 118/61  123/64 108/65  Pulse: 81  70 76  Resp: 16  14 16   Temp: 98.4 F (36.9 C)  99.1 F (37.3 C) 98.1 F (36.7 C)  TempSrc: Oral  Oral Oral  SpO2: 97%  97% 97%  Weight:      Height:      PainSc:  5  4     Intake/Output Summary (Last 24 hours) at 11/27/2020 0956 Last data filed at 11/27/2020 0707 Gross per 24 hour  Intake 1891.06 ml  Output 775 ml  Net 1116.06 ml   Filed Weights   11/25/20 1906 11/26/20 1613  Weight: 81 kg 81 kg   Weight change: 0 kg  Intake/Output from previous day: 02/16 0701 - 02/17 0700 In: 1891.1 [I.V.:1600; IV Piggyback:291.1] Out: 755 [Urine:655; Blood:100] Intake/Output this shift: Total I/O In: -  Out: 20 [Urine:20] Filed Weights   11/25/20 1906 11/26/20 1613  Weight: 81 kg 81 kg   Examination: General exam: AAOX3 , NAD, weak appearing. HEENT:Oral mucosa moist, Ear/Nose WNL grossly, dentition normal. Respiratory system: bilaterally clear,no wheezing or crackles,no use of accessory muscle Cardiovascular system: S1 & S2 +, No JVD,. Gastrointestinal system: Abdomen soft, NT,ND, BS+ Nervous System:Alert, awake, moving extremities and grossly nonfocal Extremities: No edema, left hip surgical site with Aquacel in place, distal peripheral pulses palpable.  Skin: No rashes,no icterus. MSK: Normal muscle bulk,tone, power  Data Reviewed: I have personally reviewed following labs and imaging studies CBC: Recent Labs  Lab 11/25/20 1041 11/27/20 0520  WBC 10.3 7.7  NEUTROABS 8.0*  --   HGB 12.3 10.5*  HCT 37.7 32.8*  MCV 94.0 96.2  PLT 255 540   Basic Metabolic Panel: Recent Labs  Lab 11/25/20 1041 11/27/20 0520  NA 140 135  K 3.1* 3.8  CL 102 101  CO2 27 25  GLUCOSE 152* 128*  BUN 21 13  CREATININE 0.59 0.65  CALCIUM 9.5 8.7*   GFR: Estimated Creatinine  Clearance: 59.7 mL/min (by C-G formula based on SCr of 0.65 mg/dL). Liver Function Tests: Recent Labs  Lab 11/25/20 1041  AST 24  ALT 19  ALKPHOS 62  BILITOT 0.5  PROT 7.6  ALBUMIN 4.5   No results for input(s): LIPASE, AMYLASE in the last 168 hours. No results for input(s): AMMONIA in the last 168 hours. Coagulation Profile: No results for input(s): INR, PROTIME in the last 168 hours. Cardiac Enzymes: No results for input(s): CKTOTAL, CKMB, CKMBINDEX, TROPONINI in the last 168 hours. BNP (last 3 results) No results for input(s): PROBNP in the last 8760 hours. HbA1C: No results for input(s): HGBA1C in the last 72 hours. CBG: Recent Labs  Lab 11/26/20 1209 11/26/20 1609 11/26/20 1954 11/26/20 2104 11/27/20 0757  GLUCAP 100* 79 104* 125* 106*   Lipid Profile: No results for input(s): CHOL, HDL, LDLCALC, TRIG, CHOLHDL, LDLDIRECT in the last 72 hours. Thyroid Function Tests: No results for input(s): TSH, T4TOTAL, FREET4, T3FREE, THYROIDAB in the last 72 hours. Anemia Panel: No results for input(s): VITAMINB12, FOLATE, FERRITIN, TIBC, IRON, RETICCTPCT in the last 72 hours. Sepsis Labs: No results for input(s): PROCALCITON, LATICACIDVEN in the last 168 hours.  Recent Results (from the past 240 hour(s))  Resp Panel by RT-PCR (Flu A&B, Covid) Nasopharyngeal Swab     Status: None   Collection Time: 11/25/20 10:41 AM   Specimen: Nasopharyngeal Swab; Nasopharyngeal(NP) swabs in vial transport medium  Result Value Ref Range Status   SARS Coronavirus 2 by RT PCR NEGATIVE NEGATIVE Final    Comment: (NOTE) SARS-CoV-2 target nucleic acids are NOT DETECTED.  The SARS-CoV-2 RNA is generally detectable in upper respiratory specimens during the acute phase of infection. The lowest concentration of SARS-CoV-2 viral copies this assay can detect is 138 copies/mL. A negative result does not preclude SARS-Cov-2 infection and should not be used as the sole basis for treatment or  other  patient management decisions. A negative result may occur with  improper specimen collection/handling, submission of specimen other than nasopharyngeal swab, presence of viral mutation(s) within the areas targeted by this assay, and inadequate number of viral copies(<138 copies/mL). A negative result must be combined with clinical observations, patient history, and epidemiological information. The expected result is Negative.  Fact Sheet for Patients:  EntrepreneurPulse.com.au  Fact Sheet for Healthcare Providers:  IncredibleEmployment.be  This test is no t yet approved or cleared by the [place] FDA and  has been authorized for detection and/or diagnosis of SARS-CoV-2 by FDA under an Emergency Use Authorization (EUA). This EUA will remain  in effect (meaning this test can be used) for the duration of the COVID-19 declaration under Section 564(b)(1) of the Act, 21 U.S.C.section 360bbb-3(b)(1), unless the authorization is terminated  or revoked sooner.       Influenza A by PCR NEGATIVE NEGATIVE Final   Influenza B by PCR NEGATIVE NEGATIVE Final    Comment: (NOTE) The Xpert Xpress SARS-CoV-2/FLU/RSV plus assay is intended as an aid in the diagnosis of influenza from Nasopharyngeal swab specimens and should not be used as a sole basis for treatment. Nasal washings and aspirates are unacceptable for Xpert Xpress SARS-CoV-2/FLU/RSV testing.  Fact Sheet for Patients: EntrepreneurPulse.com.au  Fact Sheet for Healthcare Providers: IncredibleEmployment.be  This test is not yet approved or cleared by the [place] FDA and has been authorized for detection and/or diagnosis of SARS-CoV-2 by FDA under an Emergency Use Authorization (EUA). This EUA will remain in effect (meaning this test can be used) for the duration of the COVID-19 declaration under Section 564(b)(1) of the Act, 21 U.S.C. section  360bbb-3(b)(1), unless the authorization is terminated or revoked.  Performed at [facility], [place] [address], [place], [place] [address]   Surgical PCR screen     Status: None   Collection Time: 11/25/20 10:20 PM   Specimen: Nasal Mucosa; Nasal Swab  Result Value Ref Range Status   MRSA, PCR NEGATIVE NEGATIVE Final   Staphylococcus aureus NEGATIVE NEGATIVE Final    Comment: (NOTE) The Xpert SA Assay (FDA approved for NASAL specimens in patients 11 years of age and older), is one component of a comprehensive surveillance program. It is not intended to diagnose infection nor to guide or monitor treatment. Performed at [facility], [place] [address] [place], [place] [address]      Radiology Studies: DG Chest 1 View  Result Date: 11/25/2020 CLINICAL DATA:  LEFT hip fracture post fall EXAM: CHEST  1 VIEW COMPARISON:  12/19/2019 FINDINGS: Upper normal heart size. Mediastinal contours and pulmonary vascularity normal. Focal opacity lateral RIGHT upper lobe question infiltrate, subtle mass, or sclerosis in the RIGHT scapula. Lungs otherwise clear. No infiltrate, pleural effusion, or pneumothorax. Surgical clips RIGHT axilla. No additional osseous findings. IMPRESSION: RIGHT upper lobe opacity which could represent RIGHT upper lobe infiltrate, RIGHT upper lobe nodule/mass, or sclerosis in the RIGHT scapula; this could be assessed by follow-up CT when clinically able. Electronically Signed   By: Lavonia Dana M.D.   On: 11/25/2020 10:22   DG C-Arm 1-60 Min-No Report  Result Date: 11/26/2020 Fluoroscopy was utilized by the requesting physician.  No radiographic interpretation.   DG HIP OPERATIVE UNILAT W OR W/O PELVIS LEFT  Result Date: 11/26/2020 CLINICAL DATA:  Elective surgery. EXAM: OPERATIVE LEFT HIP (WITH PELVIS IF PERFORMED) TECHNIQUE: Fluoroscopic spot image(s) were submitted for interpretation post-operatively. COMPARISON:  Preoperative radiograph  yesterday. FINDINGS: Five  fluoroscopic spot views of the left hip obtained in the operating room. Intertrochanteric femur fracture with subsequent femoral intramedullary nail, distal locking and trans trochanteric screw fixation. Total fluoroscopy time 48 seconds. IMPRESSION: Procedural fluoroscopy during ORIF left proximal femur fracture. Electronically Signed   By: Keith Rake M.D.   On: 11/26/2020 19:41   DG Hip Unilat With Pelvis 2-3 Views Left  Result Date: 11/25/2020 CLINICAL DATA:  Tripped over a table leg at work and fell landing on LEFT side, LEFT hip pain EXAM: DG HIP (WITH OR WITHOUT PELVIS) 2-3V LEFT COMPARISON:  None FINDINGS: Osseous demineralization. Hip and SI joint spaces preserved. Displaced intertrochanteric fracture LEFT femur. No dislocation. Pelvis intact. IMPRESSION: Displaced intertrochanteric fracture LEFT femur. Electronically Signed   By: Lavonia Dana M.D.   On: 11/25/2020 10:19     LOS: 2 days   Antonieta Pert, MD [facility]  11/27/2020, 9:56 AM

## 2020-11-27 NOTE — Progress Notes (Signed)
Patient ID: Andrea Farmer, female   DOB: July 12, 1941, 80 y.o.   MRN: [id number] Subjective: 1 Day Post-Op Procedure(s) (LRB): INTRAMEDULLARY (IM) NAIL INTERTROCHANTRIC (Left)    Patient reports pain as mild.  Able to get a good night sleep last night. Remembers waking up confused but otherwise no events  Objective:   VITALS:   Vitals:   11/27/20 0302 11/27/20 0635  BP: 118/61 123/64  Pulse: 81 70  Resp: 16 14  Temp: 98.4 F (36.9 C) 99.1 F (37.3 C)  SpO2: 97% 97%    Neurovascular intact Incision: dressing C/D/I  LABS Recent Labs    11/25/20 1041 11/27/20 0520  HGB 12.3 10.5*  HCT 37.7 32.8*  WBC 10.3 7.7  PLT 255 202    Recent Labs    11/25/20 1041 11/27/20 0520  NA 140 135  K 3.1* 3.8  BUN 21 13  CREATININE 0.59 0.65  GLUCOSE 152* 128*    No results for input(s): LABPT, INR in the last 72 hours.   Assessment/Plan: 1 Day Post-Op Procedure(s) (LRB): INTRAMEDULLARY (IM) NAIL INTERTROCHANTRIC (Left)   Advance diet Up with therapy - WBAT Patient would like to go home so we will have her work with PT here for home safe discharge hopefully Friday pm versus Saturday RTC in 2 weeks for wound check and Xray follow up

## 2020-11-27 NOTE — Progress Notes (Signed)
PHARMACIST - PHYSICIAN ORDER COMMUNICATION  CONCERNING: P&T Medication Policy on Herbal Medications  DESCRIPTION:  This patient's order for:  Co Q 10  has been noted.  This product(s) is classified as an "herbal" or natural product. Due to a lack of definitive safety studies or FDA approval, nonstandard manufacturing practices, plus the potential risk of unknown drug-drug interactions while on inpatient medications, the Pharmacy and Therapeutics Committee does not permit the use of "herbal" or natural products of this type within [facility].   ACTION TAKEN: The pharmacy department is unable to verify this order at this time and your patient has been informed of this safety policy. Please reevaluate patient's clinical condition at discharge and address if the herbal or natural product(s) should be resumed at that time.  Minda Ditto PharmD 11/27/2020, 1:42 PM

## 2020-11-28 ENCOUNTER — Ambulatory Visit: Payer: PPO | Admitting: Cardiology

## 2020-11-28 LAB — BASIC METABOLIC PANEL
Anion gap: 7 (ref 5–15)
BUN: 23 mg/dL (ref 8–23)
CO2: 29 mmol/L (ref 22–32)
Calcium: 8.6 mg/dL — ABNORMAL LOW (ref 8.9–10.3)
Chloride: 100 mmol/L (ref 98–111)
Creatinine, Ser: 0.68 mg/dL (ref 0.44–1.00)
GFR, Estimated: >60 mL/min (ref >60)
Glucose, Bld: 137 mg/dL — ABNORMAL HIGH (ref 70–99)
Potassium: 4.2 mmol/L (ref 3.5–5.1)
Sodium: 136 mmol/L (ref 135–145)

## 2020-11-28 LAB — CBC
HCT: 28.2 % — ABNORMAL LOW (ref 36.0–46.0)
Hemoglobin: 9 g/dL — ABNORMAL LOW (ref 12.0–15.0)
MCH: 29.9 pg (ref 26.0–34.0)
MCHC: 31.9 g/dL (ref 30.0–36.0)
MCV: 93.7 fL (ref 80.0–100.0)
Platelets: 176 10*3/uL (ref 150–400)
RBC: 3.01 MIL/uL — ABNORMAL LOW (ref 3.87–5.11)
RDW: 13.7 % (ref 11.5–15.5)
WBC: 6.7 10*3/uL (ref 4.0–10.5)
nRBC: 0 % (ref 0.0–0.2)

## 2020-11-28 LAB — GLUCOSE, CAPILLARY
Glucose-Capillary: 116 mg/dL — ABNORMAL HIGH (ref 70–99)
Glucose-Capillary: 273 mg/dL — ABNORMAL HIGH (ref 70–99)
Glucose-Capillary: 152 mg/dL — ABNORMAL HIGH (ref 70–99)
Glucose-Capillary: 108 mg/dL — ABNORMAL HIGH (ref 70–99)

## 2020-11-28 MED ORDER — BISACODYL 10 MG RE SUPP
10.0000 mg | Freq: Every day | RECTAL | Status: DC | PRN
  Administered 2020-11-29: 10 mg via RECTAL
  Filled 2020-11-28: qty 1

## 2020-11-28 MED ORDER — ASPIRIN 325 MG PO TBEC
325.0000 mg | DELAYED_RELEASE_TABLET | Freq: Two times a day (BID) | ORAL | 0 refills | Status: DC
Start: 2020-11-28 — End: 2021-02-17

## 2020-11-28 MED ORDER — HYDROCODONE-ACETAMINOPHEN 5-325 MG PO TABS: 1.0000 | ORAL_TABLET | Freq: Four times a day (QID) | ORAL | 0 refills | Status: DC | PRN

## 2020-11-28 MED ORDER — PHENYLEPHRINE-MINERAL OIL-PET 0.25-14-74.9 % RE OINT
1.0000 "application " | TOPICAL_OINTMENT | Freq: Two times a day (BID) | RECTAL | Status: DC | PRN
  Administered 2020-11-29: 1 via RECTAL
  Filled 2020-11-28: qty 57

## 2020-11-28 NOTE — Progress Notes (Signed)
Physical Therapy Treatment Patient Details Name: Andrea Farmer MRN: [id number] DOB: Sep 20, 1941 Today's Date: 11/28/2020    History of Present Illness Andrea Farmer is a 80 year old female with past medical history of HTN, R TKA revision by Dr. Ronnie Derby in 2017, diabetes, hyperlipidemia, breast cancer, SVT/NSVT, mild mitral regurgitation, moderate CAD per coronary CTA, currently here for mechanical fall and s/p ORIF with L femur intramedullary nail on 11/26/20.    PT Comments    Pt with improved mobility this session, but still requiring mod A with bed mobility and mod A +2 to transfer and take steps in room. Pt with moments of confusion, denying pain, asking therapy for pain medication, and reporting no therapy yesterday- therapist continues to orient, encourage and soothe pt throughout session. Pt encouraged once up in chair and appears more clear. Therapist educated pt on strengthening exercises while up, able to perform with cues and assist as needed and educated to perform additional sets and reps throughout the day as tolerated- pt verbalizes understanding. Updated d/c recs to SNF due to continued assist level and to improve strength and safety with transfers, bed mobility and ambulation.  Follow Up Recommendations  SNF     Equipment Recommendations  None recommended by PT    Recommendations for Other Services       Precautions / Restrictions Precautions Precautions: Fall Restrictions Weight Bearing Restrictions: No LLE Weight Bearing: Weight bearing as tolerated    Mobility  Bed Mobility Overal bed mobility: Needs Assistance Bed Mobility: Supine to Sit  Supine to sit: Mod assist  General bed mobility comments: mod A to bring LLE over and slide out to EOB    Transfers Overall transfer level: Needs assistance Equipment used: Rolling walker (2 wheeled) Transfers: Sit to/from Stand Sit to Stand: Mod assist;+2 physical assistance;+2 safety/equipment  General transfer comment: mod A  +2 with therapist and tech on either side of pt, heavy cues for quad and glute activation, rocking momentum utilized to assist in powering up, pt denies dizziness and nausea upon standing  Ambulation/Gait Ambulation/Gait assistance: Mod assist Gait Distance (Feet): 3 Feet Assistive device: Rolling walker (2 wheeled) Gait Pattern/deviations: Shuffle;Trunk flexed Gait velocity: decreased   General Gait Details: pt able to take shuffling steps over to chair, unable to clear either foot from floor , heavy UE use on RW, trunk flexed despite cues, limited due to pain   Stairs             Wheelchair Mobility    Modified Rankin (Stroke Patients Only)       Balance Overall balance assessment: Needs assistance Sitting-balance support: Feet supported;Bilateral upper extremity supported Sitting balance-Leahy Scale: Fair Sitting balance - Comments: seated EOB   Standing balance support: During functional activity;Bilateral upper extremity supported Standing balance-Leahy Scale: Poor Standing balance comment: reliant on UE support, min-mod A       Cognition Arousal/Alertness: Awake/alert Behavior During Therapy: WFL for tasks assessed/performed Overall Cognitive Status: Within Functional Limits for tasks assessed  General Comments: Pt appears to have moments of confusion, initially reports no pain then tells therapist "I have asked you for medicine 3 times". Therapist oriented pt to therapeutic interventions and notified nursing pt requsting paid medication. Pt also reports she is frustrated she didn't have therapy yesterday and therapist reminds pt of session yesterday. Pt becomes tearful, easy to console and encourage- RN in room.      Exercises General Exercises - Lower Extremity Ankle Circles/Pumps: Seated;AROM;Strengthening;Left;5 reps Quad Sets: Seated;AROM;Strengthening;Left;5 reps Hip ABduction/ADduction: Seated;AAROM;Left;5  reps (LE extended in recliner, requires assist  for reps, educated pt on attempting for isometric contraction)    General Comments General comments (skin integrity, edema, etc.): Pt on RA with SpO2 >92% throughout session      Pertinent Vitals/Pain Pain Assessment: 0-10 Pain Score: 6  Pain Location: L hip with mobility Pain Descriptors / Indicators: Grimacing;Guarding;Moaning;Sore Pain Intervention(s): Limited activity within patient's tolerance;Monitored during session;Repositioned;Patient requesting pain meds-RN notified    Home Living                      Prior Function            PT Goals (current goals can now be found in the care plan section) Acute Rehab PT Goals Patient Stated Goal: "go home right now" PT Goal Formulation: With patient Time For Goal Achievement: 12/11/20 Potential to Achieve Goals: Good Progress towards PT goals: Progressing toward goals    Frequency    Min 3X/week      PT Plan Discharge plan needs to be updated    Co-evaluation              AM-PAC PT "6 Clicks" Mobility   Outcome Measure  Help needed turning from your back to your side while in a flat bed without using bedrails?: A Lot Help needed moving from lying on your back to sitting on the side of a flat bed without using bedrails?: A Lot Help needed moving to and from a bed to a chair (including a wheelchair)?: A Lot Help needed standing up from a chair using your arms (e.g., wheelchair or bedside chair)?: A Lot Help needed to walk in hospital room?: A Lot Help needed climbing 3-5 steps with a railing? : Total 6 Click Score: 11    End of Session Equipment Utilized During Treatment: Gait belt Activity Tolerance: Patient limited by pain Patient left: in chair;with call bell/phone within reach;with chair alarm set Nurse Communication: Mobility status PT Visit Diagnosis: Unsteadiness on feet (R26.81);Other abnormalities of gait and mobility (R26.89);Muscle weakness (generalized) (M62.81);Pain Pain - Right/Left:  Left Pain - part of body: Hip     Time: 2725-3664 PT Time Calculation (min) (ACUTE ONLY): 25 min  Charges:  $Therapeutic Exercise: 8-22 mins $Therapeutic Activity: 8-22 mins                      Tori Rachard Isidro PT, DPT 11/28/20, 12:34 PM

## 2020-11-28 NOTE — Progress Notes (Signed)
     Subjective: 2 Days Post-Op Procedure(s) (LRB): INTRAMEDULLARY (IM) NAIL INTERTROCHANTRIC (Left)   Seen by Dr. Alvan Dame. Patient reports pain as mild, pain controlled.  No reported events throughout the night.  Dr. Alvan Dame discussed the procedure and expectations moving forward.  Orthopaedically stable.  Follow up in the clinic in 2 weeks.        Objective:   VITALS:   Vitals:   11/27/20 2116 11/28/20 0502  BP: 107/62 (!) 99/49  Pulse: 81 78  Resp: 16 16  Temp: 97.8 F (36.6 C) 98.5 F (36.9 C)  SpO2: 98% 97%    Dorsiflexion/Plantar flexion intact Incision: dressing C/D/I No cellulitis present Compartment soft  LABS Recent Labs    11/25/20 1041 11/27/20 0520 11/28/20 0456  HGB 12.3 10.5* 9.0*  HCT 37.7 32.8* 28.2*  WBC 10.3 7.7 6.7  PLT 255 202 176    Recent Labs    11/25/20 1041 11/27/20 0520 11/28/20 0456  NA 140 135 136  K 3.1* 3.8 4.2  BUN 21 13 23   CREATININE 0.59 0.65 0.68  GLUCOSE 152* 128* 137*     Assessment/Plan: 2 Days Post-Op Procedure(s) (LRB): INTRAMEDULLARY (IM) NAIL INTERTROCHANTRIC (Left)  Up with therapy Discharge disposition TBD  Ortho recommendations:  ASA 325 mg bid for 4 weeks for anticoagulation, unless other medically indicated.  Norco for pain management (Rx written).  MiraLax and Colace for constipation  Iron 325 mg tid for 2-3 weeks   WBAT on the left leg.  Dressing to remain in place until follow in clinic in 2 weeks.  Dressing is waterproof and may shower with it in place.  Follow up in 2 weeks at [facility]. Follow up with OLIN,Zyanya Glaza D in 2 weeks.  Contact information:  [facility] [address] [place] [phone]           Danae Orleans PA-C  [facility]  [place] [address], [place], [place], [place] [address] Phone: [phone] [email or web address] Facebook  [employer]

## 2020-11-28 NOTE — Progress Notes (Signed)
PROGRESS NOTE    Andrea Farmer  BSJ:[id number] DOB: [birth year] DOA: 11/25/2020 PCP: Lawerance Cruel, MD   Chief Complaint  Patient presents with  . 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 She was seen in the ED found to have left hip fracture, orthopedic was consulted and admitted Underwent IM intertrochanteric nail 2/16. Working with PT OT and has sustained a skilled nursing facility  Subjective: Seen and examined this morning.  About to start work with PT.  She reports she would be agreeable to f/u PT recs for snf.Initially she voiced that she would like to return home Pain is controlled  Assessment & Plan:  Closed left hip fracture, initial encounter , 2/2 mechanical fall. S/p left intramedullary intertrochanteric nail 2/16.  Continue PT OT.  TOC consult for skilled nursing facility placementseen by ortho plan for follow-up in 2 weeks for wound check and x-ray follow-up.  We will defer pain meds/DVT prophylaxis ( asa 325 mg) Rx to orthopedics  Nonobstructive CAD/pure hypercholesterolemia: On aspirin 325.  Continue Crestor/Zetia.  Hypertension: BP soft this morning I have stopped her  home amlodipine/Benzapril/HCTZ  Anemia likely from ACUTE blood loss in the setting of #1.  Down to 9 g.  Monitor and transfuse if less than 7 g.  Recent Labs  Lab 11/25/20 1041 11/27/20 0520 11/28/20 0456  HGB 12.3 10.5* 9.0*  HCT 37.7 32.8* 28.2*   Diabetes: Blood sugar fairly stable continue home Metformin and sliding scale insulin.   Recent Labs  Lab 11/27/20 1159 11/27/20 1700 11/27/20 2135 11/28/20 0751 11/28/20 1205  GLUCAP 207* 159* 142* 116* 273*   Lab Results  Component Value Date   HGBA1C 7.0 (H) 06/08/2018   Right upper lobe opacity which could represent  right upper lobe infiltrate, right upper lobe nodule/muscle sclerosis in the scapula and could be followed up with CT once surgery and clinically stable.Has no respiratory symptoms and currently on room air.would like CT here-and CT chest was obtained shows probable scarring.  Breast cancer hx possibly continue home anastrozole.  Hypokalemia resolved.  Morbid obesity with BMI > 30:Body mass index is 30.65 kg/m.  Outpatient follow-up with PCP  Nutrition: Diet Order            Diet regular Room service appropriate? Yes; Fluid consistency: Thin  Diet effective now                Pt's Body mass index is 30.65 kg/m.  DVT prophylaxis: SCD.  Chemical prophylaxis postop defer to orthopedics Code Status:   Code Status: Full Code  Family Communication: plan of care discussed with patient at bedside.  Status is: Inpatient Remains inpatient appropriate because:Inpatient level of care appropriate due to severity of illness and for hip fracutre  Dispo:The patient is from: Home wih husband            Anticipated d/c is to: Skilled nursing facility.              Anticipated d/c date is:2 DAYS            Patient currently is not medically stable to d/c.            Difficult to place patient No  Consultants: Orthopedics Cardiology   Procedures:see note  Unresulted Labs (From admission, onward)  Start     Ordered   11/27/20 2947  Basic metabolic panel  Daily,   R     Question:  Specimen collection method  Answer:  Lab=Lab collect   11/26/20 0923   11/27/20 0500  CBC  Daily,   R     Question:  Specimen collection method  Answer:  Lab=Lab collect   11/26/20 0923   11/27/20 0500  CBC  Daily,   R      11/26/20 2047   11/27/20 6546  Basic metabolic panel  Daily,   R      11/26/20 2047         Culture/Microbiology    Component Value Date/Time   SDES URINE, CLEAN CATCH 07/15/2015 1033   SPECREQUEST NONE 07/15/2015 1033   CULT 2,000 COLONIES/mL INSIGNIFICANT GROWTH  07/15/2015 1033   REPTSTATUS 07/16/2015 FINAL 07/15/2015 1033    Other culture-see note  Medications: Scheduled Meds: . anastrozole  1 mg Oral Daily  . aspirin EC  325 mg Oral BID  . calcium-vitamin D  1 tablet Oral Daily  . Chlorhexidine Gluconate Cloth  6 each Topical Daily  . docusate sodium  100 mg Oral BID  . ezetimibe  10 mg Oral Daily  . feeding supplement  237 mL Oral BID BM  . ferrous sulfate  325 mg Oral TID PC  . insulin aspart  0-9 Units Subcutaneous TID WC  . metFORMIN  500 mg Oral Q breakfast  . metoprolol succinate  25 mg Oral Daily  . multivitamin with minerals  1 tablet Oral Daily  . mupirocin ointment  1 application Nasal BID  . omega-3 acid ethyl esters  1 capsule Oral Once per day on Mon Wed Fri  . polyethylene glycol  17 g Oral Daily  . psyllium  1 packet Oral Daily  . rosuvastatin  40 mg Oral Daily  . vitamin E  400 Units Oral QODAY   Continuous Infusions: . methocarbamol (ROBAXIN) IV      Antimicrobials: Anti-infectives (From admission, onward)   Start     Dose/Rate Route Frequency Ordered Stop   11/26/20 2300  ceFAZolin (ANCEF) IVPB 2g/100 mL premix        2 g 200 mL/hr over 30 Minutes Intravenous Every 6 hours 11/26/20 2047 11/27/20 0452   11/26/20 1030  ceFAZolin (ANCEF) IVPB 2g/100 mL premix        2 g 200 mL/hr over 30 Minutes Intravenous On call to O.R. 11/26/20 0935 11/26/20 1753     Objective: Vitals: Today's Vitals   11/27/20 2107 11/27/20 2116 11/28/20 0502 11/28/20 1123  BP:  107/62 (!) 99/49   Pulse:  81 78   Resp:  16 16   Temp:  97.8 F (36.6 C) 98.5 F (36.9 C)   TempSrc:  Oral Oral   SpO2:  98% 97%   Weight:      Height:      PainSc: 3    4     Intake/Output Summary (Last 24 hours) at 11/28/2020 1419 Last data filed at 11/27/2020 1905 Gross per 24 hour  Intake 200 ml  Output -  Net 200 ml   Filed Weights   11/25/20 1906 11/26/20 1613  Weight: 81 kg 81 kg   Weight change:   Intake/Output from previous  day: 02/17 0701 - 02/18 0700 In: 640 [P.O.:640] Out: 120 [Urine:120] Intake/Output this shift: No intake/output data recorded. Filed Weights   11/25/20 1906 11/26/20 1613  Weight: 81 kg 81 kg  Examination: General exam: AAOX3 , NAD, weak appearing. HEENT:Oral mucosa moist, Ear/Nose WNL grossly, dentition normal. Respiratory system: bilaterally clear,no wheezing or crackles,no use of accessory muscle Cardiovascular system: S1 & S2 +, No JVD,. Gastrointestinal system: Abdomen soft, NT,ND, BS+ Nervous System:Alert, awake, moving extremities and grossly nonfocal Extremities: No edema, distal peripheral pulses palpable.  Surgical site with intact dressing with Aquacel. Skin: No rashes,no icterus. MSK: Normal muscle bulk,tone, power  Data Reviewed: I have personally reviewed following labs and imaging studies CBC: Recent Labs  Lab 11/25/20 1041 11/27/20 0520 11/28/20 0456  WBC 10.3 7.7 6.7  NEUTROABS 8.0*  --   --   HGB 12.3 10.5* 9.0*  HCT 37.7 32.8* 28.2*  MCV 94.0 96.2 93.7  PLT 255 202 962   Basic Metabolic Panel: Recent Labs  Lab 11/25/20 1041 11/27/20 0520 11/28/20 0456  NA 140 135 136  K 3.1* 3.8 4.2  CL 102 101 100  CO2 27 25 29   GLUCOSE 152* 128* 137*  BUN 21 13 23   CREATININE 0.59 0.65 0.68  CALCIUM 9.5 8.7* 8.6*   GFR: Estimated Creatinine Clearance: 59.7 mL/min (by C-G formula based on SCr of 0.68 mg/dL). Liver Function Tests: Recent Labs  Lab 11/25/20 1041  AST 24  ALT 19  ALKPHOS 62  BILITOT 0.5  PROT 7.6  ALBUMIN 4.5   No results for input(s): LIPASE, AMYLASE in the last 168 hours. No results for input(s): AMMONIA in the last 168 hours. Coagulation Profile: No results for input(s): INR, PROTIME in the last 168 hours. Cardiac Enzymes: No results for input(s): CKTOTAL, CKMB, CKMBINDEX, TROPONINI in the last 168 hours. BNP (last 3 results) No results for input(s): PROBNP in the last 8760 hours. HbA1C: No results for input(s): HGBA1C in  the last 72 hours. CBG: Recent Labs  Lab 11/27/20 1159 11/27/20 1700 11/27/20 2135 11/28/20 0751 11/28/20 1205  GLUCAP 207* 159* 142* 116* 273*   Lipid Profile: No results for input(s): CHOL, HDL, LDLCALC, TRIG, CHOLHDL, LDLDIRECT in the last 72 hours. Thyroid Function Tests: No results for input(s): TSH, T4TOTAL, FREET4, T3FREE, THYROIDAB in the last 72 hours. Anemia Panel: No results for input(s): VITAMINB12, FOLATE, FERRITIN, TIBC, IRON, RETICCTPCT in the last 72 hours. Sepsis Labs: No results for input(s): PROCALCITON, LATICACIDVEN in the last 168 hours.  Recent Results (from the past 240 hour(s))  Resp Panel by RT-PCR (Flu A&B, Covid) Nasopharyngeal Swab     Status: None   Collection Time: 11/25/20 10:41 AM   Specimen: Nasopharyngeal Swab; Nasopharyngeal(NP) swabs in vial transport medium  Result Value Ref Range Status   SARS Coronavirus 2 by RT PCR NEGATIVE NEGATIVE Final    Comment: (NOTE) SARS-CoV-2 target nucleic acids are NOT DETECTED.  The SARS-CoV-2 RNA is generally detectable in upper respiratory specimens during the acute phase of infection. The lowest concentration of SARS-CoV-2 viral copies this assay can detect is 138 copies/mL. A negative result does not preclude SARS-Cov-2 infection and should not be used as the sole basis for treatment or other patient management decisions. A negative result may occur with  improper specimen collection/handling, submission of specimen other than nasopharyngeal swab, presence of viral mutation(s) within the areas targeted by this assay, and inadequate number of viral copies(<138 copies/mL). A negative result must be combined with clinical observations, patient history, and epidemiological information. The expected result is Negative.  Fact Sheet for Patients:  EntrepreneurPulse.com.au  Fact Sheet for Healthcare Providers:  IncredibleEmployment.be  This test is no t yet approved or  cleared  by the [place] and  has been authorized for detection and/or diagnosis of SARS-CoV-2 by FDA under an Emergency Use Authorization (EUA). This EUA will remain  in effect (meaning this test can be used) for the duration of the COVID-19 declaration under Section 564(b)(1) of the Act, 21 U.S.C.section 360bbb-3(b)(1), unless the authorization is terminated  or revoked sooner.       Influenza A by PCR NEGATIVE NEGATIVE Final   Influenza B by PCR NEGATIVE NEGATIVE Final    Comment: (NOTE) The Xpert Xpress SARS-CoV-2/FLU/RSV plus assay is intended as an aid in the diagnosis of influenza from Nasopharyngeal swab specimens and should not be used as a sole basis for treatment. Nasal washings and aspirates are unacceptable for Xpert Xpress SARS-CoV-2/FLU/RSV testing.  Fact Sheet for Patients: EntrepreneurPulse.com.au  Fact Sheet for Healthcare Providers: IncredibleEmployment.be  This test is not yet approved or cleared by the [place] FDA and has been authorized for detection and/or diagnosis of SARS-CoV-2 by FDA under an Emergency Use Authorization (EUA). This EUA will remain in effect (meaning this test can be used) for the duration of the COVID-19 declaration under Section 564(b)(1) of the Act, 21 U.S.C. section 360bbb-3(b)(1), unless the authorization is terminated or revoked.  Performed at [facility], [place] [address] [place], [place] [address]   Surgical PCR screen     Status: None   Collection Time: 11/25/20 10:20 PM   Specimen: Nasal Mucosa; Nasal Swab  Result Value Ref Range Status   MRSA, PCR NEGATIVE NEGATIVE Final   Staphylococcus aureus NEGATIVE NEGATIVE Final    Comment: (NOTE) The Xpert SA Assay (FDA approved for NASAL specimens in patients 62 years of age and older), is one component of a comprehensive surveillance program. It is not intended to diagnose infection nor to guide or monitor  treatment. Performed at [facility], [place] [address] [place], [place] [address]      Radiology Studies: CT CHEST WO CONTRAST  Result Date: 11/27/2020 CLINICAL DATA:  Abnormal x-ray. EXAM: CT CHEST WITHOUT CONTRAST TECHNIQUE: Multidetector CT imaging of the chest was performed following the standard protocol without IV contrast. COMPARISON:  November 25, 2020.  Mar 04, 2020. FINDINGS: Cardiovascular: Atherosclerosis of thoracic aorta is noted without aneurysm formation. Normal cardiac size. No pericardial effusion. Coronary artery calcifications are noted. Mediastinum/Nodes: No enlarged mediastinal or axillary lymph nodes. Thyroid gland, trachea, and esophagus demonstrate no significant findings. Lungs/Pleura: No pneumothorax or pleural effusion is noted. Minimal bibasilar scarring is noted. Peripheral scarring is noted anteriorly and laterally in the right upper lobe. No definite acute abnormality is noted. Upper Abdomen: No acute abnormality. Musculoskeletal: No chest wall mass or suspicious bone lesions identified. IMPRESSION: 1. Coronary artery calcifications are noted suggesting coronary artery disease. 2. Minimal bibasilar scarring is noted. Peripheral scarring is noted anteriorly and laterally in the right upper lobe. No definite acute abnormality is noted in the chest. 3. Aortic atherosclerosis. Aortic Atherosclerosis (ICD10-I70.0). Electronically Signed   By: Marijo Conception M.D.   On: 11/27/2020 12:48   DG C-Arm 1-60 Min-No Report  Result Date: 11/26/2020 Fluoroscopy was utilized by the requesting physician.  No radiographic interpretation.   DG HIP OPERATIVE UNILAT W OR W/O PELVIS LEFT  Result Date: 11/26/2020 CLINICAL DATA:  Elective surgery. EXAM: OPERATIVE LEFT HIP (WITH PELVIS IF PERFORMED) TECHNIQUE: Fluoroscopic spot image(s) were submitted for interpretation post-operatively. COMPARISON:  Preoperative radiograph yesterday. FINDINGS: Five fluoroscopic spot views  of the left hip obtained in the operating  room. Intertrochanteric femur fracture with subsequent femoral intramedullary nail, distal locking and trans trochanteric screw fixation. Total fluoroscopy time 48 seconds. IMPRESSION: Procedural fluoroscopy during ORIF left proximal femur fracture. Electronically Signed   By: Keith Rake M.D.   On: 11/26/2020 19:41     LOS: 3 days   Antonieta Pert, MD [facility]  11/28/2020, 2:19 PM

## 2020-11-28 NOTE — TOC Initial Note (Addendum)
Transition of Care [facility]) - Initial/Assessment Note    Patient Details  Name: Andrea Farmer MRN: [id number] Date of Birth: [birth year]  Transition of Care [facility]) CM/SW Contact:    Lynnell Catalan, RN Phone Number: 11/28/2020, 2:32 PM  Clinical Narrative:              Pt from home with spouse. She is s/p L hip surgery. Permission given to fax out FL2 to area SNFs. TOC will follow with SNF bed offers. Insurance auth to be started for SNF and for ambulance transport with [facility].  Expected Discharge Plan: Skilled Nursing Facility Barriers to Discharge: Continued Medical Work up   Patient Goals and CMS Choice Patient states their goals for this hospitalization and ongoing recovery are:: To go home      Expected Discharge Plan and Services Expected Discharge Plan: [place] arrangements for the past 2 months: Single Family Home                                      Prior Living Arrangements/Services Living arrangements for the past 2 months: Single Family Home Lives with:: Spouse Patient language and need for interpreter reviewed:: Yes        Need for Family Participation in Patient Care: Yes (Comment) Care giver support system in place?: Yes (comment)   Criminal Activity/Legal Involvement Pertinent to Current Situation/Hospitalization: No - Comment as needed  Activities of Daily Living Home Assistive Devices/Equipment: Blood pressure cuff,CBG Meter ADL Screening (condition at time of admission) Patient's cognitive ability adequate to safely complete daily activities?: Yes Is the patient deaf or have difficulty hearing?: No Does the patient have difficulty seeing, even when wearing glasses/contacts?: No Does the patient have difficulty concentrating, remembering, or making decisions?: No Patient able to express need for assistance with ADLs?: Yes Does the patient have difficulty dressing or bathing?: Yes Independently performs ADLs?:  No Communication: Independent Dressing (OT): Needs assistance Is this a change from baseline?: Change from baseline, expected to last >3 days Grooming: Independent Feeding: Needs assistance Is this a change from baseline?: Change from baseline, expected to last >3 days Bathing: Needs assistance Is this a change from baseline?: Change from baseline, expected to last >3 days Toileting: Dependent Is this a change from baseline?: Change from baseline, expected to last >3days In/Out Bed: Dependent Is this a change from baseline?: Change from baseline, expected to last >3 days Walks in Home: Dependent Is this a change from baseline?: Change from baseline, expected to last >3 days Does the patient have difficulty walking or climbing stairs?: Yes (secondary to right hip fracture) Weakness of Legs: Right Weakness of Arms/Hands: None  Permission Sought/Granted                  Emotional Assessment Appearance:: Appears stated age Attitude/Demeanor/Rapport: Engaged Affect (typically observed): Calm Orientation: : Oriented to Self,Oriented to Place,Oriented to  Time,Oriented to Situation Alcohol / Substance Use: Not Applicable Psych Involvement: No (comment)  Admission diagnosis:  Hypokalemia [E87.6] Closed left hip fracture, initial encounter [facility]) [S72.002A] Patient Active Problem List   Diagnosis Date Noted  . Closed left hip fracture, initial encounter ([place]) 11/25/2020  . Malignant neoplasm of lower-outer quadrant of right breast of female, estrogen receptor positive ([place]) 03/12/2020  . Abnormal computed tomography angiography (CTA) 04/08/2016  . Abnormal stress test 04/02/2016  . Cardiac ischemia 04/02/2016  .  Pure hypercholesterolemia 03/11/2016  . Abdominal distension (gaseous) 03/11/2016  . Abdominal pain, epigastric 03/11/2016  . Environmental and seasonal allergies 03/11/2016  . Anal or rectal pain 03/11/2016  . Dyspepsia 03/11/2016  . Other abnormal glucose 03/11/2016   . Essential (primary) hypertension 03/11/2016  . Cardiac arrhythmia 03/11/2016  . Irritable bowel syndrome 03/11/2016  . Osteoarthritis of knee 03/11/2016  . Osteopenia 03/11/2016  . Pain in joint, lower leg 03/11/2016  . History of colonic polyps 03/11/2016  . Slow transit constipation 03/11/2016  . PVC (premature ventricular contraction) 03/11/2016  . SOB (shortness of breath) 03/11/2016  . 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 Hemorrhoid 12/07/2010   PCP:  Lawerance Cruel, MD Pharmacy:   [id number] [address], [place] [address] Phone: [phone] Fax: [phone]     Social Determinants of Health (SDOH) Interventions    Readmission Risk Interventions No flowsheet data found.

## 2020-11-28 NOTE — Care Management Important Message (Signed)
Important Message  Patient Details IM Letter given to the Patient. Name: Andrea Farmer MRN: [id number] Date of Birth: [birth year]   Medicare Important Message Given:  Yes     Kerin Salen 11/28/2020, 9:53 AM

## 2020-11-28 NOTE — NC FL2 (Signed)
[place] LEVEL OF CARE SCREENING TOOL     IDENTIFICATION  Patient Name: Andrea Farmer Birthdate: 1941/05/30 Sex: female Admission Date (Current Location): 11/25/2020  [facility] and [place] Number:  Herbalist and Address:  [facility],  [place] [address], [place]      Provider Number: [id number]  Attending Physician Name and Address:  Antonieta Pert, MD  Relative Name and Phone Number:       Current Level of Care: Hospital Recommended Level of Care: [place] Prior Approval Number:    Date Approved/Denied:   PASRR Number: [id number]  Discharge Plan: SNF    Current Diagnoses: Patient Active Problem List   Diagnosis Date Noted  . Closed left hip fracture, initial encounter ([place]) 11/25/2020  . Malignant neoplasm of lower-outer quadrant of right breast of female, estrogen receptor positive ([place]) 03/12/2020  . Abnormal computed tomography angiography (CTA) 04/08/2016  . Abnormal stress test 04/02/2016  . Cardiac ischemia 04/02/2016  . Pure hypercholesterolemia 03/11/2016  . Abdominal distension (gaseous) 03/11/2016  . Abdominal pain, epigastric 03/11/2016  . Environmental and seasonal allergies 03/11/2016  . Anal or rectal pain 03/11/2016  . Dyspepsia 03/11/2016  . Other abnormal glucose 03/11/2016  . Essential (primary) hypertension 03/11/2016  . Cardiac arrhythmia 03/11/2016  . Irritable bowel syndrome 03/11/2016  . Osteoarthritis of knee 03/11/2016  . Osteopenia 03/11/2016  . Pain in joint, lower leg 03/11/2016  . History of colonic polyps 03/11/2016  . Slow transit constipation 03/11/2016  . PVC (premature ventricular contraction) 03/11/2016  . SOB (shortness of breath) 03/11/2016  . 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 Hemorrhoid 12/07/2010    Orientation RESPIRATION BLADDER Height & Weight     Self,Time,Situation,Place  Normal Incontinent Weight: 81  kg Height:  5\' 4"  (162.6 cm)  BEHAVIORAL SYMPTOMS/MOOD NEUROLOGICAL BOWEL NUTRITION STATUS      Continent Diet (Regular)  AMBULATORY STATUS COMMUNICATION OF NEEDS Skin   Extensive Assist Verbally Surgical wounds                       Personal Care Assistance Level of Assistance  Bathing,Dressing Bathing Assistance: Limited assistance   Dressing Assistance: Limited assistance     Functional Limitations Info  Sight,Hearing Sight Info: Adequate Hearing Info: Adequate      SPECIAL CARE FACTORS FREQUENCY  OT (By licensed OT),PT (By licensed PT)     PT Frequency: 5 x weekly OT Frequency: 5 x weekly            Contractures Contractures Info: Not present    Additional Factors Info  Code Status,Allergies Code Status Info: Full Allergies Info: Tramadol           Current Medications (11/28/2020):  This is the current hospital active medication list Current Facility-Administered Medications  Medication Dose Route Frequency Provider Last Rate Last Admin  . acetaminophen (TYLENOL) tablet 325-650 mg  325-650 mg Oral Q6H PRN Maurice March, PA-C   650 mg at 11/28/20 1038  . anastrozole (ARIMIDEX) tablet 1 mg  1 mg Oral Daily Kc, Ramesh, MD   1 mg at 11/28/20 1006  . aspirin EC tablet 325 mg  325 mg Oral BID Maurice March, PA-C   325 mg at 11/28/20 1005  . calcium-vitamin D (OSCAL WITH D) 500-200 MG-UNIT per tablet 1 tablet  1 tablet Oral Daily Kc, Ramesh, MD   1 tablet at 11/28/20 1006  .  Chlorhexidine Gluconate Cloth 2 % PADS 6 each  6 each Topical Daily Antonieta Pert, MD   6 each at 11/28/20 1006  . docusate sodium (COLACE) capsule 100 mg  100 mg Oral BID Maurice March, PA-C   100 mg at 11/28/20 1005  . ezetimibe (ZETIA) tablet 10 mg  10 mg Oral Daily Maurice March, PA-C   10 mg at 11/28/20 1005  . feeding supplement (ENSURE SURGERY) liquid 237 mL  237 mL Oral BID BM Maurice March, PA-C   237 mL at 11/28/20 1006  . ferrous sulfate tablet 325 mg  325 mg Oral  TID PC Maurice March, PA-C   325 mg at 11/28/20 1215  . fluticasone (FLONASE) 50 MCG/ACT nasal spray 1 spray  1 spray Each Nare Daily PRN Kc, Ramesh, MD      . HYDROcodone-acetaminophen (NORCO) 7.5-325 MG per tablet 1-2 tablet  1-2 tablet Oral Q4H PRN Maurice March, PA-C      . HYDROcodone-acetaminophen (NORCO/VICODIN) 5-325 MG per tablet 1-2 tablet  1-2 tablet Oral Q4H PRN Maurice March, PA-C   2 tablet at 11/27/20 1536  . insulin aspart (novoLOG) injection 0-9 Units  0-9 Units Subcutaneous TID WC Maurice March, PA-C   5 Units at 11/28/20 1215  . menthol-cetylpyridinium (CEPACOL) lozenge 3 mg  1 lozenge Oral PRN Griffith Citron R, PA-C       Or  . phenol (CHLORASEPTIC) mouth spray 1 spray  1 spray Mouth/Throat PRN Maurice March, PA-C      . metFORMIN (GLUCOPHAGE) tablet 500 mg  500 mg Oral Q breakfast Kc, Ramesh, MD   500 mg at 11/28/20 1005  . methocarbamol (ROBAXIN) tablet 500 mg  500 mg Oral Q6H PRN Maurice March, PA-C   500 mg at 11/28/20 1416   Or  . methocarbamol (ROBAXIN) 500 mg in dextrose 5 % 50 mL IVPB  500 mg Intravenous Q6H PRN Maurice March, PA-C      . metoCLOPramide (REGLAN) tablet 5-10 mg  5-10 mg Oral Q8H PRN Griffith Citron R, PA-C       Or  . metoCLOPramide (REGLAN) injection 5-10 mg  5-10 mg Intravenous Q8H PRN Maurice March, PA-C   5 mg at 11/26/20 2234  . metoprolol succinate (TOPROL-XL) 24 hr tablet 25 mg  25 mg Oral Daily Maurice March, PA-C   25 mg at 11/28/20 1005  . metoprolol tartrate (LOPRESSOR) injection 5 mg  5 mg Intravenous Q8H PRN Maurice March, PA-C      . morphine 2 MG/ML injection 0.5-1 mg  0.5-1 mg Intravenous Q2H PRN Maurice March, PA-C      . multivitamin with minerals tablet 1 tablet  1 tablet Oral Daily Maurice March, PA-C   1 tablet at 11/28/20 1005  . mupirocin ointment (BACTROBAN) 2 % 1 application  1 application Nasal BID Maurice March, PA-C   1 application at 54/65/68 1011  . omega-3 acid ethyl  esters (LOVAZA) capsule 1 g  1 capsule Oral Once per day on Mon Wed Fri Kc, Ramesh, MD   1 g at 11/28/20 1021  . ondansetron (ZOFRAN) tablet 4 mg  4 mg Oral Q6H PRN Maurice March, PA-C   4 mg at 11/26/20 2104   Or  . ondansetron (ZOFRAN) injection 4 mg  4 mg Intravenous Q6H PRN Griffith Citron R, PA-C      . polyethylene glycol (MIRALAX / GLYCOLAX) packet 17 g  17 g Oral Daily PRN Maurice March, PA-C   17 g at 11/27/20 1135  . polyethylene glycol (MIRALAX / GLYCOLAX) packet 17 g  17 g Oral Daily Kc, Ramesh, MD   17 g at 11/28/20 1005  . psyllium (HYDROCIL/METAMUCIL) 1 packet  1 packet Oral Daily Antonieta Pert, MD   1 packet at 11/28/20 1005  . rosuvastatin (CRESTOR) tablet 40 mg  40 mg Oral Daily Maurice March, PA-C   40 mg at 11/28/20 1004  . vitamin E capsule 400 Units  400 Units Oral Edwena Felty, MD   400 Units at 11/27/20 1135     Discharge Medications: Please see discharge summary for a list of discharge medications.  Relevant Imaging Results:  Relevant Lab Results:   Additional Information SS# [id number] Covid vaccinated, s/p ORIF with L femur intramedullary nail on 11/26/20 WBAT  Odalis Jordan, Marjie Skiff, RN

## 2020-11-29 LAB — BASIC METABOLIC PANEL
Anion gap: 8 (ref 5–15)
BUN: 16 mg/dL (ref 8–23)
CO2: 28 mmol/L (ref 22–32)
Calcium: 8.4 mg/dL — ABNORMAL LOW (ref 8.9–10.3)
Chloride: 101 mmol/L (ref 98–111)
Creatinine, Ser: 0.55 mg/dL (ref 0.44–1.00)
GFR, Estimated: >60 mL/min (ref >60)
Glucose, Bld: 108 mg/dL — ABNORMAL HIGH (ref 70–99)
Potassium: 4 mmol/L (ref 3.5–5.1)
Sodium: 137 mmol/L (ref 135–145)

## 2020-11-29 LAB — CBC
HCT: 27.8 % — ABNORMAL LOW (ref 36.0–46.0)
Hemoglobin: 8.9 g/dL — ABNORMAL LOW (ref 12.0–15.0)
MCH: 30.5 pg (ref 26.0–34.0)
MCHC: 32 g/dL (ref 30.0–36.0)
MCV: 95.2 fL (ref 80.0–100.0)
Platelets: 190 10*3/uL (ref 150–400)
RBC: 2.92 MIL/uL — ABNORMAL LOW (ref 3.87–5.11)
RDW: 13.7 % (ref 11.5–15.5)
WBC: 6.6 10*3/uL (ref 4.0–10.5)
nRBC: 0 % (ref 0.0–0.2)

## 2020-11-29 LAB — GLUCOSE, CAPILLARY
Glucose-Capillary: 100 mg/dL — ABNORMAL HIGH (ref 70–99)
Glucose-Capillary: 127 mg/dL — ABNORMAL HIGH (ref 70–99)
Glucose-Capillary: 156 mg/dL — ABNORMAL HIGH (ref 70–99)

## 2020-11-29 LAB — SARS CORONAVIRUS 2 (TAT 6-24 HRS): SARS Coronavirus 2: NEGATIVE

## 2020-11-29 MED ORDER — METOPROLOL SUCCINATE ER 25 MG PO TB24: 25.0000 mg | ORAL_TABLET | Freq: Every day | ORAL | 2 refills | Status: DC

## 2020-11-29 MED ORDER — BISACODYL 10 MG RE SUPP: 10.0000 mg | Freq: Every day | RECTAL | 0 refills | Status: DC | PRN

## 2020-11-29 MED ORDER — SODIUM CHLORIDE 0.9 % IV BOLUS
1000.0000 mL | Freq: Once | INTRAVENOUS | Status: AC
  Administered 2020-11-29: 1000 mL via INTRAVENOUS

## 2020-11-29 MED ORDER — LOPERAMIDE HCL 2 MG PO CAPS
4.0000 mg | ORAL_CAPSULE | Freq: Once | ORAL | Status: AC
  Administered 2020-11-29: 4 mg via ORAL
  Filled 2020-11-29: qty 2

## 2020-11-29 MED ORDER — SORBITOL 70 % SOLN
960.0000 mL | TOPICAL_OIL | Freq: Once | ORAL | Status: AC
  Administered 2020-11-29: 960 mL via RECTAL
  Filled 2020-11-29: qty 473

## 2020-11-29 MED ORDER — FERROUS SULFATE 325 (65 FE) MG PO TABS: 325.0000 mg | ORAL_TABLET | Freq: Three times a day (TID) | ORAL | 3 refills | Status: DC

## 2020-11-29 MED ORDER — DOCUSATE SODIUM 100 MG PO CAPS: 100.0000 mg | ORAL_CAPSULE | Freq: Two times a day (BID) | ORAL | 0 refills | Status: DC

## 2020-11-29 MED ORDER — METHOCARBAMOL 500 MG PO TABS: 500.0000 mg | ORAL_TABLET | Freq: Four times a day (QID) | ORAL | Status: DC | PRN

## 2020-11-29 MED ORDER — METOPROLOL SUCCINATE ER 25 MG PO TB24
12.5000 mg | ORAL_TABLET | Freq: Every day | ORAL | Status: DC
  Administered 2020-11-30: 12.5 mg via ORAL
  Filled 2020-11-29: qty 1

## 2020-11-29 NOTE — Discharge Summary (Signed)
Physician Discharge Summary  Andrea Farmer BSW:[id number] DOB: 08-19-1941 DOA: 11/25/2020  PCP: Lawerance Cruel, MD  Admit date: 11/25/2020 Discharge date: 11/30/2020  Admitted From: Home Disposition: [facility]    Recommendations for Outpatient Follow-up:  1. Follow up with PCP in 1-2 weeks 2. Follow-up orthopedics, Dr. Alvan Dame 2 weeks for wound check 3. Aspirin 325 mg twice daily x4 weeks for postoperative DVT prophylaxis 4. Continue iron supplement x4 weeks for iron deficiency anemia 5. Discontinued amlodipine/benazepril/HCTZ 6. Started on metoprolol succinate 25 mg p.o. daily  Discharge Condition: Stable CODE STATUS: Full code Diet recommendation: Heart healthy diet  History of present illness:  Andrea Farmer is a 80 year old female with past medical history significant for essential hypertension, right total knee arthroplasty 2017, type 2 diabetes mellitus, hyperlipidemia, breast cancer on anastrozole, history of SVT/NSVT, mild MR, CAD per coronary CTA who presented to the ED with left hip pain following mechanical fall at home.  Upon presentation she was found to have a left hip fracture on imaging studies.  Orthopedics was consulted and hospitalist consulted for further evaluation and management of acute left hip fracture.  Hospital course:  Closed left hip fracture, initial encounter Patient presenting to ED following mechanical fall at home.  Patient was found to have acute left hip fracture on imaging.  Orthopedics was consulted and patient underwent intramedullary intertrochanteric nail on 11/26/2020 by Dr. Alvan Dame.  Patient on aspirin 325 mg p.o. twice daily x4 weeks for DVT prophylaxis.  Weightbearing as tolerates.  Norco, Tylenol, Robaxin for pain control/muscle spasms as needed.  Outpatient follow-up with Dr. Alvan Dame 2 weeks.  CAD Hypercholesterolemia Continue home Crestor, Zetia, aspirin.  Essential hypertension Hx SVT/NSVT Blood pressures were monitored during hospitalization  which were borderline low.  Discontinued home amlodipine, benazepril and HCTZ.  Continue metoprolol succinate 12.5 mg p.o. daily.  Follow blood pressures closely and restart antihypertensives as needed.  Postoperative blood loss anemia Hemoglobin trended down to 8.9 during hospitalization, stable.  Continue ferrous sulfate 325 mg p.o. 3 times daily x3-4 weeks per orthopedics.  Recommend repeat CBC 1 week.  Type 2 diabetes mellitus Continue home Metformin  Right upper lobe opacity CT chest notable for probable scarring.  Outpatient follow-up.  Breast cancer Continue home anastrozole  Hypokalemia Repleted during hospitalization.  Potassium 4.0 at time of discharge.  Morbid obesity Body mass index is 30.65 kg/m.  Discussed with patient needs for aggressive lifestyle changes/weight loss as this complicates all facets of care.  Outpatient follow-up with PCP.  May benefit from bariatric evaluation outpatient.   Discharge Diagnoses:  Principal Problem:   Closed left hip fracture, initial encounter ([place]) Active Problems:   Pure hypercholesterolemia   Essential (primary) hypertension   Malignant neoplasm of lower-outer quadrant of right breast of female, estrogen receptor positive [facility])    Discharge Instructions  Discharge Instructions    Call MD for:  difficulty breathing, headache or visual disturbances   Complete by: As directed    Call MD for:  extreme fatigue   Complete by: As directed    Call MD for:  persistant dizziness or light-headedness   Complete by: As directed    Call MD for:  persistant nausea and vomiting   Complete by: As directed    Call MD for:  severe uncontrolled pain   Complete by: As directed    Call MD for:  temperature >100.4   Complete by: As directed    Diet - low sodium heart healthy   Complete by:  As directed    Increase activity slowly   Complete by: As directed    Increase activity slowly   Complete by: As directed    No wound care   Complete  by: As directed    No wound care   Complete by: As directed    Weight bearing as tolerated   Complete by: As directed    Laterality: left   Extremity: Lower     Allergies as of 11/30/2020      Reactions   Tramadol    Other reaction(s): itching      Medication List    STOP taking these medications   amLODipine-benazepril 5-40 MG capsule Commonly known as: LOTREL   hydrochlorothiazide 25 MG tablet Commonly known as: HYDRODIURIL   potassium chloride 10 MEQ tablet Commonly known as: KLOR-CON   traMADol 50 MG tablet Commonly known as: ULTRAM     TAKE these medications   acetaminophen 500 MG tablet Commonly known as: TYLENOL Take 1,000 mg by mouth every 6 (six) hours as needed for moderate pain.   Align 4 MG Caps Take 4 mg by mouth daily.   anastrozole 1 MG tablet Commonly known as: ARIMIDEX Take 1 tablet (1 mg total) by mouth daily.   aspirin 325 MG EC tablet Take 1 tablet (325 mg total) by mouth 2 (two) times daily. What changed:   medication strength  how much to take  when to take this  additional instructions   bisacodyl 10 MG suppository Commonly known as: DULCOLAX Place 1 suppository (10 mg total) rectally daily as needed for moderate constipation.   Calcium Carbonate-Vitamin D 600-400 MG-UNIT tablet Take 1 tablet by mouth 3 (three) times a week.   CLEAR EYES COMPLETE OP Place 1 drop into both eyes daily as needed (dry eyes).   CO Q 10 PO Take 200 mg by mouth every other day.   docusate sodium 100 MG capsule Commonly known as: COLACE Take 1 capsule (100 mg total) by mouth 2 (two) times daily.   ezetimibe 10 MG tablet Commonly known as: ZETIA Take 1 tablet (10 mg total) by mouth daily. FOLLOW UP DUE IN MAY 2022. PLEASE CALL AND SCHEDULE What changed: additional instructions   ferrous sulfate 325 (65 FE) MG tablet Take 1 tablet (325 mg total) by mouth 3 (three) times daily after meals for 28 days.   GLUCOSAMINE 1500 COMPLEX PO Take 1  tablet by mouth every other day.   HYDROcodone-acetaminophen 5-325 MG tablet Commonly known as: Norco Take 1-2 tablets by mouth every 6 (six) hours as needed for moderate pain or severe pain.   metFORMIN 500 MG tablet Commonly known as: GLUCOPHAGE Take 500 mg by mouth daily.   methocarbamol 500 MG tablet Commonly known as: ROBAXIN Take 1 tablet (500 mg total) by mouth every 6 (six) hours as needed for muscle spasms.   metoprolol succinate 25 MG 24 hr tablet Commonly known as: TOPROL-XL Take 0.5 tablets (12.5 mg total) by mouth daily.   mometasone 50 MCG/ACT nasal spray Commonly known as: NASONEX Place 2 sprays into the nose 3 (three) times a week.   multivitamin with minerals Tabs tablet Take 1 tablet by mouth 3 (three) times a week.   OMEGA 3 PO Take 1 capsule by mouth 3 (three) times a week.   polyethylene glycol 17 g packet Commonly known as: MIRALAX / GLYCOLAX Take 17 g by mouth daily.   psyllium 95 % Pack Commonly known as: HYDROCIL/METAMUCIL Take 1 packet by mouth  daily.   rosuvastatin 40 MG tablet Commonly known as: CRESTOR TAKE ONE TABLET BY MOUTH ONCE DAILY   TURMERIC PO Take 500 mg by mouth every other day.   vitamin E 180 MG (400 UNITS) capsule Take 400 Units by mouth every other day.            Discharge Care Instructions  (From admission, onward)         Start     Ordered   11/28/20 0000  Weight bearing as tolerated       Question Answer Comment  Laterality left   Extremity Lower      11/28/20 1603          Contact information for follow-up providers    Paralee Cancel, MD. Schedule an appointment as soon as possible for a visit in 2 weeks.   Specialty: Orthopedic Surgery Contact information: [address] [place] [address] [place] [address] [phone]        Lawerance Cruel, MD. Schedule an appointment as soon as possible for a visit in 1 week(s).   Specialty: Family Medicine Contact information: [place] [address] [phone]            Contact information for after-discharge care    Destination    [facility] .   Service: Skilled Nursing Contact information: [place] [phone]                 Allergies  Allergen Reactions  . Tramadol     Other reaction(s): itching    Consultations:  Orthopedics, Dr. Alvan Dame   Procedures/Studies: DG Chest 1 View  Result Date: 11/25/2020 CLINICAL DATA:  LEFT hip fracture post fall EXAM: CHEST  1 VIEW COMPARISON:  12/19/2019 FINDINGS: Upper normal heart size. Mediastinal contours and pulmonary vascularity normal. Focal opacity lateral RIGHT upper lobe question infiltrate, subtle mass, or sclerosis in the RIGHT scapula. Lungs otherwise clear. No infiltrate, pleural effusion, or pneumothorax. Surgical clips RIGHT axilla. No additional osseous findings. IMPRESSION: RIGHT upper lobe opacity which could represent RIGHT upper lobe infiltrate, RIGHT upper lobe nodule/mass, or sclerosis in the RIGHT scapula; this could be assessed by follow-up CT when clinically able. Electronically Signed   By: Lavonia Dana M.D.   On: 11/25/2020 10:22   CT CHEST WO CONTRAST  Result Date: 11/27/2020 CLINICAL DATA:  Abnormal x-ray. EXAM: CT CHEST WITHOUT CONTRAST TECHNIQUE: Multidetector CT imaging of the chest was performed following the standard protocol without IV contrast. COMPARISON:  November 25, 2020.  Mar 04, 2020. FINDINGS: Cardiovascular: Atherosclerosis of thoracic aorta is noted without aneurysm formation. Normal cardiac size. No pericardial effusion. Coronary artery calcifications are noted. Mediastinum/Nodes: No enlarged mediastinal or axillary lymph nodes. Thyroid gland, trachea, and esophagus demonstrate no significant findings. Lungs/Pleura: No pneumothorax or pleural effusion is noted. Minimal bibasilar scarring is noted. Peripheral scarring is noted  anteriorly and laterally in the right upper lobe. No definite acute abnormality is noted. Upper Abdomen: No acute abnormality. Musculoskeletal: No chest wall mass or suspicious bone lesions identified. IMPRESSION: 1. Coronary artery calcifications are noted suggesting coronary artery disease. 2. Minimal bibasilar scarring is noted. Peripheral scarring is noted anteriorly and laterally in the right upper lobe. No definite acute abnormality is noted in the chest. 3. Aortic atherosclerosis. Aortic Atherosclerosis (ICD10-I70.0). Electronically Signed   By: Marijo Conception M.D.   On: 11/27/2020 12:48   DG C-Arm 1-60 Min-No Report  Result Date:  11/26/2020 Fluoroscopy was utilized by the requesting physician.  No radiographic interpretation.   DG HIP OPERATIVE UNILAT W OR W/O PELVIS LEFT  Result Date: 11/26/2020 CLINICAL DATA:  Elective surgery. EXAM: OPERATIVE LEFT HIP (WITH PELVIS IF PERFORMED) TECHNIQUE: Fluoroscopic spot image(s) were submitted for interpretation post-operatively. COMPARISON:  Preoperative radiograph yesterday. FINDINGS: Five fluoroscopic spot views of the left hip obtained in the operating room. Intertrochanteric femur fracture with subsequent femoral intramedullary nail, distal locking and trans trochanteric screw fixation. Total fluoroscopy time 48 seconds. IMPRESSION: Procedural fluoroscopy during ORIF left proximal femur fracture. Electronically Signed   By: Keith Rake M.D.   On: 11/26/2020 19:41   DG Hip Unilat With Pelvis 2-3 Views Left  Result Date: 11/25/2020 CLINICAL DATA:  Tripped over a table leg at work and fell landing on LEFT side, LEFT hip pain EXAM: DG HIP (WITH OR WITHOUT PELVIS) 2-3V LEFT COMPARISON:  None FINDINGS: Osseous demineralization. Hip and SI joint spaces preserved. Displaced intertrochanteric fracture LEFT femur. No dislocation. Pelvis intact. IMPRESSION: Displaced intertrochanteric fracture LEFT femur. Electronically Signed   By: Lavonia Dana M.D.   On:  11/25/2020 10:19     Subjective: Patient seen and examined at bedside, resting comfortably.  Patient with several bowel movements following enema yesterday.  No other questions or concerns at this time.  Discharging to SNF today.  Denies headache, no fever/chills/night sweats, no nausea/vomiting, no chest pain, no palpitations, no shortness of breath, no abdominal pain, no weakness, no fatigue, no paresthesias.  No acute events overnight per nursing staff.  Discharge Exam: Vitals:   11/29/20 2022 11/30/20 0457  BP: 126/66 130/69  Pulse: 81 89  Resp: 18 18  Temp: 98.2 F (36.8 C) 98.3 F (36.8 C)  SpO2: 96% 95%   Vitals:   11/29/20 1330 11/29/20 1636 11/29/20 2022 11/30/20 0457  BP: (!) 87/53 112/64 126/66 130/69  Pulse: 77 70 81 89  Resp: 15  18 18   Temp: 97.8 F (36.6 C)  98.2 F (36.8 C) 98.3 F (36.8 C)  TempSrc: Oral  Oral Oral  SpO2: 95% 96% 96% 95%  Weight:      Height:        General: Pt is alert, awake, not in acute distress Cardiovascular: RRR, S1/S2 +, no rubs, no gallops Respiratory: CTA bilaterally, no wheezing, no rhonchi, oxygenating well on room air Abdominal: Soft, NT, ND, bowel sounds + Extremities: Slight edema surrounding surgical site with ecchymosis, surgical dressing noted, clean/dry/intact    The results of significant diagnostics from this hospitalization (including imaging, microbiology, ancillary and laboratory) are listed below for reference.     Microbiology: Recent Results (from the past 240 hour(s))  Resp Panel by RT-PCR (Flu A&B, Covid) Nasopharyngeal Swab     Status: None   Collection Time: 11/25/20 10:41 AM   Specimen: Nasopharyngeal Swab; Nasopharyngeal(NP) swabs in vial transport medium  Result Value Ref Range Status   SARS Coronavirus 2 by RT PCR NEGATIVE NEGATIVE Final    Comment: (NOTE) SARS-CoV-2 target nucleic acids are NOT DETECTED.  The SARS-CoV-2 RNA is generally detectable in upper respiratory specimens during the  acute phase of infection. The lowest concentration of SARS-CoV-2 viral copies this assay can detect is 138 copies/mL. A negative result does not preclude SARS-Cov-2 infection and should not be used as the sole basis for treatment or other patient management decisions. A negative result may occur with  improper specimen collection/handling, submission of specimen other than nasopharyngeal swab, presence of viral mutation(s) within the areas  targeted by this assay, and inadequate number of viral copies(<138 copies/mL). A negative result must be combined with clinical observations, patient history, and epidemiological information. The expected result is Negative.  Fact Sheet for Patients:  EntrepreneurPulse.com.au  Fact Sheet for Healthcare Providers:  IncredibleEmployment.be  This test is no t yet approved or cleared by the [place] FDA and  has been authorized for detection and/or diagnosis of SARS-CoV-2 by FDA under an Emergency Use Authorization (EUA). This EUA will remain  in effect (meaning this test can be used) for the duration of the COVID-19 declaration under Section 564(b)(1) of the Act, 21 U.S.C.section 360bbb-3(b)(1), unless the authorization is terminated  or revoked sooner.       Influenza A by PCR NEGATIVE NEGATIVE Final   Influenza B by PCR NEGATIVE NEGATIVE Final    Comment: (NOTE) The Xpert Xpress SARS-CoV-2/FLU/RSV plus assay is intended as an aid in the diagnosis of influenza from Nasopharyngeal swab specimens and should not be used as a sole basis for treatment. Nasal washings and aspirates are unacceptable for Xpert Xpress SARS-CoV-2/FLU/RSV testing.  Fact Sheet for Patients: EntrepreneurPulse.com.au  Fact Sheet for Healthcare Providers: IncredibleEmployment.be  This test is not yet approved or cleared by the [place] FDA and has been authorized for detection and/or  diagnosis of SARS-CoV-2 by FDA under an Emergency Use Authorization (EUA). This EUA will remain in effect (meaning this test can be used) for the duration of the COVID-19 declaration under Section 564(b)(1) of the Act, 21 U.S.C. section 360bbb-3(b)(1), unless the authorization is terminated or revoked.  Performed at [facility], [place] [address], [place], [place] [address]   Surgical PCR screen     Status: None   Collection Time: 11/25/20 10:20 PM   Specimen: Nasal Mucosa; Nasal Swab  Result Value Ref Range Status   MRSA, PCR NEGATIVE NEGATIVE Final   Staphylococcus aureus NEGATIVE NEGATIVE Final    Comment: (NOTE) The Xpert SA Assay (FDA approved for NASAL specimens in patients 69 years of age and older), is one component of a comprehensive surveillance program. It is not intended to diagnose infection nor to guide or monitor treatment. Performed at [facility], [place] [address] [place], [place] [address]   SARS CORONAVIRUS 2 (TAT 6-24 HRS) Nasopharyngeal Nasopharyngeal Swab     Status: None   Collection Time: 11/29/20 10:31 AM   Specimen: Nasopharyngeal Swab  Result Value Ref Range Status   SARS Coronavirus 2 NEGATIVE NEGATIVE Final    Comment: (NOTE) SARS-CoV-2 target nucleic acids are NOT DETECTED.  The SARS-CoV-2 RNA is generally detectable in upper and lower respiratory specimens during the acute phase of infection. Negative results do not preclude SARS-CoV-2 infection, do not rule out co-infections with other pathogens, and should not be used as the sole basis for treatment or other patient management decisions. Negative results must be combined with clinical observations, patient history, and epidemiological information. The expected result is Negative.  Fact Sheet for Patients: SugarRoll.be  Fact Sheet for Healthcare Providers: https://www.woods-mathews.com/  This test is not yet  approved or cleared by the [place] FDA and  has been authorized for detection and/or diagnosis of SARS-CoV-2 by FDA under an Emergency Use Authorization (EUA). This EUA will remain  in effect (meaning this test can be used) for the duration of the COVID-19 declaration under Se ction 564(b)(1) of the Act, 21 U.S.C. section 360bbb-3(b)(1), unless the authorization is terminated or revoked sooner.  Performed at [facility], [address] [place], [place] [address]      Labs: BNP (last 3 results) No results for input(s): BNP in the last 8760 hours. Basic Metabolic Panel: Recent Labs  Lab 11/25/20 1041 11/27/20 0520 11/28/20 0456 11/29/20 0538  NA 140 135 136 137  K 3.1* 3.8 4.2 4.0  CL 102 101 100 101  CO2 27 25 29 28   GLUCOSE 152* 128* 137* 108*  BUN 21 13 23 16   CREATININE 0.59 0.65 0.68 0.55  CALCIUM 9.5 8.7* 8.6* 8.4*   Liver Function Tests: Recent Labs  Lab 11/25/20 1041  AST 24  ALT 19  ALKPHOS 62  BILITOT 0.5  PROT 7.6  ALBUMIN 4.5   No results for input(s): LIPASE, AMYLASE in the last 168 hours. No results for input(s): AMMONIA in the last 168 hours. CBC: Recent Labs  Lab 11/25/20 1041 11/27/20 0520 11/28/20 0456 11/29/20 0538  WBC 10.3 7.7 6.7 6.6  NEUTROABS 8.0*  --   --   --   HGB 12.3 10.5* 9.0* 8.9*  HCT 37.7 32.8* 28.2* 27.8*  MCV 94.0 96.2 93.7 95.2  PLT 255 202 176 190   Cardiac Enzymes: No results for input(s): CKTOTAL, CKMB, CKMBINDEX, TROPONINI in the last 168 hours. BNP: Invalid input(s): POCBNP CBG: Recent Labs  Lab 11/29/20 0715 11/29/20 1135 11/29/20 1642 11/29/20 2201 11/30/20 0735  GLUCAP 100* 127* 156* 159* 129*   D-Dimer No results for input(s): DDIMER in the last 72 hours. Hgb A1c No results for input(s): HGBA1C in the last 72 hours. Lipid Profile No results for input(s): CHOL, HDL, LDLCALC, TRIG, CHOLHDL, LDLDIRECT in the last 72 hours. Thyroid function studies No results for input(s): TSH,  T4TOTAL, T3FREE, THYROIDAB in the last 72 hours.  Invalid input(s): FREET3 Anemia work up No results for input(s): VITAMINB12, FOLATE, FERRITIN, TIBC, IRON, RETICCTPCT in the last 72 hours. Urinalysis    Component Value Date/Time   COLORURINE YELLOW 06/25/2016 1116   APPEARANCEUR CLEAR 06/25/2016 1116   LABSPEC 1.024 06/25/2016 1116   PHURINE 6.0 06/25/2016 1116   GLUCOSEU NEGATIVE 06/25/2016 1116   HGBUR NEGATIVE 06/25/2016 1116   [place] 06/25/2016 1116   [place] 06/25/2016 1116   PROTEINUR NEGATIVE 06/25/2016 1116   UROBILINOGEN 1.0 07/15/2015 1033   NITRITE NEGATIVE 06/25/2016 1116   LEUKOCYTESUR NEGATIVE 06/25/2016 1116   Sepsis Labs Invalid input(s): PROCALCITONIN,  WBC,  LACTICIDVEN Microbiology Recent Results (from the past 240 hour(s))  Resp Panel by RT-PCR (Flu A&B, Covid) Nasopharyngeal Swab     Status: None   Collection Time: 11/25/20 10:41 AM   Specimen: Nasopharyngeal Swab; Nasopharyngeal(NP) swabs in vial transport medium  Result Value Ref Range Status   SARS Coronavirus 2 by RT PCR NEGATIVE NEGATIVE Final    Comment: (NOTE) SARS-CoV-2 target nucleic acids are NOT DETECTED.  The SARS-CoV-2 RNA is generally detectable in upper respiratory specimens during the acute phase of infection. The lowest concentration of SARS-CoV-2 viral copies this assay can detect is 138 copies/mL. A negative result does not preclude SARS-Cov-2 infection and should not be used as the sole basis for treatment or other patient management decisions. A negative result may occur with  improper specimen collection/handling, submission of specimen other than nasopharyngeal swab, presence of viral mutation(s) within the areas targeted by this assay, and inadequate number of viral copies(<138 copies/mL). A negative result must be combined with clinical observations, patient history, and epidemiological information. The expected result is Negative.  Fact Sheet for  Patients:  EntrepreneurPulse.com.au  Fact  Sheet for Healthcare Providers:  IncredibleEmployment.be  This test is no t yet approved or cleared by the [place] FDA and  has been authorized for detection and/or diagnosis of SARS-CoV-2 by FDA under an Emergency Use Authorization (EUA). This EUA will remain  in effect (meaning this test can be used) for the duration of the COVID-19 declaration under Section 564(b)(1) of the Act, 21 U.S.C.section 360bbb-3(b)(1), unless the authorization is terminated  or revoked sooner.       Influenza A by PCR NEGATIVE NEGATIVE Final   Influenza B by PCR NEGATIVE NEGATIVE Final    Comment: (NOTE) The Xpert Xpress SARS-CoV-2/FLU/RSV plus assay is intended as an aid in the diagnosis of influenza from Nasopharyngeal swab specimens and should not be used as a sole basis for treatment. Nasal washings and aspirates are unacceptable for Xpert Xpress SARS-CoV-2/FLU/RSV testing.  Fact Sheet for Patients: EntrepreneurPulse.com.au  Fact Sheet for Healthcare Providers: IncredibleEmployment.be  This test is not yet approved or cleared by the [place] FDA and has been authorized for detection and/or diagnosis of SARS-CoV-2 by FDA under an Emergency Use Authorization (EUA). This EUA will remain in effect (meaning this test can be used) for the duration of the COVID-19 declaration under Section 564(b)(1) of the Act, 21 U.S.C. section 360bbb-3(b)(1), unless the authorization is terminated or revoked.  Performed at [facility], [place] [address], [place], [place] [address]   Surgical PCR screen     Status: None   Collection Time: 11/25/20 10:20 PM   Specimen: Nasal Mucosa; Nasal Swab  Result Value Ref Range Status   MRSA, PCR NEGATIVE NEGATIVE Final   Staphylococcus aureus NEGATIVE NEGATIVE Final    Comment: (NOTE) The Xpert SA Assay (FDA approved for NASAL  specimens in patients 26 years of age and older), is one component of a comprehensive surveillance program. It is not intended to diagnose infection nor to guide or monitor treatment. Performed at [facility], [place] [address], [place], [place] [address]   SARS CORONAVIRUS 2 (TAT 6-24 HRS) Nasopharyngeal Nasopharyngeal Swab     Status: None   Collection Time: 11/29/20 10:31 AM   Specimen: Nasopharyngeal Swab  Result Value Ref Range Status   SARS Coronavirus 2 NEGATIVE NEGATIVE Final    Comment: (NOTE) SARS-CoV-2 target nucleic acids are NOT DETECTED.  The SARS-CoV-2 RNA is generally detectable in upper and lower respiratory specimens during the acute phase of infection. Negative results do not preclude SARS-CoV-2 infection, do not rule out co-infections with other pathogens, and should not be used as the sole basis for treatment or other patient management decisions. Negative results must be combined with clinical observations, patient history, and epidemiological information. The expected result is Negative.  Fact Sheet for Patients: SugarRoll.be  Fact Sheet for Healthcare Providers: https://www.woods-mathews.com/  This test is not yet approved or cleared by the [place] FDA and  has been authorized for detection and/or diagnosis of SARS-CoV-2 by FDA under an Emergency Use Authorization (EUA). This EUA will remain  in effect (meaning this test can be used) for the duration of the COVID-19 declaration under Se ction 564(b)(1) of the Act, 21 U.S.C. section 360bbb-3(b)(1), unless the authorization is terminated or revoked sooner.  Performed at [facility], [place] [address] [place], [place] [address]      Time coordinating discharge: Over 30 minutes  SIGNED:   Davielle Lingelbach J [place] DO  [facility] 11/30/2020, 9:29 AM

## 2020-11-29 NOTE — TOC Progression Note (Signed)
Transition of Care [facility]) - Progression Note    Patient Details  Name: Andrea Farmer MRN: [id number] Date of Birth: 1941/06/01  Transition of Care [facility]) CM/SW [place], LCSW Phone Number: 11/29/2020, 11:20 AM  Clinical Narrative:    On call [place] approved for 6 days [address], EMS PTAR approval [phone]   Expected Discharge Plan: [place] Barriers to Discharge: Continued Medical Work up  Expected Discharge Plan and Services Expected Discharge Plan: [place] arrangements for the past 2 months: Single Family Home Expected Discharge Date: 11/29/20                                     Social Determinants of Health (SDOH) Interventions    Readmission Risk Interventions No flowsheet data found.

## 2020-11-29 NOTE — TOC Progression Note (Addendum)
Transition of Care [facility]) - Progression Note    Patient Details  Name: Andrea Farmer MRN: [id number] Date of Birth: August 22, 1941  Transition of Care [facility]) CM/SW Contact  Joanne Chars, LCSW Phone Number: 11/29/2020, 11:02 AM  Clinical Narrative:   CSW met with pt to discuss bed offers.  Choice document provided and pt accepts offer from [facility].  MD and RN notified, covid test ordered.  Insurance auth in place from [facility]: 6 days [place], EMS transport [phone]..  CSW spoke with April at [facility] and she can receive pt today.  Pt aware that she can transfer today.  CSW spoke with Tammy at [facility] and provided pt choice of [place].   1315: RN spoke to lab, will be 24 hours to get covid test back.  MD notified, [facility] notified.  Planning for discharge tomorrow.    Expected Discharge Plan: [place] Barriers to Discharge: Continued Medical Work up  Expected Discharge Plan and Services Expected Discharge Plan: [place] arrangements for the past 2 months: Single Family Home                                       Social Determinants of Health (SDOH) Interventions    Readmission Risk Interventions No flowsheet data found.

## 2020-11-29 NOTE — Progress Notes (Signed)
PROGRESS NOTE    Andrea Farmer  HBZ:[id number] DOB: 06/07/1941 DOA: 11/25/2020 PCP: Lawerance Cruel, MD    Brief Narrative:  Andrea Farmer is a 80 year old female with past medical history significant for essential hypertension, right total knee arthroplasty 2017, type 2 diabetes mellitus, hyperlipidemia, breast cancer on anastrozole, history of SVT/NSVT, mild MR, CAD per coronary CTA who presented to the ED with left hip pain following mechanical fall at home.  Upon presentation she was found to have a left hip fracture on imaging studies.  Orthopedics was consulted and hospitalist consulted for further evaluation and management of acute left hip fracture.   Assessment & Plan:   Principal Problem:   Closed left hip fracture, initial encounter [facility]) Active Problems:   Pure hypercholesterolemia   Essential (primary) hypertension   Malignant neoplasm of lower-outer quadrant of right breast of female, estrogen receptor positive ([place])   Closed left hip fracture, initial encounter Patient presenting to ED following mechanical fall at home.  Patient was found to have acute left hip fracture on imaging.  Orthopedics was consulted and patient underwent intramedullary intertrochanteric nail on 11/26/2020 by Dr. Alvan Dame.   --aspirin 325 mg p.o. twice daily x4 weeks for DVT prophylaxis.   --Weightbearing as tolerates.   --Norco, Tylenol, Robaxin for pain control/muscle spasms as needed.   --Outpatient follow-up with Dr. Alvan Dame 2 weeks.  CAD Hypercholesterolemia Continue home Crestor, Zetia, aspirin.  Essential hypertension Hx SVT/NSVT Blood pressures were monitored during hospitalization which were borderline low. Discontinued home amlodipine, benazepril and HCTZ.   --metoprolol succinate 25 mg p.o. daily.    Postoperative blood loss anemia Hemoglobin trended down to 8.9 during hospitalization, stable.   --Continue ferrous sulfate 325 mg p.o. 3 times daily x3-4 weeks per orthopedics.     Type 2 diabetes mellitus --Metformin 500 mg p.o. daily --SSI for coverage --CBGs qAC/HS  Right upper lobe opacity CT chest notable for probable scarring.  Outpatient follow-up.  Breast cancer Continue home anastrozole  Hypokalemia: Resolved Repleted during hospitalization.    Morbid obesity Body mass index is 30.65 kg/m.  Discussed with patient needs for aggressive lifestyle changes/weight loss as this complicates all facets of care.  Outpatient follow-up with PCP.  May benefit from bariatric evaluation outpatient.   DVT prophylaxis:    Code Status: Full Code Family Communication: Updated patient extensively at bedside  Disposition Plan:  Level of care: Telemetry Status is: Inpatient  Remains inpatient appropriate because:Unsafe d/c plan   Dispo: The patient is from: Home              Anticipated d/c is to: SNF              Anticipated d/c date is: 1 day              Patient currently is medically stable to d/c.   Difficult to place patient No   Consultants:   Orthopedics, Dr. Alvan Dame  Procedures:   ORIF left hip, Dr. Alvan Dame 11/26/2020  Antimicrobials:   None   Subjective: Seen and examined at bedside, resting comfortably.  Waiting for Covid-19 PCR screen for discharge to SNF.  Slightly tearful this morning regarding her independence, as she states usually helps others and now needs help.  Accepted at [facility] SNF with insurance authorization back today.  Denies headache, no fever/chills/night sweats, no nausea/vomiting, no abdominal pain, no chest pain, no palpitations, no shortness of breath.  No acute events overnight per nursing staff.  Objective: Vitals:   11/28/20  1700 11/28/20 2024 11/29/20 0436 11/29/20 1330  BP: 110/72 103/64 (!) 116/57 (!) 87/53  Pulse: 82 71 73 77  Resp:  17 17 15   Temp: 97.6 F (36.4 C) 98.3 F (36.8 C) 97.7 F (36.5 C) 97.8 F (36.6 C)  TempSrc: Oral Oral Oral Oral  SpO2: 98% 95% 94% 95%  Weight:      Height:         Intake/Output Summary (Last 24 hours) at 11/29/2020 1609 Last data filed at 11/29/2020 1400 Gross per 24 hour  Intake --  Output 303 ml  Net -303 ml   Filed Weights   11/25/20 1906 11/26/20 1613  Weight: 81 kg 81 kg    Examination:  General exam: Appears calm and comfortable  Respiratory system: Clear to auscultation. Respiratory effort normal. Cardiovascular system: S1 & S2 heard, RRR. No JVD, murmurs, rubs, gallops or clicks. No pedal edema. Gastrointestinal system: Abdomen is nondistended, soft and nontender. No organomegaly or masses felt. Normal bowel sounds heard. Central nervous system: Alert and oriented. No focal neurological deficits. Extremities: Symmetric 5 x 5 power.  Left hip surgical incision noted with ecchymosis, dressing clean/dry/intact. Skin: No rashes, lesions or ulcers Psychiatry: Judgement and insight appear normal. Mood & affect appropriate.     Data Reviewed: I have personally reviewed following labs and imaging studies  CBC: Recent Labs  Lab 11/25/20 1041 11/27/20 0520 11/28/20 0456 11/29/20 0538  WBC 10.3 7.7 6.7 6.6  NEUTROABS 8.0*  --   --   --   HGB 12.3 10.5* 9.0* 8.9*  HCT 37.7 32.8* 28.2* 27.8*  MCV 94.0 96.2 93.7 95.2  PLT 255 202 176 270   Basic Metabolic Panel: Recent Labs  Lab 11/25/20 1041 11/27/20 0520 11/28/20 0456 11/29/20 0538  NA 140 135 136 137  K 3.1* 3.8 4.2 4.0  CL 102 101 100 101  CO2 27 25 29 28   GLUCOSE 152* 128* 137* 108*  BUN 21 13 23 16   CREATININE 0.59 0.65 0.68 0.55  CALCIUM 9.5 8.7* 8.6* 8.4*   GFR: Estimated Creatinine Clearance: 59.7 mL/min (by C-G formula based on SCr of 0.55 mg/dL). Liver Function Tests: Recent Labs  Lab 11/25/20 1041  AST 24  ALT 19  ALKPHOS 62  BILITOT 0.5  PROT 7.6  ALBUMIN 4.5   No results for input(s): LIPASE, AMYLASE in the last 168 hours. No results for input(s): AMMONIA in the last 168 hours. Coagulation Profile: No results for input(s): INR, PROTIME in the  last 168 hours. Cardiac Enzymes: No results for input(s): CKTOTAL, CKMB, CKMBINDEX, TROPONINI in the last 168 hours. BNP (last 3 results) No results for input(s): PROBNP in the last 8760 hours. HbA1C: No results for input(s): HGBA1C in the last 72 hours. CBG: Recent Labs  Lab 11/28/20 1205 11/28/20 1722 11/28/20 2112 11/29/20 0715 11/29/20 1135  GLUCAP 273* 152* 108* 100* 127*   Lipid Profile: No results for input(s): CHOL, HDL, LDLCALC, TRIG, CHOLHDL, LDLDIRECT in the last 72 hours. Thyroid Function Tests: No results for input(s): TSH, T4TOTAL, FREET4, T3FREE, THYROIDAB in the last 72 hours. Anemia Panel: No results for input(s): VITAMINB12, FOLATE, FERRITIN, TIBC, IRON, RETICCTPCT in the last 72 hours. Sepsis Labs: No results for input(s): PROCALCITON, LATICACIDVEN in the last 168 hours.  Recent Results (from the past 240 hour(s))  Resp Panel by RT-PCR (Flu A&B, Covid) Nasopharyngeal Swab     Status: None   Collection Time: 11/25/20 10:41 AM   Specimen: Nasopharyngeal Swab; Nasopharyngeal(NP) swabs in vial transport  medium  Result Value Ref Range Status   SARS Coronavirus 2 by RT PCR NEGATIVE NEGATIVE Final    Comment: (NOTE) SARS-CoV-2 target nucleic acids are NOT DETECTED.  The SARS-CoV-2 RNA is generally detectable in upper respiratory specimens during the acute phase of infection. The lowest concentration of SARS-CoV-2 viral copies this assay can detect is 138 copies/mL. A negative result does not preclude SARS-Cov-2 infection and should not be used as the sole basis for treatment or other patient management decisions. A negative result may occur with  improper specimen collection/handling, submission of specimen other than nasopharyngeal swab, presence of viral mutation(s) within the areas targeted by this assay, and inadequate number of viral copies(<138 copies/mL). A negative result must be combined with clinical observations, patient history, and  epidemiological information. The expected result is Negative.  Fact Sheet for Patients:  EntrepreneurPulse.com.au  Fact Sheet for Healthcare Providers:  IncredibleEmployment.be  This test is no t yet approved or cleared by the [place] FDA and  has been authorized for detection and/or diagnosis of SARS-CoV-2 by FDA under an Emergency Use Authorization (EUA). This EUA will remain  in effect (meaning this test can be used) for the duration of the COVID-19 declaration under Section 564(b)(1) of the Act, 21 U.S.C.section 360bbb-3(b)(1), unless the authorization is terminated  or revoked sooner.       Influenza A by PCR NEGATIVE NEGATIVE Final   Influenza B by PCR NEGATIVE NEGATIVE Final    Comment: (NOTE) The Xpert Xpress SARS-CoV-2/FLU/RSV plus assay is intended as an aid in the diagnosis of influenza from Nasopharyngeal swab specimens and should not be used as a sole basis for treatment. Nasal washings and aspirates are unacceptable for Xpert Xpress SARS-CoV-2/FLU/RSV testing.  Fact Sheet for Patients: EntrepreneurPulse.com.au  Fact Sheet for Healthcare Providers: IncredibleEmployment.be  This test is not yet approved or cleared by the [place] FDA and has been authorized for detection and/or diagnosis of SARS-CoV-2 by FDA under an Emergency Use Authorization (EUA). This EUA will remain in effect (meaning this test can be used) for the duration of the COVID-19 declaration under Section 564(b)(1) of the Act, 21 U.S.C. section 360bbb-3(b)(1), unless the authorization is terminated or revoked.  Performed at [facility], [place] [address] [place], [place] [address]   Surgical PCR screen     Status: None   Collection Time: 11/25/20 10:20 PM   Specimen: Nasal Mucosa; Nasal Swab  Result Value Ref Range Status   MRSA, PCR NEGATIVE NEGATIVE Final   Staphylococcus aureus NEGATIVE  NEGATIVE Final    Comment: (NOTE) The Xpert SA Assay (FDA approved for NASAL specimens in patients 47 years of age and older), is one component of a comprehensive surveillance program. It is not intended to diagnose infection nor to guide or monitor treatment. Performed at [facility], [place] [address], [place], [place] [address]          Radiology Studies: No results found.      Scheduled Meds: . anastrozole  1 mg Oral Daily  . aspirin EC  325 mg Oral BID  . calcium-vitamin D  1 tablet Oral Daily  . Chlorhexidine Gluconate Cloth  6 each Topical Daily  . docusate sodium  100 mg Oral BID  . ezetimibe  10 mg Oral Daily  . feeding supplement  237 mL Oral BID BM  . ferrous sulfate  325 mg Oral TID PC  . insulin aspart  0-9 Units Subcutaneous TID WC  . metFORMIN  500 mg Oral Q breakfast  . metoprolol succinate  25 mg Oral Daily  . multivitamin with minerals  1 tablet Oral Daily  . mupirocin ointment  1 application Nasal BID  . omega-3 acid ethyl esters  1 capsule Oral Once per day on Mon Wed Fri  . polyethylene glycol  17 g Oral Daily  . psyllium  1 packet Oral Daily  . rosuvastatin  40 mg Oral Daily  . vitamin E  400 Units Oral QODAY   Continuous Infusions: . methocarbamol (ROBAXIN) IV       LOS: 4 days    Time spent: 38 minutes spent on chart review, discussion with nursing staff, consultants, updating family and interview/physical exam; more than 50% of that time was spent in counseling and/or coordination of care.    Jovin Fester J [place] DO [facility] Available via Epic secure chat 7am-7pm After these hours, please refer to coverage provider listed on [email or web address] 11/29/2020, 4:09 PM

## 2020-11-30 DIAGNOSIS — H04123 Dry eye syndrome of bilateral lacrimal glands: Secondary | ICD-10-CM | POA: Diagnosis not present

## 2020-11-30 DIAGNOSIS — S72002A Fracture of unspecified part of neck of left femur, initial encounter for closed fracture: Secondary | ICD-10-CM | POA: Diagnosis not present

## 2020-11-30 DIAGNOSIS — M25552 Pain in left hip: Secondary | ICD-10-CM | POA: Diagnosis not present

## 2020-11-30 DIAGNOSIS — E785 Hyperlipidemia, unspecified: Secondary | ICD-10-CM | POA: Diagnosis not present

## 2020-11-30 DIAGNOSIS — W19XXXD Unspecified fall, subsequent encounter: Secondary | ICD-10-CM | POA: Diagnosis not present

## 2020-11-30 DIAGNOSIS — S72002D Fracture of unspecified part of neck of left femur, subsequent encounter for closed fracture with routine healing: Secondary | ICD-10-CM | POA: Diagnosis not present

## 2020-11-30 DIAGNOSIS — C50511 Malignant neoplasm of lower-outer quadrant of right female breast: Secondary | ICD-10-CM | POA: Diagnosis not present

## 2020-11-30 DIAGNOSIS — I10 Essential (primary) hypertension: Secondary | ICD-10-CM | POA: Diagnosis not present

## 2020-11-30 DIAGNOSIS — K5900 Constipation, unspecified: Secondary | ICD-10-CM | POA: Diagnosis not present

## 2020-11-30 DIAGNOSIS — J3089 Other allergic rhinitis: Secondary | ICD-10-CM | POA: Diagnosis not present

## 2020-11-30 DIAGNOSIS — E559 Vitamin D deficiency, unspecified: Secondary | ICD-10-CM | POA: Diagnosis not present

## 2020-11-30 DIAGNOSIS — Z96659 Presence of unspecified artificial knee joint: Secondary | ICD-10-CM | POA: Diagnosis not present

## 2020-11-30 DIAGNOSIS — Z5189 Encounter for other specified aftercare: Secondary | ICD-10-CM | POA: Diagnosis not present

## 2020-11-30 DIAGNOSIS — Z4789 Encounter for other orthopedic aftercare: Secondary | ICD-10-CM | POA: Diagnosis not present

## 2020-11-30 DIAGNOSIS — I2510 Atherosclerotic heart disease of native coronary artery without angina pectoris: Secondary | ICD-10-CM | POA: Diagnosis not present

## 2020-11-30 DIAGNOSIS — E119 Type 2 diabetes mellitus without complications: Secondary | ICD-10-CM | POA: Diagnosis not present

## 2020-11-30 DIAGNOSIS — D649 Anemia, unspecified: Secondary | ICD-10-CM | POA: Diagnosis not present

## 2020-11-30 DIAGNOSIS — E7800 Pure hypercholesterolemia, unspecified: Secondary | ICD-10-CM | POA: Diagnosis not present

## 2020-11-30 DIAGNOSIS — M62838 Other muscle spasm: Secondary | ICD-10-CM | POA: Diagnosis not present

## 2020-11-30 LAB — GLUCOSE, CAPILLARY
Glucose-Capillary: 159 mg/dL — ABNORMAL HIGH (ref 70–99)
Glucose-Capillary: 129 mg/dL — ABNORMAL HIGH (ref 70–99)
Glucose-Capillary: 151 mg/dL — ABNORMAL HIGH (ref 70–99)

## 2020-11-30 MED ORDER — METOPROLOL SUCCINATE ER 25 MG PO TB24: 12.5000 mg | ORAL_TABLET | Freq: Every day | ORAL | Status: DC

## 2020-11-30 MED ORDER — LOPERAMIDE HCL 2 MG PO CAPS
4.0000 mg | ORAL_CAPSULE | Freq: Once | ORAL | Status: AC
  Administered 2020-11-30: 4 mg via ORAL
  Filled 2020-11-30: qty 2

## 2020-11-30 NOTE — Evaluation (Signed)
Occupational Therapy Evaluation Patient Details Name: Andrea Farmer MRN: [id number] DOB: 1941-03-10 Today's Date: 11/30/2020    History of Present Illness Santa Abdelrahman is a 80 year old female with past medical history of HTN, R TKA revision by Dr. Ronnie Derby in 2017, diabetes, hyperlipidemia, breast cancer, SVT/NSVT, mild mitral regurgitation, moderate CAD per coronary CTA, currently here for mechanical fall and s/p ORIF with L femur intramedullary nail on 11/26/20.   Clinical Impression   Patient lives at home with spouse, is typically I at baseline. Currently patient limited by pain in L LE impacting her activity tolerance, balance and safety awareness. Pt needing mod A to scoot hips to EOB, 3 attempts to stand from EOB and bed height elevated on 3rd attempt able to power up with mod A. Pt needs step by step cues for body mechanics and transferred to [facility] twice, first to urinate then second due to incontinent of stool in recliner (having ongoing diarrhea). With repetition pt able to perform final sit to stand from [facility] with min A for stability. Recommend continued acute OT services to maximize patient safety and independence with self care in order to facilitate to D/C venue listed below.    Follow Up Recommendations  SNF    Equipment Recommendations  None recommended by OT       Precautions / Restrictions Precautions Precautions: Fall Precaution Comments: monitor BP Restrictions LLE Weight Bearing: Weight bearing as tolerated      Mobility Bed Mobility Overal bed mobility: Needs Assistance Bed Mobility: Supine to Sit     Supine to sit: Mod assist;HOB elevated     General bed mobility comments: increased time, patient initiates scooting out to EOB, needing mod A with bed pad to scoot hips out    Transfers Overall transfer level: Needs assistance Equipment used: Rolling walker (2 wheeled) Transfers: Sit to/from [facility] Sit to Stand: Mod assist;From elevated  surface Stand pivot transfers: Min assist;Mod assist       General transfer comment: attempted 3 times to stand from EOB, on third attempt with bed height elevated and mod A pt able to power up to standing with cues for body mechanics. pt perform two transfers to [facility] please see ADL section for details. did improve with STS with increased repetition    Balance Overall balance assessment: Needs assistance Sitting-balance support: Feet supported Sitting balance-Leahy Scale: Fair     Standing balance support: Bilateral upper extremity supported Standing balance-Leahy Scale: Poor Standing balance comment: reliant on UE support, min-mod A                           ADL either performed or assessed with clinical judgement   ADL Overall ADL's : Needs assistance/impaired Eating/Feeding: Independent;Sitting   Grooming: Oral care;Wash/dry face;Set up;Sitting   Upper Body Bathing: Set up;Sitting   Lower Body Bathing: Maximal assistance;Sitting/lateral leans;Sit to/from stand   Upper Body Dressing : Set up;Sitting   Lower Body Dressing: Sitting/lateral leans;Sit to/from stand;Maximal assistance   Toilet Transfer: Minimal assistance;Moderate assistance;Stand-pivot;Cueing for safety;Cueing for sequencing;BSC;RW Toilet Transfer Details (indicate cue type and reason): first transfer to [facility] patient needing mod A to power up to standing, second transfer to [facility] pt min A for stability, needs multimodal cues for both transfers for safety with walker use and body mechanics Toileting- Clothing Manipulation and Hygiene: Total assistance;Sit to/from stand Toileting - Clothing Manipulation Details (indicate cue type and reason): patient first urinate on [facility], then incontinent of bowel  while sitting in chair needing total A in standing due to heavy reliance of UEs on walker for peri care     Functional mobility during ADLs: Minimal assistance;Moderate assistance;Cueing for safety;Cueing for  sequencing;Rolling walker General ADL Comments: patient needing increased assistance with self care due to pain, decreased activity tolerance, balance, safety awareness                  Pertinent Vitals/Pain Pain Assessment: Faces Faces Pain Scale: Hurts little more Pain Location: L hip with mobility Pain Descriptors / Indicators: Guarding;Sore Pain Intervention(s): Monitored during session     Hand Dominance Right   Extremity/Trunk Assessment Upper Extremity Assessment Upper Extremity Assessment: Generalized weakness       Cervical / Trunk Assessment Cervical / Trunk Assessment: Normal   Communication Communication Communication: No difficulties   Cognition Arousal/Alertness: Awake/alert Behavior During Therapy: WFL for tasks assessed/performed Overall Cognitive Status: Within Functional Limits for tasks assessed                                                Home Living Family/patient expects to be discharged to:: Private residence Living Arrangements: Spouse/significant other Available Help at Discharge: Family;Available PRN/intermittently Type of Home: Other(Comment) (townhouse) Home Access: Level entry     Home Layout: One level     Bathroom Shower/Tub: Occupational psychologist: Handicapped height     Home Equipment: Shower seat - built in;Cane - single point;Walker - 2 wheels;Bedside commode          Prior Functioning/Environment Level of Independence: Independent        Comments: Pt reports being independent with ADLs, community ambulation without AD, drives, travels with husband.        OT Problem List: Decreased strength;Decreased activity tolerance;Impaired balance (sitting and/or standing);Decreased safety awareness;Decreased knowledge of use of DME or AE;Pain;Obesity      OT Treatment/Interventions: Self-care/ADL training;Therapeutic exercise;Energy conservation;DME and/or AE instruction;Therapeutic  activities;Patient/family education;Balance training    OT Goals(Current goals can be found in the care plan section) Acute Rehab OT Goals Patient Stated Goal: diarrhea to stop OT Goal Formulation: With patient Time For Goal Achievement: 12/14/20 Potential to Achieve Goals: Good ADL Goals Pt Will Perform Grooming: with supervision;standing Pt Will Perform Lower Body Dressing: with min assist;with adaptive equipment;sitting/lateral leans;sit to/from stand Pt Will Transfer to Toilet: with min guard assist;ambulating;bedside commode (walker) Pt Will Perform Toileting - Clothing Manipulation and hygiene: with min assist;sit to/from stand;sitting/lateral leans  OT Frequency: Min 2X/week    AM-PAC OT "6 Clicks" Daily Activity     Outcome Measure Help from another person eating meals?: None Help from another person taking care of personal grooming?: A Little Help from another person toileting, which includes using toliet, bedpan, or urinal?: A Lot Help from another person bathing (including washing, rinsing, drying)?: A Lot Help from another person to put on and taking off regular upper body clothing?: A Little Help from another person to put on and taking off regular lower body clothing?: A Lot 6 Click Score: 16   End of Session Equipment Utilized During Treatment: Gait belt;Rolling walker Nurse Communication: Mobility status;Other (comment) (incontinent of BM)  Activity Tolerance: Patient tolerated treatment well Patient left: in chair;with call bell/phone within reach;with chair alarm set  OT Visit Diagnosis: Unsteadiness on feet (R26.81);Other abnormalities of gait and mobility (R26.89);Muscle weakness (generalized) (  M62.81);Pain Pain - Right/Left: Left Pain - part of body: Hip                Time: 0718-0802 OT Time Calculation (min): 44 min Charges:  OT General Charges $OT Visit: 1 Visit OT Evaluation $OT Eval Moderate Complexity: 1 Mod OT Treatments $Self Care/Home Management  : 23-37 mins  Delbert Phenix OT OT pager: [phone]  Rosemary Holms 11/30/2020, 10:44 AM

## 2020-11-30 NOTE — Progress Notes (Signed)
Report called to Tammy at [facility] SNF. Patient transferred via [place].

## 2020-11-30 NOTE — TOC Transition Note (Signed)
Transition of Care [facility]) - CM/SW Discharge Note   Patient Details  Name: Andrea Farmer MRN: [id number] Date of Birth: May 28, 1941  Transition of Care [facility]) CM/SW Contact:  Ova Freshwater Phone Number: [phone] 11/30/2020, 10:12 AM   Clinical Narrative:    Patient will d/c to [facility] Room # 204, report [phone].  Attending and Unit RN have been notified. PTAR has been requested and paperwork has been sent to Unit RN. CSW spoke with patient and updated her on d/c and transport to [facility].    Final next level of care: Skilled Nursing Facility Barriers to Discharge: Continued Medical Work up   Patient Goals and CMS Choice Patient states their goals for this hospitalization and ongoing recovery are:: To go home      Discharge Placement                       Discharge Plan and Services                                     Social Determinants of Health (SDOH) Interventions     Readmission Risk Interventions No flowsheet data found.

## 2020-11-30 NOTE — Discharge Instructions (Signed)
Distal Femur Fracture Treated With Immobilization  A distal femur fracture is a break in the lower part of the thigh bone (femur) near the knee joint. If the fracture is stable and the bone is still in the normal position (nondisplaced), the injury may be treated with immobilization. This involves the use of a cast or splint to hold the leg in place. Immobilization ensures that the bone continues to stay in the correct position while the leg is healing. What are the causes? This condition may be caused by:  A fall. This injury can result from the impact of the fall or other violent contact.  A motor vehicle accident.  A sports injury.  Other collisions with a hard surface. What increases the risk? You are more likely to develop this condition if:  You are female.  You are 6-94 years old.  You participate in high-energy sports such as soccer, ice hockey, football, and baseball.  You have a condition that weakens the bones, such as osteoporosis.  You have had a knee replacement. What are the signs or symptoms? Symptoms of this condition include:  Pain.  Swelling.  Bruising.  Inability to bend your knee.  Misshapen knee.  Inability to walk.  Inability to use your injured leg to support your body weight. How is this diagnosed? This condition may be diagnosed based on:  Your symptoms.  A physical exam.  Other tests, such as: ? Imaging studies, such as an X-ray, CT scan, MRI scan, or ultrasound. ? A procedure to view the inside of your knee with a small camera (arthroscopy). How is this treated? This condition may be treated with:  A splint. You will wear the splint until the swelling goes down.  A cast. This is used to keep the fractured bone from moving while it heals. A cast is usually put on after swelling has gone down.  Physical therapy. Follow these instructions at home: Medicines  Take over-the-counter and prescription medicines only as told by your  health care provider.  Do not drive or operate heavy machinery while taking pain medicine.  If you are taking prescription pain medicine, take actions to prevent or treat constipation. Your health care provider may recommend that you: ? Drink enough fluid to keep your urine pale yellow. ? Eat foods that are high in fiber, such as fresh fruits and vegetables, whole grains, and beans. ? Limit foods that are high in fat and processed sugars, such as fried or sweet foods. ? Take an over-the-counter or prescription medicine for constipation. If you have a splint:  Wear the splint as told by your health care provider. Remove it only as told by your health care provider.  Loosen the splint if your toes tingle, become numb, or turn cold and blue.  Keep the splint clean.  If the splint is not waterproof: ? Do not let it get wet. ? Cover it with a watertight covering when you take a bath or a shower. If you have a cast:  Do not stick anything inside the cast to scratch your skin. Doing that increases your risk of infection.  Check the skin around the cast every day. Tell your health care provider about any concerns.  You may put lotion on dry skin around the edges of the cast. Do not put lotion on the skin underneath the cast.  Keep the cast clean.  If the cast is not waterproof: ? Do not let it get wet. ? Cover it with  a watertight covering when you take a bath or a shower. Activity  Do not use your leg to support your body weight until your health care provider says that you can. Follow weight-bearing restrictions.  Use crutches, a cane, or a walker as directed.  Return to your normal activities as directed by your health care provider. Ask your health care provider what activities are safe for you.  Do not drive until your health care provider approves. Managing pain, stiffness, and swelling  If directed, put ice on the injured area: ? If you have a removable splint, remove it  as told by your health care provider. ? Put ice in a plastic bag. ? Place a towel between your skin and the bag. ? Leave the ice on for 20 minutes, 2-3 times a day.  Move your toes often to avoid stiffness and to lessen swelling.  Raise the injured area above the level of your heart while you are lying down.   General instructions  Do not put pressure on any part of the cast or splint until it is fully hardened. This may take several hours.  Do not use any products that contain nicotine or tobacco, such as cigarettes and e-cigarettes. These can delay bone healing. If you need help quitting, ask your health care provider.  Keep all follow-up visits as told by your health care provider. This is important.   Contact a health care provider if:  You have knee pain and swelling.  You have trouble walking.  Your cast becomes wet or damaged or suddenly feels too tight. Get help right away if:  Your pain and swelling get worse.  You have severe pain below the fracture.  Your skin or toenails turn blue or gray, feel cold, or become numb.  You have fluid, blood, or pus coming from under your cast.  You develop a fever.  You have pain, swelling, or redness in your leg. Summary  A distal femur fracture is a break in the lower part of the thigh bone (femur).  Falls are the most common causes of femur fractures, but sports-related injuries and motor vehicle accidents also can cause this.  If the fracture is stable and the bone is still in the normal position (nondisplaced), the injury may be treated with immobilization. Immobilization ensures that the bone continues to stay in the correct position while the leg is healing. This information is not intended to replace advice given to you by your health care provider. Make sure you discuss any questions you have with your health care provider. Document Revised: 09/26/2019 Document Reviewed: 11/16/2017 Elsevier Patient Education  2021  [place].   Femoral Shaft Fracture Treated With ORIF, Care After This sheet gives you information about how to care for yourself after your procedure. Your health care provider may also give you more specific instructions. If you have problems or questions, contact your health care provider. What can I expect after the procedure? After the procedure, it is common to have:  Pain.  Swelling.  Some redness or bruising around the incision.  A small amount of fluid or blood coming from your incision.  Stiffness in your leg. Follow these instructions at home: Medicines  Take over-the-counter and prescription medicines only as told by your health care provider. This includes medicines to prevent blood clots from forming.  Ask your health care provider if the medicine prescribed to you: ? Requires you to avoid driving or using heavy machinery. ? Can cause constipation.  You may need to take these actions to prevent or treat constipation:  Drink enough fluid to keep your urine pale yellow.  Take over-the-counter or prescription medicines.  Eat foods that are high in fiber, such as beans, whole grains, and fresh fruits and vegetables.  Limit foods that are high in fat and processed sugars, such as fried or sweet foods. Bathing  Do not take baths, swim, or use a hot tub until your health care provider approves. Ask your health care provider if you may take showers. You may only be allowed to take sponge baths.  Keep your bandage (dressing) dry. Cover it with a waterproof covering if you take a sponge bath or shower. Incision care  Follow instructions from your health care provider about how to take care of your incision. Make sure you: ? Wash your hands with soap and water before and after you change your bandage. If soap and water are not available, use hand sanitizer. ? Change your dressing as told by your health care provider. ? Leave stitches (sutures), staples, or adhesive  strips in place. These skin closures may need to stay in place for 2 weeks or longer. If adhesive strip edges start to loosen and curl up, you may trim the loose edges. Do not remove adhesive strips completely unless your health care provider tells you to do that.  Check your incision area every day for signs of infection. Check for: ? More redness, swelling, or pain. ? More fluid or blood. ? Warmth. ? Pus or a bad smell.   Managing pain, stiffness, and swelling  If directed, put ice on your leg. ? Put ice in a plastic bag. ? Place a towel between your skin or dressing and the bag. ? Leave the ice on for 20 minutes, 2-3 times a day.  Move your toes often to reduce stiffness and swelling.  Raise (elevate) your leg when sitting or lying down.   Activity  Do not use the injured limb to support your body weight until your health care provider says that you can. Use crutches or a walker as told by your health care provider.  Do exercises as told by your health care provider.  Ask your health care provider when it is safe to drive.  Return to your normal activities as told by your health care provider. Ask your health care provider what activities are safe for you. General instructions  Do not use any products that contain nicotine or tobacco, such as cigarettes, e-cigarettes, and chewing tobacco. These can delay bone healing. If you need help quitting, ask your health care provider.  Keep all follow-up visits as told by your health care provider. This is important. This includes visits for physical therapy. Physical therapy is an important part of recovery as complete healing may take 3 to 6 months. Contact a health care provider if:  You have chills or fever.  You have more redness, swelling, or pain around your incision.  You have more fluid or blood coming from your incision.  Your incision feels warm to touch.  You have pus or a bad smell coming from your incision.  You have  more bruising around your incision. Get help right away if:  You have warmth, redness, tenderness, and swelling develop in your calf or thigh.  You have chest pain or trouble breathing.  Your incision breaks open.  You have severe pain that does not get better with medicine. Summary  After this procedure, it is  common to have pain, swelling, and stiffness.  Take over-the-counter and prescription medicines only as told by your health care provider. This includes medicines to prevent blood clots from forming.  Follow instructions from your health care provider about how to take care of your incision. Check your incision area every day for signs of infection.  Keep all follow-up visits as told by your health care provider. This is important. This includes visits for physical therapy. This information is not intended to replace advice given to you by your health care provider. Make sure you discuss any questions you have with your health care provider. Document Revised: 12/14/2018 Document Reviewed: 12/14/2018 Elsevier Patient Education  2021 [place].   Hip Fracture  A hip fracture is a break in the upper part of the thigh bone (femur). This is usually the result of an injury, commonly a fall. What are the causes? This condition may be caused by:  A direct hit or injury (trauma) to the side of the hip, such as from a fall or a car accident. What increases the risk? You are more likely to develop this condition if:  You have poor balance or an unsteady walking pattern (gait). Certain conditions contribute to poor balance, including Parkinson disease and dementia.  You have thinning or weakening of your bones, such as from osteopenia or osteoporosis.  You have cancer that spreads to the leg bones.  You have certain conditions that can weaken your bones, such as thyroid disorders, intestine disorders, or a lack (deficiency) of certain nutrients.  You smoke.  You take  certain medicines, such as steroids.  You have a history of broken bones. What are the signs or symptoms? Symptoms of this condition include:  Pain over the injured hip. This is commonly felt on the side of the hip or in the front groin area.  Stiffness, bruising, and swelling over the hip.  Pain with movement of the leg, especially lifting it up. Pain often gets better with rest.  Difficulty or inability to stand, walk, or use the leg to support body weight (put weight on the leg).  The leg rolling outward when lying down.  The affected leg being shorter than the other leg. How is this diagnosed? This condition may be diagnosed based on:  Your symptoms.  A physical exam.  X-rays. These may be done: ? To confirm the diagnosis. ? To determine the type and location of the fracture. ? To check for other injuries.  MRI or CT scans. These may be done if the fracture is not visible on an X-ray. How is this treated? Treatment for this condition depends on the severity and location of your fracture. In most cases, surgery is necessary. Surgery may involve:  Repairing the fracture with a screw, nail, or rod to hold the bone in place (open reduction and internal fixation, ORIF).  Replacing the damaged parts of the femur with metal implants (hemiarthroplasty or arthroplasty). If your fracture is less severe, or if you are not eligible for surgery, you may have non-surgical treatment. Non-surgical treatment may involve:  Using crutches, a walker, or a wheelchair until your health care provider says that you can support (bear) weight on your hip.  Medicines to help reduce pain and swelling.  Having regular X-rays to monitor your fracture and make sure that it is healing.  Physical therapy. You may need physical therapy after surgery, too. Follow these instructions at home: Activity  Do not use your injured leg to  support your body weight until your health care provider says that  you can. ? Follow standing and walking restrictions as told by your health care provider. ? Use crutches, a walker, or a wheelchair as directed.  Avoid any activities that cause pain or irritation in your hip. Ask your health care provider what activities are safe for you.  Do not drive or use heavy machinery until your health care provider approves.  If physical therapy was prescribed, do exercises as told by your health care provider. General instructions  Take over-the-counter and prescription medicines only as told by your health care provider.  If directed, put ice on the injured area: ? Put ice in a plastic bag. ? Place a towel between your skin and the bag. ? Leave the ice on for 20 minutes, 2-3 times a day.  Do not use any products that contain nicotine or tobacco, such as cigarettes and e-cigarettes. These can delay bone healing. If you need help quitting, ask your health care provider.  Keep all follow-up visits as told by your health care provider. This is important.   How is this prevented?  To prevent falls at home: ? Use a cane, walker, or wheelchair as directed. ? Make sure your rooms and hallways are free of clutter, obstacles, and cords. ? Install grab bars in your bedroom and bathrooms. ? Always use handrails when going up and down stairs. ? Use nightlights around the house.  Exercise regularly. Ask what forms of exercise are safe for you, such as walking and strength and balance exercises.  Visit an eye doctor regularly to have your eyesight checked. This can help prevent falls.  Make sure you get enough calcium and vitamin D.  Do not use any products that contain nicotine or tobacco, such as cigarettes and e-cigarettes. If you need help quitting, ask your health care provider.  Limit alcohol use.  If you have an underlying condition that caused your hip fracture, work with your health care provider to manage your condition. Contact a health care provider  if:  Your pain gets worse or it does not get better with rest or medicine.  You develop any of the following in your leg or foot: ? Numbness. ? Tingling. ? A change in skin color (discoloration). ? Skin feeling cold to the touch. Get help right away if:  Your pain suddenly gets worse.  You cannot move your hip. Summary  A hip fracture is a break in the upper part of the thigh bone (femur).  Treatment typically requires surgical management to restore stability and function to the hip.  Pain medicine and icing of the affected leg can help manage pain and swelling. Follow directions as told by your health care provider. This information is not intended to replace advice given to you by your health care provider. Make sure you discuss any questions you have with your health care provider. Document Revised: 05/30/2020 Document Reviewed: 05/30/2020 Elsevier Patient Education  2021 [place].   Femoral Shaft Fracture  A femoral shaft fracture is a break in the long, straight part (shaft) of the thigh bone (femur). This condition is almost always treated with surgery. What are the causes? This condition may be caused by a forceful impact, such as from:  A fall, especially from a great height.  A sports injury.  A car or motorcycle accident. What increases the risk? This condition is more likely to develop in people who:  Are older. The risk increases with  age.  Have certain medical conditions that cause bones to become weak and thin, such as osteoporosis.  Take medicines for osteoporosis.  Play high-risk or high-impact sports. What are the signs or symptoms? Symptoms of this condition include:  Severe pain.  Inability to walk.  Bruising.  Swelling.  The thigh looking misshapen (deformity). If the fracture broke the skin (open fracture), there may also be bleeding. How is this diagnosed? This condition is diagnosed based on:  Your symptoms and medical  history.  A physical exam.  X-rays. How is this treated? This condition is usually treated with one or more of the following:  Surgery to fix the bone pieces into place with pins that are attached to a stabilizing bar outside your skin (external fixation).  Surgery to insert a rod and screws into your femur (intramedullary nailing). If you had external fixation, you may also need intramedullary nailing a few weeks later.  Surgery to place metal plates on the femur to hold it in place. This may be done if your fracture is closer to an end of your femur. In rare cases, surgery may not be an option. In that case, a cast or a splint will be placed on your leg to hold it in place while the bone heals (immobilization). Treatment may also include:  Not putting weight on your leg until it heals (weight-bearing restrictions).  Using a device to help you move around (assistive device), such as crutches or a wheelchair.  Physical therapy. Follow these instructions at home: If you have a cast:  Do not stick anything inside the cast to scratch your skin. Doing that increases your risk of infection.  Check the skin around the cast every day. Tell your health care provider about any concerns.  You may put lotion on dry skin around the edges of the cast. Do not put lotion on the skin underneath the cast.  Keep the cast clean.  If the cast is not waterproof: ? Do not let it get wet. ? Cover it with a watertight covering when you take a bath or a shower. If you have a splint:  Wear the splint as told by your health care provider. Remove it only as told by your health care provider.  Loosen the splint if your toes tingle, become numb, or turn cold and blue.  Keep the splint clean.  If you have a splint that is not waterproof: ? Do not let it get wet. ? Cover it with a watertight covering when you take a bath or a shower. Activity  Do not use your leg to support your body weight until  your health care provider says that you can. Follow weight-bearing restrictions.  Use crutches, a wheelchair, or other assistive devices as directed.  Ask your health care provider what activities are safe for you during recovery, and what activities you need to avoid.  Do physical therapy exercises as directed. Medicines  Take over-the-counter and prescription medicines only as told by your health care provider.  Ask your health care provider if you should take supplements of calcium and vitamins C and D to help your bone heal. Managing pain, stiffness, and swelling  If directed, put ice on painful areas: ? If you have a removable splint, remove it as told by your health care provider. ? Put ice in a plastic bag. ? Place a towel between your skin and the bag, or between your cast and the bag. ? Leave the ice on for  20 minutes, 2-3 times a day.  Move your toes often to avoid stiffness and to lessen swelling.  Raise (elevate) your lower leg above the level of your heart while you are lying down or sitting, whenever possible.   General instructions  Do not put pressure on any part of the cast or splint until it is fully hardened, if applicable. This may take several hours.  Do not drive until your health care provider approves. You should not drive or use heavy machinery while taking prescription pain medicine.  Do not use any products that contain nicotine or tobacco, such as cigarettes and e-cigarettes. These can delay bone healing. If you need help quitting, ask your health care provider.  Do not take baths, swim, or use a hot tub until your health care provider approves. Ask your health care provider if you may take showers. You may only be allowed to take sponge baths.  Keep all follow-up visits as told by your health care provider. This is important. Contact a health care provider if you have:  Pain that gets worse or does not get better with medicine.  Redness or swelling  that gets worse.  A fever. Get help right away if you have:  Any of the following symptoms in your toes or feet, even after loosening your splint: ? Coldness. ? Blue skin. ? Numbness. ? Tingling.  Severe pain.  Pain that gets worse when you move your toes.  Chest pain.  Shortness of breath. Summary  A femoral shaft fracture is a break in the long, straight part (shaft) of your thigh bone (femur). This is almost always treated with surgery.  This condition may be caused by a forceful impact.  Do not use your leg to support your body weight until your health care provider says that you can. Follow weight-bearing restrictions as directed.  Keep all follow-up visits as told by your health care provider. This is important. This information is not intended to replace advice given to you by your health care provider. Make sure you discuss any questions you have with your health care provider. Document Revised: 11/14/2017 Document Reviewed: 11/14/2017 Elsevier Patient Education  2021 [place].   Femoral Shaft Fracture Treated With ORIF  Open reduction with internal fixation (ORIF) is a surgery to repair a break (fracture) in the thighbone. The thighbone, also called the femur, is the longest and strongest bone in the body. The long, straight part of the femur that runs from below the hip to just above the knee is called the femoral shaft. In this procedure, a surgeon makes an incision over the fracture and moves the bones to align them properly (open reduction). The surgeon may use a combination of plates and screws to hold the bones in place (internal fixation). You may be able to use a walker or crutches to walk soon after surgery. Complete healing may take 3 to 6 months. Physical therapy is an important part of recovery. Tell a health care provider about:  Any allergies you have.  All medicines you are taking, including vitamins, herbs, eye drops, creams, and over-the-counter  medicines.  Any problems you or family members have had with anesthetic medicines.  Any blood disorders you have.  Any surgeries you have had.  Any medical conditions you have or have had.  Whether you are pregnant or may be pregnant. What are the risks? Generally, this is a safe procedure. However, problems may occur, including:  Infection.  Bleeding.  Allergic reactions  to medicines or dyes.  Damage to other structures. These include nerves and blood vessels.  Poor healing position of the fracture (malalignment).  Failure of the bone to heal properly (nonunion).  A blood clot in the leg.  A blood clot or piece of fat from inside the bone that breaks loose and travels to the lungs (pulmonary embolism).  A piece of fat that breaks loose from inside the bone and travels to the lungs (fat embolism). What happens before the procedure? Staying hydrated Follow instructions from your health care provider about hydration, which may include:  Up to 2 hours before the procedure - you may continue to drink clear liquids, such as water, clear fruit juice, black coffee, and plain tea.   Eating and drinking restrictions Follow instructions from your health care provider about eating and drinking, which may include:  8 hours before the procedure - stop eating heavy meals or foods, such as meat, fried foods, or fatty foods.  6 hours before the procedure - stop eating light meals or foods, such as toast or cereal.  6 hours before the procedure - stop drinking milk or drinks that contain milk.  2 hours before the procedure - stop drinking clear liquids. Medicines Ask your health care provider about:  Changing or stopping your regular medicines. This is especially important if you are taking diabetes medicines or blood thinners.  Taking medicines such as aspirin and ibuprofen. These medicines can thin your blood. Do not take these medicines unless your health care provider tells you  to take them.  Taking over-the-counter medicines, vitamins, herbs, and supplements. Tests You may have imaging studies. These may include:  X-rays.  CT scans. General instructions  Do not use any products that contain nicotine or tobacco for at least 4 weeks before the procedure. These products include cigarettes, e-cigarettes, and chewing tobacco. If you need help quitting, ask your health care provider.  Ask your health care provider: ? How your surgery site will be marked. ? What steps will be taken to help prevent infection. These may include:  Removing hair at the surgery site.  Washing skin with a germ-killing soap.  Receiving antibiotic medicine.  Plan to have someone take you home from the hospital or clinic. What happens during the procedure?  An IV will be inserted into one of your veins.  You will be given one or both of the following medicines: ? A sedative to help you relax. ? A general anesthetic to make you fall asleep.  Your surgeon will make an incision to reach the fracture site.  Your bones will be aligned in the correct position.  A combination of plates and screws may be used to fix your fracture.  You may also have a bone graft to strengthen the fracture site.  The incision will be closed with stitches (sutures) or staples.  A bandage (dressing) will be placed over the incision. The procedure may vary among health care providers and hospitals.   What happens after the procedure?  Your blood pressure, heart rate, breathing rate, and blood oxygen level will be monitored until you leave the hospital or clinic.  You will stay in the hospital for observation.  You may be given medicines to: ? Treat pain. ? Thin blood or to prevent blood clots (anticoagulants). ? Prevent infection.  You will meet with a physical therapist to talk about when to start walking with crutches or a walker. Summary  Open reduction with internal fixation (ORIF) is  a  surgery to repair a break (fracture) in the thighbone.  Your surgeon may use a combination of plates and screws to hold your bones in place (internal fixation).  Follow instructions from your health care provider about eating and drinking before the procedure.  Complete healing may take 3 to 6 months. Physical therapy is an important part of recovery. This information is not intended to replace advice given to you by your health care provider. Make sure you discuss any questions you have with your health care provider. Document Revised: 12/07/2018 Document Reviewed: 12/07/2018 Elsevier Patient Education  2021 [place].   Hip Fracture Treated With ORIF, Care After This sheet gives you information about how to care for yourself after your procedure. Your health care provider may also give you more specific instructions. If you have problems or questions, contact your health care provider. What can I expect after the procedure? After the procedure, it is common to have:  Pain. You will be given medicines to treat this.  Swelling.  Difficulty walking.  Some redness or bruising around the incision.  A small amount of fluid or blood from the incision. Follow these instructions at home: Medicines  Take over-the-counter and prescription medicines only as told by your health care provider.  You may be given a blood thinner to take for up to six weeks. This will help reduce the risk of developing a blood clot. It is important to use this medicine exactly as directed.  You may be given calcium and vitamin D supplements to strengthen your bones.  If you are taking prescription pain medicine, take actions to prevent or treat constipation. Your health care provider may recommend that you: ? Drink enough fluid to keep your urine pale yellow. ? Eat foods that are high in fiber, such as fresh fruits and vegetables, whole grains, and beans. ? Limit foods that are high in fat and processed  sugars, such as fried or sweet foods. ? Take an over-the-counter or prescription medicine for constipation. Bathing  Do not take baths, swim, or use a hot tub until your health care provider approves. Ask your health care provider if you can take showers. You may only be allowed to take sponge baths.  Keep the bandage (dressing) dry until your health care provider says it can be removed. Incision care  Follow instructions from your health care provider about how to take care of your incision. Make sure you: ? Wash your hands with soap and water before you change your dressing. If soap and water are not available, use hand sanitizer. ? Change your dressing as told by your health care provider. ? Leave stitches (sutures), skin glue, or adhesive strips in place. These skin closures may need to stay in place for 2 weeks or longer. If adhesive strip edges start to loosen and curl up, you may trim the loose edges. Do not remove adhesive strips completely unless your health care provider tells you to do that.  Check your incision area every day for signs of infection. Check for: ? More redness, swelling, or pain. ? More fluid or blood. ? Warmth. ? Pus or a bad smell.   Managing pain, stiffness, and swelling  If directed, put ice on the affected area to prevent pain and swelling. ? Put ice in a plastic bag. ? Place a towel between your skin and the bag. ? Leave the ice on for 20 minutes, 2-3 times a day.  Move your toes often to avoid  stiffness and to lessen swelling.  Raise (elevate) your leg above the level of your heart while you are sitting or lying down. To do this, try putting a few pillows under your leg.   Activity  Return to your normal activities as told by your health care provider. Ask your health care provider what activities are safe for you.  Do exercises as told by your health care provider or physical therapist. This will help make your hip stronger and help you recover more  quickly.  Do not use your injured limb to support (bear) your body weight until your health care provider says that you can. Follow weight-bearing restrictions as told. Use crutches or other devices to help you move around (assistive devices) as directed.  You may feel most comfortable using a raised surface when sitting on the toilet or in a chair.  Consider using a toilet seat riser over the toilet for comfort.   Driving  Do not drive or use heavy machinery while taking prescription pain medicine.  Ask your health care provider when it is safe for you to drive. General instructions  Wear compression stockings as told by your health care provider. These stockings help to prevent blood clots and reduce swelling in your legs.  Do not use any products that contain nicotine or tobacco, such as cigarettes and e-cigarettes. These can delay bone healing. If you need help quitting, ask your health care provider.  Keep all follow-up visits as told by your health care provider. This is important. This may include visits for: ? Physical therapy. ? Screening for osteoporosis. Osteoporosis is thinning and loss of density in your bones. Contact a health care provider if you:  Have a fever.  Have pain that is not helped with medicine.  Have more redness, swelling, or pain at your incision area.  Have more fluid or blood coming from your incision or leaking through your dressing.  Notice that your incision feels warm to the touch.  Have pus or a bad smell coming from your incision area. Get help right away if you:  Notice that the edges of your incision have come apart after the sutures or staples have been removed.  Have pain, warmth, or tenderness in the back of your lower leg (calf).  Have tingling or numbness in your leg.  Have a pale and cold leg.  Have trouble breathing.  Have chest pain. Summary  After the procedure, it is common to have some pain and swelling.  Take pain  medicines as directed by your health care provider. Icing may also help with pain control.  Contact your health care provider if you have signs of infection, severe pain, or more fluid or blood coming from your incision. This information is not intended to replace advice given to you by your health care provider. Make sure you discuss any questions you have with your health care provider. Document Revised: 06/17/2018 Document Reviewed: 11/07/2017 Elsevier Patient Education  2021 [place] items at home which could result in a fall. This includes throw rugs or furniture in walking pathways o ICE to the affected joint every three hours while awake for 30 minutes at a time, for at least the first 3-5 days, and then as needed for pain and swelling.  Continue to use ice for pain and swelling. You may notice swelling that will progress down to the foot and ankle.  This is normal after surgery.  Elevate your  leg when you are not up walking on it.   o Continue to use the breathing machine you got in the hospital (incentive spirometer) which will help keep your temperature down.  It is common for your temperature to cycle up and down following surgery, especially at night when you are not up moving around and exerting yourself.  The breathing machine keeps your lungs expanded and your temperature down.   DIET:  As you were doing prior to hospitalization, we recommend a well-balanced diet.  DRESSING / WOUND CARE / SHOWERING  Keep the surgical dressing until follow up.  The dressing is water proof, so you can shower without any extra covering.  IF THE DRESSING FALLS OFF or the wound gets wet inside, change the dressing with sterile gauze.  Please use good hand washing techniques before changing the dressing.  Do not use any lotions or creams on the incision until instructed by your surgeon.    ACTIVITY  o Increase activity slowly as tolerated, but follow the  weight bearing instructions below.   o No driving for 6 weeks or until further direction given by your physician.  You cannot drive while taking narcotics.  o No lifting or carrying greater than 10 lbs. until further directed by your surgeon. o Avoid periods of inactivity such as sitting longer than an hour when not asleep. This helps prevent blood clots.  o You may return to work once you are authorized by your doctor.     WEIGHT BEARING   Weight bearing as tolerated with assist device (walker, cane, etc) as directed, use it as long as suggested by your surgeon or therapist, typically at least 4-6 weeks.   CONSTIPATION  Constipation is defined medically as fewer than three stools per week and severe constipation as less than one stool per week.  Even if you have a regular bowel pattern at home, your normal regimen is likely to be disrupted due to multiple reasons following surgery.  Combination of anesthesia, postoperative narcotics, change in appetite and fluid intake all can affect your bowels.   YOU MUST use at least one of the following options; they are listed in order of increasing strength to get the job done.  They are all available over the counter, and you may need to use some, POSSIBLY even all of these options:    Drink plenty of fluids (prune juice may be helpful) and high fiber foods Colace 100 mg by mouth twice a day  Senokot for constipation as directed and as needed Dulcolax (bisacodyl), take with full glass of water  Miralax (polyethylene glycol) once or twice a day as needed.  If you have tried all these things and are unable to have a bowel movement in the first 3-4 days after surgery call either your surgeon or your primary doctor.    If you experience loose stools or diarrhea, hold the medications until you stool forms back up.  If your symptoms do not get better within 1 week or if they get worse, check with your doctor.  If you experience "the worst abdominal pain  ever" or develop nausea or vomiting, please contact the office immediately for further recommendations for treatment.   ITCHING:  If you experience itching with your medications, try taking only a single pain pill, or even half a pain pill at a time.  You can also use Benadryl over the counter for itching or also to help with sleep.   TED HOSE STOCKINGS:  Use  stockings on both legs until for at least 2 weeks or as directed by physician office. They may be removed at night for sleeping.  MEDICATIONS:  See your medication summary on the "After Visit Summary" that nursing will review with you.  You may have some home medications which will be placed on hold until you complete the course of blood thinner medication.  It is important for you to complete the blood thinner medication as prescribed.  PRECAUTIONS:  If you experience chest pain or shortness of breath - call 911 immediately for transfer to the hospital emergency department.   If you develop a fever greater that 101 F, purulent drainage from wound, increased redness or drainage from wound, foul odor from the wound/dressing, or calf pain - CONTACT YOUR SURGEON.                                                   FOLLOW-UP APPOINTMENTS:  If you do not already have a post-op appointment, please call the office for an appointment to be seen by your surgeon.  Guidelines for how soon to be seen are listed in your "After Visit Summary", but are typically between 1-4 weeks after surgery.  MAKE SURE YOU:  . Understand these instructions.  . Get help right away if you are not doing well or get worse.    Thank you for letting us be a part of your medical care team.  It is a privilege we respect greatly.  We hope these instructions will help you stay on track for a fast and full recovery!

## 2020-12-05 ENCOUNTER — Encounter (HOSPITAL_COMMUNITY): Payer: Self-pay | Admitting: Orthopedic Surgery

## 2020-12-09 ENCOUNTER — Other Ambulatory Visit: Payer: Self-pay | Admitting: Cardiology

## 2020-12-09 DIAGNOSIS — I10 Essential (primary) hypertension: Secondary | ICD-10-CM

## 2020-12-09 DIAGNOSIS — E7800 Pure hypercholesterolemia, unspecified: Secondary | ICD-10-CM

## 2020-12-09 DIAGNOSIS — I2510 Atherosclerotic heart disease of native coronary artery without angina pectoris: Secondary | ICD-10-CM

## 2020-12-10 DIAGNOSIS — E119 Type 2 diabetes mellitus without complications: Secondary | ICD-10-CM | POA: Diagnosis not present

## 2020-12-10 DIAGNOSIS — S7292XD Unspecified fracture of left femur, subsequent encounter for closed fracture with routine healing: Secondary | ICD-10-CM | POA: Diagnosis not present

## 2020-12-10 DIAGNOSIS — E785 Hyperlipidemia, unspecified: Secondary | ICD-10-CM | POA: Diagnosis not present

## 2020-12-10 DIAGNOSIS — I051 Rheumatic mitral insufficiency: Secondary | ICD-10-CM | POA: Diagnosis not present

## 2020-12-10 DIAGNOSIS — I471 Supraventricular tachycardia: Secondary | ICD-10-CM | POA: Diagnosis not present

## 2020-12-10 DIAGNOSIS — S72142D Displaced intertrochanteric fracture of left femur, subsequent encounter for closed fracture with routine healing: Secondary | ICD-10-CM | POA: Diagnosis not present

## 2020-12-10 DIAGNOSIS — D62 Acute posthemorrhagic anemia: Secondary | ICD-10-CM | POA: Diagnosis not present

## 2020-12-10 DIAGNOSIS — I700 Atherosclerosis of aorta: Secondary | ICD-10-CM | POA: Diagnosis not present

## 2020-12-10 DIAGNOSIS — Z9181 History of falling: Secondary | ICD-10-CM | POA: Diagnosis not present

## 2020-12-10 DIAGNOSIS — Z4789 Encounter for other orthopedic aftercare: Secondary | ICD-10-CM | POA: Diagnosis not present

## 2020-12-10 DIAGNOSIS — E7800 Pure hypercholesterolemia, unspecified: Secondary | ICD-10-CM | POA: Diagnosis not present

## 2020-12-10 DIAGNOSIS — I10 Essential (primary) hypertension: Secondary | ICD-10-CM | POA: Diagnosis not present

## 2020-12-10 DIAGNOSIS — I2510 Atherosclerotic heart disease of native coronary artery without angina pectoris: Secondary | ICD-10-CM | POA: Diagnosis not present

## 2020-12-10 DIAGNOSIS — C50511 Malignant neoplasm of lower-outer quadrant of right female breast: Secondary | ICD-10-CM | POA: Diagnosis not present

## 2020-12-10 DIAGNOSIS — D630 Anemia in neoplastic disease: Secondary | ICD-10-CM | POA: Diagnosis not present

## 2020-12-12 DIAGNOSIS — Z4789 Encounter for other orthopedic aftercare: Secondary | ICD-10-CM | POA: Diagnosis not present

## 2020-12-12 DIAGNOSIS — S72142D Displaced intertrochanteric fracture of left femur, subsequent encounter for closed fracture with routine healing: Secondary | ICD-10-CM | POA: Diagnosis not present

## 2020-12-12 DIAGNOSIS — S72142A Displaced intertrochanteric fracture of left femur, initial encounter for closed fracture: Secondary | ICD-10-CM | POA: Diagnosis not present

## 2020-12-16 DIAGNOSIS — S72142D Displaced intertrochanteric fracture of left femur, subsequent encounter for closed fracture with routine healing: Secondary | ICD-10-CM | POA: Diagnosis not present

## 2020-12-16 DIAGNOSIS — Z9181 History of falling: Secondary | ICD-10-CM | POA: Diagnosis not present

## 2020-12-16 DIAGNOSIS — C50511 Malignant neoplasm of lower-outer quadrant of right female breast: Secondary | ICD-10-CM | POA: Diagnosis not present

## 2020-12-16 DIAGNOSIS — I10 Essential (primary) hypertension: Secondary | ICD-10-CM | POA: Diagnosis not present

## 2020-12-16 DIAGNOSIS — E785 Hyperlipidemia, unspecified: Secondary | ICD-10-CM | POA: Diagnosis not present

## 2020-12-16 DIAGNOSIS — I471 Supraventricular tachycardia: Secondary | ICD-10-CM | POA: Diagnosis not present

## 2020-12-16 DIAGNOSIS — Z79811 Long term (current) use of aromatase inhibitors: Secondary | ICD-10-CM | POA: Diagnosis not present

## 2020-12-16 DIAGNOSIS — E119 Type 2 diabetes mellitus without complications: Secondary | ICD-10-CM | POA: Diagnosis not present

## 2020-12-16 DIAGNOSIS — Z96651 Presence of right artificial knee joint: Secondary | ICD-10-CM | POA: Diagnosis not present

## 2020-12-16 DIAGNOSIS — I051 Rheumatic mitral insufficiency: Secondary | ICD-10-CM | POA: Diagnosis not present

## 2020-12-16 DIAGNOSIS — I2510 Atherosclerotic heart disease of native coronary artery without angina pectoris: Secondary | ICD-10-CM | POA: Diagnosis not present

## 2020-12-16 DIAGNOSIS — Z7982 Long term (current) use of aspirin: Secondary | ICD-10-CM | POA: Diagnosis not present

## 2020-12-16 DIAGNOSIS — Z170 Estrogen receptor positive status [ER+]: Secondary | ICD-10-CM | POA: Diagnosis not present

## 2020-12-16 DIAGNOSIS — D62 Acute posthemorrhagic anemia: Secondary | ICD-10-CM | POA: Diagnosis not present

## 2020-12-16 DIAGNOSIS — I700 Atherosclerosis of aorta: Secondary | ICD-10-CM | POA: Diagnosis not present

## 2020-12-16 DIAGNOSIS — E7800 Pure hypercholesterolemia, unspecified: Secondary | ICD-10-CM | POA: Diagnosis not present

## 2020-12-16 DIAGNOSIS — D630 Anemia in neoplastic disease: Secondary | ICD-10-CM | POA: Diagnosis not present

## 2020-12-16 DIAGNOSIS — Z7984 Long term (current) use of oral hypoglycemic drugs: Secondary | ICD-10-CM | POA: Diagnosis not present

## 2020-12-16 DIAGNOSIS — E876 Hypokalemia: Secondary | ICD-10-CM | POA: Diagnosis not present

## 2020-12-23 DIAGNOSIS — S7292XD Unspecified fracture of left femur, subsequent encounter for closed fracture with routine healing: Secondary | ICD-10-CM | POA: Diagnosis not present

## 2020-12-23 DIAGNOSIS — Z09 Encounter for follow-up examination after completed treatment for conditions other than malignant neoplasm: Secondary | ICD-10-CM | POA: Diagnosis not present

## 2020-12-25 DIAGNOSIS — L03119 Cellulitis of unspecified part of limb: Secondary | ICD-10-CM | POA: Diagnosis not present

## 2020-12-29 DIAGNOSIS — E785 Hyperlipidemia, unspecified: Secondary | ICD-10-CM | POA: Diagnosis not present

## 2020-12-29 DIAGNOSIS — E876 Hypokalemia: Secondary | ICD-10-CM | POA: Diagnosis not present

## 2020-12-29 DIAGNOSIS — Z96651 Presence of right artificial knee joint: Secondary | ICD-10-CM | POA: Diagnosis not present

## 2020-12-29 DIAGNOSIS — Z170 Estrogen receptor positive status [ER+]: Secondary | ICD-10-CM | POA: Diagnosis not present

## 2020-12-29 DIAGNOSIS — I2510 Atherosclerotic heart disease of native coronary artery without angina pectoris: Secondary | ICD-10-CM | POA: Diagnosis not present

## 2020-12-29 DIAGNOSIS — D62 Acute posthemorrhagic anemia: Secondary | ICD-10-CM | POA: Diagnosis not present

## 2020-12-29 DIAGNOSIS — I10 Essential (primary) hypertension: Secondary | ICD-10-CM | POA: Diagnosis not present

## 2020-12-29 DIAGNOSIS — E7800 Pure hypercholesterolemia, unspecified: Secondary | ICD-10-CM | POA: Diagnosis not present

## 2020-12-29 DIAGNOSIS — Z7984 Long term (current) use of oral hypoglycemic drugs: Secondary | ICD-10-CM | POA: Diagnosis not present

## 2020-12-29 DIAGNOSIS — S72142D Displaced intertrochanteric fracture of left femur, subsequent encounter for closed fracture with routine healing: Secondary | ICD-10-CM | POA: Diagnosis not present

## 2020-12-29 DIAGNOSIS — C50511 Malignant neoplasm of lower-outer quadrant of right female breast: Secondary | ICD-10-CM | POA: Diagnosis not present

## 2020-12-29 DIAGNOSIS — Z79811 Long term (current) use of aromatase inhibitors: Secondary | ICD-10-CM | POA: Diagnosis not present

## 2020-12-29 DIAGNOSIS — I471 Supraventricular tachycardia: Secondary | ICD-10-CM | POA: Diagnosis not present

## 2020-12-29 DIAGNOSIS — I700 Atherosclerosis of aorta: Secondary | ICD-10-CM | POA: Diagnosis not present

## 2020-12-29 DIAGNOSIS — Z9181 History of falling: Secondary | ICD-10-CM | POA: Diagnosis not present

## 2020-12-29 DIAGNOSIS — I051 Rheumatic mitral insufficiency: Secondary | ICD-10-CM | POA: Diagnosis not present

## 2020-12-29 DIAGNOSIS — D630 Anemia in neoplastic disease: Secondary | ICD-10-CM | POA: Diagnosis not present

## 2020-12-29 DIAGNOSIS — E119 Type 2 diabetes mellitus without complications: Secondary | ICD-10-CM | POA: Diagnosis not present

## 2020-12-29 DIAGNOSIS — Z7982 Long term (current) use of aspirin: Secondary | ICD-10-CM | POA: Diagnosis not present

## 2021-01-06 DIAGNOSIS — S72142D Displaced intertrochanteric fracture of left femur, subsequent encounter for closed fracture with routine healing: Secondary | ICD-10-CM | POA: Diagnosis not present

## 2021-01-06 DIAGNOSIS — D630 Anemia in neoplastic disease: Secondary | ICD-10-CM | POA: Diagnosis not present

## 2021-01-06 DIAGNOSIS — I471 Supraventricular tachycardia: Secondary | ICD-10-CM | POA: Diagnosis not present

## 2021-01-06 DIAGNOSIS — Z7984 Long term (current) use of oral hypoglycemic drugs: Secondary | ICD-10-CM | POA: Diagnosis not present

## 2021-01-06 DIAGNOSIS — I700 Atherosclerosis of aorta: Secondary | ICD-10-CM | POA: Diagnosis not present

## 2021-01-06 DIAGNOSIS — I10 Essential (primary) hypertension: Secondary | ICD-10-CM | POA: Diagnosis not present

## 2021-01-06 DIAGNOSIS — D62 Acute posthemorrhagic anemia: Secondary | ICD-10-CM | POA: Diagnosis not present

## 2021-01-06 DIAGNOSIS — E785 Hyperlipidemia, unspecified: Secondary | ICD-10-CM | POA: Diagnosis not present

## 2021-01-06 DIAGNOSIS — I2510 Atherosclerotic heart disease of native coronary artery without angina pectoris: Secondary | ICD-10-CM | POA: Diagnosis not present

## 2021-01-06 DIAGNOSIS — E119 Type 2 diabetes mellitus without complications: Secondary | ICD-10-CM | POA: Diagnosis not present

## 2021-01-06 DIAGNOSIS — Z7982 Long term (current) use of aspirin: Secondary | ICD-10-CM | POA: Diagnosis not present

## 2021-01-06 DIAGNOSIS — Z9181 History of falling: Secondary | ICD-10-CM | POA: Diagnosis not present

## 2021-01-06 DIAGNOSIS — Z96651 Presence of right artificial knee joint: Secondary | ICD-10-CM | POA: Diagnosis not present

## 2021-01-06 DIAGNOSIS — Z170 Estrogen receptor positive status [ER+]: Secondary | ICD-10-CM | POA: Diagnosis not present

## 2021-01-06 DIAGNOSIS — I051 Rheumatic mitral insufficiency: Secondary | ICD-10-CM | POA: Diagnosis not present

## 2021-01-06 DIAGNOSIS — E7800 Pure hypercholesterolemia, unspecified: Secondary | ICD-10-CM | POA: Diagnosis not present

## 2021-01-06 DIAGNOSIS — Z79811 Long term (current) use of aromatase inhibitors: Secondary | ICD-10-CM | POA: Diagnosis not present

## 2021-01-06 DIAGNOSIS — E876 Hypokalemia: Secondary | ICD-10-CM | POA: Diagnosis not present

## 2021-01-06 DIAGNOSIS — C50511 Malignant neoplasm of lower-outer quadrant of right female breast: Secondary | ICD-10-CM | POA: Diagnosis not present

## 2021-01-09 DIAGNOSIS — Z4789 Encounter for other orthopedic aftercare: Secondary | ICD-10-CM | POA: Diagnosis not present

## 2021-01-09 DIAGNOSIS — S72142D Displaced intertrochanteric fracture of left femur, subsequent encounter for closed fracture with routine healing: Secondary | ICD-10-CM | POA: Diagnosis not present

## 2021-01-12 DIAGNOSIS — L0390 Cellulitis, unspecified: Secondary | ICD-10-CM | POA: Diagnosis not present

## 2021-01-12 DIAGNOSIS — M1990 Unspecified osteoarthritis, unspecified site: Secondary | ICD-10-CM | POA: Diagnosis not present

## 2021-01-12 DIAGNOSIS — E1169 Type 2 diabetes mellitus with other specified complication: Secondary | ICD-10-CM | POA: Diagnosis not present

## 2021-01-23 DIAGNOSIS — R269 Unspecified abnormalities of gait and mobility: Secondary | ICD-10-CM | POA: Diagnosis not present

## 2021-01-23 DIAGNOSIS — M25552 Pain in left hip: Secondary | ICD-10-CM | POA: Diagnosis not present

## 2021-01-23 DIAGNOSIS — M545 Low back pain, unspecified: Secondary | ICD-10-CM | POA: Diagnosis not present

## 2021-01-26 DIAGNOSIS — M25552 Pain in left hip: Secondary | ICD-10-CM | POA: Insufficient documentation

## 2021-01-26 DIAGNOSIS — R269 Unspecified abnormalities of gait and mobility: Secondary | ICD-10-CM | POA: Insufficient documentation

## 2021-01-26 DIAGNOSIS — M545 Low back pain, unspecified: Secondary | ICD-10-CM | POA: Insufficient documentation

## 2021-01-30 DIAGNOSIS — I739 Peripheral vascular disease, unspecified: Secondary | ICD-10-CM | POA: Diagnosis not present

## 2021-01-30 DIAGNOSIS — M25552 Pain in left hip: Secondary | ICD-10-CM | POA: Diagnosis not present

## 2021-01-30 DIAGNOSIS — R269 Unspecified abnormalities of gait and mobility: Secondary | ICD-10-CM | POA: Diagnosis not present

## 2021-01-30 DIAGNOSIS — M545 Low back pain, unspecified: Secondary | ICD-10-CM | POA: Diagnosis not present

## 2021-02-02 DIAGNOSIS — M25552 Pain in left hip: Secondary | ICD-10-CM | POA: Diagnosis not present

## 2021-02-02 DIAGNOSIS — M545 Low back pain, unspecified: Secondary | ICD-10-CM | POA: Diagnosis not present

## 2021-02-02 DIAGNOSIS — R269 Unspecified abnormalities of gait and mobility: Secondary | ICD-10-CM | POA: Diagnosis not present

## 2021-02-03 DIAGNOSIS — C50511 Malignant neoplasm of lower-outer quadrant of right female breast: Secondary | ICD-10-CM | POA: Diagnosis not present

## 2021-02-03 DIAGNOSIS — Z170 Estrogen receptor positive status [ER+]: Secondary | ICD-10-CM | POA: Diagnosis not present

## 2021-02-05 DIAGNOSIS — R269 Unspecified abnormalities of gait and mobility: Secondary | ICD-10-CM | POA: Diagnosis not present

## 2021-02-05 DIAGNOSIS — M25552 Pain in left hip: Secondary | ICD-10-CM | POA: Diagnosis not present

## 2021-02-05 DIAGNOSIS — M545 Low back pain, unspecified: Secondary | ICD-10-CM | POA: Diagnosis not present

## 2021-02-06 ENCOUNTER — Other Ambulatory Visit: Payer: Self-pay

## 2021-02-06 ENCOUNTER — Ambulatory Visit: Payer: PPO

## 2021-02-06 ENCOUNTER — Ambulatory Visit
Admission: RE | Admit: 2021-02-06 | Discharge: 2021-02-06 | Disposition: A | Discharge status: Home / Self Care | Payer: PPO | Source: Ambulatory Visit | Attending: Family Medicine | Admitting: Family Medicine

## 2021-02-06 DIAGNOSIS — Z853 Personal history of malignant neoplasm of breast: Secondary | ICD-10-CM | POA: Diagnosis not present

## 2021-02-06 DIAGNOSIS — R922 Inconclusive mammogram: Secondary | ICD-10-CM | POA: Diagnosis not present

## 2021-02-09 ENCOUNTER — Other Ambulatory Visit: Payer: Self-pay | Admitting: Family Medicine

## 2021-02-09 DIAGNOSIS — Z853 Personal history of malignant neoplasm of breast: Secondary | ICD-10-CM

## 2021-02-11 DIAGNOSIS — R269 Unspecified abnormalities of gait and mobility: Secondary | ICD-10-CM | POA: Diagnosis not present

## 2021-02-11 DIAGNOSIS — M545 Low back pain, unspecified: Secondary | ICD-10-CM | POA: Diagnosis not present

## 2021-02-11 DIAGNOSIS — M25552 Pain in left hip: Secondary | ICD-10-CM | POA: Diagnosis not present

## 2021-02-13 DIAGNOSIS — R269 Unspecified abnormalities of gait and mobility: Secondary | ICD-10-CM | POA: Diagnosis not present

## 2021-02-13 DIAGNOSIS — M545 Low back pain, unspecified: Secondary | ICD-10-CM | POA: Diagnosis not present

## 2021-02-13 DIAGNOSIS — M25552 Pain in left hip: Secondary | ICD-10-CM | POA: Diagnosis not present

## 2021-02-15 NOTE — Progress Notes (Signed)
Cardiology Office Note:    Date:  02/17/2021   ID:  Andrea Farmer, DOB 12-Sep-1941, MRN [id number]  PCP:  Andrea Cruel, MD   [place] [facility] Cardiologist:  Andrea Dawley, MD (Inactive) {   Referring MD: Andrea Cruel, MD    History of Present Illness:    Andrea Farmer is a 80 y.o. 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 Meda Farmer who now returns to clinic for follow-up.  Per review of the chart, in spring of 2021, she noted increased palpitations. Monitor in 01/2020 showed 11% PVC burden, multiple runs of atrial tachycardia, and short runs of non-sustained VT. Echo showed LVEF of 60-65% with normal wall motion, grade 1 diastolic dysfunction, and mild MR. She was last seen by Dr. Meda Farmer in 02/2020 at which time she reported fatigue but was otherwise. Given worsening number of PVCs, repeat coronary CTA showed moderate disease that was negative by CT FFR.  Was last seen in [facility] hospital for pre-op evaluation prior to ORIF of her left hip after a mechanical fall. She was deemed low risk and no further pre-operative testing needed.  Today, the patient is doing well. She has  resumed walking with a walker and has started driving again after her procedure. Has some pain in the hip but overall improved. Has been doing PT without issues. She states that since her procedure, she has had bilateral LE edema which has not improved much with her HCTZ. No chest pain, SOB, palpitations, orthopnea or PND. Has been taking all medications as prescribed. Blood pressure is well controlled.   Past Medical History:  Diagnosis Date  . Acid reflux   . Back pain    arthritis  . Cataract    immature but doesn't know which eye  . Diabetes mellitus without complication ([place])   . High cholesterol    takes Crestor daily   . History of colon polyps    benign  . History of migraine   . Hypertension    takes Lotrel and HCTZ dailydaily  . Irritable bowel syndrome 03/11/2016  . Joint pain   . Joint swelling   . Osteoarthritis of knee 03/11/2016  . Pneumonia    30 plus yrs ago    Past Surgical History:  Procedure Laterality Date  . ABDOMINAL HYSTERECTOMY    . BREAST BIOPSY Right 05/14/2015  . BREAST EXCISIONAL BIOPSY Left   . BREAST EXCISIONAL BIOPSY Right   . BREAST LUMPECTOMY WITH RADIOACTIVE SEED AND SENTINEL LYMPH NODE BIOPSY Right 03/20/2020   Procedure: RIGHT BREAST LUMPECTOMY WITH RADIOACTIVE SEED AND SENTINEL LYMPH NODE BIOPSY;  Surgeon: Jovita Kussmaul, MD;  Location: [facility];  Service: General;  Laterality: Right;  . BREAST LUMPECTOMY WITH RADIOACTIVE SEED LOCALIZATION Right 04/27/2019   Procedure: RIGHT BREAST LUMPECTOMY WITH RADIOACTIVE SEED LOCALIZATION;  Surgeon: Jovita Kussmaul, MD;  Location: [facility];  Service: General;  Laterality: Right;  . INTRAMEDULLARY (IM) NAIL INTERTROCHANTERIC Left 11/26/2020   Procedure: INTRAMEDULLARY (IM) NAIL INTERTROCHANTRIC;  Surgeon: Paralee Cancel, MD;  Location: [facility];  Service: Orthopedics;  Laterality: Left;  . RECTAL SURGERY     x 3  . REPLACEMENT TOTAL KNEE BILATERAL    . SYNOVECTOMY Left 07/21/2015   Procedure: LEFT KNEE SYNOVECTOMY with POLY EXCHANGE;  Surgeon: Vickey Huger, MD;  Location: [facility];  Service: Orthopedics;  Laterality: Left;  . TOTAL KNEE REVISION Right 07/05/2016   Procedure: TOTAL KNEE REVISION WITH SCAR DEBRIDEMENT/PATELLA REVISION WITH POLY EXCHANGE;  Surgeon: Dannielle Huh, MD;  Location: [facility];  Service: Orthopedics;  Laterality: Right;    Current Medications: Current Meds  Medication Sig  . acetaminophen (TYLENOL) 500 MG tablet Take 1,000 mg by mouth every 6 (six) hours as needed for moderate pain.  Marland Kitchen amLODipine-benazepril (LOTREL) 5-40 MG capsule Take 1 capsule by mouth daily.  Marland Kitchen anastrozole  (ARIMIDEX) 1 MG tablet Take 1 tablet (1 mg total) by mouth daily.  Marland Kitchen aspirin EC 81 MG tablet Take 1 tablet (81 mg total) by mouth every other day. Swallow whole.  . Calcium Carbonate-Vitamin D 600-400 MG-UNIT tablet Take 1 tablet by mouth 3 (three) times a week.   . Coenzyme Q10 (CO Q 10 PO) Take 200 mg by mouth every other day.   . furosemide (LASIX) 20 MG tablet Take 1 tablet (20 mg total) by mouth daily.  . hydrochlorothiazide (HYDRODIURIL) 25 MG tablet Take 25 mg by mouth daily.  . Hyprom-Naphaz-Polysorb-Zn Sulf (CLEAR EYES COMPLETE OP) Place 1 drop into both eyes daily as needed (dry eyes).  . metFORMIN (GLUCOPHAGE) 500 MG tablet Take 500 mg by mouth daily.  . mometasone (NASONEX) 50 MCG/ACT nasal spray Place 2 sprays into the nose 3 (three) times a week.  . Multiple Vitamin (MULTIVITAMIN WITH MINERALS) TABS tablet Take 1 tablet by mouth 3 (three) times a week.   . Omega-3 Fatty Acids (OMEGA 3 PO) Take 1 capsule by mouth 3 (three) times a week.   . polyethylene glycol (MIRALAX / GLYCOLAX) 17 g packet Take 17 g by mouth daily.  . potassium chloride SA (KLOR-CON) 20 MEQ tablet Take 1 tablet (20 mEq total) by mouth daily.  . Probiotic Product (ALIGN) 4 MG CAPS Take 4 mg by mouth daily.  . psyllium (HYDROCIL/METAMUCIL) 95 % PACK Take 1 packet by mouth daily.   . TURMERIC PO Take 500 mg by mouth every other day.  . vitamin E 400 UNIT capsule Take 400 Units by mouth every other day.  . [DISCONTINUED] aspirin EC 81 MG tablet Take 81 mg by mouth every other day. Swallow whole.  . [DISCONTINUED] aspirin EC 81 MG tablet Take 1 tablet (81 mg total) by mouth daily. Swallow whole.  . [DISCONTINUED] ezetimibe (ZETIA) 10 MG tablet Take 1 tablet (10 mg total) by mouth daily. FOLLOW UP DUE IN MAY 2022. PLEASE CALL AND SCHEDULE  . [DISCONTINUED] potassium chloride (KLOR-CON) 10 MEQ tablet Take 10 mEq by mouth daily.  . [DISCONTINUED] rosuvastatin (CRESTOR) 40 MG tablet TAKE ONE TABLET BY MOUTH ONCE DAILY      Allergies:   Tramadol   Social History   Socioeconomic History  . Marital status: Media planner    Spouse name: Not on file  . Number of children: Not on file  . Years of education: Not on file  . Highest education level: Not on file  Occupational History  . Not on file  Tobacco Use  . Smoking status: Never Smoker  . Smokeless tobacco: Never Used  Vaping Use  . Vaping Use: Never used  Substance and Sexual Activity  . Alcohol use: Yes    Comment: WINE OCC  . Drug use: No  . Sexual activity: Not on file  Other Topics Concern  . Not on file  Social History Narrative  . Not on file   Social Determinants  of Health   Financial Resource Strain: Not on file  Food Insecurity: Not on file  Transportation Needs: Not on file  Physical Activity: Not on file  Stress: Not on file  Social Connections: Not on file     Family History: The patient's family history includes Aneurysm in her father; Cancer in her mother.  ROS:   Please see the history of present illness.    Review of Systems  Constitutional: Negative for chills and fever.  HENT: Negative for hearing loss.   Eyes: Negative for blurred vision and redness.  Respiratory: Negative for shortness of breath.   Cardiovascular: Positive for leg swelling. Negative for chest pain, palpitations, orthopnea, claudication and PND.  Gastrointestinal: Negative for melena, nausea and vomiting.  Genitourinary: Negative for flank pain.  Musculoskeletal: Positive for falls, joint pain and myalgias.  Neurological: Negative for dizziness and loss of consciousness.  Endo/Heme/Allergies: Negative for polydipsia.  Psychiatric/Behavioral: Negative for substance abuse.    EKGs/Labs/Other Studies Reviewed:    The following studies were reviewed today: TTE 03-15-20 1. Left ventricular ejection fraction, by estimation, is 60 to 65%. The  left ventricle has normal function. The left ventricle has no regional  wall motion  abnormalities. There is mild left ventricular hypertrophy.  Left ventricular diastolic parameters  are consistent with Grade I diastolic dysfunction (impaired relaxation).  2. Right ventricular systolic function is normal. The right ventricular  size is normal. There is normal pulmonary artery systolic pressure. The  estimated right ventricular systolic pressure is [id number] mmHg.  3. The mitral valve is abnormal. Mild mitral valve regurgitation.  4. The aortic valve is tricuspid. Aortic valve regurgitation is not  visualized.  5. Aortic dilatation noted. 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  6. The inferior vena cava is normal in size with greater than 50%  respiratory variability, suggesting right atrial pressure of 3 mmHg.   Coronary CTA 2020-03-05 25-49% RCA 25-49% left main Proximal LAD 50-69% Mid LAD 50-69% Minimal less than 25% left circumflex CT FFR negative  EKG:  EKG is  ordered today.  The ekg ordered today demonstrates NSR with LAD, HR 70  Recent Labs: 02/21/2020: Magnesium 2.0; TSH 2.400 11/25/2020: ALT 19 11/29/2020: BUN 16; Creatinine, Ser 0.55; Hemoglobin 8.9; Platelets 190; Potassium 4.0; Sodium 137  Recent Lipid Panel    Component Value Date/Time   CHOL 142 02/21/2020 1140   CHOL 164 05/20/2016 1127   TRIG 62 02/21/2020 1140   TRIG 88 05/20/2016 1127   HDL 62 02/21/2020 1140   HDL 55 05/20/2016 1127   CHOLHDL 2.3 02/21/2020 1140   CHOLHDL 3.0 05/20/2016 1127   LDLCALC 67 02/21/2020 1140   LDLCALC 91 05/20/2016 1127      Physical Exam:    VS:  BP 118/72   Pulse 81   Ht 5\' 4"  (1.626 m)   Wt 170 lb (77.1 kg)   SpO2 95%   BMI 29.18 kg/m     Wt Readings from Last 3 Encounters:  02/17/21 170 lb (77.1 kg)  11/26/20 178 lb 9.2 oz (81 kg)  08/13/20 175 lb 11.2 oz (79.7 kg)     GEN:  Well nourished, well developed in no acute distress HEENT: Normal NECK: No JVD; No carotid bruits CARDIAC: RRR, no murmurs, rubs,  gallops RESPIRATORY:  Clear to auscultation without rales, wheezing or rhonchi  ABDOMEN: Soft, non-tender, non-distended MUSCULOSKELETAL:  Trace-1+ bilateral edema SKIN: Warm and dry NEUROLOGIC:  Alert and oriented x 3  PSYCHIATRIC:  Normal affect   ASSESSMENT:    1. Coronary artery disease involving native coronary artery of native heart without angina pectoris   2. Pure hypercholesterolemia   3. Essential (primary) hypertension   4. Medication management   5. Mixed hyperlipidemia   6. Hypokalemia   7. CAD in native artery   8. PVC (premature ventricular contraction)   9. Thoracic aortic aneurysm without rupture ([place])   10. Primary hypertension   11. Lower extremity edema    PLAN:    In order of problems listed above:  #PVCs/brief SVT Recent monitor with 11.3% PVC burden. TTE with normal LVEF, no significant valve disease, normal RV. Occasional palpitations but overall not very symptomatic. Doing well on metop. -Continue metop 25mg  XL daily   #Nonobstructive CAD Coronary CTA 02/2020 with 25-50% RCA, 25-50% LM, 50-70% pLAD, 50-70%mLAD, <25% LCx. FFR negative. No anginal symptoms.  -Start ASA 81mg  every other day per patient preference as has significant bruising with aspirin -Continue home Crestor 40mg  daily and Zetia 10mg  daily  #HLD: -Continue crestor 40mg  daily and zetia 10mg  daily -Repeat lipids today with goal LDL<70 -If LDL not at goal, will refer to lipid clinic for consideration of PCSK9i  #LE Edema: Developed post-operatively. Persists despite leg elevation and compression socks. -Hold HCTZ for now and start lasix 20mg  daily until LE edema resolved -Increase potassium to 55mEq daily while taking lasix -Once edema resolved; will stop lasix and resume HCTZ with additional lasix as needed for recurrent symptoms -Repeat BMET in 1 week to monitor renal function -Continue leg elevation and compression stockings  #Mechanical fall with left hip fracture: S/p ORIF.  Doing well and working with PT. -Follow-up with ortho as scheduled  #HTN: Well controlled. 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  Concerns regarding medicines are outlined above.  Orders Placed This Encounter  Procedures  . Lipid Profile  . Basic metabolic panel  . EKG 12-Lead   Meds ordered this encounter  Medications  . rosuvastatin (CRESTOR) 40 MG tablet    Sig: Take 1 tablet (40 mg total) by mouth daily.    Dispense:  90 tablet    Refill:  2  . ezetimibe (ZETIA) 10 MG tablet    Sig: Take 1 tablet (10 mg total) by mouth daily.    Dispense:  90 tablet    Refill:  2  . potassium chloride SA (KLOR-CON) 20 MEQ tablet    Sig: Take 1 tablet (20 mEq total) by mouth daily.    Dispense:  30 tablet    Refill:  0    Dose increase-take 20 mEq daily while on lasix  . furosemide (LASIX) 20 MG tablet    Sig: Take 1 tablet (20 mg total) by mouth daily.    Dispense:  30 tablet    Refill:  0    Hold HCTZ while taking this. Take 20 mEq of KDUR daily while taking this.  Marland Kitchen DISCONTD: aspirin EC 81 MG tablet    Sig: Take 1 tablet (81 mg total) by mouth daily. Swallow whole.    Dispense:  90 tablet    Refill:  3  . aspirin EC 81 MG tablet    Sig: Take 1 tablet (81 mg total) by mouth every other day. Swallow  whole.    Dispense:  90 tablet    Refill:  3    Patient Instructions  Medication Instructions:   HOLD YOUR HYDROCHLOROTHIAZIDE NOW AND WHILE YOU ARE TAKING LASIX PER DR. Johney Frame  START TAKING LASIX 20 MG BY MOUTH DAILY  INCREASE YOUR POTASSIUM SUPPLEMENT TO 20 mEq BY MOUTH DAILY WHILE TAKING LASIX  START TAKING ASPIRIN 81 MG BY MOUTH EVERY OTHER DAY  *If you need a refill on your cardiac medications  before your next appointment, please call your pharmacy*   Lab Work:  [place] WEEK--CHECK BMET  If you have labs (blood work) drawn today and your tests are completely normal, you will receive your results only by: Marland Kitchen MyChart Message (if you have MyChart) OR . A paper copy in the mail If you have any lab test that is abnormal or we need to change your treatment, we will call you to review the results.   Follow-Up:  3 MONTHS IN THE OFFICE WITH DR. Johney Frame       Signed, Freada Bergeron, MD  02/17/2021 1:23 PM    [facility]

## 2021-02-16 ENCOUNTER — Ambulatory Visit: Payer: PPO | Admitting: Cardiology

## 2021-02-17 ENCOUNTER — Encounter: Payer: Self-pay | Admitting: Cardiology

## 2021-02-17 ENCOUNTER — Ambulatory Visit: Payer: PPO | Admitting: Cardiology

## 2021-02-17 ENCOUNTER — Other Ambulatory Visit: Payer: Self-pay

## 2021-02-17 VITALS — BP 118/72 | HR 81 | Ht 64.0 in | Wt 170.0 lb

## 2021-02-17 DIAGNOSIS — I493 Ventricular premature depolarization: Secondary | ICD-10-CM | POA: Diagnosis not present

## 2021-02-17 DIAGNOSIS — I2510 Atherosclerotic heart disease of native coronary artery without angina pectoris: Secondary | ICD-10-CM | POA: Diagnosis not present

## 2021-02-17 DIAGNOSIS — E7800 Pure hypercholesterolemia, unspecified: Secondary | ICD-10-CM | POA: Diagnosis not present

## 2021-02-17 DIAGNOSIS — I10 Essential (primary) hypertension: Secondary | ICD-10-CM

## 2021-02-17 DIAGNOSIS — E782 Mixed hyperlipidemia: Secondary | ICD-10-CM

## 2021-02-17 DIAGNOSIS — R600 Localized edema: Secondary | ICD-10-CM

## 2021-02-17 DIAGNOSIS — I712 Thoracic aortic aneurysm, without rupture, unspecified: Secondary | ICD-10-CM

## 2021-02-17 DIAGNOSIS — E876 Hypokalemia: Secondary | ICD-10-CM | POA: Diagnosis not present

## 2021-02-17 DIAGNOSIS — Z79899 Other long term (current) drug therapy: Secondary | ICD-10-CM | POA: Diagnosis not present

## 2021-02-17 MED ORDER — EZETIMIBE 10 MG PO TABS: 10.0000 mg | ORAL_TABLET | Freq: Every day | ORAL | 2 refills | Status: DC

## 2021-02-17 MED ORDER — ROSUVASTATIN CALCIUM 40 MG PO TABS: 40.0000 mg | ORAL_TABLET | Freq: Every day | ORAL | 2 refills | Status: DC

## 2021-02-17 MED ORDER — FUROSEMIDE 20 MG PO TABS: 20.0000 mg | ORAL_TABLET | Freq: Every day | ORAL | 0 refills | Status: DC

## 2021-02-17 MED ORDER — POTASSIUM CHLORIDE CRYS ER 20 MEQ PO TBCR: 20.0000 meq | EXTENDED_RELEASE_TABLET | Freq: Every day | ORAL | 0 refills | Status: DC

## 2021-02-17 MED ORDER — ASPIRIN EC 81 MG PO TBEC: 81.0000 mg | DELAYED_RELEASE_TABLET | Freq: Every day | ORAL | 3 refills | Status: DC

## 2021-02-17 MED ORDER — ASPIRIN EC 81 MG PO TBEC: 81.0000 mg | DELAYED_RELEASE_TABLET | ORAL | 3 refills | Status: AC

## 2021-02-17 NOTE — Patient Instructions (Addendum)
Medication Instructions:   HOLD YOUR HYDROCHLOROTHIAZIDE NOW AND WHILE YOU ARE TAKING LASIX PER DR. Johney Frame  START TAKING LASIX 20 MG BY MOUTH DAILY  INCREASE YOUR POTASSIUM SUPPLEMENT TO 20 mEq BY MOUTH DAILY WHILE TAKING LASIX  START TAKING ASPIRIN 81 MG BY MOUTH EVERY OTHER DAY  *If you need a refill on your cardiac medications before your next appointment, please call your pharmacy*   Lab Work:  [place] WEEK--CHECK BMET  If you have labs (blood work) drawn today and your tests are completely normal, you will receive your results only by: Marland Kitchen MyChart Message (if you have MyChart) OR . A paper copy in the mail If you have any lab test that is abnormal or we need to change your treatment, we will call you to review the results.   Follow-Up:  3 MONTHS IN THE OFFICE WITH DR. Johney Frame

## 2021-02-18 ENCOUNTER — Telehealth: Payer: Self-pay | Admitting: Cardiology

## 2021-02-18 DIAGNOSIS — R269 Unspecified abnormalities of gait and mobility: Secondary | ICD-10-CM | POA: Diagnosis not present

## 2021-02-18 DIAGNOSIS — M25552 Pain in left hip: Secondary | ICD-10-CM | POA: Diagnosis not present

## 2021-02-18 DIAGNOSIS — M545 Low back pain, unspecified: Secondary | ICD-10-CM | POA: Diagnosis not present

## 2021-02-18 LAB — LIPID PANEL
Chol/HDL Ratio: 2.5 ratio (ref 0.0–4.4)
Cholesterol, Total: 140 mg/dL (ref 100–199)
HDL: 57 mg/dL (ref >39)
LDL Chol Calc (NIH): 65 mg/dL (ref 0–99)
Triglycerides: 96 mg/dL (ref 0–149)
VLDL Cholesterol Cal: 18 mg/dL (ref 5–40)

## 2021-02-18 NOTE — Telephone Encounter (Signed)
Preliminary report for her labs are in, but Dr. Johney Frame still needs to further review and advise on them. Will return a call back to the pt, after lab results have been reviewed by MD.

## 2021-02-18 NOTE — Telephone Encounter (Signed)
She states she is leaving for physical therapy at 1:30 PM and she won't return home until about 4:00 PM.

## 2021-02-18 NOTE — Telephone Encounter (Signed)
Patient is requesting to discuss her lab results again.

## 2021-02-19 ENCOUNTER — Encounter: Payer: Self-pay | Admitting: Rehabilitation

## 2021-02-19 ENCOUNTER — Other Ambulatory Visit: Payer: Self-pay

## 2021-02-19 ENCOUNTER — Ambulatory Visit: Payer: PPO | Attending: General Surgery | Admitting: Rehabilitation

## 2021-02-19 DIAGNOSIS — L599 Disorder of the skin and subcutaneous tissue related to radiation, unspecified: Secondary | ICD-10-CM | POA: Diagnosis not present

## 2021-02-19 DIAGNOSIS — C50511 Malignant neoplasm of lower-outer quadrant of right female breast: Secondary | ICD-10-CM | POA: Insufficient documentation

## 2021-02-19 DIAGNOSIS — I890 Lymphedema, not elsewhere classified: Secondary | ICD-10-CM

## 2021-02-19 DIAGNOSIS — Z170 Estrogen receptor positive status [ER+]: Secondary | ICD-10-CM | POA: Insufficient documentation

## 2021-02-19 NOTE — Telephone Encounter (Signed)
Nuala Alpha, LPN  06/14/91 3:30 AM EDT Back to Top     The patient has been notified of the result and verbalized understanding. All questions (if any) were answered. Nuala Alpha, LPN 0/76/2263 3:35 AM   Pt aware to continue her current med regimen. Pt verbalized understanding and agrees with this plan.   Freada Bergeron, MD  02/18/2021 8:24 PM EDT      Her cholesterol looks great and her LDL is at goal. She does not need any more medications.

## 2021-02-19 NOTE — Therapy (Signed)
[place], [place], [address] Phone: [phone]   Fax:  [phone]  Physical Therapy Evaluation  Patient Details  Name: Andrea Farmer MRN: [id number] Date of Birth: [birth year] Referring Provider (PT): Dr. [name]   Encounter Date: 02/19/2021   PT End of Session - 02/19/21 1455    Visit Number 1    Number of Visits 5    Date for PT Re-Evaluation 04/16/21    PT Start Time 7619    PT Stop Time 1454    PT Time Calculation (min) 49 min    Activity Tolerance Patient tolerated treatment well    Behavior During Therapy [facility] for tasks assessed/performed           Past Medical History:  Diagnosis Date  . Acid reflux   . Back pain    arthritis  . Cataract    immature but doesn't know which eye  . Diabetes mellitus without complication ([place])   . High cholesterol    takes Crestor daily  . History of colon polyps    benign  . History of migraine   . Hypertension    takes Lotrel and HCTZ dailydaily  . Irritable bowel syndrome 03/11/2016  . Joint pain   . Joint swelling   . Osteoarthritis of knee 03/11/2016  . Pneumonia    30 plus yrs ago    Past Surgical History:  Procedure Laterality Date  . ABDOMINAL HYSTERECTOMY    . BREAST BIOPSY Right 05/14/2015  . BREAST EXCISIONAL BIOPSY Left   . BREAST EXCISIONAL BIOPSY Right   . BREAST LUMPECTOMY WITH RADIOACTIVE SEED AND SENTINEL LYMPH NODE BIOPSY Right 03/20/2020   Procedure: RIGHT BREAST LUMPECTOMY WITH RADIOACTIVE SEED AND SENTINEL LYMPH NODE BIOPSY;  Surgeon: Jovita Kussmaul, MD;  Location[facility];  Service: General;  Laterality: Right;  . BREAST LUMPECTOMY WITH RADIOACTIVE SEED LOCALIZATION Right 04/27/2019   Procedure: RIGHT BREAST LUMPECTOMY WITH RADIOACTIVE SEED LOCALIZATION;  Surgeon: Jovita Kussmaul, MD;  Location: [place];  Service: General;  Laterality: Right;  . INTRAMEDULLARY (IM) NAIL INTERTROCHANTERIC Left  11/26/2020   Procedure: INTRAMEDULLARY (IM) NAIL INTERTROCHANTRIC;  Surgeon: Paralee Cancel, MD;  [facility]: [facility];  Service: Orthopedics;  Laterality: Left;  . RECTAL SURGERY     x 3  . REPLACEMENT TOTAL KNEE BILATERAL    . SYNOVECTOMY Left 07/21/2015   Procedure: LEFT KNEE SYNOVECTOMY with POLY EXCHANGE;  Surgeon: Vickey Huger, MD;  Location: [place];  Service: Orthopedics;  Laterality: Left;  . TOTAL KNEE REVISION Right 07/05/2016   Procedure: TOTAL KNEE REVISION WITH SCAR DEBRIDEMENT/PATELLA REVISION WITH POLY EXCHANGE;  Surgeon: Vickey Huger, MD;  Location: [facility];  Service: Orthopedics;  Laterality: Right;    There were no vitals filed for this visit.    Subjective Assessment - 02/19/21 1406    Subjective The breast feels very warm and swollen.  It has been that way since radiation.  Maybe some swelling in the armpit.    Pertinent History Rt lumpectomy 03/20/20 by Dr. [name] due to ER positive IDC 0/4 nodes with completion of radiation, Other history includes: DM, bil TKR, recent Lt ORIF due to fall and hip fracture (februrary)    Limitations --   none   Patient Stated Goals what to do for the swelling    Currently in Pain? No/denies   every once in a while some pain  [name] PT Assessment - 02/19/21 0001      Assessment   Medical Diagnosis Rt breast cancer    Referring Provider (PT) Dr. [name]    Onset Date/Surgical Date 03/20/20    Hand Dominance Right      Precautions   Precaution Comments lymphedema Rt UE/breast      Restrictions   Weight Bearing Restrictions No    Other Position/Activity Restrictions no restrictions      Balance Screen   Has the patient fallen in the past 6 months Yes    How many times? 1    Has the patient had a decrease in activity level because of a fear of falling?  No    Is the patient reluctant to leave their home because of a fear of falling?  No      Home Ecologist residence    Living Arrangements  Spouse/significant other      Prior Function   Level of Independence Independent with household mobility with device    Vocation Retired    Leisure Education officer, community, bicycle      Cognition   Overall Cognitive Status Within Functional Limits for tasks assessed      Observation/Other Assessments   Observations Rt breast smaller from radiation but enlarged pores and edema noted especially in the lower breast    Skin Integrity multiple lumpectomy incisions      Posture/Postural Control   Posture/Postural Control Postural limitations    Postural Limitations Rounded Shoulders;Increased thoracic kyphosis      ROM / Strength   AROM / PROM / Strength --   reports no troubles     Palpation   Palpation comment increased scar tissue 2-3 lumpectomy incisions, fibrosis inferior breast                      Objective measurements completed on examination: See above findings.       [facility] Adult PT Treatment/Exercise - 02/19/21 0001      Self-Care   Self-Care Other Self-Care Comments    Other Self-Care Comments  where to get bra and swell spot, self MLD for the Rt breast performed in seated in front of the mirror; all steps reviewed with hand over hand and vcs/tcs as needed.  Good performance with cueing.                  PT Education - 02/19/21 1455    Education Details self MLD Rt breast, where to get bra and swell spot, POC    Person(s) Educated Patient    Methods Explanation;Demonstration;Tactile cues;Verbal cues;Handout    Comprehension Verbalized understanding;Returned demonstration;Tactile cues required;Verbal cues required;Need further instruction               PT Long Term Goals - 02/19/21 1500      PT LONG TERM GOAL #1   Title Pt will be ind with self skin care to include scar massage and breast MLD    Time 8    Period Weeks    Status New      PT LONG TERM GOAL #2   Title Pt will obtain appropriate compression bra with insert    Time 8    Period  Weeks    Status New      PT LONG TERM GOAL #3   Title Pt will report decreased breast heaviness by 50%  Plan - 02/19/21 1456    Clinical Impression Statement Pt presents with Rt breast lymphedema 11 months post lumpectomy and radiation.  Pt with inferior breast fibrosis and scar tissue fibrosis in most recent lumpectomy incision as well as 2 old incisions.  Pt is current doing PT for her hip fracture and will be done at the end of June.  Pt will follow up after 3 weeks of self massage and obtaining bra and swell spot for further treatment.    Personal Factors and Comorbidities Age;Comorbidity 2    Comorbidities SLNB, radiation history    Examination-Activity Limitations --   none   Examination-Participation Restrictions --   none   Stability/Clinical Decision Making Stable/Uncomplicated    Clinical Decision Making Low    Rehab Potential Excellent    PT Frequency 1x / week    PT Duration 8 weeks    PT Treatment/Interventions ADLs/Self Care Home Management;Therapeutic exercise;Patient/family education;Manual lymph drainage;Taping    PT Next Visit Plan how has self care been? gotten bra and swell spot? how is MLD? review and perform Rt breast MLD and scar release until ind with self care    Consulted and Agree with Plan of Care Patient           Patient will benefit from skilled therapeutic intervention in order to improve the following deficits and impairments:  Decreased knowledge of use of DME,Increased edema,Decreased skin integrity  Visit Diagnosis: Malignant neoplasm of lower-outer quadrant of right breast of female, estrogen receptor positive ([place])  Lymphedema, not elsewhere classified  Disorder of the skin and subcutaneous tissue related to radiation, unspecified     Problem List Patient Active Problem List   Diagnosis Date Noted  . Closed left hip fracture, initial encounter ([place]) 11/25/2020  . Malignant neoplasm of lower-outer quadrant of  right breast of female, estrogen receptor positive ([place]) 03/12/2020  . Abnormal computed tomography angiography (CTA) 04/08/2016  . Abnormal stress test 04/02/2016  . Cardiac ischemia 04/02/2016  . Pure hypercholesterolemia 03/11/2016  . Abdominal distension (gaseous) 03/11/2016  . Abdominal pain, epigastric 03/11/2016  . Environmental and seasonal allergies 03/11/2016  . Anal or rectal pain 03/11/2016  . Dyspepsia 03/11/2016  . Other abnormal glucose 03/11/2016  . Essential (primary) hypertension 03/11/2016  . Cardiac arrhythmia 03/11/2016  . Irritable bowel syndrome 03/11/2016  . Osteoarthritis of knee 03/11/2016  . Osteopenia 03/11/2016  . Pain in joint, lower leg 03/11/2016  . History of colonic polyps 03/11/2016  . Slow transit constipation 03/11/2016  . PVC (premature ventricular contraction) 03/11/2016  . SOB (shortness of breath) 03/11/2016  . 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 Hemorrhoid 12/07/2010    Stark Bray 02/19/2021, 3:01 PM  [place], [place], [address] Phone: [phone]   Fax:  [phone]  Name: LYNEA ROLLISON MRN: [id number] Date of Birth: [birth year]

## 2021-02-24 ENCOUNTER — Other Ambulatory Visit: Payer: Self-pay

## 2021-02-24 ENCOUNTER — Other Ambulatory Visit: Payer: PPO

## 2021-02-24 DIAGNOSIS — E7800 Pure hypercholesterolemia, unspecified: Secondary | ICD-10-CM | POA: Diagnosis not present

## 2021-02-24 DIAGNOSIS — E782 Mixed hyperlipidemia: Secondary | ICD-10-CM

## 2021-02-24 DIAGNOSIS — Z79899 Other long term (current) drug therapy: Secondary | ICD-10-CM

## 2021-02-24 DIAGNOSIS — I2510 Atherosclerotic heart disease of native coronary artery without angina pectoris: Secondary | ICD-10-CM | POA: Diagnosis not present

## 2021-02-24 DIAGNOSIS — I10 Essential (primary) hypertension: Secondary | ICD-10-CM | POA: Diagnosis not present

## 2021-02-24 LAB — BASIC METABOLIC PANEL
BUN/Creatinine Ratio: 22 (ref 12–28)
BUN: 13 mg/dL (ref 8–27)
CO2: 21 mmol/L (ref 20–29)
Calcium: 10.7 mg/dL — ABNORMAL HIGH (ref 8.7–10.3)
Chloride: 102 mmol/L (ref 96–106)
Creatinine, Ser: 0.6 mg/dL (ref 0.57–1.00)
Glucose: 108 mg/dL — ABNORMAL HIGH (ref 65–99)
Potassium: 4.3 mmol/L (ref 3.5–5.2)
Sodium: 141 mmol/L (ref 134–144)
eGFR: 92 mL/min/{1.73_m2} (ref >59)

## 2021-02-25 ENCOUNTER — Telehealth: Payer: Self-pay | Admitting: *Deleted

## 2021-02-25 DIAGNOSIS — R269 Unspecified abnormalities of gait and mobility: Secondary | ICD-10-CM | POA: Diagnosis not present

## 2021-02-25 DIAGNOSIS — Z79899 Other long term (current) drug therapy: Secondary | ICD-10-CM

## 2021-02-25 DIAGNOSIS — M25552 Pain in left hip: Secondary | ICD-10-CM | POA: Diagnosis not present

## 2021-02-25 DIAGNOSIS — I2510 Atherosclerotic heart disease of native coronary artery without angina pectoris: Secondary | ICD-10-CM

## 2021-02-25 DIAGNOSIS — E782 Mixed hyperlipidemia: Secondary | ICD-10-CM

## 2021-02-25 DIAGNOSIS — M545 Low back pain, unspecified: Secondary | ICD-10-CM | POA: Diagnosis not present

## 2021-02-25 DIAGNOSIS — I10 Essential (primary) hypertension: Secondary | ICD-10-CM

## 2021-02-25 DIAGNOSIS — E7800 Pure hypercholesterolemia, unspecified: Secondary | ICD-10-CM

## 2021-02-25 MED ORDER — FUROSEMIDE 20 MG PO TABS: 20.0000 mg | ORAL_TABLET | Freq: Every day | ORAL | 1 refills | Status: DC

## 2021-02-25 NOTE — Telephone Encounter (Signed)
Spoke with the pt and endorsed to her recommendations per Dr. Johney Frame. Advised the pt to continue her lasix 20 mg po daily for now, and she may take an additional dose as needed, up to 3 times per week, for increase swelling.  Advised the pt to continue holding off on her HCTZ.  Confirmed the pharmacy of choice with the pt.  Pt verbalized understanding and agrees with this plan.

## 2021-02-25 NOTE — Telephone Encounter (Signed)
-----   Message from Freada Bergeron, MD sent at 02/25/2021  5:35 PM EDT ----- Continue lasix 20mg  daily for now. She can take an additional dose up to 3x/week if the swelling worsens. We will continue to hold the HCTZ.  ----- Message ----- From: Nuala Alpha, LPN Sent: 10/27/3565   9:39 AM EDT To: Nuala Alpha, LPN, Freada Bergeron, MD  Review and advise:  swelling has only slightly improved in her lower extremities, mainly tops of her feet and ankles.  No other cardiac complaints.  Please advise!  Thanks, Andrea Farmer   ----- Message ----- From: Freada Bergeron, MD Sent: 02/24/2021   5:37 PM EDT To: Nuala Alpha, LPN  Thank you so much for detailing the lab check. Her kidney function looks great. How is her swelling?

## 2021-02-27 DIAGNOSIS — M25552 Pain in left hip: Secondary | ICD-10-CM | POA: Diagnosis not present

## 2021-02-27 DIAGNOSIS — R269 Unspecified abnormalities of gait and mobility: Secondary | ICD-10-CM | POA: Diagnosis not present

## 2021-02-27 DIAGNOSIS — M545 Low back pain, unspecified: Secondary | ICD-10-CM | POA: Diagnosis not present

## 2021-03-02 DIAGNOSIS — R269 Unspecified abnormalities of gait and mobility: Secondary | ICD-10-CM | POA: Diagnosis not present

## 2021-03-02 DIAGNOSIS — M545 Low back pain, unspecified: Secondary | ICD-10-CM | POA: Diagnosis not present

## 2021-03-02 DIAGNOSIS — M25552 Pain in left hip: Secondary | ICD-10-CM | POA: Diagnosis not present

## 2021-03-05 DIAGNOSIS — M545 Low back pain, unspecified: Secondary | ICD-10-CM | POA: Diagnosis not present

## 2021-03-05 DIAGNOSIS — R269 Unspecified abnormalities of gait and mobility: Secondary | ICD-10-CM | POA: Diagnosis not present

## 2021-03-05 DIAGNOSIS — M25552 Pain in left hip: Secondary | ICD-10-CM | POA: Diagnosis not present

## 2021-03-10 DIAGNOSIS — M25552 Pain in left hip: Secondary | ICD-10-CM | POA: Diagnosis not present

## 2021-03-10 DIAGNOSIS — R269 Unspecified abnormalities of gait and mobility: Secondary | ICD-10-CM | POA: Diagnosis not present

## 2021-03-10 DIAGNOSIS — M545 Low back pain, unspecified: Secondary | ICD-10-CM | POA: Diagnosis not present

## 2021-03-11 ENCOUNTER — Other Ambulatory Visit: Payer: Self-pay

## 2021-03-11 ENCOUNTER — Ambulatory Visit: Payer: PPO | Attending: General Surgery

## 2021-03-11 DIAGNOSIS — I890 Lymphedema, not elsewhere classified: Secondary | ICD-10-CM | POA: Diagnosis not present

## 2021-03-11 DIAGNOSIS — C50511 Malignant neoplasm of lower-outer quadrant of right female breast: Secondary | ICD-10-CM | POA: Insufficient documentation

## 2021-03-11 DIAGNOSIS — Z170 Estrogen receptor positive status [ER+]: Secondary | ICD-10-CM | POA: Insufficient documentation

## 2021-03-11 DIAGNOSIS — L599 Disorder of the skin and subcutaneous tissue related to radiation, unspecified: Secondary | ICD-10-CM | POA: Insufficient documentation

## 2021-03-11 NOTE — Therapy (Signed)
[place], [place], [address] Phone: [phone]   Fax:  [phone]  Physical Therapy Treatment  Patient Details  Name: Andrea Farmer MRN: [id number] Date of Birth: [birth year] Referring Provider (PT): Dr. [name]   Encounter Date: 03/11/2021   PT End of Session - 03/11/21 1215    Visit Number 2    Number of Visits 5    Date for PT Re-Evaluation 04/16/21    PT Start Time 1108    PT Stop Time 1208    PT Time Calculation (min) 60 min    Activity Tolerance Patient tolerated treatment well    Behavior During Therapy [facility] for tasks assessed/performed           Past Medical History:  Diagnosis Date  . Acid reflux   . Back pain    arthritis  . Cataract    immature but doesn't know which eye  . Diabetes mellitus without complication ([place])   . High cholesterol    takes Crestor daily  . History of colon polyps    benign  . History of migraine   . Hypertension    takes Lotrel and HCTZ dailydaily  . Irritable bowel syndrome 03/11/2016  . Joint pain   . Joint swelling   . Osteoarthritis of knee 03/11/2016  . Pneumonia    30 plus yrs ago    Past Surgical History:  Procedure Laterality Date  . ABDOMINAL HYSTERECTOMY    . BREAST BIOPSY Right 05/14/2015  . BREAST EXCISIONAL BIOPSY Left   . BREAST EXCISIONAL BIOPSY Right   . BREAST LUMPECTOMY WITH RADIOACTIVE SEED AND SENTINEL LYMPH NODE BIOPSY Right 03/20/2020   Procedure: RIGHT BREAST LUMPECTOMY WITH RADIOACTIVE SEED AND SENTINEL LYMPH NODE BIOPSY;  Surgeon: Jovita Kussmaul, MD;  Location: [place];  Service: General;  Laterality: Right;  . BREAST LUMPECTOMY WITH RADIOACTIVE SEED LOCALIZATION Right 04/27/2019   Procedure: RIGHT BREAST LUMPECTOMY WITH RADIOACTIVE SEED LOCALIZATION;  Surgeon: Jovita Kussmaul, MD;  Location: [facility];  Service: General;  Laterality: Right;  . INTRAMEDULLARY (IM) NAIL INTERTROCHANTERIC Left  11/26/2020   Procedure: INTRAMEDULLARY (IM) NAIL INTERTROCHANTRIC;  Surgeon: Paralee Cancel, MD;  [facility];  Service: Orthopedics;  Laterality: Left;  . RECTAL SURGERY     x 3  . REPLACEMENT TOTAL KNEE BILATERAL    . SYNOVECTOMY Left 07/21/2015   Procedure: LEFT KNEE SYNOVECTOMY with POLY EXCHANGE;  Surgeon: Vickey Huger, MD;  Location: [facility];  Service: Orthopedics;  Laterality: Left;  . TOTAL KNEE REVISION Right 07/05/2016   Procedure: TOTAL KNEE REVISION WITH SCAR DEBRIDEMENT/PATELLA REVISION WITH POLY EXCHANGE;  Surgeon: Vickey Huger, MD;  Location: [place];  Service: Orthopedics;  Laterality: Right;    There were no vitals filed for this visit.   Subjective Assessment - 03/11/21 1112    Subjective I called [facility] 2 weeks ago and the soonest they could see me is this Friday so I'm going then to be measured for a compression bra. I've been doing the self MLD at home but not too sure how I'm doing with that so I want you to review this with me. And the "twinges" of pain I had been experiencing are getting less frequent.    Pertinent History Rt lumpectomy 03/20/20 by Dr. [name] due to ER positive IDC 0/4 nodes with completion of radiation, Other history includes: DM, bil TKR, recent Lt ORIF due to fall and hip fracture (februrary)  Patient Stated Goals what to do for the swelling    Currently in Pain? No/denies                             [facility] Adult PT Treatment/Exercise - 03/11/21 0001      Self-Care   Other Self-Care Comments  Reviewed importance of performing self MLD daily and answered questions about expectations of treatment.      Manual Therapy   Manual Therapy Manual Lymphatic Drainage (MLD);Edema management    Edema Management Issued rectangle of peach Medi foam with small dots for pt to wear in her bra at inferior aspect of breast where fibrosis palpable.    Manual Lymphatic Drainage (MLD) In Supine: short neck, superfical and deep abdominals, Rt  inguinal and Lt axillary nodes, Rt axillo-inguinal and anterior inter-axillary anastomosis, then focused on Rt breast, then into partial Lt S/L for further work to Rt lateral breast redirecting towards Rt axillo-inguinal and posterior inter-axillary anastomosis, finished retracing all steps in supine; had pt return each step assessing technique (she required VCs for lighter pressure and more effetive skin stretch) and had husband return some demo as well when pt in partial Lt S/L.                       PT Long Term Goals - 02/19/21 1500      PT LONG TERM GOAL #1   Title Pt will be ind with self skin care to include scar massage and breast MLD    Time 8    Period Weeks    Status New      PT LONG TERM GOAL #2   Title Pt will obtain appropriate compression bra with insert    Time 8    Period Weeks    Status New      PT LONG TERM GOAL #3   Title Pt will report decreased breast heaviness by 50%                 Plan - 03/11/21 1836    Clinical Impression Statement Pt reports she has been trying self manual lymph drainage but not sure if her technique is correct so reviewed this with pt while performing today. Had her return demonstration of this with hand over hand technique and VCs for correct skin stretch with no slide. She was able to demonstrate this better after multiple cues but will benefit from further review of this. Also briefly instructed pts husband in same with pt in partial Lt S/L  as she struggles to reach the lateral aspect of her breast. He was able to return good demo of redirecting lateral breast fluid to Rt axillo-inguinal anastomosis. Also issued peach medi compression foam for pt to wear at the inferior aspect of her breast where fibrosis most palpable. She has an appt to be measured for a compression bra at [facility] but they were not able to see her until the end of the month. So, encouraged her to focus on self MLD daily and wear compression foam  as much as able until she gets a compression bra. Pt verblaized understanding all.    Personal Factors and Comorbidities Age;Comorbidity 2    Comorbidities SLNB, radiation history    Examination-Activity Limitations Other   none   Stability/Clinical Decision Making Stable/Uncomplicated    Rehab Potential Excellent    PT Frequency 1x / week    PT  Duration 8 weeks    PT Treatment/Interventions ADLs/Self Care Home Management;Therapeutic exercise;Patient/family education;Manual lymph drainage;Taping    PT Next Visit Plan gotten bra and swell spot? cont and review Rt breast MLD assessing pts (husbands prn) technique and scar release until ind with self care    PT Home Exercise Plan Self MLD daily    Consulted and Agree with Plan of Care Patient;Family member/caregiver    Family Member Consulted Husband, Tom           Patient will benefit from skilled therapeutic intervention in order to improve the following deficits and impairments:  Decreased knowledge of use of DME,Increased edema,Decreased skin integrity  Visit Diagnosis: Malignant neoplasm of lower-outer quadrant of right breast of female, estrogen receptor positive ([place])  Lymphedema, not elsewhere classified  Disorder of the skin and subcutaneous tissue related to radiation, unspecified     Problem List Patient Active Problem List   Diagnosis Date Noted  . Closed left hip fracture, initial encounter ([place]) 11/25/2020  . Malignant neoplasm of lower-outer quadrant of right breast of female, estrogen receptor positive ([place]) 03/12/2020  . Abnormal computed tomography angiography (CTA) 04/08/2016  . Abnormal stress test 04/02/2016  . Cardiac ischemia 04/02/2016  . Pure hypercholesterolemia 03/11/2016  . Abdominal distension (gaseous) 03/11/2016  . Abdominal pain, epigastric 03/11/2016  . Environmental and seasonal allergies 03/11/2016  . Anal or rectal pain 03/11/2016  . Dyspepsia 03/11/2016  . Other abnormal glucose 03/11/2016   . Essential (primary) hypertension 03/11/2016  . Cardiac arrhythmia 03/11/2016  . Irritable bowel syndrome 03/11/2016  . Osteoarthritis of knee 03/11/2016  . Osteopenia 03/11/2016  . Pain in joint, lower leg 03/11/2016  . History of colonic polyps 03/11/2016  . Slow transit constipation 03/11/2016  . PVC (premature ventricular contraction) 03/11/2016  . SOB (shortness of breath) 03/11/2016  . 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 Hemorrhoid 12/07/2010    Otelia Limes, PTA 03/11/2021, 6:53 PM  [place], [place], [address] Phone: [phone]   Fax:  [phone]  Name: EMMAMARIE KLUENDER MRN: [id number] Date of Birth: [birth year]

## 2021-03-12 DIAGNOSIS — M545 Low back pain, unspecified: Secondary | ICD-10-CM | POA: Diagnosis not present

## 2021-03-12 DIAGNOSIS — R269 Unspecified abnormalities of gait and mobility: Secondary | ICD-10-CM | POA: Diagnosis not present

## 2021-03-12 DIAGNOSIS — M25552 Pain in left hip: Secondary | ICD-10-CM | POA: Diagnosis not present

## 2021-03-13 DIAGNOSIS — C50911 Malignant neoplasm of unspecified site of right female breast: Secondary | ICD-10-CM | POA: Diagnosis not present

## 2021-03-15 ENCOUNTER — Other Ambulatory Visit: Payer: Self-pay | Admitting: Hematology and Oncology

## 2021-03-16 DIAGNOSIS — M545 Low back pain, unspecified: Secondary | ICD-10-CM | POA: Diagnosis not present

## 2021-03-16 DIAGNOSIS — M25552 Pain in left hip: Secondary | ICD-10-CM | POA: Diagnosis not present

## 2021-03-16 DIAGNOSIS — R269 Unspecified abnormalities of gait and mobility: Secondary | ICD-10-CM | POA: Diagnosis not present

## 2021-03-18 ENCOUNTER — Ambulatory Visit: Payer: PPO

## 2021-03-18 ENCOUNTER — Other Ambulatory Visit: Payer: Self-pay

## 2021-03-18 DIAGNOSIS — I890 Lymphedema, not elsewhere classified: Secondary | ICD-10-CM

## 2021-03-18 DIAGNOSIS — C50511 Malignant neoplasm of lower-outer quadrant of right female breast: Secondary | ICD-10-CM | POA: Diagnosis not present

## 2021-03-18 DIAGNOSIS — L599 Disorder of the skin and subcutaneous tissue related to radiation, unspecified: Secondary | ICD-10-CM

## 2021-03-18 NOTE — Therapy (Signed)
[place], [place], [address] Phone: [phone]   Fax:  [phone]  Physical Therapy Treatment  Patient Details  Name: Andrea Farmer MRN: [id number] Date of Birth: 1941-02-19 Referring Provider (PT): Dr. [name]   Encounter Date: 03/18/2021   PT End of Session - 03/18/21 1244    Visit Number 3    Number of Visits 5    Date for PT Re-Evaluation 04/16/21    PT Start Time 1107    PT Stop Time 1206    PT Time Calculation (min) 59 min    Activity Tolerance Patient tolerated treatment well    Behavior During Therapy [facility] for tasks assessed/performed           Past Medical History:  Diagnosis Date  . Acid reflux   . Back pain    arthritis  . Cataract    immature but doesn't know which eye  . Diabetes mellitus without complication ([place])   . High cholesterol    takes Crestor daily  . History of colon polyps    benign  . History of migraine   . Hypertension    takes Lotrel and HCTZ dailydaily  . Irritable bowel syndrome 03/11/2016  . Joint pain   . Joint swelling   . Osteoarthritis of knee 03/11/2016  . Pneumonia    30 plus yrs ago    Past Surgical History:  Procedure Laterality Date  . ABDOMINAL HYSTERECTOMY    . BREAST BIOPSY Right 05/14/2015  . BREAST EXCISIONAL BIOPSY Left   . BREAST EXCISIONAL BIOPSY Right   . BREAST LUMPECTOMY WITH RADIOACTIVE SEED AND SENTINEL LYMPH NODE BIOPSY Right 03/20/2020   Procedure: RIGHT BREAST LUMPECTOMY WITH RADIOACTIVE SEED AND SENTINEL LYMPH NODE BIOPSY;  Surgeon: Jovita Kussmaul, MD;  Location: [place];  Service: General;  Laterality: Right;  . BREAST LUMPECTOMY WITH RADIOACTIVE SEED LOCALIZATION Right 04/27/2019   Procedure: RIGHT BREAST LUMPECTOMY WITH RADIOACTIVE SEED LOCALIZATION;  Surgeon: Jovita Kussmaul, MD;  Location: [place];  Service: General;  Laterality: Right;  . INTRAMEDULLARY (IM) NAIL INTERTROCHANTERIC Left  11/26/2020   Procedure: INTRAMEDULLARY (IM) NAIL INTERTROCHANTRIC;  Surgeon: Paralee Cancel, MD;  [facility];  Service: Orthopedics;  Laterality: Left;  . RECTAL SURGERY     x 3  . REPLACEMENT TOTAL KNEE BILATERAL    . SYNOVECTOMY Left 07/21/2015   Procedure: LEFT KNEE SYNOVECTOMY with POLY EXCHANGE;  Surgeon: Vickey Huger, MD;  Location: [place];  Service: Orthopedics;  Laterality: Left;  . TOTAL KNEE REVISION Right 07/05/2016   Procedure: TOTAL KNEE REVISION WITH SCAR DEBRIDEMENT/PATELLA REVISION WITH POLY EXCHANGE;  Surgeon: Vickey Huger, MD;  Location: [place];  Service: Orthopedics;  Laterality: Right;    There were no vitals filed for this visit.   Subjective Assessment - 03/18/21 1114    Subjective I got my new compression bras and although they are tighter than I'm used to, they are seeming to help. We are also trying to do the self MLD and I think we are doing okay. I'm thinking I don't need to come back for about 4 or so weeks for a final assess.    Pertinent History Rt lumpectomy 03/20/20 by Dr. [name] due to ER positive IDC 0/4 nodes with completion of radiation, Other history includes: DM, bil TKR, recent Lt ORIF due to fall and hip fracture (februrary)    Patient Stated Goals what to do for the swelling  Currently in Pain? No/denies                             [facility] Adult PT Treatment/Exercise - 03/18/21 0001      Manual Therapy   Manual Therapy Myofascial release;Manual Lymphatic Drainage (MLD)    Edema Management Assessed pts new compression bra and this is excellent fit. She did not get the swell spot as it would have been too tight in her bra.    Manual Lymphatic Drainage (MLD) In Supine: short neck, superfical and deep abdominals, Rt inguinal and Lt axillary nodes, Lt intact Rt axillo-inguinal and anterior inter-axillary anastomosis, then focused on Rt breast, then into partial Lt S/L for further work to Rt lateral breast redirecting towards Rt  axillo-inguinal and posterior inter-axillary anastomosis, finished retracing all steps in supine reviewing with pt while performing                       PT Long Term Goals - 02/19/21 1500      PT LONG TERM GOAL #1   Title Pt will be ind with self skin care to include scar massage and breast MLD    Time 8    Period Weeks    Status New      PT LONG TERM GOAL #2   Title Pt will obtain appropriate compression bra with insert    Time 8    Period Weeks    Status New      PT LONG TERM GOAL #3   Title Pt will report decreased breast heaviness by 50%                 Plan - 03/18/21 1244    Clinical Impression Statement Pt is making excellent progress. She has received her new compression bras and reports wearing this 24/7. She has also been doing her self manual lymph drainage and there was noticeable decrease in fibrosis of her Rt breast today at the medial and lateral aspect. Reviewed technique with pt but she was doing this well. She would like to have one more visit in about 4 weeks to work on self management of symptoms so scheduled one more visit on 04/15/21.    Personal Factors and Comorbidities Age;Comorbidity 2    Comorbidities SLNB, radiation history    Examination-Activity Limitations Other   none   Examination-Participation Restrictions --   none   Stability/Clinical Decision Making Stable/Uncomplicated    Rehab Potential Excellent    PT Frequency 1x / week    PT Duration 8 weeks    PT Treatment/Interventions ADLs/Self Care Home Management;Therapeutic exercise;Patient/family education;Manual lymph drainage;Taping    PT Next Visit Plan Consider D/C at next session; cont and review Rt breast MLD assessing pts (husbands prn) technique and scar release until ind with self care    PT Home Exercise Plan Self MLD daily and wear compression bra daily    Consulted and Agree with Plan of Care Patient           Patient will benefit from skilled therapeutic  intervention in order to improve the following deficits and impairments:  Decreased knowledge of use of DME,Increased edema,Decreased skin integrity  Visit Diagnosis: Malignant neoplasm of lower-outer quadrant of right breast of female, estrogen receptor positive ([place])  Lymphedema, not elsewhere classified  Disorder of the skin and subcutaneous tissue related to radiation, unspecified     Problem List Patient Active Problem List  Diagnosis Date Noted  . Closed left hip fracture, initial encounter ([place]) 11/25/2020  . Malignant neoplasm of lower-outer quadrant of right breast of female, estrogen receptor positive ([place]) 03/12/2020  . Abnormal computed tomography angiography (CTA) 04/08/2016  . Abnormal stress test 04/02/2016  . Cardiac ischemia 04/02/2016  . Pure hypercholesterolemia 03/11/2016  . Abdominal distension (gaseous) 03/11/2016  . Abdominal pain, epigastric 03/11/2016  . Environmental and seasonal allergies 03/11/2016  . Anal or rectal pain 03/11/2016  . Dyspepsia 03/11/2016  . Other abnormal glucose 03/11/2016  . Essential (primary) hypertension 03/11/2016  . Cardiac arrhythmia 03/11/2016  . Irritable bowel syndrome 03/11/2016  . Osteoarthritis of knee 03/11/2016  . Osteopenia 03/11/2016  . Pain in joint, lower leg 03/11/2016  . History of colonic polyps 03/11/2016  . Slow transit constipation 03/11/2016  . PVC (premature ventricular contraction) 03/11/2016  . SOB (shortness of breath) 03/11/2016  . 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 Hemorrhoid 12/07/2010    Otelia Limes , PTA 03/18/2021, 12:51 PM  [place], [place], [address] Phone: [phone]   Fax:  [phone]  Name: ARELLY WHITTENBERG MRN: [id number] Date of Birth: 1941/07/30

## 2021-03-19 DIAGNOSIS — R269 Unspecified abnormalities of gait and mobility: Secondary | ICD-10-CM | POA: Diagnosis not present

## 2021-03-19 DIAGNOSIS — M545 Low back pain, unspecified: Secondary | ICD-10-CM | POA: Diagnosis not present

## 2021-03-19 DIAGNOSIS — M25552 Pain in left hip: Secondary | ICD-10-CM | POA: Diagnosis not present

## 2021-03-23 DIAGNOSIS — R609 Edema, unspecified: Secondary | ICD-10-CM | POA: Diagnosis not present

## 2021-03-23 DIAGNOSIS — R269 Unspecified abnormalities of gait and mobility: Secondary | ICD-10-CM | POA: Diagnosis not present

## 2021-03-23 DIAGNOSIS — M545 Low back pain, unspecified: Secondary | ICD-10-CM | POA: Diagnosis not present

## 2021-03-23 DIAGNOSIS — L03119 Cellulitis of unspecified part of limb: Secondary | ICD-10-CM | POA: Diagnosis not present

## 2021-03-23 DIAGNOSIS — M25552 Pain in left hip: Secondary | ICD-10-CM | POA: Diagnosis not present

## 2021-03-26 DIAGNOSIS — R269 Unspecified abnormalities of gait and mobility: Secondary | ICD-10-CM | POA: Diagnosis not present

## 2021-03-26 DIAGNOSIS — M545 Low back pain, unspecified: Secondary | ICD-10-CM | POA: Diagnosis not present

## 2021-03-26 DIAGNOSIS — M25552 Pain in left hip: Secondary | ICD-10-CM | POA: Diagnosis not present

## 2021-03-30 DIAGNOSIS — M545 Low back pain, unspecified: Secondary | ICD-10-CM | POA: Diagnosis not present

## 2021-03-30 DIAGNOSIS — M25552 Pain in left hip: Secondary | ICD-10-CM | POA: Diagnosis not present

## 2021-03-30 DIAGNOSIS — R269 Unspecified abnormalities of gait and mobility: Secondary | ICD-10-CM | POA: Diagnosis not present

## 2021-04-02 DIAGNOSIS — M25552 Pain in left hip: Secondary | ICD-10-CM | POA: Diagnosis not present

## 2021-04-02 DIAGNOSIS — M545 Low back pain, unspecified: Secondary | ICD-10-CM | POA: Diagnosis not present

## 2021-04-02 DIAGNOSIS — R269 Unspecified abnormalities of gait and mobility: Secondary | ICD-10-CM | POA: Diagnosis not present

## 2021-04-06 DIAGNOSIS — M545 Low back pain, unspecified: Secondary | ICD-10-CM | POA: Diagnosis not present

## 2021-04-06 DIAGNOSIS — R269 Unspecified abnormalities of gait and mobility: Secondary | ICD-10-CM | POA: Diagnosis not present

## 2021-04-06 DIAGNOSIS — M25552 Pain in left hip: Secondary | ICD-10-CM | POA: Diagnosis not present

## 2021-04-08 DIAGNOSIS — R269 Unspecified abnormalities of gait and mobility: Secondary | ICD-10-CM | POA: Diagnosis not present

## 2021-04-08 DIAGNOSIS — M25552 Pain in left hip: Secondary | ICD-10-CM | POA: Diagnosis not present

## 2021-04-08 DIAGNOSIS — M545 Low back pain, unspecified: Secondary | ICD-10-CM | POA: Diagnosis not present

## 2021-04-09 NOTE — Assessment & Plan Note (Signed)
02/15/2020:Screening mammogram detected right breast mass. 0.4cm mass at the 6:30 position and no axillary adenopathy. Biopsy: invasive ductal carcinoma, grade 3, HER-2 negative (1+), ER+ 95%, PR- 0%, KI67 40%. T1 a N0 stage Ia clinical stage  03/20/2020:Right lumpectomy [name]): invasive and in situ ductal carcinoma, 0.5cm, clear margins, 4 right axillary lymph nodes negativeT1 a N0 stage Ia pathologic stage 04/22/2020-05/12/2020: Adjuvant radiation With radiation she felt slightly depressed to the point that she was crying.  She improved with the counseling and support of her husband.  Treatment plan: Adjuvant antiestrogen therapywith anastrozole 1 mg daily x5 years to start 06/02/2020  Anastrozole toxicities:  Breast cancer surveillance: 1.  Breast exam 04/10/2021: Benign 2. mammogram 02/06/2021: Benign breast density category B  Return to clinic in 1 year for follow-up

## 2021-04-09 NOTE — Progress Notes (Signed)
Patient Care Team: Daisy Floro, MD as PCP - General (Family Medicine) Lars Masson, MD (Inactive) as PCP - Cardiology (Cardiology) Serena Croissant, MD as Consulting Physician (Hematology and Oncology) Griselda Miner, MD as Consulting Physician (General Surgery) Dorothy Puffer, MD as Consulting Physician (Radiation Oncology)  DIAGNOSIS:    ICD-10-CM   1. Malignant neoplasm of lower-outer quadrant of right breast of female, estrogen receptor positive (HCC)  C50.511    Z17.0       SUMMARY OF ONCOLOGIC HISTORY: Oncology History  Malignant neoplasm of lower-outer quadrant of right breast of female, estrogen receptor positive (HCC)  02/15/2020 Initial Diagnosis   Screening mammogram detected right breast mass. 0.4cm mass at the 6:30 position and no axillary adenopathy. Biopsy: invasive ductal carcinoma, grade 3, HER-2 negative (1+), ER+ 95%, PR- 0%, KI67 40%.   03/20/2020 Surgery   Right lumpectomy Carolynne Edouard) [id number]): invasive and in situ ductal carcinoma, 0.5cm, clear margins, 4 right axillary lymph nodes negative    04/21/2020 - 05/16/2020 Radiation Therapy   The patient initially received a dose of 42.56 Gy in 16 fractions to the breast using whole-breast tangent fields. This was delivered using a 3-D conformal technique. The patient then received a boost to the seroma. This delivered an additional 8 Gy in 4fractions using a 3 field photon technique due to the depth of the seroma. The total dose was 50.56 Gy.   05/2020 - 05/2025 Anti-estrogen oral therapy   Anastrozole     CHIEF COMPLIANT: Follow-up of right breast cancer on anastrozole  INTERVAL HISTORY: Andrea Farmer is a 80 y.o. with above-mentioned history of above-mentioned history of right breast cancer who underwent a right lumpectomy and radiation treatment, and is currently on anti-estrogen therapy. Mammogram on 02/06/21 showed postsurgical changes in the right breast and no evidence of malignancy bilaterally. She  reports to the clinic today to discuss survivorship care plan.  She has developed right breast lymphedema and is seeing physical therapy for that.  They are massaging her breast and they are giving her compression garments.  She also fractured her left hip in February 2022.  She is still getting physical therapy for that.  She has bilateral lower extremity edema.  ALLERGIES:  is allergic to tramadol.  MEDICATIONS:  Current Outpatient Medications  Medication Sig Dispense Refill   acetaminophen (TYLENOL) 500 MG tablet Take 1,000 mg by mouth every 6 (six) hours as needed for moderate pain.     amLODipine-benazepril (LOTREL) 5-40 MG capsule Take 1 capsule by mouth daily.     anastrozole (ARIMIDEX) 1 MG tablet TAKE 1 TABLET BY MOUTH EVERY DAY 90 tablet 2   aspirin EC 81 MG tablet Take 1 tablet (81 mg total) by mouth every other day. Swallow whole. 90 tablet 3   Calcium Carbonate-Vitamin D 600-400 MG-UNIT tablet Take 1 tablet by mouth 3 (three) times a week.      Coenzyme Q10 (CO Q 10 PO) Take 200 mg by mouth every other day.      ezetimibe (ZETIA) 10 MG tablet Take 1 tablet (10 mg total) by mouth daily. 90 tablet 2   furosemide (LASIX) 20 MG tablet Take 1 tablet (20 mg total) by mouth daily. May take an additional dose up to 3 times per week as needed for worsening swelling. 45 tablet 1   hydrochlorothiazide (HYDRODIURIL) 25 MG tablet Take 25 mg by mouth daily.     Hyprom-Naphaz-Polysorb-Zn Sulf (CLEAR EYES COMPLETE OP) Place 1 drop into both eyes daily  as needed (dry eyes).     metFORMIN (GLUCOPHAGE) 500 MG tablet Take 500 mg by mouth daily.     mometasone (NASONEX) 50 MCG/ACT nasal spray Place 2 sprays into the nose 3 (three) times a week.     Multiple Vitamin (MULTIVITAMIN WITH MINERALS) TABS tablet Take 1 tablet by mouth 3 (three) times a week.      Omega-3 Fatty Acids (OMEGA 3 PO) Take 1 capsule by mouth 3 (three) times a week.      polyethylene glycol (MIRALAX / GLYCOLAX) 17 g packet Take 17 g  by mouth daily.     potassium chloride SA (KLOR-CON) 20 MEQ tablet Take 1 tablet (20 mEq total) by mouth daily. 30 tablet 0   Probiotic Product (ALIGN) 4 MG CAPS Take 4 mg by mouth daily.     psyllium (HYDROCIL/METAMUCIL) 95 % PACK Take 1 packet by mouth daily.      rosuvastatin (CRESTOR) 40 MG tablet Take 1 tablet (40 mg total) by mouth daily. 90 tablet 2   TURMERIC PO Take 500 mg by mouth every other day.     vitamin E 400 UNIT capsule Take 400 Units by mouth every other day.     No current facility-administered medications for this visit.    PHYSICAL EXAMINATION: ECOG PERFORMANCE STATUS: 1 - Symptomatic but completely ambulatory  Vitals:   04/10/21 0958  BP: (!) 151/83  Pulse: 66  Resp: 18  Temp: 97.8 F (36.6 C)  SpO2: 99%   Filed Weights   04/10/21 0958  Weight: 180 lb 3.2 oz (81.7 kg)    BREAST: Right breast lymphedema. (exam performed in the presence of a chaperone)  LABORATORY DATA:  I have reviewed the data as listed CMP Latest Ref Rng & Units 02/24/2021 11/29/2020 11/28/2020  Glucose 65 - 99 mg/dL 108(H) 108(H) 137(H)  BUN 8 - 27 mg/dL $Remove'13 16 23  'fUIiVXq$ Creatinine 0.57 - 1.00 mg/dL 0.60 0.55 0.68  Sodium 134 - 144 mmol/L 141 137 136  Potassium 3.5 - 5.2 mmol/L 4.3 4.0 4.2  Chloride 96 - 106 mmol/L 102 101 100  CO2 20 - 29 mmol/L $RemoveB'21 28 29  'EslctISY$ Calcium 8.7 - 10.3 mg/dL 10.7(H) 8.4(L) 8.6(L)  Total Protein 6.5 - 8.1 g/dL - - -  Total Bilirubin 0.3 - 1.2 mg/dL - - -  Alkaline Phos 38 - 126 U/L - - -  AST 15 - 41 U/L - - -  ALT 0 - 44 U/L - - -    Lab Results  Component Value Date   WBC 6.6 11/29/2020   HGB 8.9 (L) 11/29/2020   HCT 27.8 (L) 11/29/2020   MCV 95.2 11/29/2020   PLT 190 11/29/2020   NEUTROABS 8.0 (H) 11/25/2020    ASSESSMENT & PLAN:  Malignant neoplasm of lower-outer quadrant of right breast of female, estrogen receptor positive ([place]) 02/15/2020:Screening mammogram detected right breast mass. 0.4cm mass at the 6:30 position and no axillary adenopathy.  Biopsy: invasive ductal carcinoma, grade 3, HER-2 negative (1+), ER+ 95%, PR- 0%, KI67 40%. T1 a N0 stage Ia clinical stage   03/20/2020:Right lumpectomy [name]): invasive and in situ ductal carcinoma, 0.5cm, clear margins, 4 right axillary lymph nodes negative T1 a N0 stage Ia pathologic stage 04/22/2020-05/12/2020: Adjuvant radiation With radiation she felt slightly depressed to the point that she was crying.  She improved with the counseling and support of her husband.   Treatment plan: Adjuvant antiestrogen therapy with anastrozole 1 mg daily x5 years to start 06/02/2020  Anastrozole toxicities: Denies any adverse effects. Fracture of the left hip February 2022: Status post surgery.  We will get another bone density and November 2022.  Breast cancer surveillance: 1.  Breast exam 04/10/2021: Benign, right breast lymphedema 2. mammogram 02/06/2021: Benign breast density category B  Return to clinic in 1 year for follow-up    No orders of the defined types were placed in this encounter.  The patient has a good understanding of the overall plan. she agrees with it. she will call with any problems that may develop before the next visit here.  Total time spent: 20 mins including face to face time and time spent for planning, charting and coordination of care  Rulon Eisenmenger, MD, MPH 04/10/2021  I, Thana Ates, am acting as scribe for Dr. Nicholas Lose.  I have reviewed the above documentation for accuracy and completeness, and I agree with the above.

## 2021-04-10 ENCOUNTER — Inpatient Hospital Stay: Payer: PPO | Attending: Hematology and Oncology | Admitting: Hematology and Oncology

## 2021-04-10 ENCOUNTER — Other Ambulatory Visit: Payer: Self-pay

## 2021-04-10 VITALS — BP 151/83 | HR 66 | Temp 97.8°F | Resp 18 | Ht 64.0 in | Wt 180.2 lb

## 2021-04-10 DIAGNOSIS — C50511 Malignant neoplasm of lower-outer quadrant of right female breast: Secondary | ICD-10-CM

## 2021-04-10 DIAGNOSIS — Z79899 Other long term (current) drug therapy: Secondary | ICD-10-CM | POA: Insufficient documentation

## 2021-04-10 DIAGNOSIS — M8588 Other specified disorders of bone density and structure, other site: Secondary | ICD-10-CM

## 2021-04-10 DIAGNOSIS — Z79811 Long term (current) use of aromatase inhibitors: Secondary | ICD-10-CM | POA: Insufficient documentation

## 2021-04-10 DIAGNOSIS — Z170 Estrogen receptor positive status [ER+]: Secondary | ICD-10-CM | POA: Diagnosis not present

## 2021-04-10 DIAGNOSIS — Z923 Personal history of irradiation: Secondary | ICD-10-CM | POA: Insufficient documentation

## 2021-04-15 ENCOUNTER — Other Ambulatory Visit: Payer: Self-pay

## 2021-04-15 ENCOUNTER — Ambulatory Visit: Payer: PPO | Attending: General Surgery

## 2021-04-15 DIAGNOSIS — R269 Unspecified abnormalities of gait and mobility: Secondary | ICD-10-CM | POA: Diagnosis not present

## 2021-04-15 DIAGNOSIS — M25552 Pain in left hip: Secondary | ICD-10-CM | POA: Diagnosis not present

## 2021-04-15 DIAGNOSIS — M545 Low back pain, unspecified: Secondary | ICD-10-CM | POA: Diagnosis not present

## 2021-04-15 DIAGNOSIS — C50511 Malignant neoplasm of lower-outer quadrant of right female breast: Secondary | ICD-10-CM | POA: Diagnosis not present

## 2021-04-15 DIAGNOSIS — L599 Disorder of the skin and subcutaneous tissue related to radiation, unspecified: Secondary | ICD-10-CM | POA: Diagnosis not present

## 2021-04-15 DIAGNOSIS — Z170 Estrogen receptor positive status [ER+]: Secondary | ICD-10-CM | POA: Diagnosis not present

## 2021-04-15 DIAGNOSIS — I890 Lymphedema, not elsewhere classified: Secondary | ICD-10-CM | POA: Insufficient documentation

## 2021-04-15 NOTE — Therapy (Signed)
[place], [place], [address] Phone: [phone]   Fax:  [phone]  Physical Therapy Treatment  Patient Details  Name: Andrea Farmer MRN: [id number] Date of Birth: May 20, 1941 Referring Provider (PT): Dr. [name]   Encounter Date: 04/15/2021   PT End of Session - 04/15/21 1214     Visit Number 4    Number of Visits 5    Date for PT Re-Evaluation 04/16/21    PT Start Time 1106    PT Stop Time 1208    PT Time Calculation (min) 62 min    Activity Tolerance Patient tolerated treatment well    Behavior During Therapy [facility] for tasks assessed/performed             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   Pneumonia    30 plus yrs ago    Past Surgical History:  Procedure Laterality Date   ABDOMINAL HYSTERECTOMY     BREAST BIOPSY Right 05/14/2015   BREAST EXCISIONAL BIOPSY Left    BREAST EXCISIONAL BIOPSY Right    BREAST LUMPECTOMY WITH RADIOACTIVE SEED AND SENTINEL LYMPH NODE BIOPSY Right 03/20/2020   Procedure: RIGHT BREAST LUMPECTOMY WITH RADIOACTIVE SEED AND SENTINEL LYMPH NODE BIOPSY;  Surgeon: Jovita Kussmaul, MD;  Location: [place];  Service: General;  Laterality: Right;   BREAST LUMPECTOMY WITH RADIOACTIVE SEED LOCALIZATION Right 04/27/2019   Procedure: RIGHT BREAST LUMPECTOMY WITH RADIOACTIVE SEED LOCALIZATION;  Surgeon: Jovita Kussmaul, MD;  Location: [place];  Service: General;  Laterality: Right;   INTRAMEDULLARY (IM) NAIL INTERTROCHANTERIC Left 11/26/2020    Procedure: INTRAMEDULLARY (IM) NAIL INTERTROCHANTRIC;  Surgeon: Paralee Cancel, MD;  [facility];  Service: Orthopedics;  Laterality: Left;   RECTAL SURGERY     x 3   REPLACEMENT TOTAL KNEE BILATERAL     SYNOVECTOMY Left 07/21/2015   Procedure: LEFT KNEE SYNOVECTOMY with POLY EXCHANGE;  Surgeon: Vickey Huger, MD;  Location: [place];  Service: Orthopedics;  Laterality: Left;   TOTAL KNEE REVISION Right 07/05/2016   Procedure: TOTAL KNEE REVISION WITH SCAR DEBRIDEMENT/PATELLA REVISION WITH POLY EXCHANGE;  Surgeon: Vickey Huger, MD;  Location: [place];  Service: Orthopedics;  Laterality: Right;    There were no vitals filed for this visit.   Subjective Assessment - 04/15/21 1112     Subjective I feel like I'm doing really well. My Rt breast is much softer than it was and I feel like I'm managing well.    Pertinent History Rt lumpectomy 03/20/20 by Dr. 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 No/denies  [facility] Adult PT Treatment/Exercise - 04/15/21 0001       Manual Therapy   Edema Management Issued 1/2" gray foam in tubular stockinette for pt to wear at the inferior to lateral aspect of her breast where fibrosis most palpable    Manual Lymphatic Drainage (MLD) In Supine: short neck, superfical and deep abdominals, Rt inguinal and Lt axillary nodes, Rt axillo-inguinal and anterior inter-axillary anastomosis, then focused on Rt breast, then into partial Lt S/L with pillow under glut due to reent Lt hip surgery for further work to Rt lateral breast redirecting towards Rt axillo-inguinal and posterior inter-axillary anastomosis, finished retracing all steps in supine                         PT Long Term Goals - 04/15/21 1109       PT LONG  TERM GOAL #1   Title Pt will be ind with self skin care to include scar massage and breast MLD    Baseline Pt is independent with scar massage but she did benefit from further review of self MLD to better focus on the lateral/inferior aspect of the breast-04/15/21    Status Partially Met      PT LONG TERM GOAL #2   Title Pt will obtain appropriate compression bra with insert    Baseline Pt has a compression bra she wears daily and Medi peach foam that she wears at area of fibrosis-04/15/21    Status Achieved      PT LONG TERM GOAL #3   Title Pt will report decreased breast heaviness by 50%    Baseline Pt reports 90% improvement at this time-04/15/21    Status Achieved                   Plan - 04/15/21 1214     Clinical Impression Statement Pt returns after working on self managing her lymphedema in her Rt breast at home. She has ben performing self manual lymph drainage daily along with wearing her compression bra daily as well with compression foam insert in bra at area of fibrosis. Her fibrosis, though some improved is still present at inferior to lateral aspect and this is most palpable when pt is in Lt S/L. Reviewed this with her todya and instructed her that it would be best fo rher to spend more time in this position to work on lateral aspect as this responds very well to the MLD during session today with good softening noted by pt and therapist. Despite 4 weeks of her wearing her compression bra daily and due to the continued presence of fibrosis and radiation fibrosis with hyperpigmentation at her Rt breast, pt would benefit from a multi-chambered pneumatic compression pump to further assist her in working to manage her chronic Stage 2 lymphedema as significant symptoms still persist. Pt is agreeable to this so, with her permission, her demographics will be sent to [employer] to begin this process.    Personal Factors and Comorbidities Age;Comorbidity 2    Comorbidities SLNB,  radiation history    Examination-Activity Limitations Other   none   Examination-Participation Restrictions --   none   Stability/Clinical Decision Making Stable/Uncomplicated    Rehab Potential Excellent    PT Frequency 1x / week    PT Duration 8 weeks    PT Treatment/Interventions ADLs/Self Care Home Management;Therapeutic exercise;Patient/family education;Manual lymph drainage;Taping    PT Next Visit Plan Consider D/C at next session; cont and review Rt  breast MLD assessing pts technique and scar release until ind with self care; did she hear from [place]?    PT Home Exercise Plan Self MLD daily and wear compression bra daily    Recommended Other Services [employer] compression pump    Consulted and Agree with Plan of Care Patient             Patient will benefit from skilled therapeutic intervention in order to improve the following deficits and impairments:  Decreased knowledge of use of DME, Increased edema, Decreased skin integrity  Visit Diagnosis: Malignant neoplasm of lower-outer quadrant of right breast of female, estrogen receptor positive ([place])  Lymphedema, not elsewhere classified  Disorder of the skin and subcutaneous tissue related to radiation, unspecified     Problem List Patient Active Problem List   Diagnosis Date Noted   Closed left hip fracture, initial encounter ([place]) 11/25/2020   Malignant neoplasm of lower-outer quadrant of right breast of female, estrogen receptor positive ([place]) 03/12/2020   Abnormal computed tomography angiography (CTA) 04/08/2016   Abnormal stress test 04/02/2016   Cardiac ischemia 04/02/2016   Pure hypercholesterolemia 03/11/2016   Abdominal distension (gaseous) 03/11/2016   Abdominal pain, epigastric 03/11/2016   Environmental and seasonal allergies 03/11/2016   Anal or rectal pain 03/11/2016   Dyspepsia 03/11/2016   Other abnormal glucose 03/11/2016   Essential (primary) hypertension 03/11/2016   Cardiac arrhythmia  03/11/2016   Irritable bowel syndrome 03/11/2016   Osteoarthritis of knee 03/11/2016   Osteopenia 03/11/2016   Pain in joint, lower leg 03/11/2016   History of colonic polyps 03/11/2016   Slow transit constipation 03/11/2016   PVC (premature ventricular contraction) 03/11/2016   SOB (shortness of breath) 03/11/2016   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/06/2015   Hemorrhoid 12/07/2010    Otelia Limes, PTA 04/15/2021, 12:27 PM  [place], [place], [address] Phone: [phone]   Fax:  [phone]  Name: HULDAH MARIN MRN: [id number] Date of Birth: June 09, 1941

## 2021-04-17 DIAGNOSIS — M545 Low back pain, unspecified: Secondary | ICD-10-CM | POA: Diagnosis not present

## 2021-04-17 DIAGNOSIS — M25552 Pain in left hip: Secondary | ICD-10-CM | POA: Diagnosis not present

## 2021-04-17 DIAGNOSIS — R269 Unspecified abnormalities of gait and mobility: Secondary | ICD-10-CM | POA: Diagnosis not present

## 2021-04-18 ENCOUNTER — Encounter (HOSPITAL_COMMUNITY): Payer: Self-pay

## 2021-04-21 DIAGNOSIS — M25552 Pain in left hip: Secondary | ICD-10-CM | POA: Diagnosis not present

## 2021-04-21 DIAGNOSIS — R269 Unspecified abnormalities of gait and mobility: Secondary | ICD-10-CM | POA: Diagnosis not present

## 2021-04-21 DIAGNOSIS — M545 Low back pain, unspecified: Secondary | ICD-10-CM | POA: Diagnosis not present

## 2021-04-22 ENCOUNTER — Other Ambulatory Visit: Payer: Self-pay

## 2021-04-22 DIAGNOSIS — E782 Mixed hyperlipidemia: Secondary | ICD-10-CM

## 2021-04-22 DIAGNOSIS — E7800 Pure hypercholesterolemia, unspecified: Secondary | ICD-10-CM

## 2021-04-22 DIAGNOSIS — Z79899 Other long term (current) drug therapy: Secondary | ICD-10-CM

## 2021-04-22 DIAGNOSIS — I2510 Atherosclerotic heart disease of native coronary artery without angina pectoris: Secondary | ICD-10-CM

## 2021-04-22 DIAGNOSIS — I10 Essential (primary) hypertension: Secondary | ICD-10-CM

## 2021-04-22 MED ORDER — POTASSIUM CHLORIDE CRYS ER 20 MEQ PO TBCR: 20.0000 meq | EXTENDED_RELEASE_TABLET | Freq: Every day | ORAL | 3 refills | Status: DC

## 2021-04-23 DIAGNOSIS — M25552 Pain in left hip: Secondary | ICD-10-CM | POA: Diagnosis not present

## 2021-04-23 DIAGNOSIS — R269 Unspecified abnormalities of gait and mobility: Secondary | ICD-10-CM | POA: Diagnosis not present

## 2021-04-23 DIAGNOSIS — M545 Low back pain, unspecified: Secondary | ICD-10-CM | POA: Diagnosis not present

## 2021-04-24 DIAGNOSIS — R609 Edema, unspecified: Secondary | ICD-10-CM | POA: Diagnosis not present

## 2021-04-24 DIAGNOSIS — Z79899 Other long term (current) drug therapy: Secondary | ICD-10-CM | POA: Diagnosis not present

## 2021-04-27 DIAGNOSIS — M545 Low back pain, unspecified: Secondary | ICD-10-CM | POA: Diagnosis not present

## 2021-04-27 DIAGNOSIS — R269 Unspecified abnormalities of gait and mobility: Secondary | ICD-10-CM | POA: Diagnosis not present

## 2021-04-27 DIAGNOSIS — M25552 Pain in left hip: Secondary | ICD-10-CM | POA: Diagnosis not present

## 2021-04-29 DIAGNOSIS — M545 Low back pain, unspecified: Secondary | ICD-10-CM | POA: Diagnosis not present

## 2021-04-29 DIAGNOSIS — M25552 Pain in left hip: Secondary | ICD-10-CM | POA: Diagnosis not present

## 2021-04-29 DIAGNOSIS — R269 Unspecified abnormalities of gait and mobility: Secondary | ICD-10-CM | POA: Diagnosis not present

## 2021-05-04 DIAGNOSIS — M25552 Pain in left hip: Secondary | ICD-10-CM | POA: Diagnosis not present

## 2021-05-04 DIAGNOSIS — R269 Unspecified abnormalities of gait and mobility: Secondary | ICD-10-CM | POA: Diagnosis not present

## 2021-05-04 DIAGNOSIS — M545 Low back pain, unspecified: Secondary | ICD-10-CM | POA: Diagnosis not present

## 2021-05-06 ENCOUNTER — Other Ambulatory Visit: Payer: Self-pay

## 2021-05-06 ENCOUNTER — Ambulatory Visit: Payer: PPO

## 2021-05-06 DIAGNOSIS — M545 Low back pain, unspecified: Secondary | ICD-10-CM | POA: Diagnosis not present

## 2021-05-06 DIAGNOSIS — C50511 Malignant neoplasm of lower-outer quadrant of right female breast: Secondary | ICD-10-CM | POA: Diagnosis not present

## 2021-05-06 DIAGNOSIS — L599 Disorder of the skin and subcutaneous tissue related to radiation, unspecified: Secondary | ICD-10-CM

## 2021-05-06 DIAGNOSIS — R269 Unspecified abnormalities of gait and mobility: Secondary | ICD-10-CM | POA: Diagnosis not present

## 2021-05-06 DIAGNOSIS — M25552 Pain in left hip: Secondary | ICD-10-CM | POA: Diagnosis not present

## 2021-05-06 DIAGNOSIS — I890 Lymphedema, not elsewhere classified: Secondary | ICD-10-CM

## 2021-05-06 NOTE — Therapy (Signed)
[place], [place], [address] Phone: [phone]   Fax:  [phone]  Physical Therapy Treatment  Patient Details  Name: Andrea Farmer MRN: [id number] Date of Birth: 11-Mar-1941 Referring Provider (PT): Dr. [name]   Encounter Date: 05/06/2021   PT End of Session - 05/06/21 1020     Visit Number 5    Number of Visits 5    Date for PT Re-Evaluation 04/16/21   D/C this visit   PT Start Time 0904    PT Stop Time 1012    PT Time Calculation (min) 68 min    Activity Tolerance Patient tolerated treatment well    Behavior During Therapy [facility] for tasks assessed/performed             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   Pneumonia    30 plus yrs ago    Past Surgical History:  Procedure Laterality Date   ABDOMINAL HYSTERECTOMY     BREAST BIOPSY Right 05/14/2015   BREAST EXCISIONAL BIOPSY Left    BREAST EXCISIONAL BIOPSY Right    BREAST LUMPECTOMY WITH RADIOACTIVE SEED AND SENTINEL LYMPH NODE BIOPSY Right 03/20/2020   Procedure: RIGHT BREAST LUMPECTOMY WITH RADIOACTIVE SEED AND SENTINEL LYMPH NODE BIOPSY;  Surgeon: Jovita Kussmaul, MD;  Location: [place];  Service: General;  Laterality: Right;   BREAST LUMPECTOMY WITH RADIOACTIVE SEED LOCALIZATION Right 04/27/2019   Procedure: RIGHT BREAST LUMPECTOMY WITH RADIOACTIVE SEED LOCALIZATION;  Surgeon: Jovita Kussmaul, MD;  Location: [place];  Service: General;  Laterality: Right;   INTRAMEDULLARY (IM) NAIL INTERTROCHANTERIC Left  11/26/2020   Procedure: INTRAMEDULLARY (IM) NAIL INTERTROCHANTRIC;  Surgeon: Paralee Cancel, MD;  Location: [facility];  Service: Orthopedics;  Laterality: Left;   RECTAL SURGERY     x 3   REPLACEMENT TOTAL KNEE BILATERAL     SYNOVECTOMY Left 07/21/2015   Procedure: LEFT KNEE SYNOVECTOMY with POLY EXCHANGE;  Surgeon: Vickey Huger, MD;  Location: [place];  Service: Orthopedics;  Laterality: Left;   TOTAL KNEE REVISION Right 07/05/2016   Procedure: TOTAL KNEE REVISION WITH SCAR DEBRIDEMENT/PATELLA REVISION WITH POLY EXCHANGE;  Surgeon: Vickey Huger, MD;  Location: [facility];  Service: Orthopedics;  Laterality: Right;    There were no vitals filed for this visit.   Subjective Assessment - 05/06/21 0923     Subjective I met with Andrea Farmer from [place] and really liked the pump so told him to go ahead with ordering that.    Pertinent History Rt lumpectomy 03/20/20 by Dr. 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 No/denies  [facility] PT Treatment/Exercise - 05/06/21 0001       Manual Therapy   Edema Management Adjusted gray foam issued at last session as the tubular stockinette was bunching up on her breast and causing indentations on her skin affecting her lymphedema; instead placed the 1/2" gray foam and peach medi with small dots in 1 piece of TG soft and pt reports this more comfortable and it was a good fit and won't bunch on pts skin    Manual Lymphatic Drainage (MLD) In Supine: short neck, superfical and deep abdominals, Rt inguinal and Lt axillary nodes, Rt axillo-inguinal and anterior inter-axillary anastomosis, then focused on Rt breast, then into partial Lt S/L with purple ball under glut due to discomfort from recent Lt hip fx to allow for further work to Rt  lateral breast redirecting towards Rt axillo-inguinal and posterior inter-axillary anastomosis and spent time reviewing technique an dpressure with pt using hand over hand technique, then finished retracing all steps in supine                         PT Long Term Goals - 04/15/21 1109       PT LONG TERM GOAL #1   Title Pt will be ind with self skin care to include scar massage and breast MLD    Baseline Pt is independent with scar massage but she did benefit from further review of self MLD to better focus on the lateral/inferior aspect of the breast-04/15/21    Status Partially Met      PT LONG TERM GOAL #2   Title Pt will obtain appropriate compression bra with insert    Baseline Pt has a compression bra she wears daily and Medi peach foam that she wears at area of fibrosis-04/15/21    Status Achieved      PT LONG TERM GOAL #3   Title Pt will report decreased breast heaviness by 50%    Baseline Pt reports 90% improvement at this time-04/15/21    Status Achieved                   Plan - 05/06/21 1021     Clinical Impression Statement Pt has focuse don self managing her Rt breast lymphedema since she was here last. She has been performing her self manual lymph drainage daily and wearing her compression daily as well with peach medi foam and 1/2" gray foam. She has what appears to be a stretch mark at the lateral aspect of her breast where fluid gathers, feeling firmer here, and she also had an indentation from a wrinkle from the tubular stockinette here today making feel even firmer. This softened dramatically by end of MLD today along with the fibrosis at the lateral aspect of her breast. Focused on instructing pt on correct direction of stretch and pressure with hand over hand technique and she was able to return good demo by end of session. Also exchacnged the tubular stockinette for TG soft combining the peach medi foam as well and this was a much snugger fit in the TG  soft so pt will not have any moreissues with wrinkles and indetations on hre skin from this. She also met with [employer] rep Andrea Farmer and has decided to get a compression pump as well to help her better self manage her lymphedema symptoms. She will be D/C at this time and knows she can call with any follow up questions she may have. Andrea Farmer  was a pleasure to work with and is motivated to get better.    Personal Factors and Comorbidities Age;Comorbidity 2    Comorbidities SLNB, radiation history    Stability/Clinical Decision Making Stable/Uncomplicated    Rehab Potential Excellent    PT Frequency 1x / week    PT Duration 8 weeks    PT Treatment/Interventions ADLs/Self Care Home Management;Therapeutic exercise;Patient/family education;Manual lymph drainage;Taping    PT Next Visit Plan D/C this visit.    PT Home Exercise Plan Self MLD daily and wear compression bra daily    Consulted and Agree with Plan of Care Patient             Patient will benefit from skilled therapeutic intervention in order to improve the following deficits and impairments:  Decreased knowledge of use of DME, Increased edema, Decreased skin integrity  Visit Diagnosis: Malignant neoplasm of lower-outer quadrant of right breast of female, estrogen receptor positive ([place])  Lymphedema, not elsewhere classified  Disorder of the skin and subcutaneous tissue related to radiation, unspecified     Problem List Patient Active Problem List   Diagnosis Date Noted   Closed left hip fracture, initial encounter ([place]) 11/25/2020   Malignant neoplasm of lower-outer quadrant of right breast of female, estrogen receptor positive ([place]) 03/12/2020   Abnormal computed tomography angiography (CTA) 04/08/2016   Abnormal stress test 04/02/2016   Cardiac ischemia 04/02/2016   Pure hypercholesterolemia 03/11/2016   Abdominal distension (gaseous) 03/11/2016   Abdominal pain, epigastric 03/11/2016   Environmental and seasonal  allergies 03/11/2016   Anal or rectal pain 03/11/2016   Dyspepsia 03/11/2016   Other abnormal glucose 03/11/2016   Essential (primary) hypertension 03/11/2016   Cardiac arrhythmia 03/11/2016   Irritable bowel syndrome 03/11/2016   Osteoarthritis of knee 03/11/2016   Osteopenia 03/11/2016   Pain in joint, lower leg 03/11/2016   History of colonic polyps 03/11/2016   Slow transit constipation 03/11/2016   PVC (premature ventricular contraction) 03/11/2016   SOB (shortness of breath) 03/11/2016   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

## 2021-05-08 DIAGNOSIS — I890 Lymphedema, not elsewhere classified: Secondary | ICD-10-CM | POA: Diagnosis not present

## 2021-05-25 ENCOUNTER — Ambulatory Visit: Payer: PPO | Admitting: Cardiology

## 2021-06-02 ENCOUNTER — Other Ambulatory Visit: Payer: Self-pay | Admitting: *Deleted

## 2021-06-02 DIAGNOSIS — I10 Essential (primary) hypertension: Secondary | ICD-10-CM

## 2021-06-02 DIAGNOSIS — E782 Mixed hyperlipidemia: Secondary | ICD-10-CM

## 2021-06-02 DIAGNOSIS — Z79899 Other long term (current) drug therapy: Secondary | ICD-10-CM

## 2021-06-02 DIAGNOSIS — I2510 Atherosclerotic heart disease of native coronary artery without angina pectoris: Secondary | ICD-10-CM

## 2021-06-02 DIAGNOSIS — E7800 Pure hypercholesterolemia, unspecified: Secondary | ICD-10-CM

## 2021-06-02 MED ORDER — FUROSEMIDE 20 MG PO TABS: 20.0000 mg | ORAL_TABLET | Freq: Every day | ORAL | 5 refills | Status: DC

## 2021-06-05 ENCOUNTER — Ambulatory Visit (HOSPITAL_BASED_OUTPATIENT_CLINIC_OR_DEPARTMENT_OTHER): Payer: PPO | Admitting: Family

## 2021-06-05 ENCOUNTER — Other Ambulatory Visit: Payer: Self-pay

## 2021-06-05 ENCOUNTER — Encounter (HOSPITAL_BASED_OUTPATIENT_CLINIC_OR_DEPARTMENT_OTHER): Payer: Self-pay | Admitting: Family

## 2021-06-05 VITALS — BP 126/76 | HR 73 | Resp 20 | Ht 64.0 in | Wt 173.0 lb

## 2021-06-05 DIAGNOSIS — I2510 Atherosclerotic heart disease of native coronary artery without angina pectoris: Secondary | ICD-10-CM | POA: Diagnosis not present

## 2021-06-05 DIAGNOSIS — I471 Supraventricular tachycardia, unspecified: Secondary | ICD-10-CM

## 2021-06-05 DIAGNOSIS — I77810 Thoracic aortic ectasia: Secondary | ICD-10-CM

## 2021-06-05 DIAGNOSIS — E785 Hyperlipidemia, unspecified: Secondary | ICD-10-CM | POA: Diagnosis not present

## 2021-06-05 DIAGNOSIS — I493 Ventricular premature depolarization: Secondary | ICD-10-CM | POA: Diagnosis not present

## 2021-06-05 DIAGNOSIS — I10 Essential (primary) hypertension: Secondary | ICD-10-CM

## 2021-06-05 MED ORDER — FUROSEMIDE 20 MG PO TABS: 20.0000 mg | ORAL_TABLET | ORAL | 1 refills | Status: DC | PRN

## 2021-06-05 NOTE — Progress Notes (Signed)
Office Visit    Patient Name: Andrea Farmer Date of Encounter: 06/05/2021  PCP:  Lawerance Cruel, MD   [facility]  Cardiologist:  Freada Bergeron, MD  Advanced Practice Provider:  No care team member to display Electrophysiologist:  None      Chief Complaint    Andrea Farmer is a 80 y.o. 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, palpitations with PVCs and short runs of NSVT/SVT on prior monitor, HTN, HLD, GERD presents today for follow-up  Past Medical History    Past Medical History:  Diagnosis Date   Acid reflux    Back pain    arthritis   Cataract    immature but doesn't know which eye   Diabetes mellitus without complication ([place])    High cholesterol    takes Crestor daily   History of colon polyps    benign   History of migraine    Hypertension    takes Lotrel and HCTZ dailydaily   Irritable bowel syndrome 03/11/2016   Joint pain    Joint swelling    Osteoarthritis of knee 03/11/2016   Pneumonia    30 plus yrs ago   Past Surgical History:  Procedure Laterality Date   ABDOMINAL HYSTERECTOMY     BREAST BIOPSY Right 05/14/2015   BREAST EXCISIONAL BIOPSY Left    BREAST EXCISIONAL BIOPSY Right    BREAST LUMPECTOMY WITH RADIOACTIVE SEED AND SENTINEL LYMPH NODE BIOPSY Right 03/20/2020   Procedure: RIGHT BREAST LUMPECTOMY WITH RADIOACTIVE SEED AND SENTINEL LYMPH NODE BIOPSY;  Surgeon: Jovita Kussmaul, MD;  Location: [facility];  Service: General;  Laterality: Right;   BREAST LUMPECTOMY WITH RADIOACTIVE SEED LOCALIZATION Right 04/27/2019   Procedure: RIGHT BREAST LUMPECTOMY WITH RADIOACTIVE SEED LOCALIZATION;  Surgeon: Jovita Kussmaul, MD;  Location: [facility];  Service: General;  Laterality: Right;   INTRAMEDULLARY (IM) NAIL INTERTROCHANTERIC Left 11/26/2020   Procedure: INTRAMEDULLARY (IM) NAIL INTERTROCHANTRIC;  Surgeon: Paralee Cancel, MD;  [facility];  Service:  Orthopedics;  Laterality: Left;   RECTAL SURGERY     x 3   REPLACEMENT TOTAL KNEE BILATERAL     SYNOVECTOMY Left 07/21/2015   Procedure: LEFT KNEE SYNOVECTOMY with POLY EXCHANGE;  Surgeon: Vickey Huger, MD;  Location: [facility];  Service: Orthopedics;  Laterality: Left;   TOTAL KNEE REVISION Right 07/05/2016   Procedure: TOTAL KNEE REVISION WITH SCAR DEBRIDEMENT/PATELLA REVISION WITH POLY EXCHANGE;  Surgeon: Vickey Huger, MD;  Location: [facility];  Service: Orthopedics;  Laterality: Right;    Allergies  Allergies  Allergen Reactions   Tramadol     Other reaction(s): itching    History of Present Illness    Andrea Farmer is a 80 y.o. 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 Johney Frame 02/17/2021.  Spring 2021 she noticed increasing palpitations and wore a monitor showing 11% PVC burden, multiple runs of atrial tachycardia, short runs of nonsustained VT.  Echocardiogram at that time with LVEF 60 to 65% with normal wall motion, grade 1 diastolic dysfunction, mild MR.  Repeat coronary CTA due to worsening PVCs showed moderate disease which was negative by CT FFR.  She was last seen in clinic by Dr. Johney Frame 02/17/2022 doing well from cardiac perspective.  She had recently underwent ORIF of  her left hip after a mechanical fall and was participating in PT.  She did note some bilateral lower extremity edema and was provided Lasix in place of hydrochlorothiazide.  She presents today for follow-up.  Tells me she has completed her physical therapy for her hip and is found a cane only when out in public but not in her home.  She is hopeful to continue to improve her mobility. Reports no shortness of breath nor dyspnea on exertion. Reports no chest pain, pressure, or tightness. No edema, orthopnea, PND. Reports no palpitations.    EKGs/Labs/Other Studies Reviewed:   The following  studies were reviewed today: TTE 2020/02/27  1. Left ventricular ejection fraction, by estimation, is 60 to 65%. The  left ventricle has normal function. The left ventricle has no regional  wall motion abnormalities. There is mild left ventricular hypertrophy.  Left ventricular diastolic parameters  are consistent with Grade I diastolic dysfunction (impaired relaxation).   2. Right ventricular systolic function is normal. The right ventricular  size is normal. There is normal pulmonary artery systolic pressure. The  estimated right ventricular systolic pressure is [id number] mmHg.   3. The mitral valve is abnormal. Mild mitral valve regurgitation.   4. The aortic valve is tricuspid. Aortic valve regurgitation is not  visualized.   5. Aortic dilatation noted. 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   6. The inferior vena cava is normal in size with greater than 50%  respiratory variability, suggesting right atrial pressure of 3 mmHg.    Coronary CTA 2020-03-05 25-49% RCA 25-49% left main Proximal LAD 50-69% Mid LAD 50-69% Minimal less than 25% left circumflex CT FFR negative    EKG:  No EKG today  Recent Labs: 11/25/2020: ALT 19 11/29/2020: Hemoglobin 8.9; Platelets 190 02/24/2021: BUN 13; Creatinine, Ser 0.60; Potassium 4.3; Sodium 141  Recent Lipid Panel    Component Value Date/Time   CHOL 140 02/17/2021 1210   CHOL 164 05/20/2016 1127   TRIG 96 02/17/2021 1210   TRIG 88 05/20/2016 1127   HDL 57 02/17/2021 1210   HDL 55 05/20/2016 1127   CHOLHDL 2.5 02/17/2021 1210   CHOLHDL 3.0 05/20/2016 1127   LDLCALC 65 02/17/2021 1210   LDLCALC 91 05/20/2016 1127    Risk Assessment/Calculations:    Home Medications   Current Meds  Medication Sig   acetaminophen (TYLENOL) 500 MG tablet Take 1,000 mg by mouth every 6 (six) hours as needed for moderate pain.   amLODipine-benazepril (LOTREL) 5-40 MG capsule Take 1 capsule by mouth daily.   anastrozole  (ARIMIDEX) 1 MG tablet TAKE 1 TABLET BY MOUTH EVERY DAY   aspirin EC 81 MG tablet Take 1 tablet (81 mg total) by mouth every other day. Swallow whole.   Calcium Carbonate-Vitamin D 600-400 MG-UNIT tablet Take 1 tablet by mouth 3 (three) times a week.    Coenzyme Q10 (CO Q 10 PO) Take 200 mg by mouth every other day.    ezetimibe (ZETIA) 10 MG tablet Take 1 tablet (10 mg total) by mouth daily.   hydrochlorothiazide (HYDRODIURIL) 25 MG tablet Take 25 mg by mouth daily.   Hyprom-Naphaz-Polysorb-Zn Sulf (CLEAR EYES COMPLETE OP) Place 1 drop into both eyes daily as needed (dry eyes).   metFORMIN (GLUCOPHAGE) 500 MG tablet Take 500 mg by mouth daily.   mometasone (NASONEX) 50 MCG/ACT nasal spray Place 2 sprays into the nose 3 (three) times a week.   Multiple Vitamin (MULTIVITAMIN WITH MINERALS) TABS  tablet Take 1 tablet by mouth 3 (three) times a week.    Omega-3 Fatty Acids (OMEGA 3 PO) Take 1 capsule by mouth 3 (three) times a week.    polyethylene glycol (MIRALAX / GLYCOLAX) 17 g packet Take 17 g by mouth daily.   potassium chloride SA (KLOR-CON) 20 MEQ tablet Take 1 tablet (20 mEq total) by mouth daily.   Probiotic Product (ALIGN) 4 MG CAPS Take 4 mg by mouth daily.   psyllium (HYDROCIL/METAMUCIL) 95 % PACK Take 1 packet by mouth daily.    rosuvastatin (CRESTOR) 40 MG tablet Take 1 tablet (40 mg total) by mouth daily.   TURMERIC PO Take 500 mg by mouth every other day.   vitamin E 400 UNIT capsule Take 400 Units by mouth every other day.     Review of Systems      All other systems reviewed and are otherwise negative except as noted above.  Physical Exam    VS:  BP 126/76   Pulse 73   Resp 20   Ht '5\' 4"'$  (1.626 m)   Wt 173 lb (78.5 kg)   BMI 29.70 kg/m  , BMI Body mass index is 29.7 kg/m.  Wt Readings from Last 3 Encounters:  06/05/21 173 lb (78.5 kg)  04/10/21 180 lb 3.2 oz (81.7 kg)  02/17/21 170 lb (77.1 kg)     GEN: Well nourished, well developed, in no acute  distress. HEENT: normal. Neck: Supple, no JVD, carotid bruits, or masses. Cardiac: RRR, no murmurs, rubs, or gallops. No clubbing, cyanosis, edema.  Radials/PT 2+ and equal bilaterally.  Respiratory:  Respirations regular and unlabored, clear to auscultation bilaterally. GI: Soft, nontender, nondistended. MS: No deformity or atrophy. Skin: Warm and dry, no rash. Neuro:  Strength and sensation are intact. Psych: Normal affect.  Assessment & Plan    PVC/SVT -denies recurrent palpitations.  Continue metoprolol succinate 25 mg daily.  Nonobstructive CAD - Stable with no anginal symptoms. No indication for ischemic evaluation.  GDMT includes aspirin, metoprolol, rosuvastatin, zetia. Heart healthy diet and regular cardiovascular exercise encouraged.    HLD - Continue rosuvastatin and zetia.   LE edema - Resolved. Has returned to HCTZ as she is not requiring Lasix. She is concerned about what to do if swelling returns, will provide Rx for PRN Lasix. Recommend elevation of legs and compression stockings.   Mechanical fall with left hip fracture - s/p ORIF. Completed PT now amulating with cane only in public.   HTN - BP well controlled. Continue current antihypertensive regimen.    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 Continue optimal blood pressure control. Will update echocardiogram for monitoring.   Disposition: Follow up in 3-4 month(s) with Dr. PJohney Frameor APP.  Signed, CLoel Dubonnet NP 06/05/2021, 2:55 PM [place]

## 2021-06-05 NOTE — Patient Instructions (Signed)
Medication Instructions:  Your physician has recommended you make the following change in your medication:   You may take Furosemide (Lasix) '20mg'$  as needed for swelling.  *If you need a refill on your cardiac medications before your next appointment, please call your pharmacy*   Lab Work: None ordered today.   Testing/Procedures: Your physician has requested that you have an echocardiogram. Echocardiography is a painless test that uses sound waves to create images of your heart. It provides your doctor with information about the size and shape of your heart and how well your heart's chambers and valves are working. This procedure takes approximately one hour. There are no restrictions for this procedure.   Follow-Up: At [facility], you and your health needs are our priority.  As part of our continuing mission to provide you with exceptional heart care, we have created designated Provider Care Teams.  These Care Teams include your primary Cardiologist (physician) and Advanced Practice Providers (APPs -  Physician Assistants and Nurse Practitioners) who all work together to provide you with the care you need, when you need it.  We recommend signing up for the patient portal called "MyChart".  Sign up information is provided on this After Visit Summary.  MyChart is used to connect with patients for Virtual Visits (Telemedicine).  Patients are able to view lab/test results, encounter notes, upcoming appointments, etc.  Non-urgent messages can be sent to your provider as well.   To learn more about what you can do with MyChart, go to [email or web address].    Your next appointment:   3-4 month(s)  The format for your next appointment:   In Person  Provider:   You may see Freada Bergeron, MD or one of the following Advanced Practice Providers on your designated Care Team:   Richardson Dopp, PA-C [place], [place] Loel Dubonnet, NP    Other Instructions  Heart Healthy Diet  Recommendations: A low-salt diet is recommended. Meats should be grilled, baked, or boiled. Avoid fried foods. Focus on lean protein sources like fish or chicken with vegetables and fruits. The American Heart Association is a Microbiologist!   Exercise recommendations: The American Heart Association recommends 150 minutes of moderate intensity exercise weekly. Try 30 minutes of moderate intensity exercise 4-5 times per week. This could include walking, jogging, or swimming.  To prevent or reduce lower extremity swelling: Eat a low salt diet. Salt makes the body hold onto extra fluid which causes swelling. Sit with legs elevated. For example, in the recliner or on an [place].  Wear knee-high compression stockings during the daytime. Ones labeled 15-20 mmHg provide good compression.

## 2021-06-07 ENCOUNTER — Encounter (HOSPITAL_BASED_OUTPATIENT_CLINIC_OR_DEPARTMENT_OTHER): Payer: Self-pay | Admitting: Family

## 2021-06-11 DIAGNOSIS — L814 Other melanin hyperpigmentation: Secondary | ICD-10-CM | POA: Diagnosis not present

## 2021-06-11 DIAGNOSIS — D2271 Melanocytic nevi of right lower limb, including hip: Secondary | ICD-10-CM | POA: Diagnosis not present

## 2021-06-11 DIAGNOSIS — D2272 Melanocytic nevi of left lower limb, including hip: Secondary | ICD-10-CM | POA: Diagnosis not present

## 2021-06-11 DIAGNOSIS — L821 Other seborrheic keratosis: Secondary | ICD-10-CM | POA: Diagnosis not present

## 2021-06-11 DIAGNOSIS — D692 Other nonthrombocytopenic purpura: Secondary | ICD-10-CM | POA: Diagnosis not present

## 2021-06-11 DIAGNOSIS — D225 Melanocytic nevi of trunk: Secondary | ICD-10-CM | POA: Diagnosis not present

## 2021-06-11 DIAGNOSIS — D1801 Hemangioma of skin and subcutaneous tissue: Secondary | ICD-10-CM | POA: Diagnosis not present

## 2021-07-01 ENCOUNTER — Ambulatory Visit (HOSPITAL_COMMUNITY): Payer: PPO | Attending: Cardiology

## 2021-07-01 ENCOUNTER — Other Ambulatory Visit: Payer: Self-pay

## 2021-07-01 DIAGNOSIS — I77810 Thoracic aortic ectasia: Secondary | ICD-10-CM | POA: Diagnosis not present

## 2021-07-01 DIAGNOSIS — I10 Essential (primary) hypertension: Secondary | ICD-10-CM | POA: Diagnosis not present

## 2021-07-01 LAB — ECHOCARDIOGRAM COMPLETE
Area-P 1/2: 2.99 cm2
S' Lateral: 2.8 cm

## 2021-07-20 DIAGNOSIS — Z853 Personal history of malignant neoplasm of breast: Secondary | ICD-10-CM | POA: Diagnosis not present

## 2021-07-20 DIAGNOSIS — Z9189 Other specified personal risk factors, not elsewhere classified: Secondary | ICD-10-CM | POA: Diagnosis not present

## 2021-07-23 DIAGNOSIS — Z170 Estrogen receptor positive status [ER+]: Secondary | ICD-10-CM | POA: Diagnosis not present

## 2021-07-23 DIAGNOSIS — C50911 Malignant neoplasm of unspecified site of right female breast: Secondary | ICD-10-CM | POA: Diagnosis not present

## 2021-07-24 ENCOUNTER — Telehealth: Payer: Self-pay | Admitting: *Deleted

## 2021-07-24 NOTE — Telephone Encounter (Signed)
Spoke with patient regarding joint pain/arthritis.  Instructed patient to stop anastrozole for 2 weeks per Dr. Lindi Adie and then he will see her after that to discuss.  Offered virtual visit but patient prefers in person. Appt confirmed for 11/8 at 830am.

## 2021-07-27 DIAGNOSIS — I10 Essential (primary) hypertension: Secondary | ICD-10-CM | POA: Diagnosis not present

## 2021-07-27 DIAGNOSIS — E1169 Type 2 diabetes mellitus with other specified complication: Secondary | ICD-10-CM | POA: Diagnosis not present

## 2021-07-27 DIAGNOSIS — E7800 Pure hypercholesterolemia, unspecified: Secondary | ICD-10-CM | POA: Diagnosis not present

## 2021-08-03 DIAGNOSIS — E7800 Pure hypercholesterolemia, unspecified: Secondary | ICD-10-CM | POA: Diagnosis not present

## 2021-08-03 DIAGNOSIS — E1169 Type 2 diabetes mellitus with other specified complication: Secondary | ICD-10-CM | POA: Diagnosis not present

## 2021-08-03 DIAGNOSIS — Z0000 Encounter for general adult medical examination without abnormal findings: Secondary | ICD-10-CM | POA: Diagnosis not present

## 2021-08-03 DIAGNOSIS — I10 Essential (primary) hypertension: Secondary | ICD-10-CM | POA: Diagnosis not present

## 2021-08-03 DIAGNOSIS — M8580 Other specified disorders of bone density and structure, unspecified site: Secondary | ICD-10-CM | POA: Diagnosis not present

## 2021-08-17 ENCOUNTER — Other Ambulatory Visit: Payer: Self-pay

## 2021-08-17 DIAGNOSIS — E7800 Pure hypercholesterolemia, unspecified: Secondary | ICD-10-CM

## 2021-08-17 DIAGNOSIS — Z79899 Other long term (current) drug therapy: Secondary | ICD-10-CM

## 2021-08-17 DIAGNOSIS — I10 Essential (primary) hypertension: Secondary | ICD-10-CM

## 2021-08-17 DIAGNOSIS — I2510 Atherosclerotic heart disease of native coronary artery without angina pectoris: Secondary | ICD-10-CM

## 2021-08-17 DIAGNOSIS — E782 Mixed hyperlipidemia: Secondary | ICD-10-CM

## 2021-08-17 MED ORDER — POTASSIUM CHLORIDE CRYS ER 20 MEQ PO TBCR: 20.0000 meq | EXTENDED_RELEASE_TABLET | Freq: Every day | ORAL | 2 refills | Status: DC

## 2021-08-17 NOTE — Progress Notes (Signed)
Patient Care Team: Lawerance Cruel, MD as PCP - General (Family Medicine) Freada Bergeron, MD as PCP - Cardiology (Cardiology) Nicholas Lose, MD as Consulting Physician (Hematology and Oncology) Jovita Kussmaul, MD as Consulting Physician (General Surgery) Kyung Rudd, MD as Consulting Physician (Radiation Oncology)  DIAGNOSIS:    ICD-10-CM   1. Malignant neoplasm of lower-outer quadrant of right breast of female, estrogen receptor positive ([place])  C50.511    Z17.0       SUMMARY OF ONCOLOGIC HISTORY: Oncology History  Malignant neoplasm of lower-outer quadrant of right breast of female, estrogen receptor positive ([place])  02/15/2020 Initial Diagnosis   Screening mammogram detected right breast mass. 0.4cm mass at the 6:30 position and no axillary adenopathy. Biopsy: invasive ductal carcinoma, grade 3, HER-2 negative (1+), ER+ 95%, PR- 0%, KI67 40%.   03/20/2020 Surgery   Right lumpectomy [name]) [phone]): invasive and in situ ductal carcinoma, 0.5cm, clear margins, 4 right axillary lymph nodes negative    04/21/2020 - 05/16/2020 Radiation Therapy   The patient initially received a dose of 42.56 Gy in 16 fractions to the breast using whole-breast tangent fields. This was delivered using a 3-D conformal technique. The patient then received a boost to the seroma. This delivered an additional 8 Gy in 60fractions using a 3 field photon technique due to the depth of the seroma. The total dose was 50.56 Gy.   05/2020 - 05/2025 Anti-estrogen oral therapy   Anastrozole     CHIEF COMPLIANT: Follow-up of right breast cancer on anastrozole  INTERVAL HISTORY: Andrea Farmer is a 80 y.o. with above-mentioned history of above-mentioned history of right breast cancer who underwent a right lumpectomy and radiation treatment, and is currently on anti-estrogen therapy. She reports to the clinic today for follow-up.  She tells me that since she fell and broke her hip she has not been able to tolerate  anastrozole well.  She thinks that the anastrozole is making her body stiffness and achiness much worse.  We stopped anastrozole 2 weeks ago and suddenly most of her the aches and pains have improved significantly.  She is here today to discuss switching her to a different medication.  ALLERGIES:  is allergic to tramadol.  MEDICATIONS:  Current Outpatient Medications  Medication Sig Dispense Refill   acetaminophen (TYLENOL) 500 MG tablet Take 1,000 mg by mouth every 6 (six) hours as needed for moderate pain.     amLODipine-benazepril (LOTREL) 5-40 MG capsule Take 1 capsule by mouth daily.     anastrozole (ARIMIDEX) 1 MG tablet TAKE 1 TABLET BY MOUTH EVERY DAY 90 tablet 2   aspirin EC 81 MG tablet Take 1 tablet (81 mg total) by mouth every other day. Swallow whole. 90 tablet 3   Calcium Carbonate-Vitamin D 600-400 MG-UNIT tablet Take 1 tablet by mouth 3 (three) times a week.      Coenzyme Q10 (CO Q 10 PO) Take 200 mg by mouth every other day.      ezetimibe (ZETIA) 10 MG tablet Take 1 tablet (10 mg total) by mouth daily. 90 tablet 2   furosemide (LASIX) 20 MG tablet Take 1 tablet (20 mg total) by mouth as needed for fluid or edema. 30 tablet 1   hydrochlorothiazide (HYDRODIURIL) 25 MG tablet Take 25 mg by mouth daily.     Hyprom-Naphaz-Polysorb-Zn Sulf (CLEAR EYES COMPLETE OP) Place 1 drop into both eyes daily as needed (dry eyes).     metFORMIN (GLUCOPHAGE) 500 MG tablet Take 500 mg  by mouth daily.     mometasone (NASONEX) 50 MCG/ACT nasal spray Place 2 sprays into the nose 3 (three) times a week.     Multiple Vitamin (MULTIVITAMIN WITH MINERALS) TABS tablet Take 1 tablet by mouth 3 (three) times a week.      Omega-3 Fatty Acids (OMEGA 3 PO) Take 1 capsule by mouth 3 (three) times a week.      polyethylene glycol (MIRALAX / GLYCOLAX) 17 g packet Take 17 g by mouth daily.     potassium chloride SA (KLOR-CON) 20 MEQ tablet Take 1 tablet (20 mEq total) by mouth daily. 90 tablet 2   Probiotic  Product (ALIGN) 4 MG CAPS Take 4 mg by mouth daily.     psyllium (HYDROCIL/METAMUCIL) 95 % PACK Take 1 packet by mouth daily.      rosuvastatin (CRESTOR) 40 MG tablet Take 1 tablet (40 mg total) by mouth daily. 90 tablet 2   TURMERIC PO Take 500 mg by mouth every other day.     vitamin E 400 UNIT capsule Take 400 Units by mouth every other day.     No current facility-administered medications for this visit.    PHYSICAL EXAMINATION: ECOG PERFORMANCE STATUS: 1 - Symptomatic but completely ambulatory  Vitals:   08/18/21 0825  BP: 137/65  Pulse: 71  Resp: 18  Temp: (!) 97.5 F (36.4 C)  SpO2: 98%   Filed Weights   08/18/21 0825  Weight: 175 lb 4.8 oz (79.5 kg)      LABORATORY DATA:  I have reviewed the data as listed CMP Latest Ref Rng & Units 02/24/2021 11/29/2020 11/28/2020  Glucose 65 - 99 mg/dL 108(H) 108(H) 137(H)  BUN 8 - 27 mg/dL $Remove'13 16 23  'FtLJBHJ$ Creatinine 0.57 - 1.00 mg/dL 0.60 0.55 0.68  Sodium 134 - 144 mmol/L 141 137 136  Potassium 3.5 - 5.2 mmol/L 4.3 4.0 4.2  Chloride 96 - 106 mmol/L 102 101 100  CO2 20 - 29 mmol/L $RemoveB'21 28 29  'qtZJxMSQ$ Calcium 8.7 - 10.3 mg/dL 10.7(H) 8.4(L) 8.6(L)  Total Protein 6.5 - 8.1 g/dL - - -  Total Bilirubin 0.3 - 1.2 mg/dL - - -  Alkaline Phos 38 - 126 U/L - - -  AST 15 - 41 U/L - - -  ALT 0 - 44 U/L - - -    Lab Results  Component Value Date   WBC 6.6 11/29/2020   HGB 8.9 (L) 11/29/2020   HCT 27.8 (L) 11/29/2020   MCV 95.2 11/29/2020   PLT 190 11/29/2020   NEUTROABS 8.0 (H) 11/25/2020    ASSESSMENT & PLAN:  Malignant neoplasm of lower-outer quadrant of right breast of female, estrogen receptor positive ([place]) 02/15/2020:Screening mammogram detected right breast mass. 0.4cm mass at the 6:30 position and no axillary adenopathy. Biopsy: invasive ductal carcinoma, grade 3, HER-2 negative (1+), ER+ 95%, PR- 0%, KI67 40%. T1 a N0 stage Ia clinical stage   03/20/2020:Right lumpectomy [name]): invasive and in situ ductal carcinoma, 0.5cm, clear  margins, 4 right axillary lymph nodes negative T1 a N0 stage Ia pathologic stage 04/22/2020-05/12/2020: Adjuvant radiation With radiation she felt slightly depressed to the point that she was crying.  She improved with the counseling and support of her husband.   Treatment plan: Adjuvant antiestrogen therapy with anastrozole 1 mg daily x5 years started 06/02/2020, switched to letrozole 09/07/2021   Anastrozole toxicities:  Severe osteoarthritis She stopped anastrozole for 2 weeks: Her symptoms have improved substantially. We will switch her to letrozole.  She will start this 09/07/2021  Fracture of the left hip February 2022: Status post surgery.  We will get another bone density and November 2022.   Breast cancer surveillance: 1.  Breast exam 04/10/2021: Benign, right breast lymphedema 2. mammogram 02/06/2021: Benign breast density category B   telephone visit mid December to discuss tolerance to letrozole therapy    No orders of the defined types were placed in this encounter.  The patient has a good understanding of the overall plan. she agrees with it. she will call with any problems that may develop before the next visit here.  Total time spent: 30 mins including face to face time and time spent for planning, charting and coordination of care  Rulon Eisenmenger, MD, MPH 08/18/2021  I, Thana Ates, am acting as scribe for Dr. Nicholas Lose.  I have reviewed the above documentation for accuracy and completeness, and I agree with the above.

## 2021-08-18 ENCOUNTER — Other Ambulatory Visit: Payer: Self-pay

## 2021-08-18 ENCOUNTER — Inpatient Hospital Stay: Payer: PPO | Attending: Hematology and Oncology | Admitting: Hematology and Oncology

## 2021-08-18 DIAGNOSIS — Z79811 Long term (current) use of aromatase inhibitors: Secondary | ICD-10-CM | POA: Insufficient documentation

## 2021-08-18 DIAGNOSIS — Z170 Estrogen receptor positive status [ER+]: Secondary | ICD-10-CM | POA: Insufficient documentation

## 2021-08-18 DIAGNOSIS — C50511 Malignant neoplasm of lower-outer quadrant of right female breast: Secondary | ICD-10-CM | POA: Diagnosis not present

## 2021-08-18 DIAGNOSIS — Z923 Personal history of irradiation: Secondary | ICD-10-CM | POA: Diagnosis not present

## 2021-08-18 DIAGNOSIS — M1990 Unspecified osteoarthritis, unspecified site: Secondary | ICD-10-CM | POA: Insufficient documentation

## 2021-08-18 MED ORDER — LETROZOLE 2.5 MG PO TABS: 2.5000 mg | ORAL_TABLET | Freq: Every day | ORAL | 3 refills | Status: DC

## 2021-08-18 NOTE — Assessment & Plan Note (Signed)
02/15/2020:Screening mammogram detected right breast mass. 0.4cm mass at the 6:30 position and no axillary adenopathy. Biopsy: invasive ductal carcinoma, grade 3, HER-2 negative (1+), ER+ 95%, PR- 0%, KI67 40%. T1 a N0 stage Ia clinical stage  03/20/2020:Right lumpectomy [name]): invasive and in situ ductal carcinoma, 0.5cm, clear margins, 4 right axillary lymph nodes negativeT1 a N0 stage Ia pathologic stage 04/22/2020-05/12/2020: Adjuvant radiation With radiation she felt slightly depressed to the point that she was crying. She improved with the counseling and support of her husband.  Treatment plan: Adjuvant antiestrogen therapywith anastrozole 1 mg daily x5 yearsstarted 06/02/2020  Anastrozole toxicities:  Severe osteoarthritis She stopped anastrozole for 2 weeks.  Fracture of the left hip February 2022: Status post surgery.  We will get another bone density and November 2022.  Breast cancer surveillance: 1.  Breast exam 04/10/2021: Benign, right breast lymphedema 2. mammogram 02/06/2021: Benign breast density category B  Return to clinic in 1 year for follow-up

## 2021-09-14 DIAGNOSIS — H52203 Unspecified astigmatism, bilateral: Secondary | ICD-10-CM | POA: Diagnosis not present

## 2021-09-14 DIAGNOSIS — E119 Type 2 diabetes mellitus without complications: Secondary | ICD-10-CM | POA: Diagnosis not present

## 2021-09-14 DIAGNOSIS — Z961 Presence of intraocular lens: Secondary | ICD-10-CM | POA: Diagnosis not present

## 2021-09-14 DIAGNOSIS — H524 Presbyopia: Secondary | ICD-10-CM | POA: Diagnosis not present

## 2021-09-27 NOTE — Progress Notes (Signed)
HEMATOLOGY-ONCOLOGY TELEPHONE VISIT PROGRESS NOTE  I connected with Andrea Farmer on 09/28/2021 at  9:15 AM EST by telephone and verified that I am speaking with the correct person using two identifiers.  I discussed the limitations, risks, security and privacy concerns of performing an evaluation and management service by telephone and the availability of in person appointments.  I also discussed with the patient that there may be a patient responsible charge related to this service. The patient expressed understanding and agreed to proceed.   History of Present Illness: Andrea Farmer is a 80 y.o. female with above-mentioned history of breast cancer who underwent a right lumpectomy and radiation treatment, and is currently on anti-estrogen therapy. She reports via telephone today for follow-up.  Since she stopped taking anastrozole her joint aches and pains have improved.  She started letrozole 2 weeks ago and she is feeling significantly better without any further problems with pain.  Oncology History  Malignant neoplasm of lower-outer quadrant of right breast of female, estrogen receptor positive ([place])  02/15/2020 Initial Diagnosis   Screening mammogram detected right breast mass. 0.4cm mass at the 6:30 position and no axillary adenopathy. Biopsy: invasive ductal carcinoma, grade 3, HER-2 negative (1+), ER+ 95%, PR- 0%, KI67 40%.   03/20/2020 Surgery   Right lumpectomy [name]) [phone]): invasive and in situ ductal carcinoma, 0.5cm, clear margins, 4 right axillary lymph nodes negative    04/21/2020 - 05/16/2020 Radiation Therapy   The patient initially received a dose of 42.56 Gy in 16 fractions to the breast using whole-breast tangent fields. This was delivered using a 3-D conformal technique. The patient then received a boost to the seroma. This delivered an additional 8 Gy in 66fractions using a 3 field photon technique due to the depth of the seroma. The total dose was 50.56 Gy.   05/2020 -  05/2025 Anti-estrogen oral therapy   Anastrozole     Observations/Objective:     Assessment Plan:  Malignant neoplasm of lower-outer quadrant of right breast of female, estrogen receptor positive ([place]) 02/15/2020:Screening mammogram detected right breast mass. 0.4cm mass at the 6:30 position and no axillary adenopathy. Biopsy: invasive ductal carcinoma, grade 3, HER-2 negative (1+), ER+ 95%, PR- 0%, KI67 40%. T1 a N0 stage Ia clinical stage   03/20/2020:Right lumpectomy [name]): invasive and in situ ductal carcinoma, 0.5cm, clear margins, 4 right axillary lymph nodes negative T1 a N0 stage Ia pathologic stage 04/22/2020-05/12/2020: Adjuvant radiation With radiation she felt slightly depressed to the point that she was crying.  She improved with the counseling and support of her husband.   Treatment plan: Adjuvant antiestrogen therapy with anastrozole 1 mg daily  06/02/2020- 10/22 (stopped because of muscle aches and pains) started 09/13/21 letrozole   Letrozole toxicities:  Doing much better from antiestrogen therapy standpoint.  The joint aches and pains have improved markedly.  She does have chronic osteoarthritis.. Fracture of the left hip February 2022: Status post surgery.  We will get another bone density and November 2022.    Breast cancer surveillance: 1.  Breast exam 04/10/2021: Benign, right breast lymphedema 2. mammogram 02/06/2021: Benign breast density category B   Return to clinic in 1 year for follow-up      I discussed the assessment and treatment plan with the patient. The patient was provided an opportunity to ask questions and all were answered. The patient agreed with the plan and demonstrated an understanding of the instructions. The patient was advised to call back or seek an in-person  evaluation if the symptoms worsen or if the condition fails to improve as anticipated.   Total time spent: 15 mins including non-face to face time and time spent for planning, charting and  coordination of care  Rulon Eisenmenger, MD 09/28/2021    I, Thana Ates, am acting as scribe for Nicholas Lose, MD.  I have reviewed the above documentation for accuracy and completeness, and I agree with the above.

## 2021-09-28 ENCOUNTER — Inpatient Hospital Stay: Payer: PPO | Attending: Hematology and Oncology | Admitting: Hematology and Oncology

## 2021-09-28 DIAGNOSIS — Z170 Estrogen receptor positive status [ER+]: Secondary | ICD-10-CM | POA: Diagnosis not present

## 2021-09-28 DIAGNOSIS — C50511 Malignant neoplasm of lower-outer quadrant of right female breast: Secondary | ICD-10-CM | POA: Diagnosis not present

## 2021-09-28 NOTE — Assessment & Plan Note (Signed)
02/15/2020:Screening mammogram detected right breast mass. 0.4cm mass at the 6:30 position and no axillary adenopathy. Biopsy: invasive ductal carcinoma, grade 3, HER-2 negative (1+), ER+ 95%, PR- 0%, KI67 40%. T1 a N0 stage Ia clinical stage  03/20/2020:Right lumpectomy Andrea Farmer): invasive and in situ ductal carcinoma, 0.5cm, clear margins, 4 right axillary lymph nodes negativeT1 a N0 stage Ia pathologic stage 04/22/2020-05/12/2020: Adjuvant radiation With radiation she felt slightly depressed to the point that she was crying. She improved with the counseling and support of her husband.  Treatment plan: Adjuvant antiestrogen therapywith anastrozole 1 mg daily  06/02/2020- 10/22 (stopped because of muscle aches and pains) started 09/13/21 letrozole  Letrozole toxicities:  Doing much better from antiestrogen therapy standpoint.  The joint aches and pains have improved markedly.  She does have chronic osteoarthritis.. Fracture of the left hip February 2022: Status post surgery.  We will get another bone density and November 2022.   Breast cancer surveillance: 1.  Breast exam 04/10/2021: Benign, right breast lymphedema 2. mammogram 02/06/2021: Benign breast density category B  Return to clinic in 1 year for follow-up

## 2021-10-16 ENCOUNTER — Other Ambulatory Visit: Payer: PPO

## 2021-10-20 DIAGNOSIS — M545 Low back pain, unspecified: Secondary | ICD-10-CM | POA: Diagnosis not present

## 2021-10-28 DIAGNOSIS — M545 Low back pain, unspecified: Secondary | ICD-10-CM | POA: Diagnosis not present

## 2021-11-04 DIAGNOSIS — Z170 Estrogen receptor positive status [ER+]: Secondary | ICD-10-CM | POA: Diagnosis not present

## 2021-11-04 DIAGNOSIS — C50911 Malignant neoplasm of unspecified site of right female breast: Secondary | ICD-10-CM | POA: Diagnosis not present

## 2021-11-07 NOTE — Progress Notes (Deleted)
Cardiology Office Note:    Date:  11/07/2021   ID:  Andrea Farmer, DOB 20-Mar-1941, MRN [id number]  PCP:  Lawerance Cruel, MD   [facility] Cardiologist:  Freada Bergeron, MD {   Referring MD: Lawerance Cruel, MD    History of Present Illness:    Andrea Farmer is a 81 y.o. 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 Meda Coffee who now returns to clinic for follow-up.  Per review of the chart, in spring of 2021, she noted increased palpitations. Monitor in 01/2020 showed 11% PVC burden, multiple runs of atrial tachycardia, and short runs of non-sustained VT. Echo showed LVEF of 60-65% with normal wall motion, grade 1 diastolic dysfunction, and mild MR. She was last seen by Dr. Meda Coffee in 02/2020 at which time she reported fatigue but was otherwise. Given worsening number of PVCs, repeat coronary CTA showed moderate disease that was negative by CT FFR.  Last seen in clinic on by Laurann Montana on 05/2021 after recovering from her hip surgery. Otherwise she was doing well from a CV perspective.   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   Pneumonia    30 plus yrs ago    Past Surgical History:  Procedure Laterality Date   ABDOMINAL HYSTERECTOMY     BREAST BIOPSY Right 05/14/2015   BREAST EXCISIONAL BIOPSY Left    BREAST EXCISIONAL BIOPSY Right    BREAST LUMPECTOMY WITH RADIOACTIVE  SEED AND SENTINEL LYMPH NODE BIOPSY Right 03/20/2020   Procedure: RIGHT BREAST LUMPECTOMY WITH RADIOACTIVE SEED AND SENTINEL LYMPH NODE BIOPSY;  Surgeon: Jovita Kussmaul, MD;  Location: [facility];  Service: General;  Laterality: Right;   BREAST LUMPECTOMY WITH RADIOACTIVE SEED LOCALIZATION Right 04/27/2019   Procedure: RIGHT BREAST LUMPECTOMY WITH RADIOACTIVE SEED LOCALIZATION;  Surgeon: Jovita Kussmaul, MD;  Location: [facility];  Service: General;  Laterality: Right;   INTRAMEDULLARY (IM) NAIL INTERTROCHANTERIC Left 11/26/2020   Procedure: INTRAMEDULLARY (IM) NAIL INTERTROCHANTRIC;  Surgeon: Paralee Cancel, MD;  Location: [facility];  Service: Orthopedics;  Laterality: Left;   RECTAL SURGERY     x 3   REPLACEMENT TOTAL KNEE BILATERAL     SYNOVECTOMY Left 07/21/2015   Procedure: LEFT KNEE SYNOVECTOMY with POLY EXCHANGE;  Surgeon: Vickey Huger, MD;  Location: [facility];  Service: Orthopedics;  Laterality: Left;   TOTAL KNEE REVISION Right 07/05/2016   Procedure: TOTAL KNEE REVISION WITH SCAR DEBRIDEMENT/PATELLA REVISION WITH POLY EXCHANGE;  Surgeon: Vickey Huger, MD;  Location: [facility];  Service: Orthopedics;  Laterality: Right;    Current Medications: No outpatient medications have been marked as taking for the 11/10/21 encounter (Appointment) with Freada Bergeron, MD.     Allergies:   Tramadol   Social History  Socioeconomic History   Marital status: Soil scientist    Spouse name: Not on file   Number of children: Not on file   Years of education: Not on file   Highest education level: Not on file  Occupational History   Not on file  Tobacco Use   Smoking status: Never   Smokeless tobacco: Never  Vaping Use   Vaping Use: Never used  Substance and Sexual Activity   Alcohol use: Yes    Comment: WINE OCC   Drug use: No   Sexual activity: Not on file  Other Topics Concern   Not on file  Social History Narrative   Not on file   Social Determinants of  Health   Financial Resource Strain: Not on file  Food Insecurity: Not on file  Transportation Needs: Not on file  Physical Activity: Not on file  Stress: Not on file  Social Connections: Not on file     Family History: The patient's family history includes Aneurysm in her father; Cancer in her mother.  ROS:   Please see the history of present illness.    Review of Systems  Constitutional:  Negative for chills and fever.  HENT:  Negative for hearing loss.   Eyes:  Negative for blurred vision and redness.  Respiratory:  Negative for shortness of breath.   Cardiovascular:  Positive for leg swelling. Negative for chest pain, palpitations, orthopnea, claudication and PND.  Gastrointestinal:  Negative for melena, nausea and vomiting.  Genitourinary:  Negative for flank pain.  Musculoskeletal:  Positive for falls, joint pain and myalgias.  Neurological:  Negative for dizziness and loss of consciousness.  Endo/Heme/Allergies:  Negative for polydipsia.  Psychiatric/Behavioral:  Negative for substance abuse.    EKGs/Labs/Other Studies Reviewed:    The following studies were reviewed today: TTE 06-Jul-2021: IMPRESSIONS     1. Left ventricular ejection fraction, by estimation, is 60 to 65%. The  left ventricle has normal function. The left ventricle has no regional  wall motion abnormalities. Left ventricular diastolic parameters were  normal.   2. Right ventricular systolic function is normal. The right ventricular  size is normal. There is normal pulmonary artery systolic pressure.   3. The mitral valve is abnormal. Mild mitral valve regurgitation. No  evidence of mitral stenosis. Severe mitral annular calcification.   4. The aortic valve is tricuspid. There is mild calcification of the  aortic valve. Aortic valve regurgitation is not visualized. Mild aortic  valve sclerosis is present, with no evidence of aortic valve stenosis.   5. Ascending aorta measurement unchanged from 02/20/20.  Aortic dilatation  noted. There is borderline dilatation of the ascending aorta, measuring 39  mm.   6. The inferior vena cava is normal in size with greater than 50%  respiratory variability, suggesting right atrial pressure of 3 mmHg.   Comparison(s): No significant change from prior study.   TTE 02/20/2020  1. Left ventricular ejection fraction, by estimation, is 60 to 65%. The  left ventricle has normal function. The left ventricle has no regional  wall motion abnormalities. There is mild left ventricular hypertrophy.  Left ventricular diastolic parameters  are consistent with Grade I diastolic dysfunction (impaired relaxation).   2. Right ventricular systolic function is normal. The right ventricular  size is normal. There is normal pulmonary artery systolic pressure. The  estimated right ventricular systolic pressure is 41.9 mmHg.   3. The mitral valve is abnormal. Mild mitral valve regurgitation.   4. The aortic valve is  tricuspid. Aortic valve regurgitation is not  visualized.   5. Aortic dilatation noted. 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   6. The inferior vena cava is normal in size with greater than 50%  respiratory variability, suggesting right atrial pressure of 3 mmHg.    Coronary CTA 2020-03-05 25-49% RCA 25-49% left main Proximal LAD 50-69% Mid LAD 50-69% Minimal less than 25% left circumflex CT FFR negative  EKG:  EKG is  ordered today.  The ekg ordered today demonstrates NSR with LAD, HR 70  Recent Labs: 11/25/2020: ALT 19 11/29/2020: Hemoglobin 8.9; Platelets 190 02/24/2021: BUN 13; Creatinine, Ser 0.60; Potassium 4.3; Sodium 141  Recent Lipid Panel    Component Value Date/Time   CHOL 140 02/17/2021 1210   CHOL 164 05/20/2016 1127   TRIG 96 02/17/2021 1210   TRIG 88 05/20/2016 1127   HDL 57 02/17/2021 1210   HDL 55 05/20/2016 1127   CHOLHDL 2.5 02/17/2021 1210   CHOLHDL 3.0 05/20/2016 1127   LDLCALC 65 02/17/2021 1210    LDLCALC 91 05/20/2016 1127      Physical Exam:    VS:  There were no vitals taken for this visit.    Wt Readings from Last 3 Encounters:  08/18/21 175 lb 4.8 oz (79.5 kg)  06/05/21 173 lb (78.5 kg)  04/10/21 180 lb 3.2 oz (81.7 kg)     GEN:  Well nourished, well developed in no acute distress HEENT: Normal NECK: No JVD; No carotid bruits CARDIAC: RRR, no murmurs, rubs, gallops RESPIRATORY:  Clear to auscultation without rales, wheezing or rhonchi  ABDOMEN: Soft, non-tender, non-distended MUSCULOSKELETAL:  Trace-1+ bilateral edema SKIN: Warm and dry NEUROLOGIC:  Alert and oriented x 3 PSYCHIATRIC:  Normal affect   ASSESSMENT:    No diagnosis found.  PLAN:    In order of problems listed above:  #PVCs/brief SVT Recent monitor with 11.3% PVC burden. TTE with normal LVEF, no significant valve disease, normal RV. Occasional palpitations but overall not very symptomatic. Doing well on metop. -Continue metop 25mg  XL daily   #Nonobstructive CAD Coronary CTA 02/2020 with 25-50% RCA, 25-50% LM, 50-70% pLAD, 50-70%mLAD, <25% LCx. FFR negative. No anginal symptoms.  -Continue ASA 81mg  daily -Continue home Crestor 40mg  daily and Zetia 10mg  daily  #HLD: -Continue crestor 40mg  daily and zetia 10mg  daily -Repeat lipids today with goal LDL<70  #LE Edema: Improved.  -Continue HCTZ 25mg  daily  #Mechanical fall with left hip fracture: S/p ORIF. Doing well and working with PT. -Follow-up with ortho as scheduled  #HTN: Well controlled. 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 -Serial monitoring  Medication Adjustments/Labs and Tests Ordered: Current medicines are reviewed at length with the patient today.  Concerns regarding medicines are outlined above.  No orders of the defined types were placed in this encounter.  No orders of the defined types were placed in this  encounter.   There are no Patient Instructions on file for this visit.    Signed, Freada Bergeron, MD  11/07/2021 8:55 PM    [place]

## 2021-11-09 ENCOUNTER — Telehealth: Payer: Self-pay | Admitting: *Deleted

## 2021-11-09 NOTE — Telephone Encounter (Signed)
Received call from pt with complaint of clear vaginal discharge x1 month.  Pt denies odor or irritation.  States she f/u with GYN who has stated it is due to vaginal dryness and her body tying to excrete discharge to combat the dryness.  Pt requesting advice from MD.  MD states he agrees with GYN and requests pt continue to follow GYN for further evaluation and tx.  Pt verbalized understanding.

## 2021-11-10 ENCOUNTER — Encounter: Payer: Self-pay | Admitting: Cardiology

## 2021-11-10 ENCOUNTER — Ambulatory Visit: Payer: PPO | Admitting: Cardiology

## 2021-11-10 ENCOUNTER — Other Ambulatory Visit: Payer: Self-pay

## 2021-11-10 VITALS — BP 142/82 | HR 80 | Ht 64.0 in | Wt 174.8 lb

## 2021-11-10 DIAGNOSIS — I493 Ventricular premature depolarization: Secondary | ICD-10-CM

## 2021-11-10 DIAGNOSIS — I471 Supraventricular tachycardia: Secondary | ICD-10-CM | POA: Diagnosis not present

## 2021-11-10 DIAGNOSIS — I2510 Atherosclerotic heart disease of native coronary artery without angina pectoris: Secondary | ICD-10-CM

## 2021-11-10 DIAGNOSIS — I10 Essential (primary) hypertension: Secondary | ICD-10-CM

## 2021-11-10 DIAGNOSIS — I77810 Thoracic aortic ectasia: Secondary | ICD-10-CM | POA: Diagnosis not present

## 2021-11-10 DIAGNOSIS — M545 Low back pain, unspecified: Secondary | ICD-10-CM | POA: Diagnosis not present

## 2021-11-10 DIAGNOSIS — E785 Hyperlipidemia, unspecified: Secondary | ICD-10-CM | POA: Diagnosis not present

## 2021-11-10 NOTE — Progress Notes (Signed)
Cardiology Office Note:    Date:  11/10/2021   ID:  Andrea Farmer, DOB [birth year], MRN [id number]  PCP:  Andrea Cruel, MD   [place] Providers Cardiologist:  Freada Bergeron, MD {   Referring MD: Andrea Cruel, MD    History of Present Illness:    Andrea Farmer is a 81 y.o. 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 Meda Farmer who now returns to clinic for follow-up.  Per review of the chart, in spring of 2021, she noted increased palpitations. Monitor in 01/2020 showed 11% PVC burden, multiple runs of atrial tachycardia, and short runs of non-sustained VT. Echo showed LVEF of 60-65% with normal wall motion, grade 1 diastolic dysfunction, and mild MR. She was last seen by Dr. Meda Farmer in 02/2020 at which time she reported fatigue but was otherwise. Given worsening number of PVCs, repeat coronary CTA showed moderate disease that was negative by CT FFR.  Last seen in clinic on by Andrea Farmer on 05/2021 after recovering from her hip surgery. Otherwise she was doing well from a CV perspective.   Today, she is doing well. She has recovered well from  her breast and hip surgeries. 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 She is not currently taking Lasix. When she leaves the house, she uses a walking cane. Her last A1c from Dr. Harrington Challenger was 5.2. She is watching her diet and portions.   She stays active by walking and using her stair machine. Her hip does not limit her activity or ability to complete daily activities like cooking. However, she does notice a difference in her mobility since her hip surgery.  She currently sees [facility] for lower back pain.   The patient denies chest pain, chest pressure, dyspnea at rest or with exertion, palpitations, PND, orthopnea, or leg  swelling. Denies cough, fever, chills. Denies nausea, vomiting. Denies syncope or presyncope. Denies dizziness or lightheadedness. Denies snoring.  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   Pneumonia    30 plus yrs ago    Past Surgical History:  Procedure Laterality Date   ABDOMINAL HYSTERECTOMY     BREAST BIOPSY Right 05/14/2015   BREAST EXCISIONAL BIOPSY Left    BREAST EXCISIONAL BIOPSY Right    BREAST LUMPECTOMY WITH RADIOACTIVE SEED AND SENTINEL LYMPH NODE BIOPSY Right 03/20/2020   Procedure: RIGHT BREAST LUMPECTOMY WITH RADIOACTIVE SEED AND SENTINEL LYMPH NODE BIOPSY;  Surgeon: Andrea Kussmaul, MD;  Location: [place];  Service: General;  Laterality: Right;   BREAST LUMPECTOMY WITH RADIOACTIVE SEED LOCALIZATION Right 04/27/2019   Procedure: RIGHT BREAST LUMPECTOMY WITH RADIOACTIVE SEED LOCALIZATION;  Surgeon: Andrea Kussmaul, MD;  Location: [place];  Service: General;  Laterality: Right;   INTRAMEDULLARY (  IM) NAIL INTERTROCHANTERIC Left 11/26/2020   Procedure: INTRAMEDULLARY (IM) NAIL INTERTROCHANTRIC;  Surgeon: Andrea Cancel, MD;  Location: [facility];  Service: Orthopedics;  Laterality: Left;   RECTAL SURGERY     x 3   REPLACEMENT TOTAL KNEE BILATERAL     SYNOVECTOMY Left 07/21/2015   Procedure: LEFT KNEE SYNOVECTOMY with POLY EXCHANGE;  Surgeon: Andrea Huger, MD;  Location: [place];  Service: Orthopedics;  Laterality: Left;   TOTAL KNEE REVISION Right 07/05/2016   Procedure: TOTAL KNEE REVISION WITH SCAR DEBRIDEMENT/PATELLA REVISION WITH POLY EXCHANGE;  Surgeon:  Andrea Huger, MD;  Location: [place];  Service: Orthopedics;  Laterality: Right;    Current Medications: Current Meds  Medication Sig   acetaminophen (TYLENOL) 500 MG tablet Take 1,000 mg by mouth every 6 (six) hours as needed for moderate pain.   amLODipine-benazepril (LOTREL) 5-40 MG capsule Take 1 capsule by mouth daily.   aspirin EC 81 MG tablet Take 1 tablet (81 mg total) by mouth every other day. Swallow whole.   Calcium Carbonate-Vitamin D 600-400 MG-UNIT tablet Take 1 tablet by mouth 3 (three) times a week.    Coenzyme Q10 (CO Q 10 PO) Take 200 mg by mouth every other day.    ezetimibe (ZETIA) 10 MG tablet Take 1 tablet (10 mg total) by mouth daily.   hydrochlorothiazide (HYDRODIURIL) 25 MG tablet Take 25 mg by mouth daily.   Hyprom-Naphaz-Polysorb-Zn Sulf (CLEAR EYES COMPLETE OP) Place 1 drop into both eyes daily as needed (dry eyes).   letrozole (FEMARA) 2.5 MG tablet Take 1 tablet (2.5 mg total) by mouth daily.   metFORMIN (GLUCOPHAGE) 500 MG tablet Take 500 mg by mouth daily.   mometasone (NASONEX) 50 MCG/ACT nasal spray Place 2 sprays into the nose 3 (three) times a week.   Multiple Vitamin (MULTIVITAMIN WITH MINERALS) TABS tablet Take 1 tablet by mouth 3 (three) times a week.    Omega-3 Fatty Acids (OMEGA 3 PO) Take 1 capsule by mouth 3 (three) times a week.    polyethylene glycol (MIRALAX / GLYCOLAX) 17 g packet Take 17 g by mouth daily.   potassium chloride SA (KLOR-CON) 20 MEQ tablet Take 1 tablet (20 mEq total) by mouth daily.   Probiotic Product (ALIGN) 4 MG CAPS Take 4 mg by mouth daily.   psyllium (HYDROCIL/METAMUCIL) 95 % PACK Take 1 packet by mouth daily.    rosuvastatin (CRESTOR) 40 MG tablet Take 1 tablet (40 mg total) by mouth daily.   TURMERIC PO Take 500 mg by mouth every other day.   vitamin E 400 UNIT capsule Take 400 Units by mouth every other day.     Allergies:   Tramadol   Social History   Socioeconomic History   Marital status: Soil scientist     Spouse name: Not on file   Number of children: Not on file   Years of education: Not on file   Highest education level: Not on file  Occupational History   Not on file  Tobacco Use   Smoking status: Never   Smokeless tobacco: Never  Vaping Use   Vaping Use: Never used  Substance and Sexual Activity   Alcohol use: Yes    Comment: WINE OCC   Drug use: No   Sexual activity: Not on file  Other Topics Concern   Not on file  Social History Narrative   Not on file   Social Determinants of Health   Financial Resource Strain: Not on file  Food Insecurity: Not on  file  Transportation Needs: Not on file  Physical Activity: Not on file  Stress: Not on file  Social Connections: Not on file     Family History: The patient's family history includes Aneurysm in her father; Cancer in her mother.  ROS:   Please see the history of present illness.    Review of Systems  Constitutional:  Negative for chills and fever.  HENT:  Negative for hearing loss.   Eyes:  Negative for blurred vision and redness.  Respiratory:  Negative for shortness of breath.   Cardiovascular:  Negative for chest pain, palpitations, orthopnea, claudication, leg swelling and PND.  Gastrointestinal:  Negative for melena, nausea and vomiting.  Genitourinary:  Negative for flank pain.  Musculoskeletal:  Positive for back pain. Negative for falls, joint pain and myalgias.  Skin:  Negative for itching and rash.  Neurological:  Negative for dizziness and loss of consciousness.  Endo/Heme/Allergies:  Negative for polydipsia. Does not bruise/bleed easily.  Psychiatric/Behavioral:  Negative for substance abuse. The patient is nervous/anxious.    EKGs/Labs/Other Studies Reviewed:    The following studies were reviewed today: TTE Jul 06, 2021: IMPRESSIONS   1. Left ventricular ejection fraction, by estimation, is 60 to 65%. The  left ventricle has normal function. The left ventricle has no regional  wall motion  abnormalities. Left ventricular diastolic parameters were  normal.   2. Right ventricular systolic function is normal. The right ventricular  size is normal. There is normal pulmonary artery systolic pressure.   3. The mitral valve is abnormal. Mild mitral valve regurgitation. No  evidence of mitral stenosis. Severe mitral annular calcification.   4. The aortic valve is tricuspid. There is mild calcification of the  aortic valve. Aortic valve regurgitation is not visualized. Mild aortic  valve sclerosis is present, with no evidence of aortic valve stenosis.   5. Ascending aorta measurement unchanged from 02/20/20. Aortic dilatation  noted. There is borderline dilatation of the ascending aorta, measuring 39  mm.   6. The inferior vena cava is normal in size with greater than 50%  respiratory variability, suggesting right atrial pressure of 3 mmHg.   Comparison(s): No significant change from prior study.   TTE 02/20/2020  1. Left ventricular ejection fraction, by estimation, is 60 to 65%. The  left ventricle has normal function. The left ventricle has no regional  wall motion abnormalities. There is mild left ventricular hypertrophy.  Left ventricular diastolic parameters  are consistent with Grade I diastolic dysfunction (impaired relaxation).   2. Right ventricular systolic function is normal. The right ventricular  size is normal. There is normal pulmonary artery systolic pressure. The  estimated right ventricular systolic pressure is 43.1 mmHg.   3. The mitral valve is abnormal. Mild mitral valve regurgitation.   4. The aortic valve is tricuspid. Aortic valve regurgitation is not  visualized.   5. Aortic dilatation noted. 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   6. The inferior vena cava is normal in size with greater than 50%  respiratory variability, suggesting right atrial pressure of 3 mmHg.    Coronary CTA 2020-03-05 25-49% RCA 25-49% left  main Proximal LAD 50-69% Mid LAD 50-69% Minimal less than 25% left circumflex CT FFR negative  EKG:  EKG was not ordered today 02/17/21: NSR with LAD, HR 70  Recent Labs: 11/25/2020: ALT 19 11/29/2020: Hemoglobin 8.9; Platelets 190 02/24/2021: BUN 13; Creatinine, Ser 0.60; Potassium 4.3; Sodium 141  Recent Lipid Panel  Component Value Date/Time   CHOL 140 02/17/2021 1210   CHOL 164 05/20/2016 1127   TRIG 96 02/17/2021 1210   TRIG 88 05/20/2016 1127   HDL 57 02/17/2021 1210   HDL 55 05/20/2016 1127   CHOLHDL 2.5 02/17/2021 1210   CHOLHDL 3.0 05/20/2016 1127   LDLCALC 65 02/17/2021 1210   LDLCALC 91 05/20/2016 1127      Physical Exam:    VS:  BP (!) 142/82    Pulse 80    Ht 5\' 4"  (1.626 m)    Wt 174 lb 12.8 oz (79.3 kg)    SpO2 96%    BMI 30.00 kg/m     Wt Readings from Last 3 Encounters:  11/10/21 174 lb 12.8 oz (79.3 kg)  08/18/21 175 lb 4.8 oz (79.5 kg)  06/05/21 173 lb (78.5 kg)     GEN:  Well nourished, well developed in no acute distress HEENT: Normal NECK: No JVD; No carotid bruits CARDIAC: RRR, 1/6 systolic murmur at the RUSB, no rubs or gallops RESPIRATORY:  Clear to auscultation without rales, wheezing or rhonchi  ABDOMEN: Soft, non-tender, non-distended MUSCULOSKELETAL:  no edema SKIN: Warm and dry NEUROLOGIC:  Alert and oriented x 3 PSYCHIATRIC:  Normal affect   ASSESSMENT:    1. PVC (premature ventricular contraction)   2. SVT (supraventricular tachycardia) ([place])   3. Coronary artery disease involving native coronary artery of native heart without angina pectoris   4. Essential (primary) hypertension   5. Ascending aorta dilatation (HCC)   6. Hyperlipidemia LDL goal <70     PLAN:    In order of problems listed above:  #PVCs/brief SVT Recent monitor with 11.3% PVC burden. TTE with normal LVEF, no significant valve disease, normal RV. Occasional palpitations but overall not very symptomatic. Doing well on metop. -Continue metop 25mg  XL daily    #Nonobstructive CAD Coronary CTA 02/2020 with 25-50% RCA, 25-50% LM, 50-70% pLAD, 50-70%mLAD, <25% LCx. FFR negative. No anginal symptoms.  -Continue ASA 81mg  daily -Continue home Crestor 40mg  daily and Zetia 10mg  daily  #HLD: -Continue crestor 40mg  daily and zetia 10mg  daily -Lipids at next visit  #LE Edema: Improved.  -Continue HCTZ 25mg  daily  #Mechanical fall with left hip fracture: S/p ORIF. Doing well and working with PT. -Follow-up with ortho as scheduled  #HTN: Well controlled. -Continue amlodipine-benzapril 5mg -40mg  daily -Continue HCTZ 25mg  daily  #Ascending aortic dilation: Stable on TTE in 06/2021 measuring 41mm. -Serial monitoring  Medication Adjustments/Labs and Tests Ordered: Current medicines are reviewed at length with the patient today.  Concerns regarding medicines are outlined above.  No orders of the defined types were placed in this encounter.  No orders of the defined types were placed in this encounter.   Patient Instructions  Medication Instructions:   Your physician recommends that you continue on your current medications as directed. Please refer to the Current Medication list given to you today.  *If you need a refill on your cardiac medications before your next appointment, please call your pharmacy*   Follow-Up: At [facility], you and your health needs are our priority.  As part of our continuing mission to provide you with exceptional heart care, we have created designated Provider Care Teams.  These Care Teams include your primary Cardiologist (physician) and Advanced Practice Providers (APPs -  Physician Assistants and Nurse Practitioners) who all work together to provide you with the care you need, when you need it.  We recommend signing up for the patient portal called "MyChart".  Sign up information is provided on this After Visit Summary.  MyChart is used to connect with patients for Virtual Visits (Telemedicine).  Patients are  able to view lab/test results, encounter notes, upcoming appointments, etc.  Non-urgent messages can be sent to your provider as well.   To learn more about what you can do with MyChart, go to [email or web address].    Your next appointment:   6 month(s)  The format for your next appointment:   In Person  Provider:   Freada Bergeron, MD {    Wilhemina Bonito as a scribe for Freada Bergeron, MD.,have documented all relevant documentation on the behalf of Freada Bergeron, MD,as directed by  Freada Bergeron, MD while in the presence of Freada Bergeron, MD.  I, Freada Bergeron, MD, have reviewed all documentation for this visit. The documentation on 11/10/21 for the exam, diagnosis, procedures, and orders are all accurate and complete.   Signed, Freada Bergeron, MD  11/10/2021 11:45 AM    [place]

## 2021-11-10 NOTE — Patient Instructions (Signed)
Medication Instructions:   Your physician recommends that you continue on your current medications as directed. Please refer to the Current Medication list given to you today.  *If you need a refill on your cardiac medications before your next appointment, please call your pharmacy*   Follow-Up: At [facility], you and your health needs are our priority.  As part of our continuing mission to provide you with exceptional heart care, we have created designated Provider Care Teams.  These Care Teams include your primary Cardiologist (physician) and Advanced Practice Providers (APPs -  Physician Assistants and Nurse Practitioners) who all work together to provide you with the care you need, when you need it.  We recommend signing up for the patient portal called "MyChart".  Sign up information is provided on this After Visit Summary.  MyChart is used to connect with patients for Virtual Visits (Telemedicine).  Patients are able to view lab/test results, encounter notes, upcoming appointments, etc.  Non-urgent messages can be sent to your provider as well.   To learn more about what you can do with MyChart, go to [email or web address].    Your next appointment:   6 month(s)  The format for your next appointment:   In Person  Provider:   Freada Bergeron, MD {

## 2021-11-18 DIAGNOSIS — M545 Low back pain, unspecified: Secondary | ICD-10-CM | POA: Diagnosis not present

## 2021-11-25 DIAGNOSIS — M545 Low back pain, unspecified: Secondary | ICD-10-CM | POA: Diagnosis not present

## 2021-11-30 ENCOUNTER — Other Ambulatory Visit: Payer: Self-pay

## 2021-11-30 DIAGNOSIS — E782 Mixed hyperlipidemia: Secondary | ICD-10-CM

## 2021-11-30 DIAGNOSIS — M545 Low back pain, unspecified: Secondary | ICD-10-CM | POA: Diagnosis not present

## 2021-11-30 DIAGNOSIS — E7800 Pure hypercholesterolemia, unspecified: Secondary | ICD-10-CM

## 2021-11-30 DIAGNOSIS — Z79899 Other long term (current) drug therapy: Secondary | ICD-10-CM

## 2021-11-30 DIAGNOSIS — I10 Essential (primary) hypertension: Secondary | ICD-10-CM

## 2021-11-30 DIAGNOSIS — I2510 Atherosclerotic heart disease of native coronary artery without angina pectoris: Secondary | ICD-10-CM

## 2021-11-30 MED ORDER — EZETIMIBE 10 MG PO TABS: 10.0000 mg | ORAL_TABLET | Freq: Every day | ORAL | 3 refills | Status: DC

## 2021-11-30 MED ORDER — ROSUVASTATIN CALCIUM 40 MG PO TABS: 40.0000 mg | ORAL_TABLET | Freq: Every day | ORAL | 3 refills | Status: DC

## 2021-11-30 MED ORDER — POTASSIUM CHLORIDE CRYS ER 20 MEQ PO TBCR: 20.0000 meq | EXTENDED_RELEASE_TABLET | Freq: Every day | ORAL | 3 refills | Status: DC

## 2021-12-01 DIAGNOSIS — C50911 Malignant neoplasm of unspecified site of right female breast: Secondary | ICD-10-CM | POA: Diagnosis not present

## 2021-12-07 DIAGNOSIS — M545 Low back pain, unspecified: Secondary | ICD-10-CM | POA: Diagnosis not present

## 2021-12-11 DIAGNOSIS — N898 Other specified noninflammatory disorders of vagina: Secondary | ICD-10-CM | POA: Diagnosis not present

## 2021-12-11 DIAGNOSIS — N952 Postmenopausal atrophic vaginitis: Secondary | ICD-10-CM | POA: Diagnosis not present

## 2021-12-17 DIAGNOSIS — C50911 Malignant neoplasm of unspecified site of right female breast: Secondary | ICD-10-CM | POA: Diagnosis not present

## 2021-12-29 ENCOUNTER — Other Ambulatory Visit: Payer: Self-pay

## 2021-12-29 ENCOUNTER — Telehealth: Payer: Self-pay | Admitting: Cardiology

## 2021-12-29 DIAGNOSIS — Z79899 Other long term (current) drug therapy: Secondary | ICD-10-CM

## 2021-12-29 DIAGNOSIS — Z789 Other specified health status: Secondary | ICD-10-CM

## 2021-12-29 DIAGNOSIS — E785 Hyperlipidemia, unspecified: Secondary | ICD-10-CM

## 2021-12-29 DIAGNOSIS — E7800 Pure hypercholesterolemia, unspecified: Secondary | ICD-10-CM

## 2021-12-29 DIAGNOSIS — I2510 Atherosclerotic heart disease of native coronary artery without angina pectoris: Secondary | ICD-10-CM

## 2021-12-29 MED ORDER — ROSUVASTATIN CALCIUM 20 MG PO TABS: 20.0000 mg | ORAL_TABLET | Freq: Every day | ORAL | 1 refills | Status: DC

## 2021-12-29 NOTE — Telephone Encounter (Signed)
Pt will see PharmD in lipid clinic on 01/13/22 at 2:30 pm. ?Pt made aware of appt date and time by scheduling. ?

## 2021-12-29 NOTE — Addendum Note (Signed)
Addended by: Nuala Alpha on: 12/29/2021 01:33 PM   Modules accepted: Orders

## 2021-12-29 NOTE — Telephone Encounter (Signed)
Patient called to talk with Dr. Johney Frame or nurse.Please call back ?

## 2021-12-29 NOTE — Telephone Encounter (Signed)
Pt aware of recommendations per Dr. Johney Frame. ?Pt aware to decrease her rosuvastatin to 20 mg po daily and see how she handles that.  Pt will come in for repeat lipids in 6-8 weeks on 02/15/22.  She is aware to come fasting to this lab appt.  ?Pt aware that Dr. Johney Frame would like for her to also be referred to our lipid clinic for further management of lipids and statin intolerance.  ?She is also aware that Dr. Johney Frame will touch base with our Pharmacist about weight loss injection, and they will touch base with her about this medication.  ?Referral to lipid clinic placed and pt aware that our [facility] Schedulers will call her soon to arrange this appt.  ?Confirmed the pharmacy of choice with the pt.  Only sent in a month supply of her rosuvastatin 20 mg to her pharmacy, to make sure she tolerates this appropriately. ?Pt verbalized understanding and agrees with this plan. ?

## 2021-12-29 NOTE — Telephone Encounter (Signed)
She can cut the crestor to '20mg'$  for now and see how she does.  I will contact the PharmD for coverage of GLP1 agonist. ? ?Can we repeat cholesterol in 6-8 weeks on the lower dose to make sure she doesn't increase too much?  ?

## 2021-12-29 NOTE — Telephone Encounter (Signed)
Pt is calling in with 2 questions/concerns for Dr. Johney Frame: ? ?1.)  she states ever since she started Rosuvastatin, she wakes up every morning with joint aches and pains. ? ?2.)  pt also states that Dr. Johney Frame mentioned to her about starting a weight loss medication/injection,  and she is interested in this now. ? ? ?Informed the pt that I will route both concerns to Dr. Johney Frame to further review and advise on, and follow-up with the pt accordingly thereafter.  ? ?Pt verbalized understanding and agrees with this plan. ?

## 2021-12-31 ENCOUNTER — Other Ambulatory Visit: Payer: Self-pay

## 2021-12-31 ENCOUNTER — Other Ambulatory Visit: Payer: PPO

## 2021-12-31 DIAGNOSIS — E7800 Pure hypercholesterolemia, unspecified: Secondary | ICD-10-CM

## 2021-12-31 LAB — LIPID PANEL
Chol/HDL Ratio: 2.5 ratio (ref 0.0–4.4)
Cholesterol, Total: 123 mg/dL (ref 100–199)
HDL: 49 mg/dL (ref >39)
LDL Chol Calc (NIH): 57 mg/dL (ref 0–99)
Triglycerides: 91 mg/dL (ref 0–149)
VLDL Cholesterol Cal: 17 mg/dL (ref 5–40)

## 2022-01-05 DIAGNOSIS — I10 Essential (primary) hypertension: Secondary | ICD-10-CM | POA: Diagnosis not present

## 2022-01-05 DIAGNOSIS — E1169 Type 2 diabetes mellitus with other specified complication: Secondary | ICD-10-CM | POA: Diagnosis not present

## 2022-01-13 ENCOUNTER — Ambulatory Visit (INDEPENDENT_AMBULATORY_CARE_PROVIDER_SITE_OTHER): Payer: PPO | Admitting: Pharmacist

## 2022-01-13 ENCOUNTER — Encounter: Payer: Self-pay | Admitting: Pharmacist

## 2022-01-13 VITALS — BP 129/75 | HR 60 | Wt 180.2 lb

## 2022-01-13 DIAGNOSIS — E119 Type 2 diabetes mellitus without complications: Secondary | ICD-10-CM | POA: Insufficient documentation

## 2022-01-13 MED ORDER — OZEMPIC (0.25 OR 0.5 MG/DOSE) 2 MG/3ML ~~LOC~~ SOPN: PEN_INJECTOR | SUBCUTANEOUS | 1 refills | Status: DC

## 2022-01-13 NOTE — Patient Instructions (Addendum)
It was nice meeting you today ? ?We would like your A1c to stay less than 7% ? ?We will start you on a new medication called Ozempic.  Your dose will be 0.'25mg'$  once a week for 4 weeks.  After 4 weeks you will increase your dose to 0.'5mg'$  once weekly ? ?Please call with any questions ? ?[name], PharmD, BCACP, [place], CPP ?[address], [address] ?[place], [place], [address] ?Phone: [phone], Fax: [phone]  ?

## 2022-01-13 NOTE — Telephone Encounter (Signed)
This encounter was created in error - please disregard.

## 2022-01-13 NOTE — Progress Notes (Signed)
Patient ID: Andrea Farmer                 DOB: 12/24/1940                    MRN: [id number] ? ? ? ? ?HPI: ?Andrea Farmer is a 81 y.o. female patient referred to pharmacy clinic by Dr Johney Frame to initiate weight loss therapy with GLP1-RA. PMH is significant for obesity, controlled T2DM, breast cancer, CAD, HLD, and HTN. Most recent BMI 30.93. ? ?Patient presents today in good spirits. Referred for cholesterol management, however recent LDL was 57 on rosuvastatin '20mg'$  and Zetia '10mg'$ . Was unable to tolerate rosuvastatin '40mg'$ .    ? ?DM currently managed with metformin ER. Checks BG twice weekly.  ? ? ?Labs: ?Lab Results  ?Component Value Date  ? HGBA1C 7.0 (H) 06/08/2018  ? ? ?Wt Readings from Last 1 Encounters:  ?11/10/21 174 lb 12.8 oz (79.3 kg)  ? ? ?BP Readings from Last 1 Encounters:  ?11/10/21 (!) 142/82  ? ?Pulse Readings from Last 1 Encounters:  ?11/10/21 80  ? ? ?   ?Component Value Date/Time  ? CHOL 123 12/31/2021 1001  ? CHOL 164 05/20/2016 1127  ? TRIG 91 12/31/2021 1001  ? TRIG 88 05/20/2016 1127  ? HDL 49 12/31/2021 1001  ? HDL 55 05/20/2016 1127  ? CHOLHDL 2.5 12/31/2021 1001  ? CHOLHDL 3.0 05/20/2016 1127  ? LDLCALC 57 12/31/2021 1001  ? [name] 91 05/20/2016 1127  ? ? ?Past Medical History:  ?Diagnosis Date  ? Acid reflux   ? Back pain   ? arthritis  ? Cataract   ? immature but doesn't know which eye  ? Diabetes mellitus without complication ([place])   ? High cholesterol   ? takes Crestor daily  ? History of colon polyps   ? benign  ? History of migraine   ? Hypertension   ? takes Lotrel and HCTZ dailydaily  ? Irritable bowel syndrome 03/11/2016  ? Joint pain   ? Joint swelling   ? Osteoarthritis of knee 03/11/2016  ? Pneumonia   ? 30 plus yrs ago  ? ? ?Current Outpatient Medications on File Prior to Visit  ?Medication Sig Dispense Refill  ? acetaminophen (TYLENOL) 500 MG tablet Take 1,000 mg by mouth every 6 (six) hours as needed for moderate pain.    ? amLODipine-benazepril (LOTREL) 5-40 MG capsule Take 1  capsule by mouth daily.    ? aspirin EC 81 MG tablet Take 1 tablet (81 mg total) by mouth every other day. Swallow whole. 90 tablet 3  ? Calcium Carbonate-Vitamin D 600-400 MG-UNIT tablet Take 1 tablet by mouth 3 (three) times a week.     ? Coenzyme Q10 (CO Q 10 PO) Take 200 mg by mouth every other day.     ? ezetimibe (ZETIA) 10 MG tablet Take 1 tablet (10 mg total) by mouth daily. 90 tablet 3  ? furosemide (LASIX) 20 MG tablet Take 1 tablet (20 mg total) by mouth as needed for fluid or edema. 30 tablet 1  ? hydrochlorothiazide (HYDRODIURIL) 25 MG tablet Take 25 mg by mouth daily.    ? Hyprom-Naphaz-Polysorb-Zn Sulf (CLEAR EYES COMPLETE OP) Place 1 drop into both eyes daily as needed (dry eyes).    ? letrozole (FEMARA) 2.5 MG tablet Take 1 tablet (2.5 mg total) by mouth daily. 90 tablet 3  ? metFORMIN (GLUCOPHAGE) 500 MG tablet Take 500 mg by mouth daily.    ?  mometasone (NASONEX) 50 MCG/ACT nasal spray Place 2 sprays into the nose 3 (three) times a week.    ? Multiple Vitamin (MULTIVITAMIN WITH MINERALS) TABS tablet Take 1 tablet by mouth 3 (three) times a week.     ? Omega-3 Fatty Acids (OMEGA 3 PO) Take 1 capsule by mouth 3 (three) times a week.     ? polyethylene glycol (MIRALAX / GLYCOLAX) 17 g packet Take 17 g by mouth daily.    ? potassium chloride SA (KLOR-CON M) 20 MEQ tablet Take 1 tablet (20 mEq total) by mouth daily. 90 tablet 3  ? Probiotic Product (ALIGN) 4 MG CAPS Take 4 mg by mouth daily.    ? psyllium (HYDROCIL/METAMUCIL) 95 % PACK Take 1 packet by mouth daily.     ? rosuvastatin (CRESTOR) 20 MG tablet Take 1 tablet (20 mg total) by mouth daily. 30 tablet 1  ? TURMERIC PO Take 500 mg by mouth every other day.    ? vitamin E 400 UNIT capsule Take 400 Units by mouth every other day.    ? ?No current facility-administered medications on file prior to visit.  ? ? ?Allergies  ?Allergen Reactions  ? Tramadol   ?  Other reaction(s): itching  ? ? ? ?Assessment/Plan: ? ?1. Weight loss/T2DM: Due to patient's  T2DM and weight gain, discussed healthy eating with patient.  Recommended starting Ozempic. Using demo pen, educated patient on storage, mechanism of action, priming pen, attaching needle, dialing dose, and administration.  Patient voiced understanding.  Will start Ozempic 0.'25mg'$  sq weekly for 4 weeks and then increase to 0.'5mg'$  weekly. Confirmed patient has no personal or family history of medullary thyroid carcinoma (MTC) or Multiple Endocrine Neoplasia syndrome type 2 (MEN 2).  ? ?Advised patient on common side effects including nausea, diarrhea, dyspepsia, decreased appetite, and fatigue. Counseled patient on reducing meal size and how to titrate medication to minimize side effects. Counseled patient to call if intolerable side effects or if experiencing dehydration, abdominal pain, or dizziness. Patient will adhere to dietary modifications and will target at least 150 minutes of moderate intensity exercise weekly.  ? ?[name], PharmD, BCACP, [place], CPP ?[address], [address] ?[place], [place], [address] ?Phone: [phone], Fax: [phone]  ?

## 2022-01-29 DIAGNOSIS — E119 Type 2 diabetes mellitus without complications: Secondary | ICD-10-CM | POA: Diagnosis not present

## 2022-01-29 DIAGNOSIS — I10 Essential (primary) hypertension: Secondary | ICD-10-CM | POA: Diagnosis not present

## 2022-01-29 DIAGNOSIS — E785 Hyperlipidemia, unspecified: Secondary | ICD-10-CM | POA: Diagnosis not present

## 2022-02-03 DIAGNOSIS — M545 Low back pain, unspecified: Secondary | ICD-10-CM | POA: Diagnosis not present

## 2022-02-03 DIAGNOSIS — M179 Osteoarthritis of knee, unspecified: Secondary | ICD-10-CM | POA: Diagnosis not present

## 2022-02-03 DIAGNOSIS — E1169 Type 2 diabetes mellitus with other specified complication: Secondary | ICD-10-CM | POA: Diagnosis not present

## 2022-02-08 ENCOUNTER — Other Ambulatory Visit: Payer: Self-pay | Admitting: Hematology and Oncology

## 2022-02-08 ENCOUNTER — Ambulatory Visit
Admission: RE | Admit: 2022-02-08 | Discharge: 2022-02-08 | Disposition: A | Discharge status: Home / Self Care | Payer: PPO | Source: Ambulatory Visit | Attending: Family Medicine | Admitting: Family Medicine

## 2022-02-08 DIAGNOSIS — Z853 Personal history of malignant neoplasm of breast: Secondary | ICD-10-CM

## 2022-02-08 DIAGNOSIS — R928 Other abnormal and inconclusive findings on diagnostic imaging of breast: Secondary | ICD-10-CM | POA: Diagnosis not present

## 2022-02-12 ENCOUNTER — Other Ambulatory Visit: Payer: Self-pay | Admitting: Family Medicine

## 2022-02-12 DIAGNOSIS — Z1231 Encounter for screening mammogram for malignant neoplasm of breast: Secondary | ICD-10-CM

## 2022-02-16 ENCOUNTER — Other Ambulatory Visit: Payer: PPO | Admitting: *Deleted

## 2022-02-16 DIAGNOSIS — M545 Low back pain, unspecified: Secondary | ICD-10-CM | POA: Diagnosis not present

## 2022-02-16 DIAGNOSIS — I2510 Atherosclerotic heart disease of native coronary artery without angina pectoris: Secondary | ICD-10-CM

## 2022-02-16 DIAGNOSIS — E785 Hyperlipidemia, unspecified: Secondary | ICD-10-CM

## 2022-02-16 DIAGNOSIS — M5136 Other intervertebral disc degeneration, lumbar region: Secondary | ICD-10-CM | POA: Diagnosis not present

## 2022-02-16 DIAGNOSIS — Z79899 Other long term (current) drug therapy: Secondary | ICD-10-CM

## 2022-02-16 DIAGNOSIS — Z789 Other specified health status: Secondary | ICD-10-CM | POA: Diagnosis not present

## 2022-02-16 DIAGNOSIS — M4126 Other idiopathic scoliosis, lumbar region: Secondary | ICD-10-CM | POA: Diagnosis not present

## 2022-02-16 DIAGNOSIS — M9903 Segmental and somatic dysfunction of lumbar region: Secondary | ICD-10-CM | POA: Diagnosis not present

## 2022-02-16 DIAGNOSIS — M4316 Spondylolisthesis, lumbar region: Secondary | ICD-10-CM | POA: Diagnosis not present

## 2022-02-16 LAB — LIPID PANEL
Chol/HDL Ratio: 2.6 ratio (ref 0.0–4.4)
Cholesterol, Total: 134 mg/dL (ref 100–199)
HDL: 51 mg/dL (ref >39)
LDL Chol Calc (NIH): 64 mg/dL (ref 0–99)
Triglycerides: 101 mg/dL (ref 0–149)
VLDL Cholesterol Cal: 19 mg/dL (ref 5–40)

## 2022-03-01 ENCOUNTER — Other Ambulatory Visit: Payer: Self-pay | Admitting: *Deleted

## 2022-03-01 DIAGNOSIS — I2510 Atherosclerotic heart disease of native coronary artery without angina pectoris: Secondary | ICD-10-CM

## 2022-03-01 DIAGNOSIS — Z79899 Other long term (current) drug therapy: Secondary | ICD-10-CM

## 2022-03-01 DIAGNOSIS — Z789 Other specified health status: Secondary | ICD-10-CM

## 2022-03-01 DIAGNOSIS — E785 Hyperlipidemia, unspecified: Secondary | ICD-10-CM

## 2022-03-01 MED ORDER — ROSUVASTATIN CALCIUM 20 MG PO TABS: 20.0000 mg | ORAL_TABLET | Freq: Every day | ORAL | 1 refills | Status: DC

## 2022-03-02 DIAGNOSIS — M5136 Other intervertebral disc degeneration, lumbar region: Secondary | ICD-10-CM | POA: Diagnosis not present

## 2022-03-02 DIAGNOSIS — M545 Low back pain, unspecified: Secondary | ICD-10-CM | POA: Diagnosis not present

## 2022-03-02 DIAGNOSIS — M4126 Other idiopathic scoliosis, lumbar region: Secondary | ICD-10-CM | POA: Diagnosis not present

## 2022-03-02 DIAGNOSIS — M9903 Segmental and somatic dysfunction of lumbar region: Secondary | ICD-10-CM | POA: Diagnosis not present

## 2022-03-02 DIAGNOSIS — M4316 Spondylolisthesis, lumbar region: Secondary | ICD-10-CM | POA: Diagnosis not present

## 2022-03-04 ENCOUNTER — Ambulatory Visit
Admission: RE | Admit: 2022-03-04 | Discharge: 2022-03-04 | Disposition: A | Discharge status: Home / Self Care | Payer: PPO | Source: Ambulatory Visit | Attending: Hematology and Oncology | Admitting: Hematology and Oncology

## 2022-03-04 DIAGNOSIS — M5136 Other intervertebral disc degeneration, lumbar region: Secondary | ICD-10-CM | POA: Diagnosis not present

## 2022-03-04 DIAGNOSIS — M545 Low back pain, unspecified: Secondary | ICD-10-CM | POA: Diagnosis not present

## 2022-03-04 DIAGNOSIS — M8588 Other specified disorders of bone density and structure, other site: Secondary | ICD-10-CM

## 2022-03-04 DIAGNOSIS — M4126 Other idiopathic scoliosis, lumbar region: Secondary | ICD-10-CM | POA: Diagnosis not present

## 2022-03-04 DIAGNOSIS — M8589 Other specified disorders of bone density and structure, multiple sites: Secondary | ICD-10-CM | POA: Diagnosis not present

## 2022-03-04 DIAGNOSIS — Z780 Asymptomatic menopausal state: Secondary | ICD-10-CM | POA: Diagnosis not present

## 2022-03-04 DIAGNOSIS — M4316 Spondylolisthesis, lumbar region: Secondary | ICD-10-CM | POA: Diagnosis not present

## 2022-03-04 DIAGNOSIS — M9903 Segmental and somatic dysfunction of lumbar region: Secondary | ICD-10-CM | POA: Diagnosis not present

## 2022-03-09 DIAGNOSIS — M5136 Other intervertebral disc degeneration, lumbar region: Secondary | ICD-10-CM | POA: Diagnosis not present

## 2022-03-09 DIAGNOSIS — M545 Low back pain, unspecified: Secondary | ICD-10-CM | POA: Diagnosis not present

## 2022-03-09 DIAGNOSIS — M9903 Segmental and somatic dysfunction of lumbar region: Secondary | ICD-10-CM | POA: Diagnosis not present

## 2022-03-09 DIAGNOSIS — M4316 Spondylolisthesis, lumbar region: Secondary | ICD-10-CM | POA: Diagnosis not present

## 2022-03-09 DIAGNOSIS — M4126 Other idiopathic scoliosis, lumbar region: Secondary | ICD-10-CM | POA: Diagnosis not present

## 2022-03-11 DIAGNOSIS — M5136 Other intervertebral disc degeneration, lumbar region: Secondary | ICD-10-CM | POA: Diagnosis not present

## 2022-03-11 DIAGNOSIS — M9903 Segmental and somatic dysfunction of lumbar region: Secondary | ICD-10-CM | POA: Diagnosis not present

## 2022-03-11 DIAGNOSIS — M545 Low back pain, unspecified: Secondary | ICD-10-CM | POA: Diagnosis not present

## 2022-03-11 DIAGNOSIS — M4126 Other idiopathic scoliosis, lumbar region: Secondary | ICD-10-CM | POA: Diagnosis not present

## 2022-03-11 DIAGNOSIS — M4316 Spondylolisthesis, lumbar region: Secondary | ICD-10-CM | POA: Diagnosis not present

## 2022-03-15 ENCOUNTER — Telehealth: Payer: Self-pay | Admitting: Pharmacist

## 2022-03-15 ENCOUNTER — Other Ambulatory Visit: Payer: Self-pay | Admitting: *Deleted

## 2022-03-15 DIAGNOSIS — E119 Type 2 diabetes mellitus without complications: Secondary | ICD-10-CM

## 2022-03-15 DIAGNOSIS — M4316 Spondylolisthesis, lumbar region: Secondary | ICD-10-CM | POA: Diagnosis not present

## 2022-03-15 DIAGNOSIS — M5136 Other intervertebral disc degeneration, lumbar region: Secondary | ICD-10-CM | POA: Diagnosis not present

## 2022-03-15 DIAGNOSIS — M4126 Other idiopathic scoliosis, lumbar region: Secondary | ICD-10-CM | POA: Diagnosis not present

## 2022-03-15 DIAGNOSIS — M9903 Segmental and somatic dysfunction of lumbar region: Secondary | ICD-10-CM | POA: Diagnosis not present

## 2022-03-15 DIAGNOSIS — M545 Low back pain, unspecified: Secondary | ICD-10-CM | POA: Diagnosis not present

## 2022-03-15 MED ORDER — OZEMPIC (2 MG/DOSE) 8 MG/3ML ~~LOC~~ SOPN: 2.0000 mg | PEN_INJECTOR | SUBCUTANEOUS | 2 refills | Status: DC

## 2022-03-15 MED ORDER — OZEMPIC (1 MG/DOSE) 4 MG/3ML ~~LOC~~ SOPN: 1.0000 mg | PEN_INJECTOR | SUBCUTANEOUS | 2 refills | Status: DC

## 2022-03-15 NOTE — Telephone Encounter (Signed)
Received refill request for Ozempic 0.'5mg'$ .  Contact patient. Has been tolerating 0.'5mg'$  but only lost a few pounds of weight. Will increase to 1.'0mg'$  and add refills.  Will also send in [place] 2.'0mg'$  with refills for patient to increase to when she is ready.

## 2022-03-17 ENCOUNTER — Telehealth: Payer: Self-pay | Admitting: Hematology and Oncology

## 2022-03-17 DIAGNOSIS — M5136 Other intervertebral disc degeneration, lumbar region: Secondary | ICD-10-CM | POA: Diagnosis not present

## 2022-03-17 DIAGNOSIS — M545 Low back pain, unspecified: Secondary | ICD-10-CM | POA: Diagnosis not present

## 2022-03-17 DIAGNOSIS — M9903 Segmental and somatic dysfunction of lumbar region: Secondary | ICD-10-CM | POA: Diagnosis not present

## 2022-03-17 DIAGNOSIS — M4126 Other idiopathic scoliosis, lumbar region: Secondary | ICD-10-CM | POA: Diagnosis not present

## 2022-03-17 DIAGNOSIS — M4316 Spondylolisthesis, lumbar region: Secondary | ICD-10-CM | POA: Diagnosis not present

## 2022-03-17 NOTE — Telephone Encounter (Signed)
Rescheduled appointment per provider PAL. Left message. 

## 2022-03-22 DIAGNOSIS — M545 Low back pain, unspecified: Secondary | ICD-10-CM | POA: Diagnosis not present

## 2022-03-22 DIAGNOSIS — M4316 Spondylolisthesis, lumbar region: Secondary | ICD-10-CM | POA: Diagnosis not present

## 2022-03-22 DIAGNOSIS — M9903 Segmental and somatic dysfunction of lumbar region: Secondary | ICD-10-CM | POA: Diagnosis not present

## 2022-03-22 DIAGNOSIS — M5136 Other intervertebral disc degeneration, lumbar region: Secondary | ICD-10-CM | POA: Diagnosis not present

## 2022-03-22 DIAGNOSIS — M4126 Other idiopathic scoliosis, lumbar region: Secondary | ICD-10-CM | POA: Diagnosis not present

## 2022-03-23 ENCOUNTER — Other Ambulatory Visit: Payer: Self-pay

## 2022-03-23 DIAGNOSIS — Z789 Other specified health status: Secondary | ICD-10-CM

## 2022-03-23 DIAGNOSIS — I2510 Atherosclerotic heart disease of native coronary artery without angina pectoris: Secondary | ICD-10-CM

## 2022-03-23 DIAGNOSIS — Z79899 Other long term (current) drug therapy: Secondary | ICD-10-CM

## 2022-03-23 DIAGNOSIS — E785 Hyperlipidemia, unspecified: Secondary | ICD-10-CM

## 2022-03-23 MED ORDER — ROSUVASTATIN CALCIUM 20 MG PO TABS: 20.0000 mg | ORAL_TABLET | Freq: Every day | ORAL | 5 refills | Status: DC

## 2022-03-24 DIAGNOSIS — M5136 Other intervertebral disc degeneration, lumbar region: Secondary | ICD-10-CM | POA: Diagnosis not present

## 2022-03-24 DIAGNOSIS — M545 Low back pain, unspecified: Secondary | ICD-10-CM | POA: Diagnosis not present

## 2022-03-24 DIAGNOSIS — M4316 Spondylolisthesis, lumbar region: Secondary | ICD-10-CM | POA: Diagnosis not present

## 2022-03-24 DIAGNOSIS — M9903 Segmental and somatic dysfunction of lumbar region: Secondary | ICD-10-CM | POA: Diagnosis not present

## 2022-03-24 DIAGNOSIS — M4126 Other idiopathic scoliosis, lumbar region: Secondary | ICD-10-CM | POA: Diagnosis not present

## 2022-03-30 DIAGNOSIS — M545 Low back pain, unspecified: Secondary | ICD-10-CM | POA: Diagnosis not present

## 2022-03-30 DIAGNOSIS — M9903 Segmental and somatic dysfunction of lumbar region: Secondary | ICD-10-CM | POA: Diagnosis not present

## 2022-03-30 DIAGNOSIS — M50321 Other cervical disc degeneration at C4-C5 level: Secondary | ICD-10-CM | POA: Diagnosis not present

## 2022-03-30 DIAGNOSIS — M9901 Segmental and somatic dysfunction of cervical region: Secondary | ICD-10-CM | POA: Diagnosis not present

## 2022-03-30 DIAGNOSIS — M4316 Spondylolisthesis, lumbar region: Secondary | ICD-10-CM | POA: Diagnosis not present

## 2022-03-30 DIAGNOSIS — M5136 Other intervertebral disc degeneration, lumbar region: Secondary | ICD-10-CM | POA: Diagnosis not present

## 2022-03-30 DIAGNOSIS — M4126 Other idiopathic scoliosis, lumbar region: Secondary | ICD-10-CM | POA: Diagnosis not present

## 2022-03-30 DIAGNOSIS — M50322 Other cervical disc degeneration at C5-C6 level: Secondary | ICD-10-CM | POA: Diagnosis not present

## 2022-04-01 DIAGNOSIS — M4126 Other idiopathic scoliosis, lumbar region: Secondary | ICD-10-CM | POA: Diagnosis not present

## 2022-04-01 DIAGNOSIS — M5136 Other intervertebral disc degeneration, lumbar region: Secondary | ICD-10-CM | POA: Diagnosis not present

## 2022-04-01 DIAGNOSIS — M9901 Segmental and somatic dysfunction of cervical region: Secondary | ICD-10-CM | POA: Diagnosis not present

## 2022-04-01 DIAGNOSIS — M50321 Other cervical disc degeneration at C4-C5 level: Secondary | ICD-10-CM | POA: Diagnosis not present

## 2022-04-01 DIAGNOSIS — M4316 Spondylolisthesis, lumbar region: Secondary | ICD-10-CM | POA: Diagnosis not present

## 2022-04-01 DIAGNOSIS — M50322 Other cervical disc degeneration at C5-C6 level: Secondary | ICD-10-CM | POA: Diagnosis not present

## 2022-04-01 DIAGNOSIS — M9903 Segmental and somatic dysfunction of lumbar region: Secondary | ICD-10-CM | POA: Diagnosis not present

## 2022-04-01 DIAGNOSIS — M545 Low back pain, unspecified: Secondary | ICD-10-CM | POA: Diagnosis not present

## 2022-04-04 ENCOUNTER — Other Ambulatory Visit: Payer: Self-pay | Admitting: Hematology and Oncology

## 2022-04-06 DIAGNOSIS — M545 Low back pain, unspecified: Secondary | ICD-10-CM | POA: Diagnosis not present

## 2022-04-06 DIAGNOSIS — M4316 Spondylolisthesis, lumbar region: Secondary | ICD-10-CM | POA: Diagnosis not present

## 2022-04-06 DIAGNOSIS — M9903 Segmental and somatic dysfunction of lumbar region: Secondary | ICD-10-CM | POA: Diagnosis not present

## 2022-04-06 DIAGNOSIS — M50322 Other cervical disc degeneration at C5-C6 level: Secondary | ICD-10-CM | POA: Diagnosis not present

## 2022-04-06 DIAGNOSIS — M5136 Other intervertebral disc degeneration, lumbar region: Secondary | ICD-10-CM | POA: Diagnosis not present

## 2022-04-06 DIAGNOSIS — M9901 Segmental and somatic dysfunction of cervical region: Secondary | ICD-10-CM | POA: Diagnosis not present

## 2022-04-06 DIAGNOSIS — M50321 Other cervical disc degeneration at C4-C5 level: Secondary | ICD-10-CM | POA: Diagnosis not present

## 2022-04-06 DIAGNOSIS — M4126 Other idiopathic scoliosis, lumbar region: Secondary | ICD-10-CM | POA: Diagnosis not present

## 2022-04-12 ENCOUNTER — Ambulatory Visit: Payer: PPO | Admitting: Hematology and Oncology

## 2022-04-15 DIAGNOSIS — M50321 Other cervical disc degeneration at C4-C5 level: Secondary | ICD-10-CM | POA: Diagnosis not present

## 2022-04-15 DIAGNOSIS — M4316 Spondylolisthesis, lumbar region: Secondary | ICD-10-CM | POA: Diagnosis not present

## 2022-04-15 DIAGNOSIS — M50322 Other cervical disc degeneration at C5-C6 level: Secondary | ICD-10-CM | POA: Diagnosis not present

## 2022-04-15 DIAGNOSIS — M9901 Segmental and somatic dysfunction of cervical region: Secondary | ICD-10-CM | POA: Diagnosis not present

## 2022-04-15 DIAGNOSIS — M5136 Other intervertebral disc degeneration, lumbar region: Secondary | ICD-10-CM | POA: Diagnosis not present

## 2022-04-15 DIAGNOSIS — M4126 Other idiopathic scoliosis, lumbar region: Secondary | ICD-10-CM | POA: Diagnosis not present

## 2022-04-15 DIAGNOSIS — M9903 Segmental and somatic dysfunction of lumbar region: Secondary | ICD-10-CM | POA: Diagnosis not present

## 2022-04-15 DIAGNOSIS — M545 Low back pain, unspecified: Secondary | ICD-10-CM | POA: Diagnosis not present

## 2022-04-20 DIAGNOSIS — M9903 Segmental and somatic dysfunction of lumbar region: Secondary | ICD-10-CM | POA: Diagnosis not present

## 2022-04-20 DIAGNOSIS — M5136 Other intervertebral disc degeneration, lumbar region: Secondary | ICD-10-CM | POA: Diagnosis not present

## 2022-04-20 DIAGNOSIS — M9901 Segmental and somatic dysfunction of cervical region: Secondary | ICD-10-CM | POA: Diagnosis not present

## 2022-04-20 DIAGNOSIS — M545 Low back pain, unspecified: Secondary | ICD-10-CM | POA: Diagnosis not present

## 2022-04-20 DIAGNOSIS — M50321 Other cervical disc degeneration at C4-C5 level: Secondary | ICD-10-CM | POA: Diagnosis not present

## 2022-04-20 DIAGNOSIS — M4316 Spondylolisthesis, lumbar region: Secondary | ICD-10-CM | POA: Diagnosis not present

## 2022-04-20 DIAGNOSIS — M50322 Other cervical disc degeneration at C5-C6 level: Secondary | ICD-10-CM | POA: Diagnosis not present

## 2022-04-20 DIAGNOSIS — M4126 Other idiopathic scoliosis, lumbar region: Secondary | ICD-10-CM | POA: Diagnosis not present

## 2022-04-27 DIAGNOSIS — M545 Low back pain, unspecified: Secondary | ICD-10-CM | POA: Diagnosis not present

## 2022-04-27 DIAGNOSIS — M9901 Segmental and somatic dysfunction of cervical region: Secondary | ICD-10-CM | POA: Diagnosis not present

## 2022-04-27 DIAGNOSIS — M4316 Spondylolisthesis, lumbar region: Secondary | ICD-10-CM | POA: Diagnosis not present

## 2022-04-27 DIAGNOSIS — M50321 Other cervical disc degeneration at C4-C5 level: Secondary | ICD-10-CM | POA: Diagnosis not present

## 2022-04-27 DIAGNOSIS — M50322 Other cervical disc degeneration at C5-C6 level: Secondary | ICD-10-CM | POA: Diagnosis not present

## 2022-04-27 DIAGNOSIS — M9903 Segmental and somatic dysfunction of lumbar region: Secondary | ICD-10-CM | POA: Diagnosis not present

## 2022-04-27 DIAGNOSIS — M4126 Other idiopathic scoliosis, lumbar region: Secondary | ICD-10-CM | POA: Diagnosis not present

## 2022-04-27 DIAGNOSIS — M5136 Other intervertebral disc degeneration, lumbar region: Secondary | ICD-10-CM | POA: Diagnosis not present

## 2022-04-30 NOTE — Progress Notes (Signed)
Patient Care Team: Lawerance Cruel, MD as PCP - General (Family Medicine) Freada Bergeron, MD as PCP - Cardiology (Cardiology) Nicholas Lose, MD as Consulting Physician (Hematology and Oncology) Jovita Kussmaul, MD as Consulting Physician (General Surgery) Kyung Rudd, MD as Consulting Physician (Radiation Oncology)  DIAGNOSIS: No diagnosis found.  SUMMARY OF ONCOLOGIC HISTORY: Oncology History  Malignant neoplasm of lower-outer quadrant of right breast of female, estrogen receptor positive ([place])  02/15/2020 Initial Diagnosis   Screening mammogram detected right breast mass. 0.4cm mass at the 6:30 position and no axillary adenopathy. Biopsy: invasive ductal carcinoma, grade 3, HER-2 negative (1+), ER+ 95%, PR- 0%, KI67 40%.   03/20/2020 Surgery   Right lumpectomy [name]) [phone]): invasive and in situ ductal carcinoma, 0.5cm, clear margins, 4 right axillary lymph nodes negative    04/21/2020 - 05/16/2020 Radiation Therapy   The patient initially received a dose of 42.56 Gy in 16 fractions to the breast using whole-breast tangent fields. This was delivered using a 3-D conformal technique. The patient then received a boost to the seroma. This delivered an additional 8 Gy in 74factions using a 3 field photon technique due to the depth of the seroma. The total dose was 50.56 Gy.   05/2020 - 05/2025 Anti-estrogen oral therapy   Anastrozole     CHIEF COMPLIANT: Follow up breast cancer on Letrozole  INTERVAL HISTORY: Andrea Farmer a 81y.o. female with above-mentioned history of breast cancer who underwent a right lumpectomy and radiation treatment, and is currently on anti-estrogen therapy. Follow-up on Letrozole.  She presents to the clinic today for a follow-up.   ALLERGIES:  is allergic to tramadol.  MEDICATIONS:  Current Outpatient Medications  Medication Sig Dispense Refill   acetaminophen (TYLENOL) 500 MG tablet Take 1,000 mg by mouth every 6 (six) hours as needed for  moderate pain.     amLODipine-benazepril (LOTREL) 5-40 MG capsule Take 1 capsule by mouth daily.     aspirin EC 81 MG tablet Take 1 tablet (81 mg total) by mouth every other day. Swallow whole. 90 tablet 3   Calcium Carbonate-Vitamin D 600-400 MG-UNIT tablet Take 1 tablet by mouth 3 (three) times a week.      Coenzyme Q10 (CO Q 10 PO) Take 200 mg by mouth every other day.      ezetimibe (ZETIA) 10 MG tablet Take 1 tablet (10 mg total) by mouth daily. 90 tablet 3   hydrochlorothiazide (HYDRODIURIL) 25 MG tablet Take 25 mg by mouth daily.     Hyprom-Naphaz-Polysorb-Zn Sulf (CLEAR EYES COMPLETE OP) Place 1 drop into both eyes daily as needed (dry eyes).     letrozole (FEMARA) 2.5 MG tablet Take 1 tablet (2.5 mg total) by mouth daily. 90 tablet 3   metFORMIN (GLUCOPHAGE-XR) 500 MG 24 hr tablet metformin ER 500 mg tablet,extended release 24 hr     mometasone (NASONEX) 50 MCG/ACT nasal spray Place 2 sprays into the nose 3 (three) times a week.     Multiple Vitamin (MULTIVITAMIN WITH MINERALS) TABS tablet Take 1 tablet by mouth 3 (three) times a week.      ONETOUCH ULTRA test strip daily. for testing blood sugar     polyethylene glycol (MIRALAX / GLYCOLAX) 17 g packet Take 17 g by mouth daily.     potassium chloride SA (KLOR-CON M) 20 MEQ tablet Take 1 tablet (20 mEq total) by mouth daily. 90 tablet 3   Probiotic Product (ALIGN) 4 MG CAPS Take 4 mg by  mouth daily.     psyllium (HYDROCIL/METAMUCIL) 95 % PACK Take 1 packet by mouth daily.      rosuvastatin (CRESTOR) 20 MG tablet Take 1 tablet (20 mg total) by mouth daily. 30 tablet 5   Semaglutide, 1 MG/DOSE, (OZEMPIC, 1 MG/DOSE,) 4 MG/3ML SOPN Inject 1 mg into the skin once a week. 3 mL 2   Semaglutide, 2 MG/DOSE, (OZEMPIC, 2 MG/DOSE,) 8 MG/3ML SOPN Inject 2 mg into the skin once a week. 3 mL 2   TURMERIC PO Take 500 mg by mouth every other day.     vitamin E 400 UNIT capsule Take 400 Units by mouth every other day.     No current  facility-administered medications for this visit.    PHYSICAL EXAMINATION: ECOG PERFORMANCE STATUS: {CHL ONC ECOG PS:[phone]}  There were no vitals filed for this visit. There were no vitals filed for this visit.  BREAST:*** No palpable masses or nodules in either right or left breasts. No palpable axillary supraclavicular or infraclavicular adenopathy no breast tenderness or nipple discharge. (exam performed in the presence of a chaperone)  LABORATORY DATA:  I have reviewed the data as listed    Latest Ref Rng & Units 02/24/2021   12:04 PM 11/29/2020    5:38 AM 11/28/2020    4:56 AM  CMP  Glucose 65 - 99 mg/dL 108  108  137   BUN 8 - 27 mg/dL _0 Creatinine 0.57 - 1.00 mg/dL 0.60  0.55  0.68   Sodium 134 - 144 mmol/L 141  137  136   Potassium 3.5 - 5.2 mmol/L 4.3  4.0  4.2   Chloride 96 - 106 mmol/L 102  101  100   CO2 20 - 29 mmol/L _1 Calcium 8.7 - 10.3 mg/dL 10.7  8.4  8.6     Lab Results  Component Value Date   WBC 6.6 11/29/2020   HGB 8.9 (L) 11/29/2020   HCT 27.8 (L) 11/29/2020   MCV 95.2 11/29/2020   PLT 190 11/29/2020   NEUTROABS 8.0 (H) 11/25/2020    ASSESSMENT & PLAN:  No problem-specific Assessment & Plan notes found for this encounter.    No orders of the defined types were placed in this encounter.  The patient has a good understanding of the overall plan. she agrees with it. she will call with any problems that may develop before the next visit here. Total time spent: 30 mins including face to face time and time spent for planning, charting and co-ordination of care   Andrea Farmer, [place] 04/30/22    I Gardiner Coins am scribing for Dr. Lindi Adie  ***

## 2022-05-03 ENCOUNTER — Other Ambulatory Visit: Payer: Self-pay

## 2022-05-03 ENCOUNTER — Inpatient Hospital Stay: Payer: PPO | Attending: Hematology and Oncology | Admitting: Hematology and Oncology

## 2022-05-03 DIAGNOSIS — Z170 Estrogen receptor positive status [ER+]: Secondary | ICD-10-CM | POA: Insufficient documentation

## 2022-05-03 DIAGNOSIS — Z79811 Long term (current) use of aromatase inhibitors: Secondary | ICD-10-CM | POA: Diagnosis not present

## 2022-05-03 DIAGNOSIS — Z923 Personal history of irradiation: Secondary | ICD-10-CM | POA: Diagnosis not present

## 2022-05-03 DIAGNOSIS — C50511 Malignant neoplasm of lower-outer quadrant of right female breast: Secondary | ICD-10-CM | POA: Insufficient documentation

## 2022-05-03 NOTE — Assessment & Plan Note (Addendum)
02/15/2020:Screening mammogram detected right breast mass. 0.4cm mass at the 6:30 position and no axillary adenopathy. Biopsy: invasive ductal carcinoma, grade 3, HER-2 negative (1+), ER+ 95%, PR- 0%, KI67 40%. T1 a N0 stage Ia clinical stage  03/20/2020:Right lumpectomy [name]): invasive and in situ ductal carcinoma, 0.5cm, clear margins, 4 right axillary lymph nodes negativeT1 a N0 stage Ia pathologic stage 04/22/2020-05/12/2020: Adjuvant radiation With radiation she felt slightly depressed to the point that she was crying. She improved with the counseling and support of her husband.  Treatment plan: Adjuvant antiestrogen therapywith anastrozole 1 mg daily  06/02/2020- 10/22 (stopped because of muscle aches and pains) started 09/13/21 letrozole  Letrozoletoxicities:   Improved muscle aches and pains.  Fracture of the left hip February 2022: Status post surgery.     Breast cancer surveillance: 1.Breast exam 05/03/2022: Benign, right breast lymphedema 2.mammogram 02/08/2022: Benign breast density category B  Bone density 03/04/2022: T score -2.2:    Return to clinic in 1 year for follow-up

## 2022-05-04 DIAGNOSIS — M4316 Spondylolisthesis, lumbar region: Secondary | ICD-10-CM | POA: Diagnosis not present

## 2022-05-04 DIAGNOSIS — M9901 Segmental and somatic dysfunction of cervical region: Secondary | ICD-10-CM | POA: Diagnosis not present

## 2022-05-04 DIAGNOSIS — M9903 Segmental and somatic dysfunction of lumbar region: Secondary | ICD-10-CM | POA: Diagnosis not present

## 2022-05-04 DIAGNOSIS — M50322 Other cervical disc degeneration at C5-C6 level: Secondary | ICD-10-CM | POA: Diagnosis not present

## 2022-05-04 DIAGNOSIS — M4126 Other idiopathic scoliosis, lumbar region: Secondary | ICD-10-CM | POA: Diagnosis not present

## 2022-05-04 DIAGNOSIS — M545 Low back pain, unspecified: Secondary | ICD-10-CM | POA: Diagnosis not present

## 2022-05-04 DIAGNOSIS — M5136 Other intervertebral disc degeneration, lumbar region: Secondary | ICD-10-CM | POA: Diagnosis not present

## 2022-05-04 DIAGNOSIS — M50321 Other cervical disc degeneration at C4-C5 level: Secondary | ICD-10-CM | POA: Diagnosis not present

## 2022-05-05 DIAGNOSIS — C50511 Malignant neoplasm of lower-outer quadrant of right female breast: Secondary | ICD-10-CM | POA: Diagnosis not present

## 2022-05-05 DIAGNOSIS — E785 Hyperlipidemia, unspecified: Secondary | ICD-10-CM | POA: Diagnosis not present

## 2022-05-05 DIAGNOSIS — E876 Hypokalemia: Secondary | ICD-10-CM | POA: Diagnosis not present

## 2022-05-05 DIAGNOSIS — I2510 Atherosclerotic heart disease of native coronary artery without angina pectoris: Secondary | ICD-10-CM | POA: Diagnosis not present

## 2022-05-05 DIAGNOSIS — I700 Atherosclerosis of aorta: Secondary | ICD-10-CM | POA: Diagnosis not present

## 2022-05-05 DIAGNOSIS — E1169 Type 2 diabetes mellitus with other specified complication: Secondary | ICD-10-CM | POA: Diagnosis not present

## 2022-05-05 DIAGNOSIS — G8929 Other chronic pain: Secondary | ICD-10-CM | POA: Diagnosis not present

## 2022-05-05 DIAGNOSIS — E1139 Type 2 diabetes mellitus with other diabetic ophthalmic complication: Secondary | ICD-10-CM | POA: Diagnosis not present

## 2022-05-05 DIAGNOSIS — E1159 Type 2 diabetes mellitus with other circulatory complications: Secondary | ICD-10-CM | POA: Diagnosis not present

## 2022-05-05 DIAGNOSIS — E663 Overweight: Secondary | ICD-10-CM | POA: Diagnosis not present

## 2022-05-05 DIAGNOSIS — I10 Essential (primary) hypertension: Secondary | ICD-10-CM | POA: Diagnosis not present

## 2022-05-05 DIAGNOSIS — J309 Allergic rhinitis, unspecified: Secondary | ICD-10-CM | POA: Diagnosis not present

## 2022-05-07 ENCOUNTER — Telehealth: Payer: Self-pay | Admitting: Pharmacist

## 2022-05-07 DIAGNOSIS — E119 Type 2 diabetes mellitus without complications: Secondary | ICD-10-CM

## 2022-05-07 MED ORDER — OZEMPIC (0.25 OR 0.5 MG/DOSE) 2 MG/3ML ~~LOC~~ SOPN: 0.5000 mg | PEN_INJECTOR | SUBCUTANEOUS | 2 refills | Status: DC

## 2022-05-07 NOTE — Telephone Encounter (Signed)
Patient called. Having GI issues with Ozempic so she discontinued for about a month but would like to restart. Advised she should restart at 0.'5mg'$  before increasing to 1.0. Has lost ~12#.  Rx for mg sent to pharmacy

## 2022-05-11 DIAGNOSIS — M50322 Other cervical disc degeneration at C5-C6 level: Secondary | ICD-10-CM | POA: Diagnosis not present

## 2022-05-11 DIAGNOSIS — M50321 Other cervical disc degeneration at C4-C5 level: Secondary | ICD-10-CM | POA: Diagnosis not present

## 2022-05-11 DIAGNOSIS — M9901 Segmental and somatic dysfunction of cervical region: Secondary | ICD-10-CM | POA: Diagnosis not present

## 2022-05-11 DIAGNOSIS — M4316 Spondylolisthesis, lumbar region: Secondary | ICD-10-CM | POA: Diagnosis not present

## 2022-05-11 DIAGNOSIS — M9903 Segmental and somatic dysfunction of lumbar region: Secondary | ICD-10-CM | POA: Diagnosis not present

## 2022-05-11 DIAGNOSIS — M5136 Other intervertebral disc degeneration, lumbar region: Secondary | ICD-10-CM | POA: Diagnosis not present

## 2022-05-11 DIAGNOSIS — M4126 Other idiopathic scoliosis, lumbar region: Secondary | ICD-10-CM | POA: Diagnosis not present

## 2022-05-11 DIAGNOSIS — M545 Low back pain, unspecified: Secondary | ICD-10-CM | POA: Diagnosis not present

## 2022-05-17 DIAGNOSIS — E1169 Type 2 diabetes mellitus with other specified complication: Secondary | ICD-10-CM | POA: Diagnosis not present

## 2022-05-17 DIAGNOSIS — M179 Osteoarthritis of knee, unspecified: Secondary | ICD-10-CM | POA: Diagnosis not present

## 2022-05-17 DIAGNOSIS — K5900 Constipation, unspecified: Secondary | ICD-10-CM | POA: Diagnosis not present

## 2022-05-17 DIAGNOSIS — C50919 Malignant neoplasm of unspecified site of unspecified female breast: Secondary | ICD-10-CM | POA: Diagnosis not present

## 2022-05-18 DIAGNOSIS — C50911 Malignant neoplasm of unspecified site of right female breast: Secondary | ICD-10-CM | POA: Diagnosis not present

## 2022-05-18 DIAGNOSIS — Z170 Estrogen receptor positive status [ER+]: Secondary | ICD-10-CM | POA: Diagnosis not present

## 2022-05-27 DIAGNOSIS — M545 Low back pain, unspecified: Secondary | ICD-10-CM | POA: Diagnosis not present

## 2022-05-27 DIAGNOSIS — M50322 Other cervical disc degeneration at C5-C6 level: Secondary | ICD-10-CM | POA: Diagnosis not present

## 2022-05-27 DIAGNOSIS — M5136 Other intervertebral disc degeneration, lumbar region: Secondary | ICD-10-CM | POA: Diagnosis not present

## 2022-05-27 DIAGNOSIS — M9903 Segmental and somatic dysfunction of lumbar region: Secondary | ICD-10-CM | POA: Diagnosis not present

## 2022-05-27 DIAGNOSIS — M4316 Spondylolisthesis, lumbar region: Secondary | ICD-10-CM | POA: Diagnosis not present

## 2022-05-27 DIAGNOSIS — M4126 Other idiopathic scoliosis, lumbar region: Secondary | ICD-10-CM | POA: Diagnosis not present

## 2022-05-27 DIAGNOSIS — M9901 Segmental and somatic dysfunction of cervical region: Secondary | ICD-10-CM | POA: Diagnosis not present

## 2022-05-27 DIAGNOSIS — M50321 Other cervical disc degeneration at C4-C5 level: Secondary | ICD-10-CM | POA: Diagnosis not present

## 2022-06-10 DIAGNOSIS — M4316 Spondylolisthesis, lumbar region: Secondary | ICD-10-CM | POA: Diagnosis not present

## 2022-06-10 DIAGNOSIS — M9901 Segmental and somatic dysfunction of cervical region: Secondary | ICD-10-CM | POA: Diagnosis not present

## 2022-06-10 DIAGNOSIS — M545 Low back pain, unspecified: Secondary | ICD-10-CM | POA: Diagnosis not present

## 2022-06-10 DIAGNOSIS — M5136 Other intervertebral disc degeneration, lumbar region: Secondary | ICD-10-CM | POA: Diagnosis not present

## 2022-06-10 DIAGNOSIS — M50322 Other cervical disc degeneration at C5-C6 level: Secondary | ICD-10-CM | POA: Diagnosis not present

## 2022-06-10 DIAGNOSIS — M4126 Other idiopathic scoliosis, lumbar region: Secondary | ICD-10-CM | POA: Diagnosis not present

## 2022-06-10 DIAGNOSIS — M9903 Segmental and somatic dysfunction of lumbar region: Secondary | ICD-10-CM | POA: Diagnosis not present

## 2022-06-10 DIAGNOSIS — M50321 Other cervical disc degeneration at C4-C5 level: Secondary | ICD-10-CM | POA: Diagnosis not present

## 2022-06-11 ENCOUNTER — Other Ambulatory Visit: Payer: Self-pay | Admitting: Family Medicine

## 2022-06-11 DIAGNOSIS — Z1231 Encounter for screening mammogram for malignant neoplasm of breast: Secondary | ICD-10-CM

## 2022-06-15 DIAGNOSIS — D2272 Melanocytic nevi of left lower limb, including hip: Secondary | ICD-10-CM | POA: Diagnosis not present

## 2022-06-15 DIAGNOSIS — L817 Pigmented purpuric dermatosis: Secondary | ICD-10-CM | POA: Diagnosis not present

## 2022-06-15 DIAGNOSIS — L821 Other seborrheic keratosis: Secondary | ICD-10-CM | POA: Diagnosis not present

## 2022-06-15 DIAGNOSIS — D692 Other nonthrombocytopenic purpura: Secondary | ICD-10-CM | POA: Diagnosis not present

## 2022-06-15 DIAGNOSIS — D225 Melanocytic nevi of trunk: Secondary | ICD-10-CM | POA: Diagnosis not present

## 2022-06-15 DIAGNOSIS — Z419 Encounter for procedure for purposes other than remedying health state, unspecified: Secondary | ICD-10-CM | POA: Diagnosis not present

## 2022-06-15 DIAGNOSIS — L820 Inflamed seborrheic keratosis: Secondary | ICD-10-CM | POA: Diagnosis not present

## 2022-06-15 DIAGNOSIS — D1801 Hemangioma of skin and subcutaneous tissue: Secondary | ICD-10-CM | POA: Diagnosis not present

## 2022-06-24 DIAGNOSIS — M50321 Other cervical disc degeneration at C4-C5 level: Secondary | ICD-10-CM | POA: Diagnosis not present

## 2022-06-24 DIAGNOSIS — M545 Low back pain, unspecified: Secondary | ICD-10-CM | POA: Diagnosis not present

## 2022-06-24 DIAGNOSIS — M9901 Segmental and somatic dysfunction of cervical region: Secondary | ICD-10-CM | POA: Diagnosis not present

## 2022-06-24 DIAGNOSIS — M4126 Other idiopathic scoliosis, lumbar region: Secondary | ICD-10-CM | POA: Diagnosis not present

## 2022-06-24 DIAGNOSIS — M5136 Other intervertebral disc degeneration, lumbar region: Secondary | ICD-10-CM | POA: Diagnosis not present

## 2022-06-24 DIAGNOSIS — M50322 Other cervical disc degeneration at C5-C6 level: Secondary | ICD-10-CM | POA: Diagnosis not present

## 2022-06-24 DIAGNOSIS — M4316 Spondylolisthesis, lumbar region: Secondary | ICD-10-CM | POA: Diagnosis not present

## 2022-06-24 DIAGNOSIS — M9903 Segmental and somatic dysfunction of lumbar region: Secondary | ICD-10-CM | POA: Diagnosis not present

## 2022-07-03 IMAGING — MG DIGITAL DIAGNOSTIC BILAT W/ TOMO W/ CAD
6 of 11 series · 6 of 31 positions shown · non-contrast
Comparison: Previous exam(s).

CLINICAL DATA: Two right lumpectomies.  Annual mammography.

EXAM:
DIGITAL DIAGNOSTIC BILATERAL MAMMOGRAM WITH TOMOSYNTHESIS AND CAD
TECHNIQUE: Bilateral digital diagnostic mammography and breast tomosynthesis
was performed. The images were evaluated with computer-aided
detection.

[R MLO]
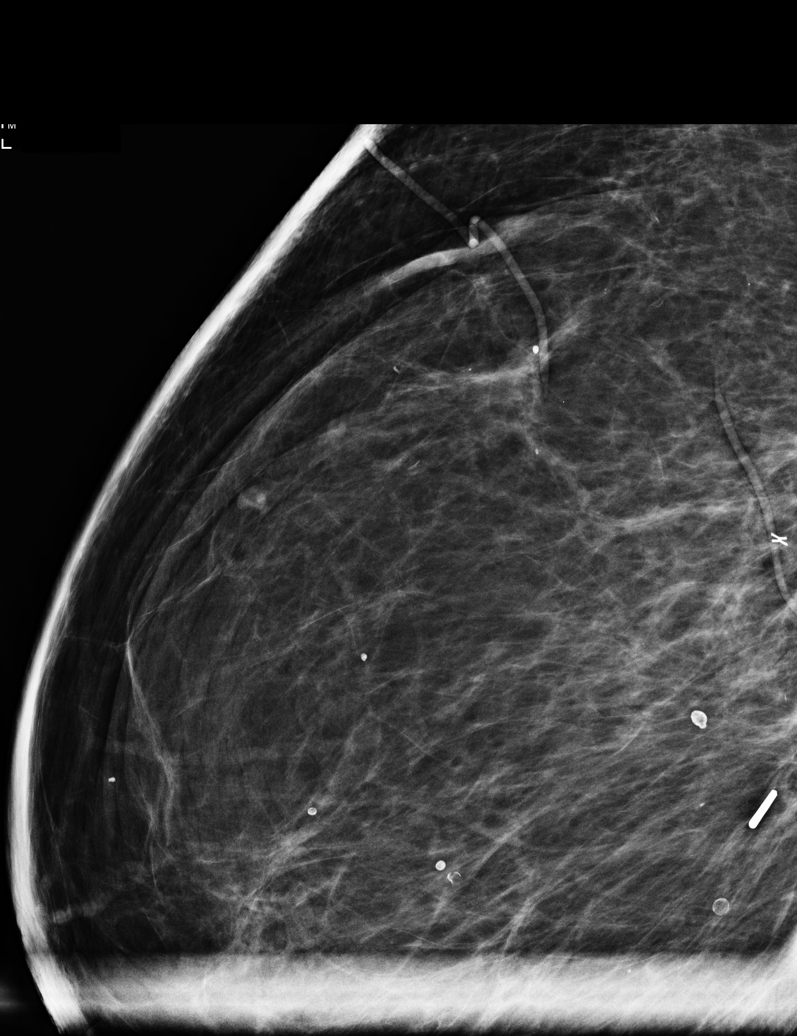

[R CC synth-2D]
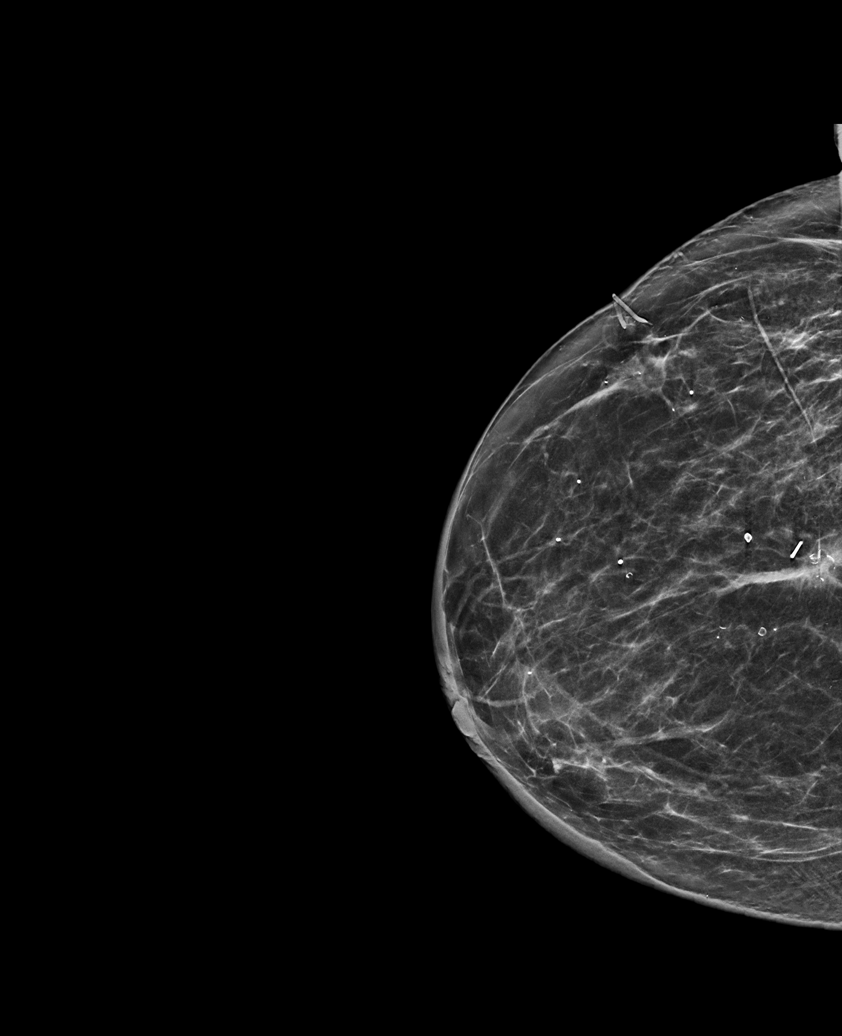

[L CC synth-2D]
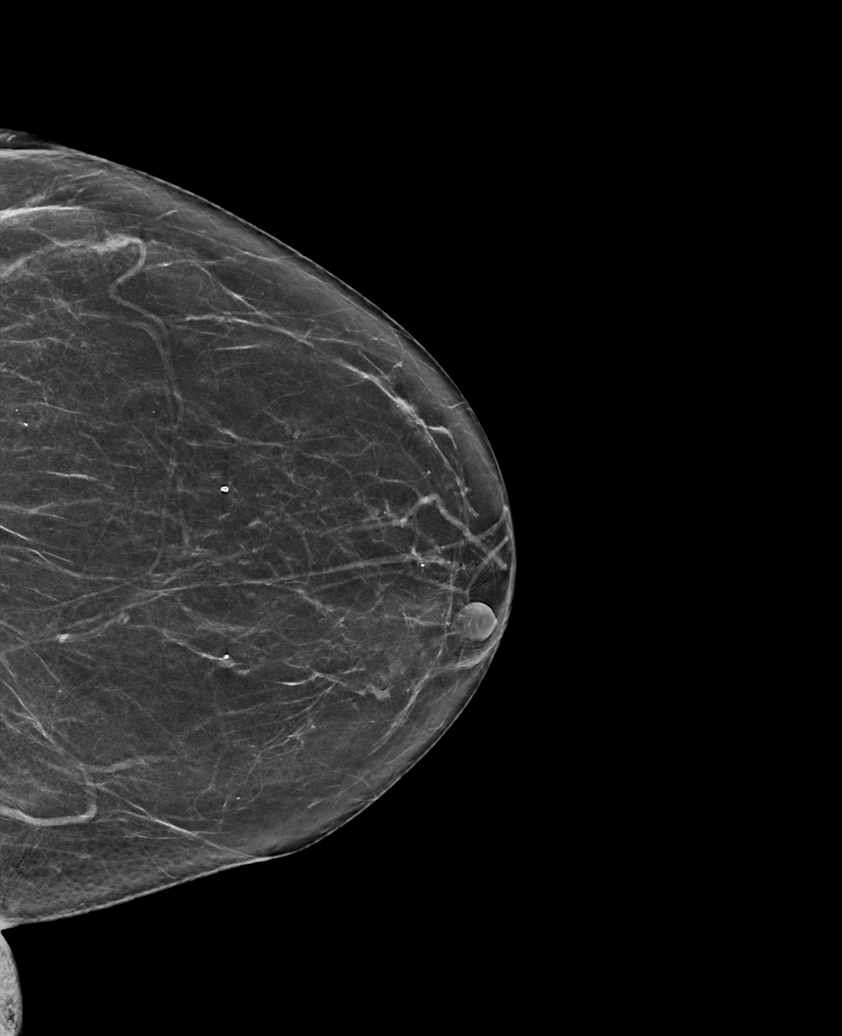

[L MLO synth-2D (1 of 2)]
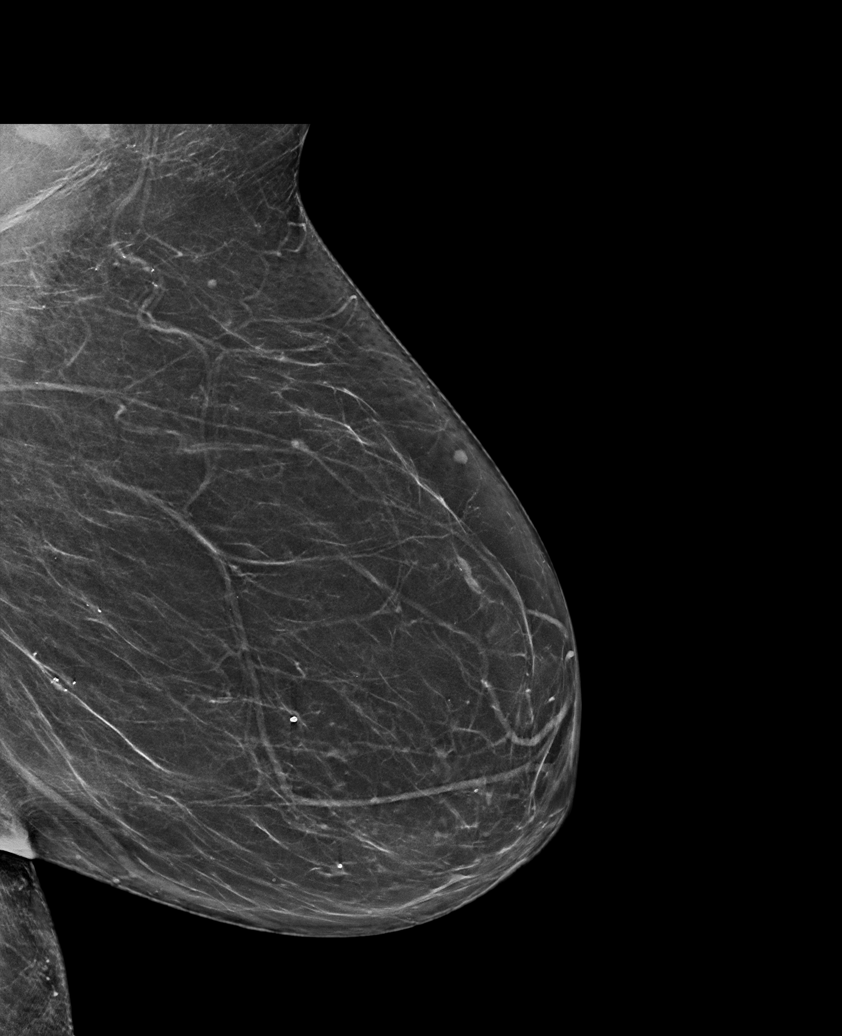

[L MLO synth-2D (2 of 2)]
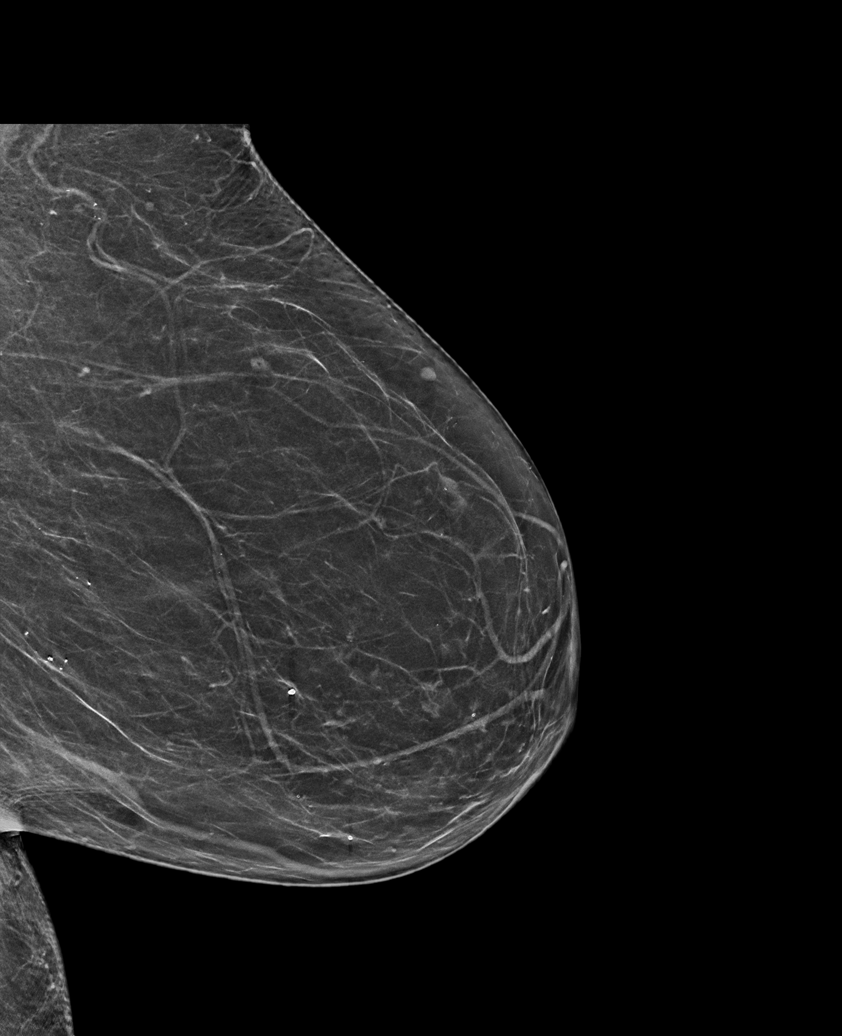

[R MLO synth-2D]
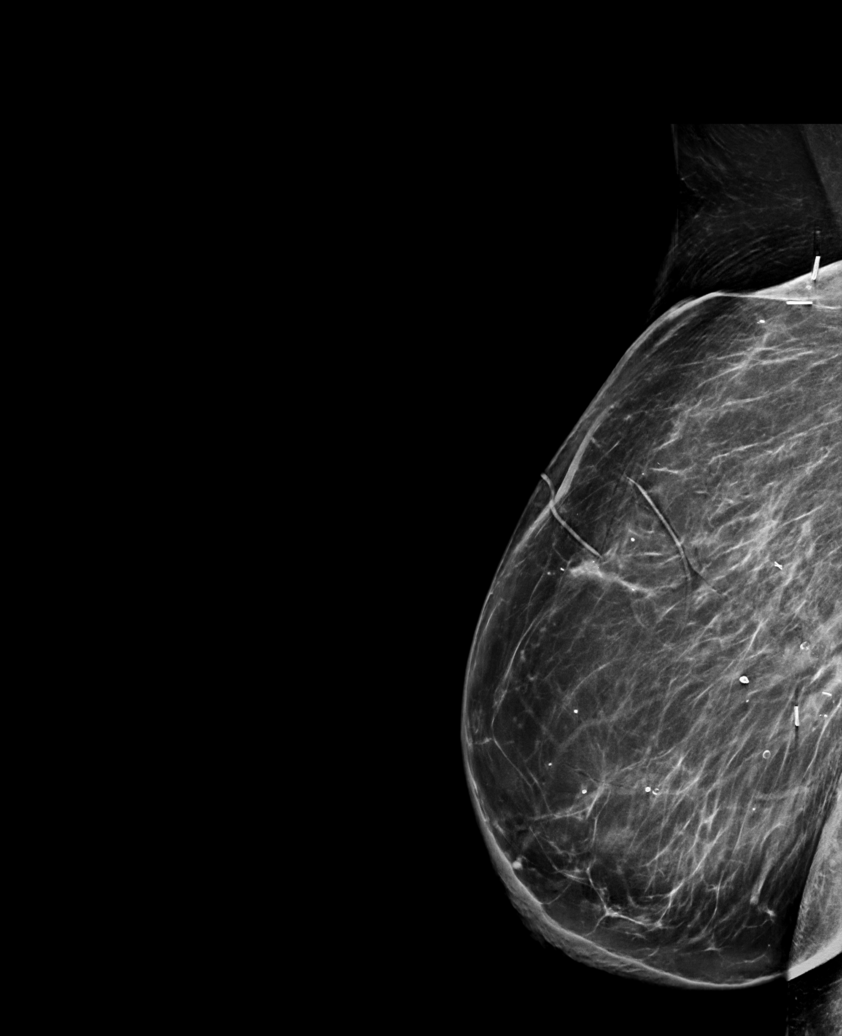

[6 of 31 positions shown; findings below may reference images not displayed]

ACR Breast Density Category b: There are scattered areas of
fibroglandular density.
FINDINGS: The right lumpectomy sites of not changed in any suspicious manner.
No new or suspicious findings in either breast.
IMPRESSION: No mammographic evidence of malignancy.

RECOMMENDATION:
Per protocol, as the patient is now 2 or more years status post
lumpectomy, she may return to annual screening mammography in 1
year. However, given the history of breast cancer, the patient
remains eligible for annual diagnostic mammography if preferred.

I have discussed the findings and recommendations with the patient.
If applicable, a reminder letter will be sent to the patient
regarding the next appointment.

BI-RADS CATEGORY  2: Benign.

## 2022-07-08 DIAGNOSIS — M50321 Other cervical disc degeneration at C4-C5 level: Secondary | ICD-10-CM | POA: Diagnosis not present

## 2022-07-08 DIAGNOSIS — M5136 Other intervertebral disc degeneration, lumbar region: Secondary | ICD-10-CM | POA: Diagnosis not present

## 2022-07-08 DIAGNOSIS — M4126 Other idiopathic scoliosis, lumbar region: Secondary | ICD-10-CM | POA: Diagnosis not present

## 2022-07-08 DIAGNOSIS — M9901 Segmental and somatic dysfunction of cervical region: Secondary | ICD-10-CM | POA: Diagnosis not present

## 2022-07-08 DIAGNOSIS — M9903 Segmental and somatic dysfunction of lumbar region: Secondary | ICD-10-CM | POA: Diagnosis not present

## 2022-07-08 DIAGNOSIS — M545 Low back pain, unspecified: Secondary | ICD-10-CM | POA: Diagnosis not present

## 2022-07-08 DIAGNOSIS — M50322 Other cervical disc degeneration at C5-C6 level: Secondary | ICD-10-CM | POA: Diagnosis not present

## 2022-07-08 DIAGNOSIS — M4316 Spondylolisthesis, lumbar region: Secondary | ICD-10-CM | POA: Diagnosis not present

## 2022-07-14 NOTE — Progress Notes (Unsigned)
Cardiology Office Note:    Date:  07/14/2022   ID:  Andrea Farmer, DOB [birth year], MRN [id number]  PCP:  Andrea Cruel, MD   Andrea Farmer Providers Cardiologist:  Andrea Bergeron, MD {   Referring MD: Andrea Cruel, MD    History of Present Illness:    Andrea Farmer is a 81 y.o. 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 Meda Farmer who now returns to clinic for follow-up.  Per review of the chart, in spring of 2021, she noted increased palpitations. Monitor in 01/2020 showed 11% PVC burden, multiple runs of atrial tachycardia, and short runs of non-sustained VT. Echo showed LVEF of 60-65% with normal wall motion, grade 1 diastolic dysfunction, and mild MR. She was last seen by Dr. Meda Farmer in 02/2020 at which time she reported fatigue but was otherwise. Given worsening number of PVCs, repeat coronary CTA showed moderate disease that was negative by CT FFR.  Was last seen in clinic in 10/2021 where she was doing well from a CV standpoint. Had recovered from hip and breast surgery.  Today, ***  The patient denies chest pain, chest pressure, dyspnea at rest or with exertion, palpitations, PND, orthopnea, or leg swelling. Denies cough, fever, chills. Denies nausea, vomiting. Denies syncope or presyncope. Denies dizziness or lightheadedness. Denies snoring.  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   Pneumonia     30 plus yrs ago    Past Surgical History:  Procedure Laterality Date   ABDOMINAL HYSTERECTOMY     BREAST BIOPSY Right 05/14/2015   BREAST EXCISIONAL BIOPSY Left    BREAST EXCISIONAL BIOPSY Right    BREAST LUMPECTOMY WITH RADIOACTIVE SEED AND SENTINEL LYMPH NODE BIOPSY Right 03/20/2020   Procedure: RIGHT BREAST LUMPECTOMY WITH RADIOACTIVE SEED AND SENTINEL LYMPH NODE BIOPSY;  Surgeon: Andrea Kussmaul, MD;  Location: [facility];  Service: General;  Laterality: Right;   BREAST LUMPECTOMY WITH RADIOACTIVE SEED LOCALIZATION Right 04/27/2019   Procedure: RIGHT BREAST LUMPECTOMY WITH RADIOACTIVE SEED LOCALIZATION;  Surgeon: Andrea Kussmaul, MD;  Location: [facility];  Service: General;  Laterality: Right;   INTRAMEDULLARY (IM) NAIL INTERTROCHANTERIC Left 11/26/2020   Procedure: INTRAMEDULLARY (IM) NAIL INTERTROCHANTRIC;  Surgeon: Andrea Cancel, MD;  Location: [facility];  Service: Orthopedics;  Laterality: Left;   RECTAL SURGERY     x 3   REPLACEMENT TOTAL KNEE BILATERAL     SYNOVECTOMY Left 07/21/2015   Procedure: LEFT KNEE SYNOVECTOMY with POLY EXCHANGE;  Surgeon: Andrea Huger, MD;  Location: [facility];  Service: Orthopedics;  Laterality: Left;   TOTAL KNEE REVISION Right 07/05/2016   Procedure: TOTAL KNEE REVISION WITH SCAR DEBRIDEMENT/PATELLA REVISION WITH POLY EXCHANGE;  Surgeon: Andrea Huger, MD;  Location: [facility];  Service: Orthopedics;  Laterality: Right;  Current Medications: No outpatient medications have been marked as taking for the 07/15/22 encounter (Appointment) with Andrea Bergeron, MD.     Allergies:   Tramadol   Social History   Socioeconomic History   Marital status: Soil scientist    Spouse name: Not on file   Number of children: Not on file   Years of education: Not on file   Highest education level: Not on file  Occupational History   Not on file  Tobacco Use   Smoking status: Never   Smokeless tobacco: Never  Vaping Use   Vaping  Use: Never used  Substance and Sexual Activity   Alcohol use: Yes    Comment: WINE OCC   Drug use: No   Sexual activity: Not on file  Other Topics Concern   Not on file  Social History Narrative   Not on file   Social Determinants of Health   Financial Resource Strain: Not on file  Food Insecurity: Not on file  Transportation Needs: Not on file  Physical Activity: Not on file  Stress: Not on file  Social Connections: Not on file     Family History: The patient's family history includes Aneurysm in her father; Cancer in her mother.  ROS:   Please see the history of present illness.    Review of Systems  Constitutional:  Negative for chills and fever.  HENT:  Negative for hearing loss.   Eyes:  Negative for blurred vision and redness.  Respiratory:  Negative for shortness of breath.   Cardiovascular:  Negative for chest pain, palpitations, orthopnea, claudication, leg swelling and PND.  Gastrointestinal:  Negative for melena, nausea and vomiting.  Genitourinary:  Negative for flank pain.  Musculoskeletal:  Positive for back pain. Negative for falls, joint pain and myalgias.  Skin:  Negative for itching and rash.  Neurological:  Negative for dizziness and loss of consciousness.  Endo/Heme/Allergies:  Negative for polydipsia. Does not bruise/bleed easily.  Psychiatric/Behavioral:  Negative for substance abuse. The patient is nervous/anxious.     EKGs/Labs/Other Studies Reviewed:    The following studies were reviewed today: TTE 13-Jul-2021: IMPRESSIONS   1. Left ventricular ejection fraction, by estimation, is 60 to 65%. The  left ventricle has normal function. The left ventricle has no regional  wall motion abnormalities. Left ventricular diastolic parameters were  normal.   2. Right ventricular systolic function is normal. The right ventricular  size is normal. There is normal pulmonary artery systolic pressure.   3. The mitral valve is abnormal. Mild mitral valve  regurgitation. No  evidence of mitral stenosis. Severe mitral annular calcification.   4. The aortic valve is tricuspid. There is mild calcification of the  aortic valve. Aortic valve regurgitation is not visualized. Mild aortic  valve sclerosis is present, with no evidence of aortic valve stenosis.   5. Ascending aorta measurement unchanged from 02/20/20. Aortic dilatation  noted. There is borderline dilatation of the ascending aorta, measuring 39  mm.   6. The inferior vena cava is normal in size with greater than 50%  respiratory variability, suggesting right atrial pressure of 3 mmHg.   Comparison(s): No significant change from prior study.   TTE 02/20/2020  1. Left ventricular ejection fraction, by estimation, is 60 to 65%. The  left ventricle has normal function. The left ventricle has no regional  wall motion abnormalities. There is mild left ventricular hypertrophy.  Left ventricular diastolic parameters  are consistent with Grade I diastolic dysfunction (impaired relaxation).  2. Right ventricular systolic function is normal. The right ventricular  size is normal. There is normal pulmonary artery systolic pressure. The  estimated right ventricular systolic pressure is 32.4 mmHg.   3. The mitral valve is abnormal. Mild mitral valve regurgitation.   4. The aortic valve is tricuspid. Aortic valve regurgitation is not  visualized.   5. Aortic dilatation noted. 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   6. The inferior vena cava is normal in size with greater than 50%  respiratory variability, suggesting right atrial pressure of 3 mmHg.    Coronary CTA 2020-03-05 25-49% RCA 25-49% left main Proximal LAD 50-69% Mid LAD 50-69% Minimal less than 25% left circumflex CT FFR negative  EKG:  EKG was not ordered today 02/17/21: NSR with LAD, HR 70  Recent Labs: No results found for requested labs within last 365 days.  Recent Lipid Panel    Component  Value Date/Time   CHOL 134 02/16/2022 0943   CHOL 164 05/20/2016 1127   TRIG 101 02/16/2022 0943   TRIG 88 05/20/2016 1127   HDL 51 02/16/2022 0943   HDL 55 05/20/2016 1127   CHOLHDL 2.6 02/16/2022 0943   CHOLHDL 3.0 05/20/2016 1127   LDLCALC 64 02/16/2022 0943   LDLCALC 91 05/20/2016 1127      Physical Exam:    VS:  There were no vitals taken for this visit.    Wt Readings from Last 3 Encounters:  05/03/22 169 lb 9 oz (76.9 kg)  01/13/22 180 lb 3.2 oz (81.7 kg)  11/10/21 174 lb 12.8 oz (79.3 kg)     GEN:  Well nourished, well developed in no acute distress HEENT: Normal NECK: No JVD; No carotid bruits CARDIAC: RRR, 1/6 systolic murmur at the RUSB, no rubs or gallops RESPIRATORY:  Clear to auscultation without rales, wheezing or rhonchi  ABDOMEN: Soft, non-tender, non-distended MUSCULOSKELETAL:  no edema SKIN: Warm and dry NEUROLOGIC:  Alert and oriented x 3 PSYCHIATRIC:  Normal affect   ASSESSMENT:    No diagnosis found.   PLAN:    In order of problems listed above:  #PVCs/brief SVT Recent monitor with 11.3% PVC burden. TTE with normal LVEF, no significant valve disease, normal RV. Occasional palpitations but overall not very symptomatic. Doing well on metop. -Continue metop '25mg'$  XL daily   #Nonobstructive CAD Coronary CTA 02/2020 with 25-50% RCA, 25-50% LM, 50-70% pLAD, 50-70%mLAD, <25% LCx. FFR negative. No anginal symptoms.  -Continue ASA '81mg'$  daily -Continue home Crestor '40mg'$  daily and Zetia '10mg'$  daily  #HLD: -Continue crestor '40mg'$  daily and zetia '10mg'$  daily -Lipids at next visit  #LE Edema: Improved.  -Continue HCTZ '25mg'$  daily  #Mechanical fall with left hip fracture: S/p ORIF. Doing well and working with PT. -Follow-up with ortho as scheduled  #HTN: Well controlled. 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 -Serial monitoring with repeat  TTE yearly  Medication Adjustments/Labs and Tests Ordered: Current medicines are reviewed at length with the patient today.  Concerns regarding medicines are outlined above.  No orders of the defined types were placed in this encounter.  No orders of the defined types were placed in this encounter.   There are no Patient Instructions on file for this visit.   I,Mykaella Javier,acting as a scribe for HFreada Bergeron MD.,have documented all relevant documentation on the behalf of HFreada Bergeron MD,as directed by  HFreada Bergeron  MD while in the presence of Andrea Bergeron, MD.  I, Andrea Bergeron, MD, have reviewed all documentation for this visit. The documentation on 07/14/22 for the exam, diagnosis, procedures, and orders are all accurate and complete.   Signed, Andrea Bergeron, MD  07/14/2022 1:35 PM    Andrea Farmer

## 2022-07-15 ENCOUNTER — Encounter: Payer: Self-pay | Admitting: Cardiology

## 2022-07-15 ENCOUNTER — Ambulatory Visit: Payer: PPO | Attending: Cardiology | Admitting: Cardiology

## 2022-07-15 VITALS — BP 116/74 | HR 70 | Ht 64.0 in | Wt 169.4 lb

## 2022-07-15 DIAGNOSIS — I77819 Aortic ectasia, unspecified site: Secondary | ICD-10-CM

## 2022-07-15 DIAGNOSIS — I77810 Thoracic aortic ectasia: Secondary | ICD-10-CM

## 2022-07-15 DIAGNOSIS — E782 Mixed hyperlipidemia: Secondary | ICD-10-CM | POA: Diagnosis not present

## 2022-07-15 DIAGNOSIS — I471 Supraventricular tachycardia, unspecified: Secondary | ICD-10-CM

## 2022-07-15 DIAGNOSIS — E7800 Pure hypercholesterolemia, unspecified: Secondary | ICD-10-CM

## 2022-07-15 DIAGNOSIS — I493 Ventricular premature depolarization: Secondary | ICD-10-CM

## 2022-07-15 DIAGNOSIS — E785 Hyperlipidemia, unspecified: Secondary | ICD-10-CM

## 2022-07-15 DIAGNOSIS — I10 Essential (primary) hypertension: Secondary | ICD-10-CM

## 2022-07-15 DIAGNOSIS — Z79899 Other long term (current) drug therapy: Secondary | ICD-10-CM

## 2022-07-15 DIAGNOSIS — I2510 Atherosclerotic heart disease of native coronary artery without angina pectoris: Secondary | ICD-10-CM

## 2022-07-15 DIAGNOSIS — E119 Type 2 diabetes mellitus without complications: Secondary | ICD-10-CM

## 2022-07-15 DIAGNOSIS — Z789 Other specified health status: Secondary | ICD-10-CM

## 2022-07-15 MED ORDER — HYDROCHLOROTHIAZIDE 25 MG PO TABS: 25.0000 mg | ORAL_TABLET | Freq: Every day | ORAL | 2 refills | Status: DC

## 2022-07-15 MED ORDER — AMLODIPINE BESY-BENAZEPRIL HCL 5-40 MG PO CAPS: 1.0000 | ORAL_CAPSULE | Freq: Every day | ORAL | 2 refills | Status: AC

## 2022-07-15 MED ORDER — POTASSIUM CHLORIDE CRYS ER 20 MEQ PO TBCR: 20.0000 meq | EXTENDED_RELEASE_TABLET | Freq: Every day | ORAL | 3 refills | Status: DC

## 2022-07-15 MED ORDER — EZETIMIBE 10 MG PO TABS: 10.0000 mg | ORAL_TABLET | Freq: Every day | ORAL | 3 refills | Status: DC

## 2022-07-15 MED ORDER — ROSUVASTATIN CALCIUM 20 MG PO TABS: 20.0000 mg | ORAL_TABLET | Freq: Every day | ORAL | 3 refills | Status: AC

## 2022-07-15 NOTE — Progress Notes (Signed)
Cardiology Office Note:    Date:  07/15/2022   ID:  Andrea Farmer, DOB August 23, 1941, MRN [id number]  PCP:  Lawerance Cruel, MD   [facility] Providers Cardiologist:  Freada Bergeron, MD {   Referring MD: Lawerance Cruel, MD    History of Present Illness:    Andrea Farmer is a 81 y.o. 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 Meda Coffee who now returns to clinic for follow-up.  Per review of the chart, in spring of 2021, she noted increased palpitations. Monitor in 01/2020 showed 11% PVC burden, multiple runs of atrial tachycardia, and short runs of non-sustained VT. Echo showed LVEF of 60-65% with normal wall motion, grade 1 diastolic dysfunction, and mild MR. She was last seen by Dr. Meda Coffee in 02/2020 at which time she reported fatigue but was otherwise. Given worsening number of PVCs, repeat coronary CTA showed moderate disease that was negative by CT FFR.  Was last seen in clinic in 10/2021 where she was doing well from a CV standpoint. Had recovered from hip and breast surgery.  Today, the patient states that she is not experiencing any palpitations or chest pain lately. She does complain of fatigue and myalgias that she attributes to her age. Aside from taking tylenol for pain management she will usually push through her symptoms.  Overall she is doing well on Ozempic. However, if she eats certain foods while on Ozempic she suffers from severe GI upset. Also she is concerned that she is not losing much weight. She states that she is taking the 4 mg dose. Additionally she notes that Ozempic has been very expensive when entering the donut hole.  For exercise she uses a stair-stepper without anginal symptoms.  She remains compliant with her potassium supplement. Also she takes 81 mg aspirin every other  day.  The patient denies chest pain, chest pressure, dyspnea at rest or with exertion, palpitations, PND, orthopnea, or leg swelling. Denies cough, fever, chills. Denies nausea, vomiting. Denies syncope or presyncope. Denies dizziness or lightheadedness. Denies snoring.  Past Medical History:  Diagnosis Date   Acid reflux    Back pain    arthritis   Cataract    immature but doesn't know which eye   Diabetes mellitus without complication (HCC)    High cholesterol    takes Crestor Andrea   History of colon polyps    benign   History of migraine    Hypertension    takes Lotrel and HCTZ dailydaily   Irritable bowel syndrome 03/11/2016   Joint pain    Joint swelling    Osteoarthritis of knee 03/11/2016   Pneumonia    30 plus yrs ago    Past Surgical History:  Procedure Laterality Date   ABDOMINAL HYSTERECTOMY     BREAST BIOPSY Right 05/14/2015   BREAST EXCISIONAL BIOPSY Left    BREAST EXCISIONAL BIOPSY Right    BREAST LUMPECTOMY WITH RADIOACTIVE SEED AND SENTINEL LYMPH NODE BIOPSY Right 03/20/2020   Procedure: RIGHT BREAST LUMPECTOMY WITH RADIOACTIVE SEED AND SENTINEL LYMPH NODE BIOPSY;  Surgeon: Jovita Kussmaul, MD;  Location: [facility];  Service: General;  Laterality: Right;   BREAST LUMPECTOMY WITH RADIOACTIVE SEED LOCALIZATION Right 04/27/2019   Procedure: RIGHT BREAST LUMPECTOMY WITH RADIOACTIVE SEED LOCALIZATION;  Surgeon: Jovita Kussmaul, MD;  Location:  [facility];  Service: General;  Laterality: Right;   INTRAMEDULLARY (IM) NAIL INTERTROCHANTERIC Left 11/26/2020   Procedure: INTRAMEDULLARY (IM) NAIL INTERTROCHANTRIC;  Surgeon: Paralee Cancel, MD;  Location: [facility];  Service: Orthopedics;  Laterality: Left;   RECTAL SURGERY     x 3   REPLACEMENT TOTAL KNEE BILATERAL     SYNOVECTOMY Left 07/21/2015   Procedure: LEFT KNEE SYNOVECTOMY with POLY EXCHANGE;  Surgeon: Vickey Huger, MD;  Location: [facility];  Service: Orthopedics;  Laterality: Left;   TOTAL KNEE  REVISION Right 07/05/2016   Procedure: TOTAL KNEE REVISION WITH SCAR DEBRIDEMENT/PATELLA REVISION WITH POLY EXCHANGE;  Surgeon: Vickey Huger, MD;  Location: [facility];  Service: Orthopedics;  Laterality: Right;    Current Medications: Current Meds  Medication Sig   acetaminophen (TYLENOL) 500 MG tablet Take 1,000 mg by mouth every 6 (six) hours as needed for moderate pain.   aspirin EC 81 MG tablet Take 1 tablet (81 mg total) by mouth every other day. Swallow whole.   Calcium Carbonate-Vitamin D 600-400 MG-UNIT tablet Take 1 tablet by mouth 3 (three) times a week.    Coenzyme Q10 (CO Q 10 PO) Take 200 mg by mouth every other day.    Hyprom-Naphaz-Polysorb-Zn Sulf (CLEAR EYES COMPLETE OP) Place 1 drop into both eyes Andrea as needed (dry eyes).   letrozole (FEMARA) 2.5 MG tablet Take 1 tablet (2.5 mg total) by mouth Andrea.   metFORMIN (GLUCOPHAGE-XR) 500 MG 24 hr tablet metformin ER 500 mg tablet,extended release 24 hr   mometasone (NASONEX) 50 MCG/ACT nasal spray Place 2 sprays into the nose 3 (three) times a week.   Multiple Vitamin (MULTIVITAMIN WITH MINERALS) TABS tablet Take 1 tablet by mouth 3 (three) times a week.    ONETOUCH ULTRA test strip Andrea. for testing blood sugar   polyethylene glycol (MIRALAX / GLYCOLAX) 17 g packet Take 17 g by mouth Andrea.   Probiotic Product (ALIGN) 4 MG CAPS Take 4 mg by mouth Andrea.   psyllium (HYDROCIL/METAMUCIL) 95 % PACK Take 1 packet by mouth Andrea.    Semaglutide, 1 MG/DOSE, (OZEMPIC, 1 MG/DOSE,) 4 MG/3ML SOPN Inject 1 mg into the skin once a week.   Semaglutide, 2 MG/DOSE, (OZEMPIC, 2 MG/DOSE,) 8 MG/3ML SOPN Inject 2 mg into the skin once a week.   Semaglutide,0.25 or 0.'5MG'$ /DOS, (OZEMPIC, 0.25 OR 0.5 MG/DOSE,) 2 MG/3ML SOPN Inject 0.5 mg into the skin once a week.   TURMERIC PO Take 500 mg by mouth every other day.   vitamin E 400 UNIT capsule Take 400 Units by mouth every other day.   [DISCONTINUED] amLODipine-benazepril (LOTREL) 5-40 MG capsule Take 1  capsule by mouth Andrea.   [DISCONTINUED] ezetimibe (ZETIA) 10 MG tablet Take 1 tablet (10 mg total) by mouth Andrea.   [DISCONTINUED] hydrochlorothiazide (HYDRODIURIL) 25 MG tablet Take 25 mg by mouth Andrea.   [DISCONTINUED] potassium chloride SA (KLOR-CON M) 20 MEQ tablet Take 1 tablet (20 mEq total) by mouth Andrea.   [DISCONTINUED] rosuvastatin (CRESTOR) 20 MG tablet Take 1 tablet (20 mg total) by mouth Andrea.     Allergies:   Tramadol   Social History   Socioeconomic History   Marital status: Soil scientist    Spouse name: Not on file   Number of children: Not on file   Years of education: Not on file   Highest education level: Not on file  Occupational History   Not on file  Tobacco Use   Smoking status: Never  Smokeless tobacco: Never  Vaping Use   Vaping Use: Never used  Substance and Sexual Activity   Alcohol use: Yes    Comment: WINE OCC   Drug use: No   Sexual activity: Not on file  Other Topics Concern   Not on file  Social History Narrative   Not on file   Social Determinants of Health   Financial Resource Strain: Not on file  Food Insecurity: Not on file  Transportation Needs: Not on file  Physical Activity: Not on file  Stress: Not on file  Social Connections: Not on file     Family History: The patient's family history includes Aneurysm in her father; Cancer in her mother.  ROS:   Please see the history of present illness.    Review of Systems  Constitutional:  Positive for malaise/fatigue. Negative for chills and fever.  HENT:  Negative for hearing loss.   Eyes:  Negative for blurred vision and redness.  Respiratory:  Negative for shortness of breath.   Cardiovascular:  Negative for chest pain, palpitations, orthopnea, claudication, leg swelling and PND.  Gastrointestinal:  Negative for melena, nausea and vomiting.  Genitourinary:  Negative for flank pain.  Musculoskeletal:  Positive for back pain and myalgias. Negative for falls and joint  pain.  Skin:  Negative for itching and rash.  Neurological:  Negative for dizziness and loss of consciousness.  Endo/Heme/Allergies:  Negative for polydipsia. Does not bruise/bleed easily.  Psychiatric/Behavioral:  Negative for depression and substance abuse.     EKGs/Labs/Other Studies Reviewed:    The following studies were reviewed today:  TTE 06/2021: IMPRESSIONS   1. Left ventricular ejection fraction, by estimation, is 60 to 65%. The  left ventricle has normal function. The left ventricle has no regional  wall motion abnormalities. Left ventricular diastolic parameters were  normal.   2. Right ventricular systolic function is normal. The right ventricular  size is normal. There is normal pulmonary artery systolic pressure.   3. The mitral valve is abnormal. Mild mitral valve regurgitation. No  evidence of mitral stenosis. Severe mitral annular calcification.   4. The aortic valve is tricuspid. There is mild calcification of the  aortic valve. Aortic valve regurgitation is not visualized. Mild aortic  valve sclerosis is present, with no evidence of aortic valve stenosis.   5. Ascending aorta measurement unchanged from 02/20/20. Aortic dilatation  noted. There is borderline dilatation of the ascending aorta, measuring 39  mm.   6. The inferior vena cava is normal in size with greater than 50%  respiratory variability, suggesting right atrial pressure of 3 mmHg.   Comparison(s): No significant change from prior study.   CT CHEST 11/27/2020: FINDINGS: Cardiovascular: Atherosclerosis of thoracic aorta is noted without aneurysm formation. Normal cardiac size. No pericardial effusion. Coronary artery calcifications are noted.   Mediastinum/Nodes: No enlarged mediastinal or axillary lymph nodes. Thyroid gland, trachea, and esophagus demonstrate no significant findings.   Lungs/Pleura: No pneumothorax or pleural effusion is noted. Minimal bibasilar scarring is noted. Peripheral  scarring is noted anteriorly and laterally in the right upper lobe. No definite acute abnormality is noted.   Upper Abdomen: No acute abnormality.   Musculoskeletal: No chest wall mass or suspicious bone lesions identified.   IMPRESSION: 1. Coronary artery calcifications are noted suggesting coronary artery disease. 2. Minimal bibasilar scarring is noted. Peripheral scarring is noted anteriorly and laterally in the right upper lobe. No definite acute abnormality is noted in the chest. 3. Aortic atherosclerosis.  TTE 02/20/2020:  1. Left ventricular ejection fraction, by estimation, is 60 to 65%. The  left ventricle has normal function. The left ventricle has no regional  wall motion abnormalities. There is mild left ventricular hypertrophy.  Left ventricular diastolic parameters  are consistent with Grade I diastolic dysfunction (impaired relaxation).   2. Right ventricular systolic function is normal. The right ventricular  size is normal. There is normal pulmonary artery systolic pressure. The  estimated right ventricular systolic pressure is 49.7 mmHg.   3. The mitral valve is abnormal. Mild mitral valve regurgitation.   4. The aortic valve is tricuspid. Aortic valve regurgitation is not  visualized.   5. Aortic dilatation noted. 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   6. The inferior vena cava is normal in size with greater than 50%  respiratory variability, suggesting right atrial pressure of 3 mmHg.    Coronary CTA 2020-03-05 25-49% RCA 25-49% left main Proximal LAD 50-69% Mid LAD 50-69% Minimal less than 25% left circumflex CT FFR negative  EKG:  EKG is personally reviewed 07/15/22: Sinus rhythm. LAD. Rate 70 bpm.  02/17/21: NSR with LAD, HR 70  Recent Labs: No results found for requested labs within last 365 days.   Recent Lipid Panel    Component Value Date/Time   CHOL 134 02/16/2022 0943   CHOL 164 05/20/2016 1127   TRIG 101  02/16/2022 0943   TRIG 88 05/20/2016 1127   HDL 51 02/16/2022 0943   HDL 55 05/20/2016 1127   CHOLHDL 2.6 02/16/2022 0943   CHOLHDL 3.0 05/20/2016 1127   LDLCALC 64 02/16/2022 0943   LDLCALC 91 05/20/2016 1127      Physical Exam:    VS:  BP 116/74   Pulse 70   Ht '5\' 4"'$  (1.626 m)   Wt 169 lb 6.4 oz (76.8 kg)   SpO2 98%   BMI 29.08 kg/m     Wt Readings from Last 3 Encounters:  07/15/22 169 lb 6.4 oz (76.8 kg)  05/03/22 169 lb 9 oz (76.9 kg)  01/13/22 180 lb 3.2 oz (81.7 kg)     GEN:  Well nourished, well developed in no acute distress HEENT: Normal NECK: No JVD; No carotid bruits CARDIAC: RRR, 2/6 systolic murmur at the RUSB, no rubs or gallops RESPIRATORY:  Clear to auscultation without rales, wheezing or rhonchi  ABDOMEN: Soft, non-tender, non-distended MUSCULOSKELETAL:  no edema SKIN: Warm and dry NEUROLOGIC:  Alert and oriented x 3 PSYCHIATRIC:  Normal affect   ASSESSMENT:    1. Coronary artery disease involving native coronary artery of native heart without angina pectoris   2. Medication management   3. Diabetes mellitus without complication ([place])   4. Pure hypercholesterolemia   5. Hyperlipidemia LDL goal <70   6. Statin intolerance   7. CAD in native artery   8. Essential (primary) hypertension   9. Mixed hyperlipidemia   10. Dilation of aorta (HCC)   11. PVC (premature ventricular contraction)   12. SVT (supraventricular tachycardia)   13. Ascending aorta dilatation (HCC)   14. Nonobstructive atherosclerosis of coronary artery      PLAN:    In order of problems listed above:  #PVCs/brief SVT Cardiac monitor 01/2020 with 11.3% PVC burden. TTE with normal LVEF, no significant valve disease, normal RV. No significant palpitations. Doing well on metop. -Continue metop '25mg'$  XL Andrea   #Nonobstructive CAD Coronary CTA 02/2020 with 25-50% RCA, 25-50% LM, 50-70% pLAD, 50-70%mLAD, <25% LCx. FFR negative. No anginal  symptoms.  -Continue ASA '81mg'$   Andrea -Continue home Crestor '40mg'$  Andrea and Zetia '10mg'$  Andrea  #HLD: -Continue crestor '40mg'$  Andrea and zetia '10mg'$  Andrea -Check lipids today -Goal LDL<70  #LE Edema: Improved.  -Continue HCTZ '25mg'$  Andrea  #Mechanical fall with left hip fracture: S/p ORIF. Doing well.  #HTN: Well controlled and at goal <130/90. -Continue amlodipine-benzapril '5mg'$ -'40mg'$  Andrea -Continue HCTZ '25mg'$  Andrea  #Ascending aortic dilation: Stable on TTE in 06/2021 measuring 69m. -Serial monitoring with repeat TTE yearly  #DMII: On ozempic but having issues with cost. Will discuss with Pharm D.  Follow-up: 6 months.  Medication Adjustments/Labs and Tests Ordered: Current medicines are reviewed at length with the patient today.  Concerns regarding medicines are outlined above.   Orders Placed This Encounter  Procedures   Lipid Profile   Basic metabolic panel   HgB AZ6X  TSH   EKG 12-Lead   ECHOCARDIOGRAM COMPLETE   Meds ordered this encounter  Medications   rosuvastatin (CRESTOR) 20 MG tablet    Sig: Take 1 tablet (20 mg total) by mouth Andrea.    Dispense:  90 tablet    Refill:  3   potassium chloride SA (KLOR-CON M) 20 MEQ tablet    Sig: Take 1 tablet (20 mEq total) by mouth Andrea.    Dispense:  90 tablet    Refill:  3    Dose increase-take 20 mEq Andrea while on lasix   ezetimibe (ZETIA) 10 MG tablet    Sig: Take 1 tablet (10 mg total) by mouth Andrea.    Dispense:  90 tablet    Refill:  3   amLODipine-benazepril (LOTREL) 5-40 MG capsule    Sig: Take 1 capsule by mouth Andrea.    Dispense:  90 capsule    Refill:  2   hydrochlorothiazide (HYDRODIURIL) 25 MG tablet    Sig: Take 1 tablet (25 mg total) by mouth Andrea.    Dispense:  90 tablet    Refill:  2   Patient Instructions  Medication Instructions:   Your physician recommends that you continue on your current medications as directed. Please refer to the Current Medication list given to you today.  *If you need a refill on your  cardiac medications before your next appointment, please call your pharmacy*   Lab Work:  TODAY--LIPIDS, BMET, HEMOGLOBIN A1C, AND TSH  If you have labs (blood work) drawn today and your tests are completely normal, you will receive your results only by: [place](if you have MyChart) OR A paper copy in the mail If you have any lab test that is abnormal or we need to change your treatment, we will call you to review the results.   Testing/Procedures:  Your physician has requested that you have an echocardiogram. Echocardiography is a painless test that uses sound waves to create images of your heart. It provides your doctor with information about the size and shape of your heart and how well your heart's chambers and valves are working. This procedure takes approximately one hour. There are no restrictions for this procedure.    Follow-Up: [facility] you and your health needs are our priority.  As part of our continuing mission to provide you with exceptional heart care, we have created designated Provider Care Teams.  These Care Teams include your primary Cardiologist (physician) and Advanced Practice Providers (APPs -  Physician Assistants and Nurse Practitioners) who all work together to provide you with the care you need, when you need  it.  We recommend signing up for the patient portal called "MyChart".  Sign up information is provided on this After Visit Summary.  MyChart is used to connect with patients for Virtual Visits (Telemedicine).  Patients are able to view lab/test results, encounter notes, upcoming appointments, etc.  Non-urgent messages can be sent to your provider as well.   To learn more about what you can do with MyChart, go to [email or web address].    Your next appointment:   6 month(s)  The format for your next appointment:   In Person  Provider:   Freada Bergeron, MD      Important Information About Sugar        I,Mathew  Stumpf,acting as a scribe for Freada Bergeron, MD.,have documented all relevant documentation on the behalf of Freada Bergeron, MD,as directed by  Freada Bergeron, MD while in the presence of Freada Bergeron, MD.   I, Freada Bergeron, MD, have reviewed all documentation for this visit. The documentation on 07/15/22 for the exam, diagnosis, procedures, and orders are all accurate and complete.   Signed, Freada Bergeron, MD  07/15/2022 9:27 AM    [place]

## 2022-07-15 NOTE — Patient Instructions (Signed)
Medication Instructions:   Your physician recommends that you continue on your current medications as directed. Please refer to the Current Medication list given to you today.  *If you need a refill on your cardiac medications before your next appointment, please call your pharmacy*   Lab Work:  TODAY--LIPIDS, BMET, HEMOGLOBIN A1C, AND TSH  If you have labs (blood work) drawn today and your tests are completely normal, you will receive your results only by: [place] (if you have MyChart) OR A paper copy in the mail If you have any lab test that is abnormal or we need to change your treatment, we will call you to review the results.   Testing/Procedures:  Your physician has requested that you have an echocardiogram. Echocardiography is a painless test that uses sound waves to create images of your heart. It provides your doctor with information about the size and shape of your heart and how well your heart's chambers and valves are working. This procedure takes approximately one hour. There are no restrictions for this procedure.    Follow-Up: At [facility], you and your health needs are our priority.  As part of our continuing mission to provide you with exceptional heart care, we have created designated Provider Care Teams.  These Care Teams include your primary Cardiologist (physician) and Advanced Practice Providers (APPs -  Physician Assistants and Nurse Practitioners) who all work together to provide you with the care you need, when you need it.  We recommend signing up for the patient portal called "MyChart".  Sign up information is provided on this After Visit Summary.  MyChart is used to connect with patients for Virtual Visits (Telemedicine).  Patients are able to view lab/test results, encounter notes, upcoming appointments, etc.  Non-urgent messages can be sent to your provider as well.   To learn more about what you can do with MyChart, go to  [email or web address].    Your next appointment:   6 month(s)  The format for your next appointment:   In Person  Provider:   Freada Bergeron, MD      Important Information About Sugar

## 2022-07-16 LAB — LIPID PANEL
Chol/HDL Ratio: 2.4 ratio (ref 0.0–4.4)
Cholesterol, Total: 136 mg/dL (ref 100–199)
HDL: 57 mg/dL (ref >39)
LDL Chol Calc (NIH): 60 mg/dL (ref 0–99)
Triglycerides: 107 mg/dL (ref 0–149)
VLDL Cholesterol Cal: 19 mg/dL (ref 5–40)

## 2022-07-16 LAB — BASIC METABOLIC PANEL
BUN/Creatinine Ratio: 21 (ref 12–28)
BUN: 13 mg/dL (ref 8–27)
CO2: 23 mmol/L (ref 20–29)
Calcium: 10.5 mg/dL — ABNORMAL HIGH (ref 8.7–10.3)
Chloride: 101 mmol/L (ref 96–106)
Creatinine, Ser: 0.61 mg/dL (ref 0.57–1.00)
Glucose: 113 mg/dL — ABNORMAL HIGH (ref 70–99)
Potassium: 4.1 mmol/L (ref 3.5–5.2)
Sodium: 139 mmol/L (ref 134–144)
eGFR: 91 mL/min/{1.73_m2} (ref >59)

## 2022-07-16 LAB — HEMOGLOBIN A1C
Est. average glucose Bld gHb Est-mCnc: 128 mg/dL
Hgb A1c MFr Bld: 6.1 % — ABNORMAL HIGH (ref 4.8–5.6)

## 2022-07-16 LAB — TSH: TSH: 2.04 u[IU]/mL (ref 0.450–4.500)

## 2022-07-29 DIAGNOSIS — N952 Postmenopausal atrophic vaginitis: Secondary | ICD-10-CM | POA: Diagnosis not present

## 2022-07-29 DIAGNOSIS — Z853 Personal history of malignant neoplasm of breast: Secondary | ICD-10-CM | POA: Diagnosis not present

## 2022-07-29 DIAGNOSIS — Z9189 Other specified personal risk factors, not elsewhere classified: Secondary | ICD-10-CM | POA: Diagnosis not present

## 2022-07-30 ENCOUNTER — Ambulatory Visit (HOSPITAL_COMMUNITY): Payer: PPO | Attending: Cardiology

## 2022-07-30 DIAGNOSIS — I77819 Aortic ectasia, unspecified site: Secondary | ICD-10-CM

## 2022-07-30 DIAGNOSIS — I340 Nonrheumatic mitral (valve) insufficiency: Secondary | ICD-10-CM | POA: Diagnosis not present

## 2022-07-30 LAB — ECHOCARDIOGRAM COMPLETE
Area-P 1/2: 2.52 cm2
S' Lateral: 3.1 cm

## 2022-07-30 MED ORDER — PERFLUTREN LIPID MICROSPHERE
1.0000 mL | INTRAVENOUS | Status: AC | PRN
  Administered 2022-07-30: 2 mL via INTRAVENOUS

## 2022-08-02 ENCOUNTER — Telehealth: Payer: Self-pay | Admitting: *Deleted

## 2022-08-02 DIAGNOSIS — I77819 Aortic ectasia, unspecified site: Secondary | ICD-10-CM

## 2022-08-02 DIAGNOSIS — I340 Nonrheumatic mitral (valve) insufficiency: Secondary | ICD-10-CM

## 2022-08-02 NOTE — Telephone Encounter (Signed)
-----   Message from Freada Bergeron, MD sent at 07/30/2022  9:06 PM EDT ----- Her echo shows normal pumping function. There is mild leakiness of the mitral valve with mild leaflet prolapse which we will continue to monitor with serial echoes.There is mild dilation of the aorta which we again will monitor with yearly echoes.

## 2022-08-02 NOTE — Telephone Encounter (Signed)
The patient has been notified of the result and verbalized understanding.  All questions (if any) were answered.  Pt aware I will go ahead and place the order for repeat echo in one year in the system, and send a message to our Echo Scheduler to call her back and arrange this appt for that time.   Pt verbalized understanding and agrees with this plan.

## 2022-08-05 DIAGNOSIS — E7800 Pure hypercholesterolemia, unspecified: Secondary | ICD-10-CM | POA: Diagnosis not present

## 2022-08-05 DIAGNOSIS — I10 Essential (primary) hypertension: Secondary | ICD-10-CM | POA: Diagnosis not present

## 2022-08-05 DIAGNOSIS — E1169 Type 2 diabetes mellitus with other specified complication: Secondary | ICD-10-CM | POA: Diagnosis not present

## 2022-08-10 DIAGNOSIS — I10 Essential (primary) hypertension: Secondary | ICD-10-CM | POA: Diagnosis not present

## 2022-08-10 DIAGNOSIS — E785 Hyperlipidemia, unspecified: Secondary | ICD-10-CM | POA: Diagnosis not present

## 2022-08-10 DIAGNOSIS — Z6829 Body mass index (BMI) 29.0-29.9, adult: Secondary | ICD-10-CM | POA: Diagnosis not present

## 2022-08-11 DIAGNOSIS — Z6831 Body mass index (BMI) 31.0-31.9, adult: Secondary | ICD-10-CM | POA: Diagnosis not present

## 2022-08-11 DIAGNOSIS — E1169 Type 2 diabetes mellitus with other specified complication: Secondary | ICD-10-CM | POA: Diagnosis not present

## 2022-08-11 DIAGNOSIS — Z0000 Encounter for general adult medical examination without abnormal findings: Secondary | ICD-10-CM | POA: Diagnosis not present

## 2022-08-11 DIAGNOSIS — I10 Essential (primary) hypertension: Secondary | ICD-10-CM | POA: Diagnosis not present

## 2022-08-11 DIAGNOSIS — M2560 Stiffness of unspecified joint, not elsewhere classified: Secondary | ICD-10-CM | POA: Diagnosis not present

## 2022-08-20 ENCOUNTER — Other Ambulatory Visit: Payer: Self-pay | Admitting: Hematology and Oncology

## 2022-08-21 ENCOUNTER — Encounter (HOSPITAL_COMMUNITY): Payer: Self-pay

## 2022-08-21 ENCOUNTER — Emergency Department (HOSPITAL_COMMUNITY): Payer: No Typology Code available for payment source

## 2022-08-21 ENCOUNTER — Emergency Department (HOSPITAL_COMMUNITY)
Admission: EM | Admit: 2022-08-21 | Discharge: 2022-08-21 | Disposition: A | Discharge status: Home / Self Care | Payer: No Typology Code available for payment source | Attending: Emergency Medicine | Admitting: Emergency Medicine

## 2022-08-21 DIAGNOSIS — S0990XA Unspecified injury of head, initial encounter: Secondary | ICD-10-CM | POA: Insufficient documentation

## 2022-08-21 DIAGNOSIS — G319 Degenerative disease of nervous system, unspecified: Secondary | ICD-10-CM | POA: Diagnosis not present

## 2022-08-21 DIAGNOSIS — M25572 Pain in left ankle and joints of left foot: Secondary | ICD-10-CM | POA: Diagnosis not present

## 2022-08-21 DIAGNOSIS — Z7984 Long term (current) use of oral hypoglycemic drugs: Secondary | ICD-10-CM | POA: Diagnosis not present

## 2022-08-21 DIAGNOSIS — M7989 Other specified soft tissue disorders: Secondary | ICD-10-CM | POA: Diagnosis not present

## 2022-08-21 DIAGNOSIS — Z79899 Other long term (current) drug therapy: Secondary | ICD-10-CM | POA: Insufficient documentation

## 2022-08-21 DIAGNOSIS — S199XXA Unspecified injury of neck, initial encounter: Secondary | ICD-10-CM | POA: Diagnosis not present

## 2022-08-21 DIAGNOSIS — I10 Essential (primary) hypertension: Secondary | ICD-10-CM | POA: Diagnosis not present

## 2022-08-21 DIAGNOSIS — M545 Low back pain, unspecified: Secondary | ICD-10-CM | POA: Diagnosis not present

## 2022-08-21 DIAGNOSIS — R0781 Pleurodynia: Secondary | ICD-10-CM | POA: Diagnosis not present

## 2022-08-21 DIAGNOSIS — E119 Type 2 diabetes mellitus without complications: Secondary | ICD-10-CM | POA: Insufficient documentation

## 2022-08-21 DIAGNOSIS — T68XXXA Hypothermia, initial encounter: Secondary | ICD-10-CM | POA: Diagnosis not present

## 2022-08-21 DIAGNOSIS — G4489 Other headache syndrome: Secondary | ICD-10-CM | POA: Diagnosis not present

## 2022-08-21 DIAGNOSIS — R609 Edema, unspecified: Secondary | ICD-10-CM | POA: Diagnosis not present

## 2022-08-21 DIAGNOSIS — S2241XA Multiple fractures of ribs, right side, initial encounter for closed fracture: Secondary | ICD-10-CM | POA: Insufficient documentation

## 2022-08-21 DIAGNOSIS — S20301A Unspecified superficial injuries of right front wall of thorax, initial encounter: Secondary | ICD-10-CM | POA: Diagnosis present

## 2022-08-21 DIAGNOSIS — Y92410 Unspecified street and highway as the place of occurrence of the external cause: Secondary | ICD-10-CM | POA: Insufficient documentation

## 2022-08-21 DIAGNOSIS — Z7982 Long term (current) use of aspirin: Secondary | ICD-10-CM | POA: Insufficient documentation

## 2022-08-21 DIAGNOSIS — R2242 Localized swelling, mass and lump, left lower limb: Secondary | ICD-10-CM | POA: Diagnosis not present

## 2022-08-21 MED ORDER — HYDROCODONE-ACETAMINOPHEN 5-325 MG PO TABS
1.0000 | ORAL_TABLET | Freq: Once | ORAL | Status: AC
  Administered 2022-08-21: 1 via ORAL
  Filled 2022-08-21: qty 1

## 2022-08-21 MED ORDER — ACETAMINOPHEN 500 MG PO TABS: 1000.0000 mg | ORAL_TABLET | Freq: Once | ORAL | Status: DC

## 2022-08-21 MED ORDER — HYDROCODONE-ACETAMINOPHEN 5-325 MG PO TABS: 1.0000 | ORAL_TABLET | Freq: Four times a day (QID) | ORAL | 0 refills | Status: DC | PRN
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
## 2022-08-23 DIAGNOSIS — M179 Osteoarthritis of knee, unspecified: Secondary | ICD-10-CM | POA: Diagnosis not present

## 2022-08-23 DIAGNOSIS — M1990 Unspecified osteoarthritis, unspecified site: Secondary | ICD-10-CM | POA: Diagnosis not present

## 2022-08-23 DIAGNOSIS — E1169 Type 2 diabetes mellitus with other specified complication: Secondary | ICD-10-CM | POA: Diagnosis not present

## 2022-08-23 DIAGNOSIS — I10 Essential (primary) hypertension: Secondary | ICD-10-CM | POA: Diagnosis not present

## 2022-08-25 ENCOUNTER — Telehealth: Payer: Self-pay

## 2022-08-25 NOTE — Telephone Encounter (Signed)
        Patient  visited [facility] on 11/11   Telephone encounter attempt :  1st  A HIPAA compliant voice message was left requesting a return call.  Instructed patient to call back    [place]  [phone] [address], [place], [place] [address] Phone: [phone] Email: [email or web address]

## 2022-09-08 ENCOUNTER — Other Ambulatory Visit: Payer: Self-pay | Admitting: Family Medicine

## 2022-09-08 DIAGNOSIS — M810 Age-related osteoporosis without current pathological fracture: Secondary | ICD-10-CM

## 2022-09-10 DIAGNOSIS — Z6831 Body mass index (BMI) 31.0-31.9, adult: Secondary | ICD-10-CM | POA: Diagnosis not present

## 2022-09-10 DIAGNOSIS — N644 Mastodynia: Secondary | ICD-10-CM | POA: Diagnosis not present

## 2022-09-10 DIAGNOSIS — S2231XD Fracture of one rib, right side, subsequent encounter for fracture with routine healing: Secondary | ICD-10-CM | POA: Diagnosis not present

## 2022-09-17 ENCOUNTER — Other Ambulatory Visit: Payer: Self-pay | Admitting: Family Medicine

## 2022-09-24 DIAGNOSIS — H26493 Other secondary cataract, bilateral: Secondary | ICD-10-CM | POA: Diagnosis not present

## 2022-09-24 DIAGNOSIS — E119 Type 2 diabetes mellitus without complications: Secondary | ICD-10-CM | POA: Diagnosis not present

## 2022-09-24 DIAGNOSIS — H524 Presbyopia: Secondary | ICD-10-CM | POA: Diagnosis not present

## 2022-09-24 DIAGNOSIS — Z961 Presence of intraocular lens: Secondary | ICD-10-CM | POA: Diagnosis not present

## 2022-10-07 DIAGNOSIS — Z6829 Body mass index (BMI) 29.0-29.9, adult: Secondary | ICD-10-CM | POA: Diagnosis not present

## 2022-10-07 DIAGNOSIS — M79642 Pain in left hand: Secondary | ICD-10-CM | POA: Diagnosis not present

## 2022-10-07 DIAGNOSIS — M24522 Contracture, left elbow: Secondary | ICD-10-CM | POA: Diagnosis not present

## 2022-10-07 DIAGNOSIS — M1991 Primary osteoarthritis, unspecified site: Secondary | ICD-10-CM | POA: Diagnosis not present

## 2022-10-07 DIAGNOSIS — M2540 Effusion, unspecified joint: Secondary | ICD-10-CM | POA: Diagnosis not present

## 2022-10-07 DIAGNOSIS — M79641 Pain in right hand: Secondary | ICD-10-CM | POA: Diagnosis not present

## 2022-10-07 DIAGNOSIS — M25522 Pain in left elbow: Secondary | ICD-10-CM | POA: Diagnosis not present

## 2022-10-07 DIAGNOSIS — M2560 Stiffness of unspecified joint, not elsewhere classified: Secondary | ICD-10-CM | POA: Diagnosis not present

## 2022-10-07 DIAGNOSIS — E663 Overweight: Secondary | ICD-10-CM | POA: Diagnosis not present

## 2022-10-27 DIAGNOSIS — E663 Overweight: Secondary | ICD-10-CM | POA: Diagnosis not present

## 2022-10-27 DIAGNOSIS — M1991 Primary osteoarthritis, unspecified site: Secondary | ICD-10-CM | POA: Diagnosis not present

## 2022-10-27 DIAGNOSIS — Z6829 Body mass index (BMI) 29.0-29.9, adult: Secondary | ICD-10-CM | POA: Diagnosis not present

## 2022-10-27 DIAGNOSIS — M2540 Effusion, unspecified joint: Secondary | ICD-10-CM | POA: Diagnosis not present

## 2022-10-27 DIAGNOSIS — M2560 Stiffness of unspecified joint, not elsewhere classified: Secondary | ICD-10-CM | POA: Diagnosis not present

## 2022-10-27 DIAGNOSIS — M25522 Pain in left elbow: Secondary | ICD-10-CM | POA: Diagnosis not present

## 2022-10-27 DIAGNOSIS — M24522 Contracture, left elbow: Secondary | ICD-10-CM | POA: Diagnosis not present

## 2022-10-27 DIAGNOSIS — M79641 Pain in right hand: Secondary | ICD-10-CM | POA: Diagnosis not present

## 2022-10-27 DIAGNOSIS — M79642 Pain in left hand: Secondary | ICD-10-CM | POA: Diagnosis not present

## 2022-10-29 DIAGNOSIS — M25661 Stiffness of right knee, not elsewhere classified: Secondary | ICD-10-CM | POA: Diagnosis not present

## 2022-10-29 DIAGNOSIS — T8484XA Pain due to internal orthopedic prosthetic devices, implants and grafts, initial encounter: Secondary | ICD-10-CM | POA: Diagnosis not present

## 2022-10-29 DIAGNOSIS — Z96653 Presence of artificial knee joint, bilateral: Secondary | ICD-10-CM | POA: Diagnosis not present

## 2022-10-29 DIAGNOSIS — M25562 Pain in left knee: Secondary | ICD-10-CM | POA: Diagnosis not present

## 2022-10-29 DIAGNOSIS — M25662 Stiffness of left knee, not elsewhere classified: Secondary | ICD-10-CM | POA: Diagnosis not present

## 2022-10-29 DIAGNOSIS — M25561 Pain in right knee: Secondary | ICD-10-CM | POA: Diagnosis not present

## 2022-11-01 DIAGNOSIS — Z170 Estrogen receptor positive status [ER+]: Secondary | ICD-10-CM | POA: Diagnosis not present

## 2022-11-01 DIAGNOSIS — C50911 Malignant neoplasm of unspecified site of right female breast: Secondary | ICD-10-CM | POA: Diagnosis not present

## 2022-11-03 DIAGNOSIS — M25561 Pain in right knee: Secondary | ICD-10-CM | POA: Diagnosis not present

## 2022-11-03 DIAGNOSIS — M25562 Pain in left knee: Secondary | ICD-10-CM | POA: Diagnosis not present

## 2022-11-10 DIAGNOSIS — M25561 Pain in right knee: Secondary | ICD-10-CM | POA: Diagnosis not present

## 2022-11-10 DIAGNOSIS — M25552 Pain in left hip: Secondary | ICD-10-CM | POA: Diagnosis not present

## 2022-11-10 DIAGNOSIS — M25562 Pain in left knee: Secondary | ICD-10-CM | POA: Diagnosis not present

## 2022-11-12 DIAGNOSIS — M25561 Pain in right knee: Secondary | ICD-10-CM | POA: Diagnosis not present

## 2022-11-12 DIAGNOSIS — M25562 Pain in left knee: Secondary | ICD-10-CM | POA: Diagnosis not present

## 2022-11-17 DIAGNOSIS — M25562 Pain in left knee: Secondary | ICD-10-CM | POA: Diagnosis not present

## 2022-11-17 DIAGNOSIS — M25561 Pain in right knee: Secondary | ICD-10-CM | POA: Diagnosis not present

## 2022-11-19 DIAGNOSIS — M25561 Pain in right knee: Secondary | ICD-10-CM | POA: Diagnosis not present

## 2022-11-19 DIAGNOSIS — M25562 Pain in left knee: Secondary | ICD-10-CM | POA: Diagnosis not present

## 2022-11-22 DIAGNOSIS — H6122 Impacted cerumen, left ear: Secondary | ICD-10-CM | POA: Diagnosis not present

## 2022-11-24 DIAGNOSIS — I10 Essential (primary) hypertension: Secondary | ICD-10-CM | POA: Diagnosis not present

## 2022-11-24 DIAGNOSIS — C50919 Malignant neoplasm of unspecified site of unspecified female breast: Secondary | ICD-10-CM | POA: Diagnosis not present

## 2022-11-24 DIAGNOSIS — M25561 Pain in right knee: Secondary | ICD-10-CM | POA: Diagnosis not present

## 2022-11-24 DIAGNOSIS — Z6831 Body mass index (BMI) 31.0-31.9, adult: Secondary | ICD-10-CM | POA: Diagnosis not present

## 2022-11-24 DIAGNOSIS — E119 Type 2 diabetes mellitus without complications: Secondary | ICD-10-CM | POA: Diagnosis not present

## 2022-11-24 DIAGNOSIS — N3941 Urge incontinence: Secondary | ICD-10-CM | POA: Diagnosis not present

## 2022-11-24 DIAGNOSIS — M25562 Pain in left knee: Secondary | ICD-10-CM | POA: Diagnosis not present

## 2022-11-26 DIAGNOSIS — M25561 Pain in right knee: Secondary | ICD-10-CM | POA: Diagnosis not present

## 2022-11-26 DIAGNOSIS — M25562 Pain in left knee: Secondary | ICD-10-CM | POA: Diagnosis not present

## 2022-12-01 DIAGNOSIS — M25562 Pain in left knee: Secondary | ICD-10-CM | POA: Diagnosis not present

## 2022-12-01 DIAGNOSIS — M25561 Pain in right knee: Secondary | ICD-10-CM | POA: Diagnosis not present

## 2022-12-08 DIAGNOSIS — M25562 Pain in left knee: Secondary | ICD-10-CM | POA: Diagnosis not present

## 2022-12-08 DIAGNOSIS — M25561 Pain in right knee: Secondary | ICD-10-CM | POA: Diagnosis not present

## 2022-12-15 DIAGNOSIS — M25562 Pain in left knee: Secondary | ICD-10-CM | POA: Diagnosis not present

## 2022-12-15 DIAGNOSIS — M25561 Pain in right knee: Secondary | ICD-10-CM | POA: Diagnosis not present

## 2022-12-27 DIAGNOSIS — M25562 Pain in left knee: Secondary | ICD-10-CM | POA: Diagnosis not present

## 2022-12-27 DIAGNOSIS — M25561 Pain in right knee: Secondary | ICD-10-CM | POA: Diagnosis not present
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
## 2022-12-31 ENCOUNTER — Encounter: Payer: Self-pay | Admitting: Cardiology

## 2022-12-31 ENCOUNTER — Ambulatory Visit: Payer: PPO | Attending: Cardiology | Admitting: Cardiology

## 2022-12-31 VITALS — BP 136/72 | HR 63 | Ht 64.5 in | Wt 173.6 lb

## 2022-12-31 DIAGNOSIS — I340 Nonrheumatic mitral (valve) insufficiency: Secondary | ICD-10-CM | POA: Diagnosis not present

## 2022-12-31 DIAGNOSIS — I77819 Aortic ectasia, unspecified site: Secondary | ICD-10-CM

## 2022-12-31 DIAGNOSIS — I493 Ventricular premature depolarization: Secondary | ICD-10-CM | POA: Diagnosis not present

## 2022-12-31 DIAGNOSIS — E782 Mixed hyperlipidemia: Secondary | ICD-10-CM

## 2022-12-31 DIAGNOSIS — E119 Type 2 diabetes mellitus without complications: Secondary | ICD-10-CM | POA: Diagnosis not present

## 2022-12-31 DIAGNOSIS — I2510 Atherosclerotic heart disease of native coronary artery without angina pectoris: Secondary | ICD-10-CM | POA: Diagnosis not present

## 2022-12-31 DIAGNOSIS — I10 Essential (primary) hypertension: Secondary | ICD-10-CM | POA: Diagnosis not present

## 2022-12-31 DIAGNOSIS — Z6831 Body mass index (BMI) 31.0-31.9, adult: Secondary | ICD-10-CM | POA: Diagnosis not present

## 2022-12-31 MED ORDER — DAPAGLIFLOZIN PROPANEDIOL 10 MG PO TABS: 10.0000 mg | ORAL_TABLET | Freq: Every day | ORAL | 0 refills | Status: DC

## 2022-12-31 MED ORDER — DAPAGLIFLOZIN PROPANEDIOL 10 MG PO TABS: 10.0000 mg | ORAL_TABLET | Freq: Every day | ORAL | 6 refills | Status: AC

## 2022-12-31 NOTE — Patient Instructions (Signed)
Medication Instructions:   START TAKING FARXIGA 10 MG BY MOUTH DAILY BEFORE BREAKFAST  *If you need a refill on your cardiac medications before your next appointment, please call your pharmacy*   Lab Work:  TODAY--LIPIDS AND HEMOGLOBIN A1C  If you have labs (blood work) drawn today and your tests are completely normal, you will receive your results only by: [place] (if you have MyChart) OR A paper copy in the mail If you have any lab test that is abnormal or we need to change your treatment, we will call you to review the results.    Follow-Up: At [facility], you and your health needs are our priority.  As part of our continuing mission to provide you with exceptional heart care, we have created designated Provider Care Teams.  These Care Teams include your primary Cardiologist (physician) and Advanced Practice Providers (APPs -  Physician Assistants and Nurse Practitioners) who all work together to provide you with the care you need, when you need it.  We recommend signing up for the patient portal called "MyChart".  Sign up information is provided on this After Visit Summary.  MyChart is used to connect with patients for Virtual Visits (Telemedicine).  Patients are able to view lab/test results, encounter notes, upcoming appointments, etc.  Non-urgent messages can be sent to your provider as well.   To learn more about what you can do with MyChart, go to [email or web address].    Your next appointment:   7 month(s)  Provider:   Freada Bergeron, MD

## 2023-01-01 LAB — LIPID PANEL
Chol/HDL Ratio: 2.5 ratio (ref 0.0–4.4)
Cholesterol, Total: 135 mg/dL (ref 100–199)
HDL: 55 mg/dL (ref >39)
LDL Chol Calc (NIH): 64 mg/dL (ref 0–99)
Triglycerides: 84 mg/dL (ref 0–149)
VLDL Cholesterol Cal: 16 mg/dL (ref 5–40)

## 2023-01-01 LAB — HEMOGLOBIN A1C
Est. average glucose Bld gHb Est-mCnc: 146 mg/dL
Hgb A1c MFr Bld: 6.7 % — ABNORMAL HIGH (ref 4.8–5.6)

## 2023-01-05 DIAGNOSIS — M25562 Pain in left knee: Secondary | ICD-10-CM | POA: Diagnosis not present

## 2023-01-05 DIAGNOSIS — M25561 Pain in right knee: Secondary | ICD-10-CM | POA: Diagnosis not present

## 2023-01-17 ENCOUNTER — Other Ambulatory Visit: Payer: Self-pay | Admitting: Hematology and Oncology

## 2023-01-17 DIAGNOSIS — M25561 Pain in right knee: Secondary | ICD-10-CM | POA: Diagnosis not present

## 2023-01-17 DIAGNOSIS — M25562 Pain in left knee: Secondary | ICD-10-CM | POA: Diagnosis not present

## 2023-01-19 ENCOUNTER — Telehealth: Payer: Self-pay | Admitting: Cardiology

## 2023-01-19 ENCOUNTER — Other Ambulatory Visit (HOSPITAL_COMMUNITY): Payer: Self-pay

## 2023-01-19 ENCOUNTER — Encounter: Payer: Self-pay | Admitting: *Deleted

## 2023-01-19 NOTE — Telephone Encounter (Signed)
Hey there! Looks like drug is not requiring prior authorization at this time. Let me loop RPH in to assist with patient assistance.

## 2023-01-19 NOTE — Telephone Encounter (Signed)
Pt c/o medication issue:  1. Name of Medication:   dapagliflozin propanediol (FARXIGA) 10 MG TABS tablet   2. How are you currently taking this medication (dosage and times per day)?   As prescribed  3. Are you having a reaction (difficulty breathing--STAT)?   No  4. What is your medication issue?   Patient stated she wants to get assistance to get this medication.

## 2023-01-19 NOTE — Telephone Encounter (Signed)
Patient can print and complete her section of the form at:  [email or web address]  And then bring in packet for Dr Shari Prows to sign the last sheet.

## 2023-01-19 NOTE — Telephone Encounter (Signed)
Pt states she already has the information in her email as to print off and fill out her portion of the pt assistance application, she just wanted to know who would be overseeing this process, when she drops the completed forms off by the office on next Monday/ Tuesday 4/15-4/16.    Advised the pt to complete her portion of the forms and drop them off next week, and they will get to our PA Nurse Larita Fife Via to further manage from there.  She is aware that Dr. Shari Prows will sign the provider page, but ultimately Larita Fife will oversee this whole process for her.   Pt is asking Larita Fife to call her as needed, or if she finds that she missed anything on the forms she was to complete.    Pt is also asking Larita Fife if once she brings her the forms next week, can she get some more samples of the Farxiga, for she only has enough to last her until about next Thursday.  Pt is aware that once she brings her completed portion of the application, we can provide her with samples thereafter and while the application is in its final completion process.   Pt is aware that I will forward this message to [facility] Via for further review and management, when she returns to the office on next Monday 4/15.  Pt verbalized understanding and agrees with this plan.  Pt was more than gracious for all the assistance provided.

## 2023-01-19 NOTE — Telephone Encounter (Signed)
Will send this message to our Rx prior auth pool (in absence of our Prior Auth Nurse for the next week) to see if they could further offer and assist this pt with farxiga PA/pt assistance.

## 2023-01-24 DIAGNOSIS — M25561 Pain in right knee: Secondary | ICD-10-CM | POA: Diagnosis not present

## 2023-01-24 DIAGNOSIS — M25562 Pain in left knee: Secondary | ICD-10-CM | POA: Diagnosis not present

## 2023-01-24 NOTE — Telephone Encounter (Signed)
Provider page signed and dated.   Faxed all forms to the contact information provided on cover sheet.   Will make PA Nurse aware of completion.

## 2023-01-24 NOTE — Telephone Encounter (Signed)
Confirmation fax received. 

## 2023-01-24 NOTE — Telephone Encounter (Signed)
**Note De-Identified Imanii Gosdin Obfuscation** I have completed the providers page of the pts [place] and [place] pt asst application for Farxiga assistance and have e-mailed all to Dr Devin Going nurse so she can obtain her signature, date it, and to fax all to [facility] and Me at the fax number written on the cover letter included.

## 2023-01-24 NOTE — Telephone Encounter (Signed)
Pt came into the office to return pt assistance application and pt was given 2 weeks of samples of Farxiga 10 mg tablets, until application is process. Application scanned to [employer], LPN, email.  Lot# [id number]   Exp: 06/10/2025  FYI

## 2023-01-26 NOTE — Telephone Encounter (Signed)
**Note De-Identified Mikeisha Lemonds Obfuscation** Letter received from [facility] and Me [employer] fax stating that they have approved the pt for Farxiga assistance until 10/11/2023. ID: [id number]  The letter states that the pt is aware of this approval as well.

## 2023-01-31 DIAGNOSIS — M25562 Pain in left knee: Secondary | ICD-10-CM | POA: Diagnosis not present

## 2023-01-31 DIAGNOSIS — M25561 Pain in right knee: Secondary | ICD-10-CM | POA: Diagnosis not present

## 2023-02-07 DIAGNOSIS — M25562 Pain in left knee: Secondary | ICD-10-CM | POA: Diagnosis not present

## 2023-02-07 DIAGNOSIS — M25561 Pain in right knee: Secondary | ICD-10-CM | POA: Diagnosis not present

## 2023-02-10 ENCOUNTER — Ambulatory Visit: Payer: PPO

## 2023-03-21 ENCOUNTER — Ambulatory Visit
Admission: RE | Admit: 2023-03-21 | Discharge: 2023-03-21 | Disposition: A | Discharge status: Home / Self Care | Payer: PPO | Source: Ambulatory Visit | Attending: Family Medicine | Admitting: Family Medicine

## 2023-03-21 DIAGNOSIS — Z1231 Encounter for screening mammogram for malignant neoplasm of breast: Secondary | ICD-10-CM

## 2023-03-23 ENCOUNTER — Other Ambulatory Visit: Payer: Self-pay | Admitting: Family Medicine

## 2023-03-23 DIAGNOSIS — Z1231 Encounter for screening mammogram for malignant neoplasm of breast: Secondary | ICD-10-CM

## 2023-03-25 ENCOUNTER — Telehealth: Payer: Self-pay | Admitting: Cardiology

## 2023-03-25 NOTE — Telephone Encounter (Signed)
Pt is requesting a callback from [facility], she didn't specify what it was in regards to but she said it would only take a minute. Please advise

## 2023-03-25 NOTE — Telephone Encounter (Signed)
Shasmeen, [place] - 03/25/2023 10:04 AM [facility]  Sent: Caleen Essex March 25, 2023 11:28 AM  To: Loa Socks, LPN         Message  Patient is scheduled

## 2023-03-25 NOTE — Telephone Encounter (Signed)
Pt is calling to see who Dr. Shari Prows recommends take over her cardiac care, since she is leaving.  Spoke with Dr. Shari Prows about this and she recommended that this pt be scheduled to see Dr. Cristal Deer at our [facility] location.   Pt is aware that I will have our scheduling team reach out to her soon to arrange her 6 month appt with Dr. Cristal Deer at [facility].  Pt verbalized understanding and agrees with this plan.

## 2023-04-11 DIAGNOSIS — E7800 Pure hypercholesterolemia, unspecified: Secondary | ICD-10-CM | POA: Diagnosis not present

## 2023-04-11 DIAGNOSIS — E119 Type 2 diabetes mellitus without complications: Secondary | ICD-10-CM | POA: Diagnosis not present

## 2023-04-11 DIAGNOSIS — Z6831 Body mass index (BMI) 31.0-31.9, adult: Secondary | ICD-10-CM | POA: Diagnosis not present

## 2023-04-11 DIAGNOSIS — I10 Essential (primary) hypertension: Secondary | ICD-10-CM | POA: Diagnosis not present

## 2023-04-11 DIAGNOSIS — E1169 Type 2 diabetes mellitus with other specified complication: Secondary | ICD-10-CM | POA: Diagnosis not present

## 2023-05-04 NOTE — Progress Notes (Signed)
Patient Care Team: Daisy Floro, MD as PCP - General (Family Medicine) Meriam Sprague, MD as PCP - Cardiology (Cardiology) Serena Croissant, MD as Consulting Physician (Hematology and Oncology) Griselda Miner, MD as Consulting Physician (General Surgery) Dorothy Puffer, MD as Consulting Physician (Radiation Oncology)  DIAGNOSIS: No diagnosis found.  SUMMARY OF ONCOLOGIC HISTORY: Oncology History  Malignant neoplasm of lower-outer quadrant of right breast of female, estrogen receptor positive (HCC)  02/15/2020 Initial Diagnosis   Screening mammogram detected right breast mass. 0.4cm mass at the 6:30 position and no axillary adenopathy. Biopsy: invasive ductal carcinoma, grade 3, HER-2 negative (1+), ER+ 95%, PR- 0%, KI67 40%.   03/20/2020 Surgery   Right lumpectomy Carolynne Edouard) [id number]): invasive and in situ ductal carcinoma, 0.5cm, clear margins, 4 right axillary lymph nodes negative    04/21/2020 - 05/16/2020 Radiation Therapy   The patient initially received a dose of 42.56 Gy in 16 fractions to the breast using whole-breast tangent fields. This was delivered using a 3-D conformal technique. The patient then received a boost to the seroma. This delivered an additional 8 Gy in 72fractions using a 3 field photon technique due to the depth of the seroma. The total dose was 50.56 Gy.   05/2020 - 05/2025 Anti-estrogen oral therapy   Anastrozole     CHIEF COMPLIANT:   INTERVAL HISTORY: Andrea Farmer is a   ALLERGIES:  is allergic to tramadol.  MEDICATIONS:  Current Outpatient Medications  Medication Sig Dispense Refill   acetaminophen (TYLENOL) 500 MG tablet Take 1,000 mg by mouth every 6 (six) hours as needed for moderate pain.     amLODipine-benazepril (LOTREL) 5-40 MG capsule Take 1 capsule by mouth daily. 90 capsule 2   aspirin EC 81 MG tablet Take 1 tablet (81 mg total) by mouth every other day. Swallow whole. 90 tablet 3   Calcium Carbonate-Vitamin D 600-400 MG-UNIT tablet  Take 1 tablet by mouth 3 (three) times a week.      Coenzyme Q10 (CO Q 10 PO) Take 200 mg by mouth every other day.      dapagliflozin propanediol (FARXIGA) 10 MG TABS tablet Take 1 tablet (10 mg total) by mouth daily before breakfast. 30 tablet 6   dapagliflozin propanediol (FARXIGA) 10 MG TABS tablet Take 1 tablet (10 mg total) by mouth daily before breakfast. 21 tablet 0   dapagliflozin propanediol (FARXIGA) 10 MG TABS tablet Take 1 tablet (10 mg total) by mouth daily before breakfast. 7 tablet 0   ezetimibe (ZETIA) 10 MG tablet Take 1 tablet (10 mg total) by mouth daily. 90 tablet 3   hydrochlorothiazide (HYDRODIURIL) 25 MG tablet Take 1 tablet (25 mg total) by mouth daily. (Patient taking differently: Take 25 mg by mouth daily. Take 1/2 daily) 90 tablet 2   Hyprom-Naphaz-Polysorb-Zn Sulf (CLEAR EYES COMPLETE OP) Place 1 drop into both eyes daily as needed (dry eyes).     letrozole (FEMARA) 2.5 MG tablet TAKE 1 TABLET BY MOUTH EVERY DAY 90 tablet 1   metFORMIN (GLUCOPHAGE-XR) 500 MG 24 hr tablet metformin ER 500 mg tablet,extended release 24 hr     mometasone (NASONEX) 50 MCG/ACT nasal spray Place 2 sprays into the nose 3 (three) times a week.     Multiple Vitamin (MULTIVITAMIN WITH MINERALS) TABS tablet Take 1 tablet by mouth 3 (three) times a week.      ONETOUCH ULTRA test strip daily. for testing blood sugar     polyethylene glycol (MIRALAX / GLYCOLAX) 17 g  packet Take 17 g by mouth daily.     potassium chloride SA (KLOR-CON M) 20 MEQ tablet Take 1 tablet (20 mEq total) by mouth daily. 90 tablet 3   Probiotic Product (ALIGN) 4 MG CAPS Take 4 mg by mouth daily.     rosuvastatin (CRESTOR) 20 MG tablet Take 1 tablet (20 mg total) by mouth daily. 90 tablet 3   TURMERIC PO Take 500 mg by mouth every other day.     vitamin E 400 UNIT capsule Take 400 Units by mouth every other day.     No current facility-administered medications for this visit.    PHYSICAL EXAMINATION: ECOG PERFORMANCE  STATUS: {CHL ONC ECOG PS:[phone]}  There were no vitals filed for this visit. There were no vitals filed for this visit.  BREAST:*** No palpable masses or nodules in either right or left breasts. No palpable axillary supraclavicular or infraclavicular adenopathy no breast tenderness or nipple discharge. (exam performed in the presence of a chaperone)  LABORATORY DATA:  I have reviewed the data as listed    Latest Ref Rng & Units 07/15/2022    9:41 AM 02/24/2021   12:04 PM 11/29/2020    5:38 AM  CMP  Glucose 70 - 99 mg/dL 425  956  387   BUN 8 - 27 mg/dL 13  13  16    Creatinine 0.57 - 1.00 mg/dL 5.64  3.32  9.51   Sodium 134 - 144 mmol/L 139  141  137   Potassium 3.5 - 5.2 mmol/L 4.1  4.3  4.0   Chloride 96 - 106 mmol/L 101  102  101   CO2 20 - 29 mmol/L 23  21  28    Calcium 8.7 - 10.3 mg/dL 88.4  16.6  8.4     Lab Results  Component Value Date   WBC 6.6 11/29/2020   HGB 8.9 (L) 11/29/2020   HCT 27.8 (L) 11/29/2020   MCV 95.2 11/29/2020   PLT 190 11/29/2020   NEUTROABS 8.0 (H) 11/25/2020    ASSESSMENT & PLAN:  No problem-specific Assessment & Plan notes found for this encounter.    No orders of the defined types were placed in this encounter.  The patient has a good understanding of the overall plan. she agrees with it. she will call with any problems that may develop before the next visit here. Total time spent: 30 mins including face to face time and time spent for planning, charting and co-ordination of care   Sherlyn Lick, CMA 05/04/23    I Janan Ridge am acting as a Neurosurgeon for [employer]  ***

## 2023-05-05 ENCOUNTER — Inpatient Hospital Stay: Payer: PPO | Attending: Hematology and Oncology | Admitting: Hematology and Oncology

## 2023-05-05 VITALS — BP 142/87 | HR 67 | Temp 98.9°F | Resp 18 | Ht 64.5 in | Wt 173.0 lb

## 2023-05-05 DIAGNOSIS — C50511 Malignant neoplasm of lower-outer quadrant of right female breast: Secondary | ICD-10-CM | POA: Diagnosis not present

## 2023-05-05 DIAGNOSIS — I890 Lymphedema, not elsewhere classified: Secondary | ICD-10-CM | POA: Insufficient documentation

## 2023-05-05 DIAGNOSIS — Z79811 Long term (current) use of aromatase inhibitors: Secondary | ICD-10-CM | POA: Insufficient documentation

## 2023-05-05 DIAGNOSIS — Z170 Estrogen receptor positive status [ER+]: Secondary | ICD-10-CM | POA: Diagnosis not present

## 2023-05-05 DIAGNOSIS — Z780 Asymptomatic menopausal state: Secondary | ICD-10-CM | POA: Diagnosis not present

## 2023-05-05 DIAGNOSIS — Z923 Personal history of irradiation: Secondary | ICD-10-CM | POA: Diagnosis not present

## 2023-05-05 DIAGNOSIS — C50911 Malignant neoplasm of unspecified site of right female breast: Secondary | ICD-10-CM | POA: Diagnosis not present

## 2023-05-05 NOTE — Assessment & Plan Note (Signed)
02/15/2020:Screening mammogram detected right breast mass. 0.4cm mass at the 6:30 position and no axillary adenopathy. Biopsy: invasive ductal carcinoma, grade 3, HER-2 negative (1+), ER+ 95%, PR- 0%, KI67 40%. T1 a N0 stage Ia clinical stage   03/20/2020:Right lumpectomy Carolynne Edouard): invasive and in situ ductal carcinoma, 0.5cm, clear margins, 4 right axillary lymph nodes negative T1 a N0 stage Ia pathologic stage 04/22/2020-05/12/2020: Adjuvant radiation With radiation she felt slightly depressed to the point that she was crying.  She improved with the counseling and support of her husband.   Treatment plan: Adjuvant antiestrogen therapy with anastrozole 1 mg daily  06/02/2020- 10/22 (stopped because of muscle aches and pains) started 09/13/21 letrozole   Letrozole toxicities:    Improved muscle aches and pains.   Fracture of the left hip February 2022: Status post surgery.       Breast cancer surveillance: 1.  Breast exam 05/05/2023: Benign, right breast lymphedema 2. mammogram 03/22/2023: Benign breast density category B   Bone density 03/04/2022: T score -2.2:     Return to clinic in 1 year for follow-up

## 2023-05-25 DIAGNOSIS — B349 Viral infection, unspecified: Secondary | ICD-10-CM | POA: Diagnosis not present

## 2023-05-29 ENCOUNTER — Other Ambulatory Visit: Payer: Self-pay | Admitting: Hematology and Oncology

## 2023-05-30 ENCOUNTER — Other Ambulatory Visit: Payer: Self-pay

## 2023-05-30 ENCOUNTER — Encounter (HOSPITAL_COMMUNITY): Payer: Self-pay

## 2023-05-30 ENCOUNTER — Emergency Department (HOSPITAL_COMMUNITY): Payer: PPO

## 2023-05-30 ENCOUNTER — Emergency Department (HOSPITAL_COMMUNITY)
Admission: EM | Admit: 2023-05-30 | Discharge: 2023-05-30 | Disposition: A | Discharge status: Home / Self Care | Payer: PPO | Attending: Emergency Medicine | Admitting: Emergency Medicine

## 2023-05-30 DIAGNOSIS — E119 Type 2 diabetes mellitus without complications: Secondary | ICD-10-CM

## 2023-05-30 DIAGNOSIS — K319 Disease of stomach and duodenum, unspecified: Secondary | ICD-10-CM | POA: Insufficient documentation

## 2023-05-30 DIAGNOSIS — Z7982 Long term (current) use of aspirin: Secondary | ICD-10-CM | POA: Diagnosis not present

## 2023-05-30 DIAGNOSIS — I2510 Atherosclerotic heart disease of native coronary artery without angina pectoris: Secondary | ICD-10-CM

## 2023-05-30 DIAGNOSIS — Z853 Personal history of malignant neoplasm of breast: Secondary | ICD-10-CM | POA: Insufficient documentation

## 2023-05-30 DIAGNOSIS — R001 Bradycardia, unspecified: Secondary | ICD-10-CM | POA: Diagnosis not present

## 2023-05-30 DIAGNOSIS — R197 Diarrhea, unspecified: Secondary | ICD-10-CM

## 2023-05-30 DIAGNOSIS — Z79899 Other long term (current) drug therapy: Secondary | ICD-10-CM | POA: Insufficient documentation

## 2023-05-30 DIAGNOSIS — I10 Essential (primary) hypertension: Secondary | ICD-10-CM

## 2023-05-30 DIAGNOSIS — R109 Unspecified abdominal pain: Secondary | ICD-10-CM | POA: Diagnosis not present

## 2023-05-30 DIAGNOSIS — E782 Mixed hyperlipidemia: Secondary | ICD-10-CM

## 2023-05-30 DIAGNOSIS — I499 Cardiac arrhythmia, unspecified: Secondary | ICD-10-CM | POA: Insufficient documentation

## 2023-05-30 DIAGNOSIS — Z789 Other specified health status: Secondary | ICD-10-CM

## 2023-05-30 DIAGNOSIS — M1710 Unilateral primary osteoarthritis, unspecified knee: Secondary | ICD-10-CM | POA: Insufficient documentation

## 2023-05-30 DIAGNOSIS — E785 Hyperlipidemia, unspecified: Secondary | ICD-10-CM

## 2023-05-30 DIAGNOSIS — J309 Allergic rhinitis, unspecified: Secondary | ICD-10-CM | POA: Insufficient documentation

## 2023-05-30 DIAGNOSIS — E7800 Pure hypercholesterolemia, unspecified: Secondary | ICD-10-CM

## 2023-05-30 DIAGNOSIS — R739 Hyperglycemia, unspecified: Secondary | ICD-10-CM | POA: Insufficient documentation

## 2023-05-30 DIAGNOSIS — R112 Nausea with vomiting, unspecified: Secondary | ICD-10-CM | POA: Diagnosis not present

## 2023-05-30 LAB — CBC WITH DIFFERENTIAL/PLATELET
Abs Immature Granulocytes: 0 K/uL (ref 0.00–0.07)
Basophils Absolute: 0 K/uL (ref 0.0–0.1)
Basophils Relative: 0 %
Eosinophils Absolute: 0 K/uL (ref 0.0–0.5)
Eosinophils Relative: 1 %
HCT: 39.9 % (ref 36.0–46.0)
Hemoglobin: 13.1 g/dL (ref 12.0–15.0)
Immature Granulocytes: 0 %
Lymphocytes Relative: 23 %
Lymphs Abs: 1.2 K/uL (ref 0.7–4.0)
MCH: 30.7 pg (ref 26.0–34.0)
MCHC: 32.8 g/dL (ref 30.0–36.0)
MCV: 93.4 fL (ref 80.0–100.0)
Monocytes Absolute: 0.6 K/uL (ref 0.1–1.0)
Monocytes Relative: 12 %
Neutro Abs: 3.2 K/uL (ref 1.7–7.7)
Neutrophils Relative %: 64 %
Platelets: 291 K/uL (ref 150–400)
RBC: 4.27 MIL/uL (ref 3.87–5.11)
RDW: 12.6 % (ref 11.5–15.5)
WBC: 4.9 K/uL (ref 4.0–10.5)
nRBC: 0 % (ref 0.0–0.2)

## 2023-05-30 LAB — URINALYSIS, ROUTINE W REFLEX MICROSCOPIC
Bilirubin Urine: NEGATIVE
Glucose, UA: >=500 mg/dL — AB
Hgb urine dipstick: NEGATIVE
Ketones, ur: NEGATIVE mg/dL
Leukocytes,Ua: NEGATIVE
Nitrite: NEGATIVE
Protein, ur: NEGATIVE mg/dL
Specific Gravity, Urine: 1.021 (ref 1.005–1.030)
pH: 6 (ref 5.0–8.0)

## 2023-05-30 LAB — COMPREHENSIVE METABOLIC PANEL WITH GFR
ALT: 19 U/L (ref 0–44)
AST: 21 U/L (ref 15–41)
Albumin: 3.9 g/dL (ref 3.5–5.0)
Alkaline Phosphatase: 60 U/L (ref 38–126)
Anion gap: 9 (ref 5–15)
BUN: 13 mg/dL (ref 8–23)
CO2: 27 mmol/L (ref 22–32)
Calcium: 9.6 mg/dL (ref 8.9–10.3)
Chloride: 104 mmol/L (ref 98–111)
Creatinine, Ser: 0.71 mg/dL (ref 0.44–1.00)
GFR, Estimated: >60 mL/min
Glucose, Bld: 114 mg/dL — ABNORMAL HIGH (ref 70–99)
Potassium: 3.7 mmol/L (ref 3.5–5.1)
Sodium: 140 mmol/L (ref 135–145)
Total Bilirubin: 0.4 mg/dL (ref 0.3–1.2)
Total Protein: 7.4 g/dL (ref 6.5–8.1)

## 2023-05-30 LAB — LIPASE, BLOOD: Lipase: 39 U/L (ref 11–51)

## 2023-05-30 MED ORDER — EZETIMIBE 10 MG PO TABS
10.0000 mg | ORAL_TABLET | Freq: Every day | ORAL | 3 refills | Status: AC
Start: 2023-05-30 — End: ?

## 2023-05-30 MED ORDER — LOPERAMIDE HCL 2 MG PO CAPS: 2.0000 mg | ORAL_CAPSULE | Freq: Four times a day (QID) | ORAL | 0 refills | Status: DC | PRN

## 2023-05-30 MED ORDER — SODIUM CHLORIDE 0.9 % IV SOLN: INTRAVENOUS | Status: DC

## 2023-05-30 MED ORDER — SODIUM CHLORIDE 0.9 % IV BOLUS
1000.0000 mL | Freq: Once | INTRAVENOUS | Status: AC
  Administered 2023-05-30: 1000 mL via INTRAVENOUS

## 2023-05-30 MED ORDER — SODIUM CHLORIDE (PF) 0.9 % IJ SOLN
INTRAMUSCULAR | Status: AC
  Filled 2023-05-30: qty 50

## 2023-05-30 MED ORDER — IOHEXOL 300 MG/ML  SOLN
100.0000 mL | Freq: Once | INTRAMUSCULAR | Status: AC | PRN
  Administered 2023-05-30: 100 mL via INTRAVENOUS

## 2023-05-30 NOTE — ED Provider Notes (Signed)
[place] EMERGENCY DEPARTMENT AT [facility] Provider Note   CSN: [id number] Arrival date & time: 05/30/23  4098     History  Chief Complaint  Patient presents with   Diarrhea    Andrea Farmer is a 82 y.o. female.  HPI Elderly female with multiple medical problems including breast cancer presents with ongoing diarrhea.  Onset was about 1 week ago, since that time she has had multiple loose stools, minimal abdominal discomfort, no vomiting.  She went to the walk-in clinic at her primary care office, started on Zofran, notes that her symptoms have not change in spite of that. No fever, chest pain, dyspnea. She arrives via EMS.  EMS reports no hemodynamic instability en route.    Home Medications Prior to Admission medications   Medication Sig Start Date End Date Taking? Authorizing Provider  loperamide (IMODIUM) 2 MG capsule Take 1 capsule (2 mg total) by mouth 4 (four) times daily as needed for diarrhea or loose stools. 05/30/23  Yes Gerhard Munch, MD  acetaminophen (TYLENOL) 500 MG tablet Take 1,000 mg by mouth every 6 (six) hours as needed for moderate pain.    [provider]  amLODipine-benazepril (LOTREL) 5-40 MG capsule Take 1 capsule by mouth daily. 07/15/22   Meriam Sprague, MD  AREXVY 120 MCG/0.5ML injection  12/07/22   [provider]  aspirin EC 81 MG tablet Take 1 tablet (81 mg total) by mouth every other day. Swallow whole. 02/17/21   Meriam Sprague, MD  Calcium Carbonate-Vitamin D 600-400 MG-UNIT tablet Take 1 tablet by mouth 3 (three) times a week.     [provider]  Coenzyme Q10 (CO Q 10 PO) Take 200 mg by mouth every other day.     [provider]  dapagliflozin propanediol (FARXIGA) 10 MG TABS tablet Take 1 tablet (10 mg total) by mouth daily before breakfast. 12/31/22   Meriam Sprague, MD  dapagliflozin propanediol (FARXIGA) 10 MG TABS tablet Take 1 tablet (10 mg total) by mouth daily before  breakfast. 12/31/22   Meriam Sprague, MD  dapagliflozin propanediol (FARXIGA) 10 MG TABS tablet Take 1 tablet (10 mg total) by mouth daily before breakfast. 12/31/22   Meriam Sprague, MD  ezetimibe (ZETIA) 10 MG tablet Take 1 tablet (10 mg total) by mouth daily. 05/30/23   Meriam Sprague, MD  hydrochlorothiazide (HYDRODIURIL) 25 MG tablet Take 1 tablet (25 mg total) by mouth daily. Patient taking differently: Take 25 mg by mouth daily. Take 1/2 daily 07/15/22   Meriam Sprague, MD  Hyprom-Naphaz-Polysorb-Zn Sulf (CLEAR EYES COMPLETE OP) Place 1 drop into both eyes daily as needed (dry eyes).    [provider]  letrozole (FEMARA) 2.5 MG tablet TAKE 1 TABLET BY MOUTH EVERY DAY 05/30/23   Serena Croissant, MD  metFORMIN (GLUCOPHAGE-XR) 500 MG 24 hr tablet metformin ER 500 mg tablet,extended release 24 hr    [provider]  metoprolol succinate (TOPROL-XL) 25 MG 24 hr tablet Take 25 mg by mouth daily.    [provider]  mometasone (NASONEX) 50 MCG/ACT nasal spray Place 2 sprays into the nose 3 (three) times a week.    [provider]  Multiple Vitamin (MULTIVITAMIN WITH MINERALS) TABS tablet Take 1 tablet by mouth 3 (three) times a week.     [provider]  ondansetron (ZOFRAN-ODT) 4 MG disintegrating tablet Take 4 mg by mouth 3 (three) times daily. 05/25/23   [provider]  Letta Pate  ULTRA test strip daily. for testing blood sugar 11/30/21   [provider]  OneTouch UltraSoft 2 Lancets MISC daily. use as directed 01/27/23   [provider]  polyethylene glycol (MIRALAX / GLYCOLAX) 17 g packet Take 17 g by mouth daily. 08/30/18   [provider]  potassium chloride SA (KLOR-CON M) 20 MEQ tablet Take 1 tablet (20 mEq total) by mouth daily. 07/15/22   Meriam Sprague, MD  Probiotic Product (ALIGN) 4 MG CAPS Take 4 mg by mouth daily.    [provider]  rosuvastatin (CRESTOR) 20 MG tablet Take 1  tablet (20 mg total) by mouth daily. 07/15/22   Meriam Sprague, MD  TURMERIC PO Take 500 mg by mouth every other day.    [provider]  vitamin E 400 UNIT capsule Take 400 Units by mouth every other day.    [provider]      Allergies    Tramadol    Review of Systems   Review of Systems  All other systems reviewed and are negative.   Physical Exam Updated Vital Signs BP 102/70   Pulse (!) 59   Temp 98.4 F (36.9 C)   Resp 13   Ht 5\' 4"  (1.626 m)   Wt 78.5 kg   SpO2 95%   BMI 29.71 kg/m  Physical Exam Vitals and nursing note reviewed.  Constitutional:      General: She is not in acute distress.    Appearance: She is well-developed.  HENT:     Head: Normocephalic and atraumatic.  Eyes:     Conjunctiva/sclera: Conjunctivae normal.  Cardiovascular:     Rate and Rhythm: Normal rate and regular rhythm.  Pulmonary:     Effort: Pulmonary effort is normal. No respiratory distress.     Breath sounds: Normal breath sounds. No stridor.  Abdominal:     General: There is no distension.     Tenderness: There is no abdominal tenderness. There is no guarding.  Skin:    General: Skin is warm and dry.  Neurological:     Mental Status: She is alert and oriented to person, place, and time.     Cranial Nerves: No cranial nerve deficit.  Psychiatric:        Mood and Affect: Mood normal.     ED Results / Procedures / Treatments   Labs (all labs ordered are listed, but only abnormal results are displayed) Labs Reviewed  COMPREHENSIVE METABOLIC PANEL - Abnormal; Notable for the following components:      Result Value   Glucose, Bld 114 (*)    All other components within normal limits  URINALYSIS, ROUTINE W REFLEX MICROSCOPIC - Abnormal; Notable for the following components:   Color, Urine STRAW (*)    Glucose, UA >=500 (*)    Bacteria, UA RARE (*)    All other components within normal limits  LIPASE, BLOOD  CBC WITH DIFFERENTIAL/PLATELET     EKG None  Radiology CT ABDOMEN PELVIS W CONTRAST  Result Date: 05/30/2023 CLINICAL DATA:  Abdominal pain and diarrhea for 1 week. Irritable bowel syndrome. EXAM: CT ABDOMEN AND PELVIS WITH CONTRAST TECHNIQUE: Multidetector CT imaging of the abdomen and pelvis was performed using the standard protocol following bolus administration of intravenous contrast. RADIATION DOSE REDUCTION: This exam was performed according to the departmental dose-optimization program which includes automated exposure control, adjustment of the mA and/or kV according to patient size and/or use of iterative reconstruction technique. CONTRAST:  OMNIPAQUE IOHEXOL  300 MG/ML  SOLN COMPARISON:  04/26/2013 FINDINGS: Lower Chest: No acute findings. Hepatobiliary: No suspicious hepatic masses identified. Gallbladder is unremarkable. No evidence of biliary ductal dilatation. Pancreas:  No mass or inflammatory changes. Spleen: Within normal limits in size and appearance. Adrenals/Urinary Tract: No suspicious masses identified. No evidence of ureteral calculi or hydronephrosis. Unremarkable unopacified urinary bladder. Stomach/Bowel: No evidence of obstruction, inflammatory process or abnormal fluid collections. Vascular/Lymphatic: No pathologically enlarged lymph nodes. No acute vascular findings. Reproductive: Prior hysterectomy noted. Adnexal regions are unremarkable in appearance. Other:  None. Musculoskeletal: No suspicious bone lesions identified. Internal fixation hardware noted in left hip. IMPRESSION: No acute findings or other significant abnormality. Electronically Signed   By: Danae Orleans M.D.   On: 05/30/2023 14:49    Procedures Procedures    Medications Ordered in ED Medications  sodium chloride 0.9 % bolus 1,000 mL (1,000 mLs Intravenous New Bag/Given 05/30/23 1041)    And  0.9 %  sodium chloride infusion ( Intravenous New Bag/Given 05/30/23 1053)  iohexol (OMNIPAQUE) 300 MG/ML solution 100 mL (100 mLs  Intravenous Contrast Given 05/30/23 1135)    ED Course/ Medical Decision Making/ A&P                                 Medical Decision Making Elderly female presents with ongoing loose stool for about 1 week.  Chart review notable for history of IBS, some suspicion for flare given the patient's otherwise reassuring vital signs, denial of other complaints.  Given persistent loose stool suspicion for electrolyte abnormalities as well. Patient placed on monitors, labs CT ordered. Cardiac 80 sinus normal Pulse ox 99% room air normal   Amount and/or Complexity of Data Reviewed Independent Historian: EMS External Data Reviewed: notes. Labs: ordered. Decision-making details documented in ED Course. Radiology: ordered and independent interpretation performed. Decision-making details documented in ED Course.  Risk Prescription drug management. Decision regarding hospitalization.   3:05 PM After a delay and obtaining radiology CT interpretation, patient is reassessed.  She is awake, alert, hemodynamically unremarkable, CT reviewed, labs reviewed, all unremarkable, no evidence for bacteremia, sepsis, abscess.  She and I discussed diarrhea, appropriate medications for this, follow-up, return precautions and she was discharged in stable condition.        Final Clinical Impression(s) / ED Diagnoses Final diagnoses:  Diarrhea, unspecified type    Rx / DC Orders ED Discharge Orders          Ordered    loperamide (IMODIUM) 2 MG capsule  4 times daily PRN        05/30/23 1504              Gerhard Munch, MD 05/30/23 1505

## 2023-05-30 NOTE — Telephone Encounter (Signed)
Per last OV start letrozole to replace anastrazole. Lorayne Marek, RN

## 2023-05-30 NOTE — Discharge Instructions (Addendum)
Please stay well-hydrated and monitor your condition carefully.  Do not hesitate to return here for concerning changes in your condition.  For hydration consider Pedialyte or Gatorade. Please do not use additional Pepto-Bismol or Kaopectate.

## 2023-05-30 NOTE — ED Triage Notes (Signed)
BIBA pt reports upset stomach and diarrhea x7 days. States saw her pcp a few days ago, gave her meds and still not completed stop. Reports now darker in color. No other symptoms reported

## 2023-05-31 ENCOUNTER — Other Ambulatory Visit: Payer: Self-pay

## 2023-05-31 DIAGNOSIS — I2510 Atherosclerotic heart disease of native coronary artery without angina pectoris: Secondary | ICD-10-CM

## 2023-05-31 DIAGNOSIS — Z789 Other specified health status: Secondary | ICD-10-CM

## 2023-05-31 DIAGNOSIS — E119 Type 2 diabetes mellitus without complications: Secondary | ICD-10-CM

## 2023-05-31 DIAGNOSIS — E7800 Pure hypercholesterolemia, unspecified: Secondary | ICD-10-CM

## 2023-05-31 DIAGNOSIS — I10 Essential (primary) hypertension: Secondary | ICD-10-CM

## 2023-05-31 DIAGNOSIS — Z79899 Other long term (current) drug therapy: Secondary | ICD-10-CM

## 2023-05-31 DIAGNOSIS — E785 Hyperlipidemia, unspecified: Secondary | ICD-10-CM

## 2023-05-31 DIAGNOSIS — E782 Mixed hyperlipidemia: Secondary | ICD-10-CM

## 2023-05-31 MED ORDER — EZETIMIBE 10 MG PO TABS
10.0000 mg | ORAL_TABLET | Freq: Every day | ORAL | 2 refills | Status: AC
Start: 2023-05-31 — End: ?

## 2023-06-29 DIAGNOSIS — C50911 Malignant neoplasm of unspecified site of right female breast: Secondary | ICD-10-CM | POA: Diagnosis not present

## 2023-07-05 ENCOUNTER — Ambulatory Visit (HOSPITAL_BASED_OUTPATIENT_CLINIC_OR_DEPARTMENT_OTHER): Payer: PPO | Admitting: Cardiology

## 2023-07-05 ENCOUNTER — Encounter (HOSPITAL_BASED_OUTPATIENT_CLINIC_OR_DEPARTMENT_OTHER): Payer: Self-pay | Admitting: Cardiology

## 2023-07-05 VITALS — BP 110/78 | HR 65 | Ht 64.0 in | Wt 172.0 lb

## 2023-07-05 DIAGNOSIS — I77819 Aortic ectasia, unspecified site: Secondary | ICD-10-CM | POA: Diagnosis not present

## 2023-07-05 DIAGNOSIS — E1159 Type 2 diabetes mellitus with other circulatory complications: Secondary | ICD-10-CM

## 2023-07-05 DIAGNOSIS — I493 Ventricular premature depolarization: Secondary | ICD-10-CM | POA: Diagnosis not present

## 2023-07-05 DIAGNOSIS — I4729 Other ventricular tachycardia: Secondary | ICD-10-CM | POA: Diagnosis not present

## 2023-07-05 DIAGNOSIS — I2510 Atherosclerotic heart disease of native coronary artery without angina pectoris: Secondary | ICD-10-CM

## 2023-07-05 DIAGNOSIS — I10 Essential (primary) hypertension: Secondary | ICD-10-CM

## 2023-07-05 NOTE — Progress Notes (Signed)
Cardiology Office Note:  .   Date:  07/05/2023  ID:  Andrea Farmer, DOB 21-Jun-1941, MRN [id number] PCP: Daisy Floro, MD  [facility] Providers Cardiologist:  Meriam Sprague, MD {  History of Present Illness: .   Andrea Farmer is a 82 y.o. 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 She was previously a patient of Dr. Delton See and then Dr. Shari Prows and established care with me on 07/05/23.  Pertinent CV history: Monitor 2021 with 11% PVC burden and short NSVT. Echo with EF 60-65%. Coronary CT in 2021 with Ca score 1125, CADRADS 3 with diffuse 25-49% stenoses but negative FFR. Mid LAD with 50-69% stenosis and negative FFR.  Today: Doing well overall. Tries to eat well. Has a stairstepper and a stationary bike at home to keep active. Only complaint is fatigue, which has been gradual and nonlimiting.  ROS: Denies chest pain, shortness of breath at rest or with normal exertion. No PND, orthopnea, LE edema or unexpected weight gain. No syncope or palpitations. ROS otherwise negative except as noted.   Studies Reviewed: Marland Kitchen    EKG:  EKG Interpretation Date/Time:  Tuesday July 05 2023 13:49:25 EDT Ventricular Rate:  65 PR Interval:  190 QRS Duration:  94 QT Interval:  440 QTC Calculation: 457 R Axis:   -31  Text Interpretation: Normal sinus rhythm Left axis deviation Confirmed by Jodelle Red [phone]) on 07/05/2023 1:56:33 PM    Physical Exam:   VS:  BP 110/78   Pulse 65   Ht 5\' 4"  (1.626 m)   Wt 172 lb (78 kg)   SpO2 94%   BMI 29.52 kg/m    Wt Readings from Last 3 Encounters:  07/05/23 172 lb (78 kg)  05/30/23 173 lb 1 oz (78.5 kg)  05/05/23 173 lb (78.5 kg)    GEN: Well nourished, well developed in no acute distress HEENT: Normal, moist mucous membranes NECK: No JVD CARDIAC: regular rhythm, normal S1  and S2, no rubs or gallops. 1/6 systolic murmur. VASCULAR: Radial and DP pulses 2+ bilaterally. No carotid bruits RESPIRATORY:  Clear to auscultation without rales, wheezing or rhonchi  ABDOMEN: Soft, non-tender, non-distended MUSCULOSKELETAL:  Ambulates independently SKIN: Warm and dry, no edema NEUROLOGIC:  Alert and oriented x 3. No focal neuro deficits noted. PSYCHIATRIC:  Normal affect    ASSESSMENT AND PLAN: .    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 Limit salt. -recommend moderate walking, 3-5 times/week for 30-50 minutes each session. Aim for at least 150 minutes.week. Goal should be pace of 3 miles/hours, or walking 1.5 miles in 30 minutes -recommend avoidance of tobacco products. Avoid excess alcohol.  Dispo: 6 mos  Signed, Jodelle Red, MD   [employer]  Cristal Deer, MD, PhD, [facility]  [facility]  [place]  [facility] at [facility] at [facility] [address], [address] [place], [place] [address] [phone]

## 2023-07-05 NOTE — Patient Instructions (Signed)
Medication Instructions:  Your physician recommends that you continue on your current medications as directed. Please refer to the Current Medication list given to you today.  *If you need a refill on your cardiac medications before your next appointment, please call your pharmacy*  Follow-Up: At [facility], you and your health needs are our priority.  As part of our continuing mission to provide you with exceptional heart care, we have created designated Provider Care Teams.  These Care Teams include your primary Cardiologist (physician) and Advanced Practice Providers (APPs -  Physician Assistants and Nurse Practitioners) who all work together to provide you with the care you need, when you need it.  We recommend signing up for the patient portal called "MyChart".  Sign up information is provided on this After Visit Summary.  MyChart is used to connect with patients for Virtual Visits (Telemedicine).  Patients are able to view lab/test results, encounter notes, upcoming appointments, etc.  Non-urgent messages can be sent to your provider as well.   To learn more about what you can do with MyChart, go to [email or web address].    Your next appointment:   6 months with Dr. Cristal Deer

## 2023-07-14 DIAGNOSIS — C50911 Malignant neoplasm of unspecified site of right female breast: Secondary | ICD-10-CM | POA: Diagnosis not present

## 2023-07-15 ENCOUNTER — Ambulatory Visit (HOSPITAL_BASED_OUTPATIENT_CLINIC_OR_DEPARTMENT_OTHER): Payer: PPO

## 2023-07-15 DIAGNOSIS — I340 Nonrheumatic mitral (valve) insufficiency: Secondary | ICD-10-CM | POA: Diagnosis not present

## 2023-07-15 DIAGNOSIS — I77819 Aortic ectasia, unspecified site: Secondary | ICD-10-CM | POA: Diagnosis not present

## 2023-07-15 DIAGNOSIS — Z860100 Personal history of colon polyps, unspecified: Secondary | ICD-10-CM | POA: Diagnosis not present

## 2023-07-15 DIAGNOSIS — R197 Diarrhea, unspecified: Secondary | ICD-10-CM | POA: Diagnosis not present

## 2023-07-15 LAB — ECHOCARDIOGRAM COMPLETE
AR max vel: 2.5 cm2
AV Area VTI: 2.34 cm2
AV Area mean vel: 2.2 cm2
AV Mean grad: 4 mm[Hg]
AV Peak grad: 7.2 mm[Hg]
Ao pk vel: 1.34 m/s
Area-P 1/2: 2.79 cm2
Radius: 0.55 cm
S' Lateral: 2.34 cm

## 2023-07-20 ENCOUNTER — Telehealth: Payer: Self-pay

## 2023-07-20 NOTE — Telephone Encounter (Signed)
Patient Advocate Encounter   Received notification that patient has been auto renewed to receive Farxiga through [facility] until 10/10/2024.   Please send new rx for 1 yr supply [place] to [facility].  Haze Rushing, BA, CPhT Pharmacy Patient Advocate Specialist Fax: [phone]

## 2023-07-28 ENCOUNTER — Other Ambulatory Visit: Payer: Self-pay

## 2023-07-28 DIAGNOSIS — Z79899 Other long term (current) drug therapy: Secondary | ICD-10-CM

## 2023-07-28 DIAGNOSIS — E7800 Pure hypercholesterolemia, unspecified: Secondary | ICD-10-CM

## 2023-07-28 DIAGNOSIS — E119 Type 2 diabetes mellitus without complications: Secondary | ICD-10-CM

## 2023-07-28 DIAGNOSIS — E785 Hyperlipidemia, unspecified: Secondary | ICD-10-CM

## 2023-07-28 DIAGNOSIS — I2510 Atherosclerotic heart disease of native coronary artery without angina pectoris: Secondary | ICD-10-CM

## 2023-07-28 DIAGNOSIS — Z789 Other specified health status: Secondary | ICD-10-CM

## 2023-07-28 DIAGNOSIS — I10 Essential (primary) hypertension: Secondary | ICD-10-CM

## 2023-07-28 DIAGNOSIS — E782 Mixed hyperlipidemia: Secondary | ICD-10-CM

## 2023-07-28 MED ORDER — POTASSIUM CHLORIDE CRYS ER 20 MEQ PO TBCR
20.0000 meq | EXTENDED_RELEASE_TABLET | Freq: Every day | ORAL | 3 refills | Status: AC
Start: 2023-07-28 — End: ?

## 2023-08-02 ENCOUNTER — Other Ambulatory Visit (HOSPITAL_COMMUNITY): Payer: PPO

## 2023-08-04 DIAGNOSIS — Z9189 Other specified personal risk factors, not elsewhere classified: Secondary | ICD-10-CM | POA: Diagnosis not present

## 2023-08-04 DIAGNOSIS — Z853 Personal history of malignant neoplasm of breast: Secondary | ICD-10-CM | POA: Diagnosis not present

## 2023-08-12 DIAGNOSIS — E1169 Type 2 diabetes mellitus with other specified complication: Secondary | ICD-10-CM | POA: Diagnosis not present

## 2023-08-12 DIAGNOSIS — I10 Essential (primary) hypertension: Secondary | ICD-10-CM | POA: Diagnosis not present

## 2023-08-12 DIAGNOSIS — E7800 Pure hypercholesterolemia, unspecified: Secondary | ICD-10-CM | POA: Diagnosis not present

## 2023-08-15 DIAGNOSIS — D225 Melanocytic nevi of trunk: Secondary | ICD-10-CM | POA: Diagnosis not present

## 2023-08-15 DIAGNOSIS — L817 Pigmented purpuric dermatosis: Secondary | ICD-10-CM | POA: Diagnosis not present

## 2023-08-15 DIAGNOSIS — D1801 Hemangioma of skin and subcutaneous tissue: Secondary | ICD-10-CM | POA: Diagnosis not present

## 2023-08-15 DIAGNOSIS — D2272 Melanocytic nevi of left lower limb, including hip: Secondary | ICD-10-CM | POA: Diagnosis not present

## 2023-08-15 DIAGNOSIS — L814 Other melanin hyperpigmentation: Secondary | ICD-10-CM | POA: Diagnosis not present

## 2023-08-15 DIAGNOSIS — L821 Other seborrheic keratosis: Secondary | ICD-10-CM | POA: Diagnosis not present

## 2023-08-15 DIAGNOSIS — L720 Epidermal cyst: Secondary | ICD-10-CM | POA: Diagnosis not present

## 2023-08-17 ENCOUNTER — Telehealth: Payer: Self-pay | Admitting: Cardiology

## 2023-08-17 DIAGNOSIS — E119 Type 2 diabetes mellitus without complications: Secondary | ICD-10-CM | POA: Diagnosis not present

## 2023-08-17 DIAGNOSIS — M1990 Unspecified osteoarthritis, unspecified site: Secondary | ICD-10-CM | POA: Diagnosis not present

## 2023-08-17 DIAGNOSIS — Z6831 Body mass index (BMI) 31.0-31.9, adult: Secondary | ICD-10-CM | POA: Diagnosis not present

## 2023-08-17 DIAGNOSIS — I10 Essential (primary) hypertension: Secondary | ICD-10-CM | POA: Diagnosis not present

## 2023-08-17 DIAGNOSIS — I77819 Aortic ectasia, unspecified site: Secondary | ICD-10-CM | POA: Diagnosis not present

## 2023-08-17 DIAGNOSIS — E7800 Pure hypercholesterolemia, unspecified: Secondary | ICD-10-CM | POA: Diagnosis not present

## 2023-08-17 DIAGNOSIS — I700 Atherosclerosis of aorta: Secondary | ICD-10-CM | POA: Diagnosis not present

## 2023-08-17 DIAGNOSIS — Z0000 Encounter for general adult medical examination without abnormal findings: Secondary | ICD-10-CM | POA: Diagnosis not present

## 2023-08-17 NOTE — Telephone Encounter (Signed)
Returned call to patient and discussed echo results. She verbalized understanding and is thankful for call.

## 2023-08-17 NOTE — Telephone Encounter (Signed)
-----   Message from [facility] sent at 08/17/2023 10:56 AM EST ----- Echo is stable, no changes based on the results.

## 2023-08-17 NOTE — Telephone Encounter (Signed)
Patient returning call for echo results. 

## 2023-08-17 NOTE — Telephone Encounter (Signed)
Left message for patient to call back  

## 2023-08-17 NOTE — Telephone Encounter (Signed)
Returned call to patient and discussed Farxiga medication. She did receive medication and stated she will request refills or any further extensions from PCP since she sees Dr. Tenny Craw more frequently than Dr. Cristal Deer. Informed that if she has any other issues to please call the office. She verbalized understanding and was thankful for call.

## 2023-08-18 DIAGNOSIS — K648 Other hemorrhoids: Secondary | ICD-10-CM | POA: Diagnosis not present

## 2023-08-18 DIAGNOSIS — Z860101 Personal history of adenomatous and serrated colon polyps: Secondary | ICD-10-CM | POA: Diagnosis not present

## 2023-08-18 DIAGNOSIS — D123 Benign neoplasm of transverse colon: Secondary | ICD-10-CM | POA: Diagnosis not present

## 2023-08-18 DIAGNOSIS — Z09 Encounter for follow-up examination after completed treatment for conditions other than malignant neoplasm: Secondary | ICD-10-CM | POA: Diagnosis not present

## 2023-08-23 DIAGNOSIS — D123 Benign neoplasm of transverse colon: Secondary | ICD-10-CM | POA: Diagnosis not present

## 2023-09-07 DIAGNOSIS — R351 Nocturia: Secondary | ICD-10-CM | POA: Diagnosis not present

## 2023-09-07 DIAGNOSIS — R350 Frequency of micturition: Secondary | ICD-10-CM | POA: Diagnosis not present

## 2023-09-07 DIAGNOSIS — N3944 Nocturnal enuresis: Secondary | ICD-10-CM | POA: Diagnosis not present

## 2023-09-07 DIAGNOSIS — N3946 Mixed incontinence: Secondary | ICD-10-CM | POA: Diagnosis not present

## 2023-09-30 DIAGNOSIS — E119 Type 2 diabetes mellitus without complications: Secondary | ICD-10-CM | POA: Diagnosis not present

## 2023-09-30 DIAGNOSIS — Z961 Presence of intraocular lens: Secondary | ICD-10-CM | POA: Diagnosis not present

## 2023-09-30 DIAGNOSIS — H26493 Other secondary cataract, bilateral: Secondary | ICD-10-CM | POA: Diagnosis not present

## 2023-09-30 DIAGNOSIS — H52203 Unspecified astigmatism, bilateral: Secondary | ICD-10-CM | POA: Diagnosis not present

## 2023-10-23 ENCOUNTER — Other Ambulatory Visit: Payer: Self-pay | Admitting: Hematology and Oncology

## 2023-10-28 NOTE — Telephone Encounter (Signed)
Patient called and wanted to verify next appointment scheduled. Advise patient next appt date is 07/28 @145 .

## 2023-12-16 DIAGNOSIS — M25662 Stiffness of left knee, not elsewhere classified: Secondary | ICD-10-CM | POA: Diagnosis not present

## 2023-12-16 DIAGNOSIS — R2689 Other abnormalities of gait and mobility: Secondary | ICD-10-CM | POA: Diagnosis not present

## 2023-12-16 DIAGNOSIS — M25661 Stiffness of right knee, not elsewhere classified: Secondary | ICD-10-CM | POA: Diagnosis not present

## 2023-12-21 DIAGNOSIS — M25661 Stiffness of right knee, not elsewhere classified: Secondary | ICD-10-CM | POA: Diagnosis not present

## 2023-12-21 DIAGNOSIS — M25662 Stiffness of left knee, not elsewhere classified: Secondary | ICD-10-CM | POA: Diagnosis not present

## 2023-12-21 DIAGNOSIS — R2689 Other abnormalities of gait and mobility: Secondary | ICD-10-CM | POA: Diagnosis not present

## 2023-12-26 DIAGNOSIS — M25662 Stiffness of left knee, not elsewhere classified: Secondary | ICD-10-CM | POA: Diagnosis not present

## 2023-12-26 DIAGNOSIS — M25661 Stiffness of right knee, not elsewhere classified: Secondary | ICD-10-CM | POA: Diagnosis not present

## 2023-12-26 DIAGNOSIS — R2689 Other abnormalities of gait and mobility: Secondary | ICD-10-CM | POA: Diagnosis not present

## 2023-12-29 DIAGNOSIS — R2689 Other abnormalities of gait and mobility: Secondary | ICD-10-CM | POA: Diagnosis not present

## 2023-12-29 DIAGNOSIS — M25662 Stiffness of left knee, not elsewhere classified: Secondary | ICD-10-CM | POA: Diagnosis not present

## 2023-12-29 DIAGNOSIS — M25661 Stiffness of right knee, not elsewhere classified: Secondary | ICD-10-CM | POA: Diagnosis not present

## 2024-01-04 ENCOUNTER — Ambulatory Visit (HOSPITAL_BASED_OUTPATIENT_CLINIC_OR_DEPARTMENT_OTHER): Payer: PPO | Admitting: Cardiology

## 2024-01-04 ENCOUNTER — Encounter (HOSPITAL_BASED_OUTPATIENT_CLINIC_OR_DEPARTMENT_OTHER): Payer: Self-pay | Admitting: Cardiology

## 2024-01-04 VITALS — BP 132/88 | HR 66 | Ht 64.0 in | Wt 174.9 lb

## 2024-01-04 DIAGNOSIS — I341 Nonrheumatic mitral (valve) prolapse: Secondary | ICD-10-CM | POA: Insufficient documentation

## 2024-01-04 DIAGNOSIS — E1159 Type 2 diabetes mellitus with other circulatory complications: Secondary | ICD-10-CM | POA: Diagnosis not present

## 2024-01-04 DIAGNOSIS — I2510 Atherosclerotic heart disease of native coronary artery without angina pectoris: Secondary | ICD-10-CM | POA: Diagnosis not present

## 2024-01-04 DIAGNOSIS — E7800 Pure hypercholesterolemia, unspecified: Secondary | ICD-10-CM

## 2024-01-04 DIAGNOSIS — I493 Ventricular premature depolarization: Secondary | ICD-10-CM | POA: Diagnosis not present

## 2024-01-04 DIAGNOSIS — I10 Essential (primary) hypertension: Secondary | ICD-10-CM

## 2024-01-04 DIAGNOSIS — R002 Palpitations: Secondary | ICD-10-CM | POA: Diagnosis not present

## 2024-01-04 NOTE — Progress Notes (Signed)
  Cardiology Office Note:  .   Date:  01/04/2024  ID:  YUKA LALLIER, DOB Jan 23, 1941, MRN [id number] PCP: Daisy Floro, MD  [facility] Providers Cardiologist:  Jodelle Red, MD {  History of Present Illness: .   Andrea Farmer is a 83 y.o. 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 She was previously a patient of Dr. Delton See and then Dr. Shari Prows and established care with me on 07/05/23.  Pertinent CV history: Monitor 2021 with 11% PVC burden and short NSVT. Echo with EF 60-65%. Coronary CT in 2021 with Ca score 1125, CADRADS 3 with diffuse 25-49% stenoses but negative FFR. Mid LAD with 50-69% stenosis and negative FFR.  Today: Overall doing well. Able to take care of house, runs all errands. Has arthritis, uses cane, but no other limitations. Tolerating medications well. Very rare palpitations, nonlimiting. LE edema well managed with intermittent hydrochlorothiazide.  ROS: Denies chest pain, shortness of breath at rest or with normal exertion. No PND, orthopnea, LE edema or unexpected weight gain. No syncope. ROS otherwise negative except as noted.   Studies Reviewed: Marland Kitchen    EKG:       Physical Exam:   VS:  BP 132/88   Pulse 66   Ht 5\' 4"  (1.626 m)   Wt 174 lb 14.4 oz (79.3 kg)   SpO2 98%   BMI 30.02 kg/m    Wt Readings from Last 3 Encounters:  01/04/24 174 lb 14.4 oz (79.3 kg)  07/05/23 172 lb (78 kg)  05/30/23 173 lb 1 oz (78.5 kg)    GEN: Well nourished, well developed in no acute distress HEENT: Normal, moist mucous membranes NECK: No JVD CARDIAC: regular rhythm, normal S1 and S2, no rubs or gallops. 1/6 systolic murmur. VASCULAR: Radial and DP pulses 2+ bilaterally. No carotid bruits RESPIRATORY:  Clear to auscultation without rales, wheezing or rhonchi  ABDOMEN: Soft, non-tender,  non-distended MUSCULOSKELETAL:  Ambulates independently SKIN: Warm and dry, no edema. Bilateral prominent ankle bursa NEUROLOGIC:  Alert and oriented x 3. No focal neuro deficits noted. PSYCHIATRIC:  Normal affect    ASSESSMENT AND PLAN: .    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

## 2024-01-04 NOTE — Patient Instructions (Signed)
 Medication Instructions:  Your physician recommends that you continue on your current medications as directed. Please refer to the Current Medication list given to you today.  *If you need a refill on your cardiac medications before your next appointment, please call your pharmacy*   Follow-Up: At [facility], you and your health needs are our priority.  As part of our continuing mission to provide you with exceptional heart care, we have created designated Provider Care Teams.  These Care Teams include your primary Cardiologist (physician) and Advanced Practice Providers (APPs -  Physician Assistants and Nurse Practitioners) who all work together to provide you with the care you need, when you need it.  We recommend signing up for the patient portal called "MyChart".  Sign up information is provided on this After Visit Summary.  MyChart is used to connect with patients for Virtual Visits (Telemedicine).  Patients are able to view lab/test results, encounter notes, upcoming appointments, etc.  Non-urgent messages can be sent to your provider as well.   To learn more about what you can do with MyChart, go to [email or web address].     Your next appointment:   6 month(s)  Provider:   Jodelle Red, MD, Eligha Bridegroom, NP or Gillian Shields, NP

## 2024-01-06 DIAGNOSIS — R2689 Other abnormalities of gait and mobility: Secondary | ICD-10-CM | POA: Diagnosis not present

## 2024-01-06 DIAGNOSIS — M25662 Stiffness of left knee, not elsewhere classified: Secondary | ICD-10-CM | POA: Diagnosis not present

## 2024-01-06 DIAGNOSIS — M25661 Stiffness of right knee, not elsewhere classified: Secondary | ICD-10-CM | POA: Diagnosis not present

## 2024-01-17 DIAGNOSIS — N3946 Mixed incontinence: Secondary | ICD-10-CM | POA: Diagnosis not present

## 2024-01-17 DIAGNOSIS — N3944 Nocturnal enuresis: Secondary | ICD-10-CM | POA: Diagnosis not present

## 2024-01-17 DIAGNOSIS — R350 Frequency of micturition: Secondary | ICD-10-CM | POA: Diagnosis not present

## 2024-01-19 DIAGNOSIS — G5621 Lesion of ulnar nerve, right upper limb: Secondary | ICD-10-CM | POA: Diagnosis not present

## 2024-01-26 DIAGNOSIS — G5621 Lesion of ulnar nerve, right upper limb: Secondary | ICD-10-CM | POA: Diagnosis not present

## 2024-01-26 DIAGNOSIS — M25521 Pain in right elbow: Secondary | ICD-10-CM | POA: Diagnosis not present

## 2024-02-14 ENCOUNTER — Telehealth: Payer: Self-pay

## 2024-02-14 DIAGNOSIS — E1169 Type 2 diabetes mellitus with other specified complication: Secondary | ICD-10-CM | POA: Diagnosis not present

## 2024-02-14 DIAGNOSIS — M179 Osteoarthritis of knee, unspecified: Secondary | ICD-10-CM | POA: Diagnosis not present

## 2024-02-14 DIAGNOSIS — L659 Nonscarring hair loss, unspecified: Secondary | ICD-10-CM | POA: Diagnosis not present

## 2024-02-14 DIAGNOSIS — Z6831 Body mass index (BMI) 31.0-31.9, adult: Secondary | ICD-10-CM | POA: Diagnosis not present

## 2024-02-14 DIAGNOSIS — R1013 Epigastric pain: Secondary | ICD-10-CM | POA: Diagnosis not present

## 2024-02-14 NOTE — Telephone Encounter (Signed)
   Pre-operative Risk Assessment    Patient Name: Andrea Farmer  DOB: Dec 07, 1940 MRN: [id number]   Date of last office visit: 01/04/24 Date of next office visit: 07/03/24   Request for Surgical Clearance    Procedure:   Right ulnar nerve release and anterior transposition with flexor pronator lengthening as necessary   Date of Surgery:  Clearance 02/21/24                                 Surgeon:  Dr. Ronn Cohn  Surgeon's Group or Practice Name:  [facility]  Phone number:  [phone] Fax number:  [phone]   Type of Clearance Requested:   - Medical  - Pharmacy:  Hold Aspirin  Not indicated    Type of Anesthesia:  Not Indicated   Additional requests/questions:    SignedZulema Hitchcock   02/14/2024, 3:21 PM

## 2024-02-14 NOTE — Telephone Encounter (Signed)
   Primary Cardiologist: Sheryle Donning, MD  Chart reviewed as part of pre-operative protocol coverage. Given past medical history and time since last visit, based on ACC/AHA guidelines, Andrea Farmer would be at acceptable risk for the planned procedure without further cardiovascular testing.   Patient was advised that if she develops new symptoms prior to surgery to contact our office to arrange a follow-up appointment. She verbalized understanding.  Per office protocol, she may hold aspirin  for 5-7 days prior to procedure and should resume as soon as hemodynamically stable postoperatively.  I will route this recommendation to the requesting party via Epic fax function and remove from pre-op pool.  Please call with questions.  Gerldine Koch, NP-C  02/14/2024, 3:37 PM [address] [place], [place] [address] Office [phone] Fax [phone]

## 2024-02-15 DIAGNOSIS — R350 Frequency of micturition: Secondary | ICD-10-CM | POA: Diagnosis not present

## 2024-02-15 DIAGNOSIS — N3946 Mixed incontinence: Secondary | ICD-10-CM | POA: Diagnosis not present

## 2024-02-21 DIAGNOSIS — G5621 Lesion of ulnar nerve, right upper limb: Secondary | ICD-10-CM | POA: Diagnosis not present

## 2024-02-22 ENCOUNTER — Telehealth (HOSPITAL_BASED_OUTPATIENT_CLINIC_OR_DEPARTMENT_OTHER): Payer: Self-pay | Admitting: *Deleted

## 2024-02-22 NOTE — Telephone Encounter (Signed)
 Called pt to go over significant others lab results per (DPR). Andrea Farmer is also pt of Dr. Veryl Gottron and wanted to go over upcoming appts. Pt inquired about bone density screening. Stated pt has to contact PCP to get test ordered. Pt also inquired about 6/11 test.  [employer] printed a reminder letter with time, date and place of mammogram. Placed in mail today with confirmed home address.

## 2024-03-07 DIAGNOSIS — M25621 Stiffness of right elbow, not elsewhere classified: Secondary | ICD-10-CM | POA: Diagnosis not present

## 2024-03-16 DIAGNOSIS — R140 Abdominal distension (gaseous): Secondary | ICD-10-CM | POA: Diagnosis not present

## 2024-03-16 DIAGNOSIS — Z860100 Personal history of colon polyps, unspecified: Secondary | ICD-10-CM | POA: Diagnosis not present

## 2024-03-20 DIAGNOSIS — M25621 Stiffness of right elbow, not elsewhere classified: Secondary | ICD-10-CM | POA: Diagnosis not present

## 2024-03-21 ENCOUNTER — Ambulatory Visit
Admission: RE | Admit: 2024-03-21 | Discharge: 2024-03-21 | Disposition: A | Discharge status: Home / Self Care | Payer: PPO | Source: Ambulatory Visit | Attending: Family Medicine | Admitting: Family Medicine

## 2024-03-21 DIAGNOSIS — Z1231 Encounter for screening mammogram for malignant neoplasm of breast: Secondary | ICD-10-CM

## 2024-03-22 DIAGNOSIS — N3946 Mixed incontinence: Secondary | ICD-10-CM | POA: Diagnosis not present

## 2024-03-22 DIAGNOSIS — R350 Frequency of micturition: Secondary | ICD-10-CM | POA: Diagnosis not present

## 2024-04-02 DIAGNOSIS — M25621 Stiffness of right elbow, not elsewhere classified: Secondary | ICD-10-CM | POA: Diagnosis not present

## 2024-04-08 DIAGNOSIS — J019 Acute sinusitis, unspecified: Secondary | ICD-10-CM | POA: Diagnosis not present

## 2024-05-07 ENCOUNTER — Ambulatory Visit: Payer: PPO | Admitting: Hematology and Oncology

## 2024-05-08 DIAGNOSIS — Z6829 Body mass index (BMI) 29.0-29.9, adult: Secondary | ICD-10-CM | POA: Diagnosis not present

## 2024-05-08 DIAGNOSIS — M48062 Spinal stenosis, lumbar region with neurogenic claudication: Secondary | ICD-10-CM | POA: Diagnosis not present

## 2024-05-10 DIAGNOSIS — R350 Frequency of micturition: Secondary | ICD-10-CM | POA: Diagnosis not present

## 2024-05-10 DIAGNOSIS — N3946 Mixed incontinence: Secondary | ICD-10-CM | POA: Diagnosis not present

## 2024-05-21 ENCOUNTER — Inpatient Hospital Stay: Payer: Self-pay | Attending: Hematology and Oncology | Admitting: Hematology and Oncology

## 2024-05-21 VITALS — BP 141/74 | HR 67 | Temp 98.0°F | Resp 14 | Wt 168.5 lb

## 2024-05-21 DIAGNOSIS — Z923 Personal history of irradiation: Secondary | ICD-10-CM | POA: Diagnosis not present

## 2024-05-21 DIAGNOSIS — Z170 Estrogen receptor positive status [ER+]: Secondary | ICD-10-CM | POA: Insufficient documentation

## 2024-05-21 DIAGNOSIS — Z79811 Long term (current) use of aromatase inhibitors: Secondary | ICD-10-CM | POA: Insufficient documentation

## 2024-05-21 DIAGNOSIS — Z780 Asymptomatic menopausal state: Secondary | ICD-10-CM | POA: Diagnosis not present

## 2024-05-21 DIAGNOSIS — C50511 Malignant neoplasm of lower-outer quadrant of right female breast: Secondary | ICD-10-CM | POA: Diagnosis not present

## 2024-05-21 MED ORDER — LETROZOLE 2.5 MG PO TABS: 2.5000 mg | ORAL_TABLET | Freq: Every day | ORAL | 3 refills | Status: AC

## 2024-05-21 NOTE — Progress Notes (Signed)
 Patient Care Team: Okey Carlin Redbird, MD as PCP - General (Family Medicine) Lonni Slain, MD as PCP - Cardiology (Cardiology) Odean Potts, MD as Consulting Physician (Hematology and Oncology) Curvin Deward MOULD, MD as Consulting Physician (General Surgery) Dewey Rush, MD as Consulting Physician (Radiation Oncology)  DIAGNOSIS:  Encounter Diagnoses  Name Primary?   Malignant neoplasm of lower-outer quadrant of right breast of female, estrogen receptor positive (HCC) Yes   Post-menopausal     SUMMARY OF ONCOLOGIC HISTORY: Oncology History  Malignant neoplasm of lower-outer quadrant of right breast of female, estrogen receptor positive (HCC)  02/15/2020 Initial Diagnosis   Screening mammogram detected right breast mass. 0.4cm mass at the 6:30 position and no axillary adenopathy. Biopsy: invasive ductal carcinoma, grade 3, HER-2 negative (1+), ER+ 95%, PR- 0%, KI67 40%.   03/20/2020 Surgery   Right lumpectomy [facility]er) [phone]): invasive and in situ ductal carcinoma, 0.5cm, clear margins, 4 right axillary lymph nodes negative    04/21/2020 - 05/16/2020 Radiation Therapy   The patient initially received a dose of 42.56 Gy in 16 fractions to the breast using whole-breast tangent fields. This was delivered using a 3-D conformal technique. The patient then received a boost to the seroma. This delivered an additional 8 Gy in 59fractions using a 3 field photon technique due to the depth of the seroma. The total dose was 50.56 Gy.   05/2020 - 05/2025 Anti-estrogen oral therapy   Anastrozole , switched to letrozole      CHIEF COMPLIANT: Follow-up on letrozole  therapy  HISTORY OF PRESENT ILLNESS: History of Present Illness Andrea Farmer is an 83 year old female with breast cancer who presents for a follow-up visit.  She has been on letrozole  for over four years and experiences vaginal discharge, more frequent at night, without bleeding. She has no hot flashes, breast pain, or  discomfort. There was a recent issue with scheduling a bone density test due to changes at the breast center, and she plans to have this test done at [facility].     ALLERGIES:  is allergic to tramadol .  MEDICATIONS:  Current Outpatient Medications  Medication Sig Dispense Refill   acetaminophen  (TYLENOL ) 500 MG tablet Take 1,000 mg by mouth every 6 (six) hours as needed for moderate pain.     amLODipine -benazepril  (LOTREL) 5-40 MG capsule Take 1 capsule by mouth daily. 90 capsule 2   aspirin  EC 81 MG tablet Take 1 tablet (81 mg total) by mouth every other day. Swallow whole. 90 tablet 3   Coenzyme Q10 (CO Q 10 PO) Take 200 mg by mouth every other day.      metoprolol  succinate (TOPROL -XL) 25 MG 24 hr tablet Take 25 mg by mouth daily.     Multiple Vitamin (MULTIVITAMIN WITH MINERALS) TABS tablet Take 1 tablet by mouth 3 (three) times a week.      ONETOUCH ULTRA test strip daily. for testing blood sugar     OneTouch UltraSoft 2 Lancets MISC daily. use as directed     polyethylene glycol (MIRALAX  / GLYCOLAX ) 17 g packet Take 17 g by mouth daily.     potassium chloride  SA (KLOR-CON  M) 20 MEQ tablet Take 1 tablet (20 mEq total) by mouth daily. 90 tablet 3   Probiotic Product (ALIGN) 4 MG CAPS Take 4 mg by mouth daily.     rosuvastatin  (CRESTOR ) 20 MG tablet Take 1 tablet (20 mg total) by mouth daily. 90 tablet 3   Calcium  Carbonate-Vitamin D  600-400 MG-UNIT tablet Take 1 tablet by  mouth 3 (three) times a week.  (Patient not taking: Reported on 05/21/2024)     dapagliflozin  propanediol (FARXIGA ) 10 MG TABS tablet Take 1 tablet (10 mg total) by mouth daily before breakfast. 30 tablet 6   ezetimibe  (ZETIA ) 10 MG tablet Take 1 tablet (10 mg total) by mouth daily. 90 tablet 2   hydrochlorothiazide  (HYDRODIURIL ) 25 MG tablet Take 1 tablet (25 mg total) by mouth daily. (Patient not taking: Reported on 05/21/2024) 90 tablet 2   letrozole  (FEMARA ) 2.5 MG tablet Take 1 tablet (2.5 mg total) by mouth daily.  90 tablet 3   metFORMIN  (GLUCOPHAGE -XR) 500 MG 24 hr tablet metformin  ER 500 mg tablet,extended release 24 hr (Patient not taking: Reported on 05/21/2024)     TURMERIC PO Take 500 mg by mouth every other day. (Patient not taking: Reported on 05/21/2024)     vitamin E  400 UNIT capsule Take 400 Units by mouth every other day. (Patient not taking: Reported on 05/21/2024)     No current facility-administered medications for this visit.    PHYSICAL EXAMINATION: ECOG PERFORMANCE STATUS: 1 - Symptomatic but completely ambulatory  Vitals:   05/21/24 1400  BP: (!) 141/74  Pulse: 67  Resp: 14  Temp: 98 F (36.7 C)  SpO2: 99%   Filed Weights   05/21/24 1400  Weight: 168 lb 8 oz (76.4 kg)    Physical Exam No lumps or nodules in bilateral breasts or axilla  (exam performed in the presence of a chaperone)  LABORATORY DATA:  I have reviewed the data as listed    Latest Ref Rng & Units 05/30/2023   10:15 AM 07/15/2022    9:41 AM 02/24/2021   12:04 PM  CMP  Glucose 70 - 99 mg/dL 885  886  891   BUN 8 - 23 mg/dL 13  13  13    Creatinine 0.44 - 1.00 mg/dL 9.28  9.38  9.39   Sodium 135 - 145 mmol/L 140  139  141   Potassium 3.5 - 5.1 mmol/L 3.7  4.1  4.3   Chloride 98 - 111 mmol/L 104  101  102   CO2 22 - 32 mmol/L 27  23  21    Calcium  8.9 - 10.3 mg/dL 9.6  89.4  89.2   Total Protein 6.5 - 8.1 g/dL 7.4     Total Bilirubin 0.3 - 1.2 mg/dL 0.4     Alkaline Phos 38 - 126 U/L 60     AST 15 - 41 U/L 21     ALT 0 - 44 U/L 19       Lab Results  Component Value Date   WBC 4.9 05/30/2023   HGB 13.1 05/30/2023   HCT 39.9 05/30/2023   MCV 93.4 05/30/2023   PLT 291 05/30/2023   NEUTROABS 3.2 05/30/2023    ASSESSMENT & PLAN:  Malignant neoplasm of lower-outer quadrant of right breast of female, estrogen receptor positive (HCC) 02/15/2020:Screening mammogram detected right breast mass. 0.4cm mass at the 6:30 position and no axillary adenopathy. Biopsy: invasive ductal carcinoma, grade 3, HER-2  negative (1+), ER+ 95%, PR- 0%, KI67 40%. T1 a N0 stage Ia clinical stage   03/20/2020:Right lumpectomy [facility]): invasive and in situ ductal carcinoma, 0.5cm, clear margins, 4 right axillary lymph nodes negative T1 a N0 stage Ia pathologic stage 04/22/2020-05/12/2020: Adjuvant radiation With radiation she felt slightly depressed to the point that she was crying.  She improved with the counseling and support of her husband.   Treatment plan:  Adjuvant antiestrogen therapy with anastrozole  1 mg daily  06/02/2020- 10/22 (stopped because of muscle aches and pains) started 09/13/21 letrozole  We discussed the treatment duration with letrozole .  Her plan is to discontinue treatment next year and then follow up with Morna for her annual checkups   Letrozole  toxicities:    Improved muscle aches and pains.   Fracture of the left hip February 2022: Status post surgery.   We will obtain another bone density test this year.    Breast cancer surveillance: 1.  Breast exam 05/21/2024: Benign, right breast lymphedema 2. mammogram 03/23/2024: Benign breast density category B   Bone density 03/04/2022: T score -2.2: Repeating bone density test in May 2025.   Return to clinic in 1 year for follow-up and then follow-up with Morna thereafter in long-term survivorship  Assessment & Plan Estrogen receptor positive malignant neoplasm of lower-outer quadrant of right breast, status post-treatment, on adjuvant letrozole  On adjuvant letrozole  with intermittent vaginal discharge, a known side effect. No vaginal bleeding or breast pain. Compliant with mammograms. Letrozole  to continue for one more year. - Order bone density scan at [facility] within 2-3 months. - Refill letrozole  prescription at [facility] on [address]. - Discontinue letrozole  in one year. - Discuss follow-up care options with primary care or nurse practitioner next year.      Orders Placed This Encounter  Procedures   DG Bone Density    Standing  Status:   Future    Expected Date:   08/21/2024    Expiration Date:   05/21/2025    Reason for Exam (SYMPTOM  OR DIAGNOSIS REQUIRED):   Post menopausal    Preferred imaging location?:   [facility]    Release to patient:   Immediate   The patient has a good understanding of the overall plan. she agrees with it. she will call with any problems that may develop before the next visit here. Total time spent: 30 mins including face to face time and time spent for planning, charting and co-ordination of care   Naomi MARLA Chad, MD 05/21/24

## 2024-05-21 NOTE — Assessment & Plan Note (Signed)
 02/15/2020:Screening mammogram detected right breast mass. 0.4cm mass at the 6:30 position and no axillary adenopathy. Biopsy: invasive ductal carcinoma, grade 3, HER-2 negative (1+), ER+ 95%, PR- 0%, KI67 40%. T1 a N0 stage Ia clinical stage   03/20/2020:Right lumpectomy [facility]er): invasive and in situ ductal carcinoma, 0.5cm, clear margins, 4 right axillary lymph nodes negative T1 a N0 stage Ia pathologic stage 04/22/2020-05/12/2020: Adjuvant radiation With radiation she felt slightly depressed to the point that she was crying.  She improved with the counseling and support of her husband.   Treatment plan: Adjuvant antiestrogen therapy with anastrozole  1 mg daily  06/02/2020- 10/22 (stopped because of muscle aches and pains) started 09/13/21 letrozole    Letrozole  toxicities:    Improved muscle aches and pains.   Fracture of the left hip February 2022: Status post surgery.   We will obtain another bone density test next year.    Breast cancer surveillance: 1.  Breast exam 05/21/2024: Benign, right breast lymphedema 2. mammogram 03/23/2024: Benign breast density category B   Bone density 03/04/2022: T score -2.2: Repeating bone density test in May 2025.   Return to clinic in 1 year for follow-up

## 2024-05-24 DIAGNOSIS — M48061 Spinal stenosis, lumbar region without neurogenic claudication: Secondary | ICD-10-CM | POA: Diagnosis not present

## 2024-05-24 DIAGNOSIS — M47816 Spondylosis without myelopathy or radiculopathy, lumbar region: Secondary | ICD-10-CM | POA: Diagnosis not present

## 2024-05-24 DIAGNOSIS — M48062 Spinal stenosis, lumbar region with neurogenic claudication: Secondary | ICD-10-CM | POA: Diagnosis not present

## 2024-05-24 DIAGNOSIS — M5135 Other intervertebral disc degeneration, thoracolumbar region: Secondary | ICD-10-CM | POA: Diagnosis not present

## 2024-06-26 DIAGNOSIS — Z860100 Personal history of colon polyps, unspecified: Secondary | ICD-10-CM | POA: Diagnosis not present

## 2024-06-26 DIAGNOSIS — R140 Abdominal distension (gaseous): Secondary | ICD-10-CM | POA: Diagnosis not present

## 2024-06-27 DIAGNOSIS — R143 Flatulence: Secondary | ICD-10-CM | POA: Diagnosis not present

## 2024-06-27 DIAGNOSIS — R1013 Epigastric pain: Secondary | ICD-10-CM | POA: Diagnosis not present

## 2024-06-29 DIAGNOSIS — M4316 Spondylolisthesis, lumbar region: Secondary | ICD-10-CM | POA: Diagnosis not present

## 2024-06-29 DIAGNOSIS — M47816 Spondylosis without myelopathy or radiculopathy, lumbar region: Secondary | ICD-10-CM | POA: Diagnosis not present

## 2024-07-03 ENCOUNTER — Encounter (HOSPITAL_BASED_OUTPATIENT_CLINIC_OR_DEPARTMENT_OTHER): Payer: Self-pay | Admitting: Cardiology

## 2024-07-03 ENCOUNTER — Ambulatory Visit (HOSPITAL_BASED_OUTPATIENT_CLINIC_OR_DEPARTMENT_OTHER): Admitting: Cardiology

## 2024-07-03 VITALS — BP 126/78 | HR 68 | Ht 64.0 in | Wt 171.9 lb

## 2024-07-03 DIAGNOSIS — I77819 Aortic ectasia, unspecified site: Secondary | ICD-10-CM

## 2024-07-03 DIAGNOSIS — I10 Essential (primary) hypertension: Secondary | ICD-10-CM | POA: Diagnosis not present

## 2024-07-03 DIAGNOSIS — R002 Palpitations: Secondary | ICD-10-CM | POA: Diagnosis not present

## 2024-07-03 DIAGNOSIS — I2510 Atherosclerotic heart disease of native coronary artery without angina pectoris: Secondary | ICD-10-CM | POA: Diagnosis not present

## 2024-07-03 DIAGNOSIS — I341 Nonrheumatic mitral (valve) prolapse: Secondary | ICD-10-CM | POA: Diagnosis not present

## 2024-07-03 DIAGNOSIS — E1159 Type 2 diabetes mellitus with other circulatory complications: Secondary | ICD-10-CM | POA: Diagnosis not present

## 2024-07-03 DIAGNOSIS — I493 Ventricular premature depolarization: Secondary | ICD-10-CM

## 2024-07-03 DIAGNOSIS — E7800 Pure hypercholesterolemia, unspecified: Secondary | ICD-10-CM | POA: Diagnosis not present

## 2024-07-03 NOTE — Progress Notes (Signed)
 Cardiology Office Note:  .   Date:  07/03/2024  ID:  Andrea Farmer, DOB 22-Feb-1941, MRN [id number] PCP: Okey Carlin Redbird, MD  [facility] Providers Cardiologist:  Shelda Bruckner, MD {  History of Present Illness: .   Andrea Farmer is a 83 y.o. 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 She was previously a patient of Dr. Maranda and then Dr. Hobart and established care with me on 07/05/23.  Pertinent CV history: Monitor 2021 with 11% PVC burden and short NSVT. Echo with EF 60-65%. Coronary CT in 2021 with Ca score 1125, CADRADS 3 with diffuse 25-49% stenoses but negative FFR. Mid LAD with 50-69% stenosis and negative FFR.  Today: Overall doing well. Wishes she had more energy but happy with the activity she is able to do. Planning to travel soon, discussed safe ways to do this (make sure someone knows where they are, know where medical facilities are, etc).   Has struggled with constipation, working with Dr. Burnette to sort this out. Has been an intermittent struggle for most of her life. Has also been working with Dr. Mavis, planned for back injections to see what helps her back pain.   LE edema has been minimal and stable. No longer on hydrochlorothiazide  as she has not had worsening edema. Palpitations have been minimal.  ROS: Denies chest pain, shortness of breath at rest or with normal exertion. No PND, orthopnea, worsening LE edema or unexpected weight gain. No syncope. ROS otherwise negative except as noted.   Studies Reviewed: SABRA    EKG:  EKG Interpretation Date/Time:  Tuesday July 03 2024 14:05:24 EDT Ventricular Rate:  63 PR Interval:  182 QRS Duration:  102 QT Interval:  422 QTC Calculation: 431 R Axis:   -38  Text Interpretation: Normal sinus rhythm Left axis deviation When compared with ECG of  05-Jul-2023 13:49, No significant change was found Confirmed by Bruckner Shelda [phone]) on 07/03/2024 2:30:27 PM    Physical Exam:   VS:  BP 126/78   Pulse 68   Ht 5' 4 (1.626 m)   Wt 171 lb 14.4 oz (78 kg)   SpO2 95%   BMI 29.51 kg/m    Wt Readings from Last 3 Encounters:  07/03/24 171 lb 14.4 oz (78 kg)  05/21/24 168 lb 8 oz (76.4 kg)  01/04/24 174 lb 14.4 oz (79.3 kg)    GEN: Well nourished, well developed in no acute distress HEENT: Normal, moist mucous membranes NECK: No JVD CARDIAC: regular rhythm, normal S1 and S2, no rubs or gallops. 1/6 systolic murmur. VASCULAR: Radial and DP pulses 2+ bilaterally. No carotid bruits RESPIRATORY:  Clear to auscultation without rales, wheezing or rhonchi  ABDOMEN: Soft, non-tender, non-distended MUSCULOSKELETAL:  Ambulates independently SKIN: Warm and dry, no edema. Bilateral prominent ankle bursa NEUROLOGIC:  Alert and oriented x 3. No focal neuro deficits noted. PSYCHIATRIC:  Normal affect    ASSESSMENT AND PLAN: .    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 After shared decision making, will recheck in 2-3 years (last 2024, so would be 2026-2027 if not done sooner). -BP control as below. 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

## 2024-07-03 NOTE — Patient Instructions (Signed)
 Medication Instructions:  No changes *If you need a refill on your cardiac medications before your next appointment, please call your pharmacy*  Lab Work: none If you have labs (blood work) drawn today and your tests are completely normal, you will receive your results only by: MyChart Message (if you have MyChart) OR A paper copy in the mail If you have any lab test that is abnormal or we need to change your treatment, we will call you to review the results.  Testing/Procedures: none  Follow-Up: At [facility], you and your health needs are our priority.  As part of our continuing mission to provide you with exceptional heart care, our providers are all part of one team.  This team includes your primary Cardiologist (physician) and Advanced Practice Providers or APPs (Physician Assistants and Nurse Practitioners) who all work together to provide you with the care you need, when you need it.  Your next appointment:   12 month(s)  Provider:   Shelda Bruckner, MD, Rosaline Bane, NP, or Reche Finder, NP

## 2024-07-04 DIAGNOSIS — Z23 Encounter for immunization: Secondary | ICD-10-CM | POA: Diagnosis not present

## 2024-07-19 DIAGNOSIS — R130 Aphagia: Secondary | ICD-10-CM | POA: Diagnosis not present

## 2024-07-31 DIAGNOSIS — M47816 Spondylosis without myelopathy or radiculopathy, lumbar region: Secondary | ICD-10-CM | POA: Diagnosis not present

## 2024-08-08 DIAGNOSIS — E7800 Pure hypercholesterolemia, unspecified: Secondary | ICD-10-CM | POA: Diagnosis not present

## 2024-08-08 DIAGNOSIS — E1169 Type 2 diabetes mellitus with other specified complication: Secondary | ICD-10-CM | POA: Diagnosis not present

## 2024-08-08 DIAGNOSIS — M25569 Pain in unspecified knee: Secondary | ICD-10-CM | POA: Diagnosis not present

## 2024-08-08 DIAGNOSIS — I10 Essential (primary) hypertension: Secondary | ICD-10-CM | POA: Diagnosis not present

## 2024-08-08 DIAGNOSIS — Z6831 Body mass index (BMI) 31.0-31.9, adult: Secondary | ICD-10-CM | POA: Diagnosis not present

## 2024-08-10 DIAGNOSIS — Z23 Encounter for immunization: Secondary | ICD-10-CM | POA: Diagnosis not present

## 2024-08-14 DIAGNOSIS — M47816 Spondylosis without myelopathy or radiculopathy, lumbar region: Secondary | ICD-10-CM | POA: Diagnosis not present

## 2024-08-20 DIAGNOSIS — M25561 Pain in right knee: Secondary | ICD-10-CM | POA: Diagnosis not present

## 2024-08-20 DIAGNOSIS — M25662 Stiffness of left knee, not elsewhere classified: Secondary | ICD-10-CM | POA: Diagnosis not present

## 2024-08-20 DIAGNOSIS — M25661 Stiffness of right knee, not elsewhere classified: Secondary | ICD-10-CM | POA: Diagnosis not present

## 2024-08-20 DIAGNOSIS — M25562 Pain in left knee: Secondary | ICD-10-CM | POA: Diagnosis not present

## 2024-08-23 DIAGNOSIS — M1711 Unilateral primary osteoarthritis, right knee: Secondary | ICD-10-CM | POA: Diagnosis not present

## 2024-08-23 DIAGNOSIS — M25662 Stiffness of left knee, not elsewhere classified: Secondary | ICD-10-CM | POA: Diagnosis not present

## 2024-08-23 DIAGNOSIS — M25561 Pain in right knee: Secondary | ICD-10-CM | POA: Diagnosis not present

## 2024-08-23 DIAGNOSIS — M25661 Stiffness of right knee, not elsewhere classified: Secondary | ICD-10-CM | POA: Diagnosis not present

## 2024-08-23 DIAGNOSIS — M25562 Pain in left knee: Secondary | ICD-10-CM | POA: Diagnosis not present

## 2024-08-25 ENCOUNTER — Other Ambulatory Visit (HOSPITAL_BASED_OUTPATIENT_CLINIC_OR_DEPARTMENT_OTHER)

## 2024-08-27 DIAGNOSIS — R197 Diarrhea, unspecified: Secondary | ICD-10-CM | POA: Diagnosis not present

## 2024-08-27 DIAGNOSIS — R143 Flatulence: Secondary | ICD-10-CM | POA: Diagnosis not present

## 2024-08-27 DIAGNOSIS — C50919 Malignant neoplasm of unspecified site of unspecified female breast: Secondary | ICD-10-CM | POA: Diagnosis not present

## 2024-08-27 DIAGNOSIS — I10 Essential (primary) hypertension: Secondary | ICD-10-CM | POA: Diagnosis not present

## 2024-08-27 DIAGNOSIS — E1169 Type 2 diabetes mellitus with other specified complication: Secondary | ICD-10-CM | POA: Diagnosis not present

## 2024-08-27 DIAGNOSIS — M1990 Unspecified osteoarthritis, unspecified site: Secondary | ICD-10-CM | POA: Diagnosis not present

## 2024-08-27 DIAGNOSIS — Z6830 Body mass index (BMI) 30.0-30.9, adult: Secondary | ICD-10-CM | POA: Diagnosis not present

## 2024-08-27 DIAGNOSIS — M8580 Other specified disorders of bone density and structure, unspecified site: Secondary | ICD-10-CM | POA: Diagnosis not present

## 2024-08-27 DIAGNOSIS — Z23 Encounter for immunization: Secondary | ICD-10-CM | POA: Diagnosis not present

## 2024-08-27 DIAGNOSIS — E7800 Pure hypercholesterolemia, unspecified: Secondary | ICD-10-CM | POA: Diagnosis not present

## 2024-08-27 DIAGNOSIS — Z0000 Encounter for general adult medical examination without abnormal findings: Secondary | ICD-10-CM | POA: Diagnosis not present

## 2024-08-28 DIAGNOSIS — R197 Diarrhea, unspecified: Secondary | ICD-10-CM | POA: Diagnosis not present

## 2024-09-04 DIAGNOSIS — M25661 Stiffness of right knee, not elsewhere classified: Secondary | ICD-10-CM | POA: Diagnosis not present

## 2024-09-04 DIAGNOSIS — M25562 Pain in left knee: Secondary | ICD-10-CM | POA: Diagnosis not present

## 2024-09-04 DIAGNOSIS — M25662 Stiffness of left knee, not elsewhere classified: Secondary | ICD-10-CM | POA: Diagnosis not present

## 2024-09-05 DIAGNOSIS — M25661 Stiffness of right knee, not elsewhere classified: Secondary | ICD-10-CM | POA: Diagnosis not present

## 2024-09-05 DIAGNOSIS — M25562 Pain in left knee: Secondary | ICD-10-CM | POA: Diagnosis not present

## 2024-09-05 DIAGNOSIS — M25561 Pain in right knee: Secondary | ICD-10-CM | POA: Diagnosis not present

## 2024-09-05 DIAGNOSIS — M25662 Stiffness of left knee, not elsewhere classified: Secondary | ICD-10-CM | POA: Diagnosis not present

## 2024-09-10 DIAGNOSIS — D225 Melanocytic nevi of trunk: Secondary | ICD-10-CM | POA: Diagnosis not present

## 2024-09-10 DIAGNOSIS — L821 Other seborrheic keratosis: Secondary | ICD-10-CM | POA: Diagnosis not present

## 2024-09-10 DIAGNOSIS — L814 Other melanin hyperpigmentation: Secondary | ICD-10-CM | POA: Diagnosis not present

## 2024-09-10 DIAGNOSIS — D2271 Melanocytic nevi of right lower limb, including hip: Secondary | ICD-10-CM | POA: Diagnosis not present

## 2024-09-10 DIAGNOSIS — D692 Other nonthrombocytopenic purpura: Secondary | ICD-10-CM | POA: Diagnosis not present

## 2024-09-10 DIAGNOSIS — D1801 Hemangioma of skin and subcutaneous tissue: Secondary | ICD-10-CM | POA: Diagnosis not present

## 2024-09-10 DIAGNOSIS — L570 Actinic keratosis: Secondary | ICD-10-CM | POA: Diagnosis not present

## 2024-09-10 DIAGNOSIS — L720 Epidermal cyst: Secondary | ICD-10-CM | POA: Diagnosis not present

## 2024-09-10 DIAGNOSIS — D2272 Melanocytic nevi of left lower limb, including hip: Secondary | ICD-10-CM | POA: Diagnosis not present

## 2024-09-11 DIAGNOSIS — M47816 Spondylosis without myelopathy or radiculopathy, lumbar region: Secondary | ICD-10-CM | POA: Diagnosis not present

## 2024-09-19 DIAGNOSIS — M25561 Pain in right knee: Secondary | ICD-10-CM | POA: Diagnosis not present

## 2024-09-19 DIAGNOSIS — M25562 Pain in left knee: Secondary | ICD-10-CM | POA: Diagnosis not present

## 2024-09-19 DIAGNOSIS — M25662 Stiffness of left knee, not elsewhere classified: Secondary | ICD-10-CM | POA: Diagnosis not present

## 2024-09-19 DIAGNOSIS — M25661 Stiffness of right knee, not elsewhere classified: Secondary | ICD-10-CM | POA: Diagnosis not present

## 2024-09-20 DIAGNOSIS — Z6827 Body mass index (BMI) 27.0-27.9, adult: Secondary | ICD-10-CM | POA: Diagnosis not present

## 2024-09-20 DIAGNOSIS — M47816 Spondylosis without myelopathy or radiculopathy, lumbar region: Secondary | ICD-10-CM | POA: Diagnosis not present

## 2024-09-20 DIAGNOSIS — R140 Abdominal distension (gaseous): Secondary | ICD-10-CM | POA: Diagnosis not present

## 2024-09-21 DIAGNOSIS — M25662 Stiffness of left knee, not elsewhere classified: Secondary | ICD-10-CM | POA: Diagnosis not present

## 2024-09-21 DIAGNOSIS — M25562 Pain in left knee: Secondary | ICD-10-CM | POA: Diagnosis not present

## 2024-09-21 DIAGNOSIS — M25661 Stiffness of right knee, not elsewhere classified: Secondary | ICD-10-CM | POA: Diagnosis not present

## 2024-09-21 DIAGNOSIS — M25561 Pain in right knee: Secondary | ICD-10-CM | POA: Diagnosis not present

## 2024-09-25 ENCOUNTER — Other Ambulatory Visit: Payer: Self-pay | Admitting: Family Medicine

## 2024-09-25 DIAGNOSIS — Z1231 Encounter for screening mammogram for malignant neoplasm of breast: Secondary | ICD-10-CM

## 2024-10-02 ENCOUNTER — Other Ambulatory Visit (HOSPITAL_BASED_OUTPATIENT_CLINIC_OR_DEPARTMENT_OTHER)

## 2024-11-09 ENCOUNTER — Emergency Department (HOSPITAL_COMMUNITY)

## 2024-11-09 ENCOUNTER — Encounter (HOSPITAL_COMMUNITY): Payer: Self-pay

## 2024-11-09 ENCOUNTER — Observation Stay (HOSPITAL_COMMUNITY)
Admission: EM | Admit: 2024-11-09 | Discharge: 2024-11-10 | Disposition: A | Attending: Emergency Medicine | Admitting: Emergency Medicine

## 2024-11-09 ENCOUNTER — Other Ambulatory Visit: Payer: Self-pay

## 2024-11-09 DIAGNOSIS — Z96653 Presence of artificial knee joint, bilateral: Secondary | ICD-10-CM | POA: Insufficient documentation

## 2024-11-09 DIAGNOSIS — Z79899 Other long term (current) drug therapy: Secondary | ICD-10-CM | POA: Diagnosis not present

## 2024-11-09 DIAGNOSIS — Z7984 Long term (current) use of oral hypoglycemic drugs: Secondary | ICD-10-CM | POA: Insufficient documentation

## 2024-11-09 DIAGNOSIS — Z87891 Personal history of nicotine dependence: Secondary | ICD-10-CM | POA: Insufficient documentation

## 2024-11-09 DIAGNOSIS — C50511 Malignant neoplasm of lower-outer quadrant of right female breast: Secondary | ICD-10-CM | POA: Diagnosis not present

## 2024-11-09 DIAGNOSIS — E119 Type 2 diabetes mellitus without complications: Secondary | ICD-10-CM | POA: Diagnosis not present

## 2024-11-09 DIAGNOSIS — I493 Ventricular premature depolarization: Secondary | ICD-10-CM | POA: Diagnosis not present

## 2024-11-09 DIAGNOSIS — Z170 Estrogen receptor positive status [ER+]: Secondary | ICD-10-CM | POA: Diagnosis not present

## 2024-11-09 DIAGNOSIS — K589 Irritable bowel syndrome without diarrhea: Secondary | ICD-10-CM | POA: Diagnosis present

## 2024-11-09 DIAGNOSIS — Z7982 Long term (current) use of aspirin: Secondary | ICD-10-CM | POA: Insufficient documentation

## 2024-11-09 DIAGNOSIS — R1011 Right upper quadrant pain: Secondary | ICD-10-CM | POA: Diagnosis present

## 2024-11-09 DIAGNOSIS — K819 Cholecystitis, unspecified: Principal | ICD-10-CM | POA: Insufficient documentation

## 2024-11-09 DIAGNOSIS — E7800 Pure hypercholesterolemia, unspecified: Secondary | ICD-10-CM

## 2024-11-09 DIAGNOSIS — I2510 Atherosclerotic heart disease of native coronary artery without angina pectoris: Secondary | ICD-10-CM | POA: Diagnosis not present

## 2024-11-09 DIAGNOSIS — Z789 Other specified health status: Secondary | ICD-10-CM

## 2024-11-09 DIAGNOSIS — E785 Hyperlipidemia, unspecified: Secondary | ICD-10-CM

## 2024-11-09 DIAGNOSIS — E782 Mixed hyperlipidemia: Secondary | ICD-10-CM

## 2024-11-09 DIAGNOSIS — I10 Essential (primary) hypertension: Secondary | ICD-10-CM | POA: Diagnosis present

## 2024-11-09 LAB — CBC WITH DIFFERENTIAL/PLATELET
Abs Immature Granulocytes: 0.05 10*3/uL (ref 0.00–0.07)
Basophils Absolute: 0.1 10*3/uL (ref 0.0–0.1)
Basophils Relative: 0 %
Eosinophils Absolute: 0.1 10*3/uL (ref 0.0–0.5)
Eosinophils Relative: 1 %
HCT: 45.1 % (ref 36.0–46.0)
Hemoglobin: 15.1 g/dL — ABNORMAL HIGH (ref 12.0–15.0)
Immature Granulocytes: 0 %
Lymphocytes Relative: 11 %
Lymphs Abs: 1.4 10*3/uL (ref 0.7–4.0)
MCH: 30.7 pg (ref 26.0–34.0)
MCHC: 33.5 g/dL (ref 30.0–36.0)
MCV: 91.7 fL (ref 80.0–100.0)
Monocytes Absolute: 0.6 10*3/uL (ref 0.1–1.0)
Monocytes Relative: 5 %
Neutro Abs: 10 10*3/uL — ABNORMAL HIGH (ref 1.7–7.7)
Neutrophils Relative %: 83 %
Platelets: 324 10*3/uL (ref 150–400)
RBC: 4.92 MIL/uL (ref 3.87–5.11)
RDW: 13 % (ref 11.5–15.5)
WBC: 12.1 10*3/uL — ABNORMAL HIGH (ref 4.0–10.5)
nRBC: 0 % (ref 0.0–0.2)

## 2024-11-09 LAB — COMPREHENSIVE METABOLIC PANEL WITH GFR
ALT: 11 U/L (ref 0–44)
AST: 24 U/L (ref 15–41)
Albumin: 4.8 g/dL (ref 3.5–5.0)
Alkaline Phosphatase: 107 U/L (ref 38–126)
Anion gap: 12 (ref 5–15)
BUN: 13 mg/dL (ref 8–23)
CO2: 26 mmol/L (ref 22–32)
Calcium: 10.4 mg/dL — ABNORMAL HIGH (ref 8.9–10.3)
Chloride: 102 mmol/L (ref 98–111)
Creatinine, Ser: 0.61 mg/dL (ref 0.44–1.00)
GFR, Estimated: >60 mL/min
Glucose, Bld: 142 mg/dL — ABNORMAL HIGH (ref 70–99)
Potassium: 3.7 mmol/L (ref 3.5–5.1)
Sodium: 141 mmol/L (ref 135–145)
Total Bilirubin: 0.4 mg/dL (ref 0.0–1.2)
Total Protein: 8.4 g/dL — ABNORMAL HIGH (ref 6.5–8.1)

## 2024-11-09 LAB — URINALYSIS, ROUTINE W REFLEX MICROSCOPIC
Bacteria, UA: NONE SEEN
Bilirubin Urine: NEGATIVE
Glucose, UA: >=500 mg/dL — AB
Hgb urine dipstick: NEGATIVE
Ketones, ur: NEGATIVE mg/dL
Leukocytes,Ua: NEGATIVE
Nitrite: NEGATIVE
Protein, ur: NEGATIVE mg/dL
Specific Gravity, Urine: 1.017 (ref 1.005–1.030)
pH: 6 (ref 5.0–8.0)

## 2024-11-09 LAB — GLUCOSE, CAPILLARY: Glucose-Capillary: 123 mg/dL — ABNORMAL HIGH (ref 70–99)

## 2024-11-09 LAB — LIPASE, BLOOD: Lipase: 45 U/L (ref 11–51)

## 2024-11-09 LAB — PROCALCITONIN: Procalcitonin: <0.1 ng/mL

## 2024-11-09 LAB — TROPONIN T, HIGH SENSITIVITY
Troponin T High Sensitivity: 8 ng/L (ref 0–19)
Troponin T High Sensitivity: 9 ng/L (ref 0–19)

## 2024-11-09 MED ORDER — INSULIN ASPART 100 UNIT/ML IJ SOLN: 0.0000 [IU] | Freq: Three times a day (TID) | INTRAMUSCULAR | Status: DC

## 2024-11-09 MED ORDER — PANTOPRAZOLE SODIUM 40 MG IV SOLR
40.0000 mg | INTRAVENOUS | Status: DC
  Administered 2024-11-09: 40 mg via INTRAVENOUS
  Filled 2024-11-09 (×2): qty 10

## 2024-11-09 MED ORDER — BENAZEPRIL HCL 20 MG PO TABS
40.0000 mg | ORAL_TABLET | Freq: Every day | ORAL | Status: DC
  Administered 2024-11-10: 40 mg via ORAL
  Filled 2024-11-09: qty 2

## 2024-11-09 MED ORDER — METOPROLOL SUCCINATE ER 25 MG PO TB24
25.0000 mg | ORAL_TABLET | Freq: Every day | ORAL | Status: DC
  Administered 2024-11-10: 25 mg via ORAL
  Filled 2024-11-09: qty 1

## 2024-11-09 MED ORDER — ACETAMINOPHEN 650 MG RE SUPP: 650.0000 mg | Freq: Four times a day (QID) | RECTAL | Status: DC | PRN

## 2024-11-09 MED ORDER — LETROZOLE 2.5 MG PO TABS
2.5000 mg | ORAL_TABLET | Freq: Every day | ORAL | Status: DC
  Administered 2024-11-10: 2.5 mg via ORAL
  Filled 2024-11-09: qty 1

## 2024-11-09 MED ORDER — ACETAMINOPHEN 325 MG PO TABS: 650.0000 mg | ORAL_TABLET | Freq: Four times a day (QID) | ORAL | Status: DC | PRN

## 2024-11-09 MED ORDER — ONDANSETRON HCL 4 MG/2ML IJ SOLN
4.0000 mg | Freq: Once | INTRAMUSCULAR | Status: AC
  Administered 2024-11-09: 4 mg via INTRAVENOUS
  Filled 2024-11-09: qty 2

## 2024-11-09 MED ORDER — ROSUVASTATIN CALCIUM 20 MG PO TABS
20.0000 mg | ORAL_TABLET | Freq: Every day | ORAL | Status: DC
  Administered 2024-11-10: 20 mg via ORAL
  Filled 2024-11-09: qty 1

## 2024-11-09 MED ORDER — ONDANSETRON HCL 4 MG PO TABS: 4.0000 mg | ORAL_TABLET | Freq: Four times a day (QID) | ORAL | Status: DC | PRN

## 2024-11-09 MED ORDER — MORPHINE SULFATE (PF) 2 MG/ML IV SOLN: 2.0000 mg | INTRAVENOUS | Status: DC | PRN

## 2024-11-09 MED ORDER — PIPERACILLIN-TAZOBACTAM 3.375 G IVPB 30 MIN
3.3750 g | Freq: Once | INTRAVENOUS | Status: AC
  Administered 2024-11-09: 3.375 g via INTRAVENOUS
  Filled 2024-11-09: qty 50

## 2024-11-09 MED ORDER — SODIUM CHLORIDE 0.9 % IV BOLUS
1000.0000 mL | Freq: Once | INTRAVENOUS | Status: AC
  Administered 2024-11-09: 1000 mL via INTRAVENOUS

## 2024-11-09 MED ORDER — IOHEXOL 300 MG/ML  SOLN
100.0000 mL | Freq: Once | INTRAMUSCULAR | Status: AC | PRN
  Administered 2024-11-09: 100 mL via INTRAVENOUS

## 2024-11-09 MED ORDER — EZETIMIBE 10 MG PO TABS
10.0000 mg | ORAL_TABLET | Freq: Every day | ORAL | Status: DC
  Administered 2024-11-10: 10 mg via ORAL
  Filled 2024-11-09: qty 1

## 2024-11-09 MED ORDER — GADOBUTROL 1 MMOL/ML IV SOLN
8.0000 mL | Freq: Once | INTRAVENOUS | Status: AC | PRN
  Administered 2024-11-09: 8 mL via INTRAVENOUS

## 2024-11-09 MED ORDER — MORPHINE SULFATE (PF) 4 MG/ML IV SOLN
4.0000 mg | Freq: Once | INTRAVENOUS | Status: AC
  Administered 2024-11-09: 4 mg via INTRAVENOUS
  Filled 2024-11-09: qty 1

## 2024-11-09 MED ORDER — ALIGN 4 MG PO CAPS: 4.0000 mg | ORAL_CAPSULE | Freq: Every day | ORAL | Status: DC

## 2024-11-09 MED ORDER — ONDANSETRON HCL 4 MG/2ML IJ SOLN: 4.0000 mg | Freq: Four times a day (QID) | INTRAMUSCULAR | Status: DC | PRN

## 2024-11-09 MED ORDER — RISAQUAD PO CAPS
1.0000 | ORAL_CAPSULE | Freq: Every day | ORAL | Status: DC
  Administered 2024-11-10: 1 via ORAL
  Filled 2024-11-09: qty 1

## 2024-11-09 MED ORDER — POTASSIUM CHLORIDE CRYS ER 20 MEQ PO TBCR
20.0000 meq | EXTENDED_RELEASE_TABLET | Freq: Every day | ORAL | Status: DC
  Administered 2024-11-10: 20 meq via ORAL
  Filled 2024-11-09: qty 1

## 2024-11-09 MED ORDER — AMLODIPINE BESYLATE 5 MG PO TABS
5.0000 mg | ORAL_TABLET | Freq: Every day | ORAL | Status: DC
  Administered 2024-11-10: 5 mg via ORAL
  Filled 2024-11-09: qty 1

## 2024-11-09 MED ORDER — PIPERACILLIN-TAZOBACTAM 3.375 G IVPB
3.3750 g | Freq: Three times a day (TID) | INTRAVENOUS | Status: DC
  Administered 2024-11-09 – 2024-11-10 (×2): 3.375 g via INTRAVENOUS
  Filled 2024-11-09 (×3): qty 50

## 2024-11-09 NOTE — Assessment & Plan Note (Signed)
 Continue amlodipine  5mg , benzapril 40mg  and metoprolol  25mg  daily

## 2024-11-09 NOTE — Assessment & Plan Note (Signed)
 Repeat in AM.

## 2024-11-09 NOTE — Consult Note (Addendum)
 "    Andrea Farmer 1941/04/19  [id number].    Requesting MD: Dean Clarity MD Chief Complaint/Reason for Consult: possible cholecystitis   HPI:  Andrea Farmer is an 84 y/o F with PMH HTN, HLD, GERD, IBS, migraine HA, DM2, and breast cancer who presented to the ED via EMS with abdominal pain. Reports she had lasasgna for dinner around 7 PM. She went to bed and woke up around 3-4 AM with epigastric pain that radiated through to her back. Associated with abdominal fullness/firmness, nausea. She had what she describes as explosive gas followed by 2 semi-solid, non-bloody BMs with little relief of symptoms so she came to the ED. She also tried creon, alka-seltzer without relief. She reports similar, but much less severe, episodes in the past month. States 5 out of 7 days of the week she wakes up with abdominal fullness and nausea, symptoms are relieved when she passes gas or belches. She has noticed alcohol, rich foods, or fibrous food exacerbate these episodes. She saw her GI physician who suspected a pancreatic issue and gave her creon. Denies fever, chills, chest pain, melena, hematochezia, or changes in bowel habits. She adds that she eats less than she used to; sleeping in until late morning and only eating 2 meals a day, which she attributes to getting older  Denies use of blood thinners Social Hx: lives at home with her partner of 35 years. Former smoker socially in college. Alcohol about once weekly. Abdominal surgical history: abdominal hysterectomy, rectal procedures for what sounds like anal stenosis.  ROS: Review of Systems  All other systems reviewed and are negative.   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   Cataract    immature but doesn't know which eye    Diabetes mellitus without complication (HCC)    High cholesterol    takes Crestor  daily   History of colon polyps    benign   History of migraine    Hypertension    takes Lotrel and HCTZ dailydaily   Irritable bowel syndrome 03/11/2016   Joint pain    Joint swelling    Osteoarthritis of knee 03/11/2016   Personal history of radiation therapy 2021   IDC   Pneumonia    30 plus yrs ago    Past Surgical History:  Procedure Laterality Date   ABDOMINAL HYSTERECTOMY     BREAST BIOPSY Right 05/14/2015   BREAST EXCISIONAL BIOPSY Left    BREAST EXCISIONAL BIOPSY Right 2016   BREAST LUMPECTOMY Right 02/25/2020   IDC   BREAST LUMPECTOMY WITH RADIOACTIVE SEED AND SENTINEL LYMPH NODE BIOPSY Right 03/20/2020   Procedure: RIGHT BREAST LUMPECTOMY WITH RADIOACTIVE SEED AND SENTINEL LYMPH NODE BIOPSY;  Surgeon: Curvin Deward MOULD, MD;  Location: [facility];  Service: General;  Laterality: Right;   BREAST LUMPECTOMY WITH RADIOACTIVE SEED LOCALIZATION Right 04/27/2019   Procedure: RIGHT BREAST LUMPECTOMY WITH RADIOACTIVE SEED LOCALIZATION;  Surgeon: Curvin Deward MOULD, MD;  Location: [facility];  Service: General;  Laterality: Right;   EXCISION OF BREAST BIOPSY Right 04/27/2019   complex sclerosing lesion   HAND SURGERY Right 2025   INTRAMEDULLARY (IM) NAIL INTERTROCHANTERIC  Left 11/26/2020   Procedure: INTRAMEDULLARY (IM) NAIL INTERTROCHANTRIC;  Surgeon: Ernie Cough, MD;  Location: [facility];  Service: Orthopedics;  Laterality: Left;   RECTAL SURGERY     x 3   REPLACEMENT TOTAL KNEE BILATERAL     SYNOVECTOMY Left 07/21/2015   Procedure: LEFT KNEE SYNOVECTOMY with POLY EXCHANGE;  Surgeon: Marcey Raman, MD;  Location: [facility];  Service: Orthopedics;  Laterality: Left;   TOTAL KNEE REVISION Right 07/05/2016   Procedure: TOTAL KNEE REVISION WITH SCAR DEBRIDEMENT/PATELLA REVISION WITH POLY EXCHANGE;  Surgeon: Marcey Raman, MD;  Location: [facility];  Service: Orthopedics;  Laterality:  Right;    Social History:  reports that she has quit smoking. Her smoking use included cigarettes. She has never used smokeless tobacco. She reports current alcohol use. She reports that she does not use drugs.  Allergies: Allergies[1]  (Not in a hospital admission)    Physical Exam: Blood pressure (!) 136/91, pulse 64, temperature 97.6 F (36.4 C), temperature source Oral, resp. rate 18, SpO2 98%. General: Pleasant white female sitting up in bed, NAD. HEENT: head -normocephalic, atraumatic; Eyes: PERRLA, no conjunctival injection; anicteric sclerae Neck- Trachea is midline CV- RRR, normal S1/S2, no M/R/G, no lower extremity edema  Pulm- breathing is non-labored ORA  Abd- soft, non-tender, non-distended, no rebound tenderness, no hernias or masses GU- deferred  MSK- UE/LE symmetrical, no cyanosis, clubbing, or edema. Neuro- CN II-XII grossly in tact, no paresthesias. Psych- Alert and Oriented x3 with appropriate affect Skin: warm and dry, no rashes or lesions   Results for orders placed or performed during the hospital encounter of 11/09/24 (from the past 48 hours)  CBC with Differential     Status: Abnormal   Collection Time: 11/09/24  8:39 AM  Result Value Ref Range   WBC 12.1 (H) 4.0 - 10.5 K/uL   RBC 4.92 3.87 - 5.11 MIL/uL   Hemoglobin 15.1 (H) 12.0 - 15.0 g/dL   HCT 54.8 63.9 - 53.9 %   MCV 91.7 80.0 - 100.0 fL   MCH 30.7 26.0 - 34.0 pg   MCHC 33.5 30.0 - 36.0 g/dL   RDW 86.9 88.4 - 84.4 %   Platelets 324 150 - 400 K/uL   nRBC 0.0 0.0 - 0.2 %   Neutrophils Relative % 83 %   Neutro Abs 10.0 (H) 1.7 - 7.7 K/uL   Lymphocytes Relative 11 %   Lymphs Abs 1.4 0.7 - 4.0 K/uL   Monocytes Relative 5 %   Monocytes Absolute 0.6 0.1 - 1.0 K/uL   Eosinophils Relative 1 %   Eosinophils Absolute 0.1 0.0 - 0.5 K/uL   Basophils Relative 0 %   Basophils Absolute 0.1 0.0 - 0.1 K/uL   Immature Granulocytes 0 %   Abs Immature Granulocytes 0.05 0.00 - 0.07 K/uL    Comment:  Performed at [facility], [address], [place], [place] [address]  Comprehensive metabolic panel     Status: Abnormal   Collection Time: 11/09/24  8:39 AM  Result Value Ref Range   Sodium 141 135 - 145 mmol/L   Potassium 3.7 3.5 - 5.1 mmol/L   Chloride 102 98 - 111 mmol/L   CO2 26 22 - 32 mmol/L   Glucose, Bld 142 (H) 70 - 99 mg/dL    Comment: Glucose reference range applies only to samples taken after fasting for at least 8 hours.   BUN 13 8 - 23 mg/dL   Creatinine, Ser 9.38 0.44 - 1.00 mg/dL  Calcium  10.4 (H) 8.9 - 10.3 mg/dL   Total Protein 8.4 (H) 6.5 - 8.1 g/dL   Albumin 4.8 3.5 - 5.0 g/dL   AST 24 15 - 41 U/L   ALT 11 0 - 44 U/L   Alkaline Phosphatase 107 38 - 126 U/L   Total Bilirubin 0.4 0.0 - 1.2 mg/dL   GFR, Estimated >39 >39 mL/min    Comment: (NOTE) Calculated using the CKD-EPI Creatinine Equation (2021)    Anion gap 12 5 - 15    Comment: Performed at [facility], [address] [place], [place] [address]  Lipase, blood     Status: None   Collection Time: 11/09/24  8:39 AM  Result Value Ref Range   Lipase 45 11 - 51 U/L    Comment: Performed at [facility], [address], [place], [place] [address]  Urinalysis, Routine w reflex microscopic -Urine, Clean Catch     Status: Abnormal   Collection Time: 11/09/24  8:39 AM  Result Value Ref Range   Color, Urine STRAW (A) YELLOW   APPearance CLEAR CLEAR   Specific Gravity, Urine 1.017 1.005 - 1.030   pH 6.0 5.0 - 8.0   Glucose, UA >=500 (A) NEGATIVE mg/dL   Hgb urine dipstick NEGATIVE NEGATIVE   Bilirubin Urine NEGATIVE NEGATIVE   Ketones, ur NEGATIVE NEGATIVE mg/dL   Protein, ur NEGATIVE NEGATIVE mg/dL   Nitrite NEGATIVE NEGATIVE   Leukocytes,Ua NEGATIVE NEGATIVE   RBC / HPF 0-5 0 - 5 RBC/hpf   WBC, UA 0-5 0 - 5 WBC/hpf   Bacteria, UA NONE SEEN NONE SEEN   Squamous Epithelial / HPF 0-5 0 - 5 /HPF    Comment: Performed at [facility], [address] [place], [place] [address]   MR ABDOMEN MRCP W WO CONTRAST Result Date: 11/09/2024 EXAM: MRI Abdomen Without then with Contrast 11/09/2024 02:32:25 PM TECHNIQUE: Multiplanar multisequence MRI of the abdomen was performed without and with the administration of intravenous contrast. 8 mL (gadobutrol  (GADAVIST ) 1 MMOL/ML injection 8 mL GADOBUTROL  1 MMOL/ML IV SOLN) was administered. COMPARISON: CT abdomen 11/09/2024. CLINICAL HISTORY: Distended gallbladder and biliary dilatation. FINDINGS: LIVER: Borderline intrahepatic biliary dilatation. GALLBLADDER AND BILIARY SYSTEM: Gallbladder wall thickening is observed potentially with trace pericholecystic fluid. Motion artifact is present, reducing diagnostic sensitivity and specificity. Artifact. Bile duct 8 mm in diameter, upper normal size for age and similar to the 05/30/2023 exam. No obvious filling defect is identified in the common bile duct, although assessment is limited by motion artifact. Slightly blunted distal margin of the common bile duct observed in the vicinity of the ampulla, with no abnormal enhancing mass in this vicinity to suggest pancreatic head or ampullary mass. Situated arterial phase enhancement along the gallbladder fossa, which can sometimes be associated with cholecystitis. No well defined gallstones are seen. SPLEEN: Unremarkable. PANCREAS: Dorsal pancreatic duct 4 mm, mildly dilated, but primarily in the pancreatic head, with no duct dilatation in the pancreatic body or tail. ADRENAL GLANDS: Unremarkable. KIDNEYS: Bosniak category 2 cyst of the right kidney upper pole measuring 1.6 x 2.0 cm with high precontrast T1 signal characteristics and no enhancement. No further imaging workup of this lesion is indicated. LYMPH NODES: No lymphadenopathy. VASCULATURE: Unremarkable. PERITONEUM: No ascites. BOWEL: Grossly unremarkable. ABDOMINAL WALL: No acute abnormality. BONES: Levoconvex thoracolumbar scoliosis with rotary  component. Lumbar spondylosis. LUNGS: Atelectasis or scarring in the posterior basal segment left lower lobe. IMPRESSION: 1. Distended gallbladder with wall thickening, trace  pericholecystic fluid, and arterial phase enhancement along the gallbladder fossa, which can be associated with cholecystitis, without a well-defined gallstone. 2. Borderline intrahepatic biliary dilatation with common bile duct measuring 8 mm (upper normal for age) and no obvious common bile duct filling defect, with slightly blunted distal common bile duct margin. 3. Mild pancreatic duct dilatation up to 0.4 cm in the pancreatic head , although normal caliber in the pancreatic body and tail. No underlying pancreatic lesion observed. 4. Atelectasis or scarring in the posterior basal segment of the left lower lobe. 5. Levoconvex thoracolumbar scoliosis with a rotary component. 6. Lumbar spondylosis. Electronically signed by: Ryan Salvage MD 11/09/2024 02:56 PM EST RP Workstation: [id number]   MR 3D Recon At Scanner Result Date: 11/09/2024 EXAM: MRI Abdomen Without then with Contrast 11/09/2024 02:32:25 PM TECHNIQUE: Multiplanar multisequence MRI of the abdomen was performed without and with the administration of intravenous contrast. 8 mL (gadobutrol  (GADAVIST ) 1 MMOL/ML injection 8 mL GADOBUTROL  1 MMOL/ML IV SOLN) was administered. COMPARISON: CT abdomen 11/09/2024. CLINICAL HISTORY: Distended gallbladder and biliary dilatation. FINDINGS: LIVER: Borderline intrahepatic biliary dilatation. GALLBLADDER AND BILIARY SYSTEM: Gallbladder wall thickening is observed potentially with trace pericholecystic fluid. Motion artifact is present, reducing diagnostic sensitivity and specificity. Artifact. Bile duct 8 mm in diameter, upper normal size for age and similar to the 05/30/2023 exam. No obvious filling defect is identified in the common bile duct, although assessment is limited by motion artifact. Slightly blunted distal margin of the common  bile duct observed in the vicinity of the ampulla, with no abnormal enhancing mass in this vicinity to suggest pancreatic head or ampullary mass. Situated arterial phase enhancement along the gallbladder fossa, which can sometimes be associated with cholecystitis. No well defined gallstones are seen. SPLEEN: Unremarkable. PANCREAS: Dorsal pancreatic duct 4 mm, mildly dilated, but primarily in the pancreatic head, with no duct dilatation in the pancreatic body or tail. ADRENAL GLANDS: Unremarkable. KIDNEYS: Bosniak category 2 cyst of the right kidney upper pole measuring 1.6 x 2.0 cm with high precontrast T1 signal characteristics and no enhancement. No further imaging workup of this lesion is indicated. LYMPH NODES: No lymphadenopathy. VASCULATURE: Unremarkable. PERITONEUM: No ascites. BOWEL: Grossly unremarkable. ABDOMINAL WALL: No acute abnormality. BONES: Levoconvex thoracolumbar scoliosis with rotary component. Lumbar spondylosis. LUNGS: Atelectasis or scarring in the posterior basal segment left lower lobe. IMPRESSION: 1. Distended gallbladder with wall thickening, trace pericholecystic fluid, and arterial phase enhancement along the gallbladder fossa, which can be associated with cholecystitis, without a well-defined gallstone. 2. Borderline intrahepatic biliary dilatation with common bile duct measuring 8 mm (upper normal for age) and no obvious common bile duct filling defect, with slightly blunted distal common bile duct margin. 3. Mild pancreatic duct dilatation up to 0.4 cm in the pancreatic head , although normal caliber in the pancreatic body and tail. No underlying pancreatic lesion observed. 4. Atelectasis or scarring in the posterior basal segment of the left lower lobe. 5. Levoconvex thoracolumbar scoliosis with a rotary component. 6. Lumbar spondylosis. Electronically signed by: Ryan Salvage MD 11/09/2024 02:56 PM EST RP Workstation: [id number]   CT ABDOMEN PELVIS W CONTRAST Result Date:  11/09/2024 EXAM: CT ABDOMEN AND PELVIS WITH CONTRAST 11/09/2024 10:55:09 AM TECHNIQUE: CT of the abdomen and pelvis was performed with the administration of 100 mL (iohexol  (OMNIPAQUE ) 300 MG/ML solution 100 mL IOHEXOL  300 MG/ML SOLN) intravenous contrast. Multiplanar reformatted images are provided for review. Automated exposure control, iterative reconstruction, and/or weight-based adjustment of the mA/kV was utilized to reduce the radiation  dose to as low as reasonably achievable. COMPARISON: CT of the abdomen and pelvis dated 05/30/2023. CLINICAL HISTORY: Abdominal pain, acute, nonlocalized. Acute, nonlocalized abdominal pain. FINDINGS: LOWER CHEST: There is mild atelectasis present independently within the lower lobes bilaterally. LIVER: The liver is unremarkable. GALLBLADDER AND BILE DUCTS: The gallbladder is mildly distended, measuring up to 4.6 cm in diameter. There is mild intrahepatic and extrahepatic biliary ductal dilatation. The common bile duct measures up to approximately 9 mm in diameter. There are no obstructing lesions evident. SPLEEN: The spleen is unremarkable. PANCREAS: The main pancreatic duct is mildly distended within the head of the pancreas, measuring up to 5 mm in diameter. The pancreas is otherwise unremarkable. ADRENAL GLANDS: The adrenal glands are unremarkable. KIDNEYS, URETERS AND BLADDER: There is a simple partially exophytic cyst arising laterally from the superior pole of the right kidney measuring approximately 18 mm in diameter. Per consensus, no follow-up is needed for simple Bosniak type 1 and 2 renal cysts, unless the patient has a malignancy history or risk factors. No stones in the kidneys or ureters. No hydronephrosis. No perinephric or periureteral stranding. Urinary bladder is unremarkable. GI AND BOWEL: Stomach demonstrates no acute abnormality. There is no bowel obstruction. PERITONEUM AND RETROPERITONEUM: No ascites. No free air. VASCULATURE: The abdominal aorta is  normal in caliber and demonstrates moderate calcific atheromatous disease. LYMPH NODES: No lymphadenopathy. REPRODUCTIVE ORGANS: The patient is status post hysterectomy and bilateral salpingo-oophorectomy. BONES AND SOFT TISSUES: Orthopedic fixation of the left proximal femur. No acute osseous abnormality. No focal soft tissue abnormality. IMPRESSION: 1. Mildly distended gallbladder and mild intrahepatic and extrahepatic biliary ductal dilatation with the common bile duct measuring up to approximately 9 mm in diameter, without an obstructing lesion evident. Differential considerations include choledocholithiasis (including occult distal CBD stone), benign distal biliary stricture/papillary stenosis or sphincter of Oddi dysfunction, and less likely ampullary or pancreatic head malignancy; recommend correlation with bilirubin/LFTs and consideration of MRCP (or ERCP/EUS as clinically indicated). 2. Mildly distended main pancreatic duct within the head of the pancreas, measuring up to 5 mm in diameter; the pancreas is otherwise unremarkable. Differential considerations include physiologic/age-related duct prominence, occult ampullary lesion or pancreatitis, and less likely early pancreatic neoplasm; recommend correlation with clinical/laboratory findings and consideration of MRCP/EUS if symptoms or laboratory abnormalities persist. 3. Simple partially exophytic right renal cyst measuring approximately 1.8 cm (Bosniak I/II); no follow-up imaging recommended. Electronically signed by: Evalene Coho MD 11/09/2024 11:16 AM EST RP Workstation: [id number]      Assessment/Plan Epigastric pain, possible acalculous cholecystitis  84 y/o F with acute onset epigastric pain associated with nausea. History, imaging, labs all reviewed. LFTs and lipase are WNL. WBC 12.1. CT A/P shows gallbladder distention and mild biliary dilation with CBD 9mm and mild pancreatic duct dilation (5mm). MRCP was performed showing gallbladder  wall thickening without gallstones, No CBD stone, and a lightly blunted distal margin of the common bile duct in the ampulla with no definite pancreatic head or ampullary mass. The patients epigastric discomfort, worse at night, worse after certain foods is consistent with symptomatic gallbladder disease. It is unusual that this is occurring in the absence of gallstones. I do suspect that her symptoms are related to gallbladder dysfunction or acalculous cholecystitis. Will discuss with my attending - reasonable to consider HIDA, also reasonable to consider EUS or upper endoscopy given atypical symptoms and biliary dilation, prior to proceeding with cholecystectomy.    I reviewed nursing notes, ED provider notes, last 24 h vitals and pain  scores, last 48 h intake and output, last 24 h labs and trends, and last 24 h imaging results.  Almarie GORMAN Pringle, PA-C [facility] 11/09/2024, 3:28 PM Please see Amion for pager number during day hours 7:00am-4:30pm or 7:00am -11:30am on weekends     [1]  Allergies Allergen Reactions   Tramadol      Other reaction(s): itching   "

## 2024-11-09 NOTE — Assessment & Plan Note (Addendum)
 84 year old presenting to ED with one day  history of sudden on set of epigastric/RUQ pain that radiated to her back with associated nausea, bloating and gas found to have abnormal imaging on MRCP with distended gallbladder with wall thickening, trace pericholecystic fluid and arterial phase enhancement along gallbladder fossa.  -obs to med surg -also has borderline intrahepatic biliary dilatation and mild pancreatic duct dilatation up to .4cm in the pancreatic head.  -? HIDA or EGD -general surgery consulted. Okay with diet, PPI and re eval in AM  -start PPI  -okay with diet -continue zosyn  with leukocytosis  -continue anti-emetics and pain medication PRN  -trend PCT and WBC  -had chest tightness, check EKG and troponin

## 2024-11-09 NOTE — ED Triage Notes (Signed)
 Pt BIB EMS from home due to heartburn and abdominal pain since last night, since morning nauseous. Pt report eating lasagna for dinner when s/s starting. AAOx4.  BP 112/70 HR 80 SpO2 98% RA

## 2024-11-09 NOTE — Assessment & Plan Note (Signed)
 Continue metoprolol.

## 2024-11-09 NOTE — Assessment & Plan Note (Addendum)
 Continue probiotic  Negative for SIBO Work on at least 30-50g of fiber Work on [employer] creon as has noticed no change with therapy over 1.5 months

## 2024-11-09 NOTE — ED Provider Notes (Signed)
 " [place] EMERGENCY DEPARTMENT AT [facility] Provider Note   CSN: [id number] Arrival date & time: 11/09/24  9185     Patient presents with: Abdominal Pain and Heartburn   Andrea Farmer is a 84 y.o. female.   Pt is a 84 yo female with pmhx significant for hld, gerd, IBS, htn, migraines, dm2, breast cancer, and pancreatitis.  She has seen Dr. Burnette and said she has something wrong with her pancreas and was started on Creon, but I can't see his office notes and don't see any recent scans.  Pt said she ate lasagna last night and developed upper abd pain in the middle of the night.  She has nausea as well.  No fever.         Prior to Admission medications  Medication Sig Start Date End Date Taking? Authorizing Provider  acetaminophen  (TYLENOL ) 500 MG tablet Take 1,000 mg by mouth every 6 (six) hours as needed for mild pain (pain score 1-3) or moderate pain (pain score 4-6) (or headaches).   Yes [provider]  amLODipine -benazepril  (LOTREL) 5-40 MG capsule Take 1 capsule by mouth daily. 07/15/22  Yes Hobart Powell FORBES, MD  Aromatic Inhalants (VICKS VAPOR INHALER IN) Place 1 Inhalation into both nostrils as needed (for nasal congestion).   Yes [provider]  aspirin  EC 81 MG tablet Take 1 tablet (81 mg total) by mouth every other day. Swallow whole. Patient taking differently: Take 81 mg by mouth See admin instructions. Take 81 mg by mouth one to two times a week- swallow whole 02/17/21  Yes Hobart Powell FORBES, MD  CREON 36000-114000 units CPEP capsule Take 36,000-72,000 Units by mouth See admin instructions. Take 72,000 units by mouth three times a day before meals and 36,000 units before each snack   Yes [provider]  dapagliflozin  propanediol (FARXIGA ) 10 MG TABS tablet Take 1 tablet (10 mg total) by mouth daily before breakfast. 12/31/22  Yes Pemberton, Powell FORBES, MD  ezetimibe  (ZETIA ) 10 MG tablet Take 1 tablet (10 mg total) by mouth daily.  05/31/23  Yes Vannie Reche RAMAN, NP  letrozole  (FEMARA ) 2.5 MG tablet Take 1 tablet (2.5 mg total) by mouth daily. 05/21/24  Yes Gudena, Vinay, MD  metoprolol  succinate (TOPROL -XL) 25 MG 24 hr tablet Take 25 mg by mouth daily.   Yes [provider]  oxybutynin (DITROPAN-XL) 10 MG 24 hr tablet Take 10 mg by mouth in the morning.   Yes [provider]  polyethylene glycol (MIRALAX  / GLYCOLAX ) 17 g packet Take 17 g by mouth daily. 08/30/18  Yes [provider]  potassium chloride  SA (KLOR-CON  M) 20 MEQ tablet Take 1 tablet (20 mEq total) by mouth daily. 07/28/23  Yes Lonni Slain, MD  Probiotic Product (ALIGN) 4 MG CAPS Take 4 mg by mouth daily.   Yes [provider]  rosuvastatin  (CRESTOR ) 20 MG tablet Take 1 tablet (20 mg total) by mouth daily. 07/15/22  Yes Hobart Powell FORBES, MD  [name] ULTRA test strip daily. for testing blood sugar 11/30/21   [provider]  OneTouch UltraSoft 2 Lancets MISC daily. use as directed 01/27/23   [provider]    Allergies: Tramadol     Review of Systems  Gastrointestinal:  Positive for abdominal pain and nausea.  All other systems reviewed and are negative.   Updated Vital Signs BP (!) 136/91 (BP Location: Right Arm)   Pulse 64   Temp 97.6 F (36.4 C) (Oral)  Resp 18   SpO2 98%   Physical Exam Vitals and nursing note reviewed.  Constitutional:      Appearance: She is well-developed. She is obese.  HENT:     Head: Normocephalic and atraumatic.     Mouth/Throat:     Mouth: Mucous membranes are moist.     Pharynx: Oropharynx is clear.  Eyes:     Extraocular Movements: Extraocular movements intact.     Pupils: Pupils are equal, round, and reactive to light.  Cardiovascular:     Rate and Rhythm: Normal rate and regular rhythm.     Heart sounds: Normal heart sounds.  Pulmonary:     Effort: Pulmonary effort is normal.     Breath sounds: Normal breath sounds.  Abdominal:      Palpations: Abdomen is soft.     Tenderness: There is abdominal tenderness in the right upper quadrant and epigastric area.  Skin:    General: Skin is warm.     Capillary Refill: Capillary refill takes less than 2 seconds.  Neurological:     General: No focal deficit present.     Mental Status: She is alert and oriented to person, place, and time.  Psychiatric:        Mood and Affect: Mood normal.        Behavior: Behavior normal.     (all labs ordered are listed, but only abnormal results are displayed) Labs Reviewed  CBC WITH DIFFERENTIAL/PLATELET - Abnormal; Notable for the following components:      Result Value   WBC 12.1 (*)    Hemoglobin 15.1 (*)    Neutro Abs 10.0 (*)    All other components within normal limits  COMPREHENSIVE METABOLIC PANEL WITH GFR - Abnormal; Notable for the following components:   Glucose, Bld 142 (*)    Calcium  10.4 (*)    Total Protein 8.4 (*)    All other components within normal limits  URINALYSIS, ROUTINE W REFLEX MICROSCOPIC - Abnormal; Notable for the following components:   Color, Urine STRAW (*)    Glucose, UA >=500 (*)    All other components within normal limits  LIPASE, BLOOD  HEMOGLOBIN A1C    EKG: None  Radiology: MR ABDOMEN MRCP W WO CONTRAST Result Date: 11/09/2024 EXAM: MRI Abdomen Without then with Contrast 11/09/2024 02:32:25 PM TECHNIQUE: Multiplanar multisequence MRI of the abdomen was performed without and with the administration of intravenous contrast. 8 mL (gadobutrol  (GADAVIST ) 1 MMOL/ML injection 8 mL GADOBUTROL  1 MMOL/ML IV SOLN) was administered. COMPARISON: CT abdomen 11/09/2024. CLINICAL HISTORY: Distended gallbladder and biliary dilatation. FINDINGS: LIVER: Borderline intrahepatic biliary dilatation. GALLBLADDER AND BILIARY SYSTEM: Gallbladder wall thickening is observed potentially with trace pericholecystic fluid. Motion artifact is present, reducing diagnostic sensitivity and specificity. Artifact. Bile duct 8 mm  in diameter, upper normal size for age and similar to the 05/30/2023 exam. No obvious filling defect is identified in the common bile duct, although assessment is limited by motion artifact. Slightly blunted distal margin of the common bile duct observed in the vicinity of the ampulla, with no abnormal enhancing mass in this vicinity to suggest pancreatic head or ampullary mass. Situated arterial phase enhancement along the gallbladder fossa, which can sometimes be associated with cholecystitis. No well defined gallstones are seen. SPLEEN: Unremarkable. PANCREAS: Dorsal pancreatic duct 4 mm, mildly dilated, but primarily in the pancreatic head, with no duct dilatation in the pancreatic body or tail. ADRENAL GLANDS: Unremarkable. KIDNEYS: Bosniak category 2 cyst of the right kidney upper  pole measuring 1.6 x 2.0 cm with high precontrast T1 signal characteristics and no enhancement. No further imaging workup of this lesion is indicated. LYMPH NODES: No lymphadenopathy. VASCULATURE: Unremarkable. PERITONEUM: No ascites. BOWEL: Grossly unremarkable. ABDOMINAL WALL: No acute abnormality. BONES: Levoconvex thoracolumbar scoliosis with rotary component. Lumbar spondylosis. LUNGS: Atelectasis or scarring in the posterior basal segment left lower lobe. IMPRESSION: 1. Distended gallbladder with wall thickening, trace pericholecystic fluid, and arterial phase enhancement along the gallbladder fossa, which can be associated with cholecystitis, without a well-defined gallstone. 2. Borderline intrahepatic biliary dilatation with common bile duct measuring 8 mm (upper normal for age) and no obvious common bile duct filling defect, with slightly blunted distal common bile duct margin. 3. Mild pancreatic duct dilatation up to 0.4 cm in the pancreatic head , although normal caliber in the pancreatic body and tail. No underlying pancreatic lesion observed. 4. Atelectasis or scarring in the posterior basal segment of the left lower  lobe. 5. Levoconvex thoracolumbar scoliosis with a rotary component. 6. Lumbar spondylosis. Electronically signed by: Ryan Salvage MD 11/09/2024 02:56 PM EST RP Workstation: [id number]   MR 3D Recon At Scanner Result Date: 11/09/2024 EXAM: MRI Abdomen Without then with Contrast 11/09/2024 02:32:25 PM TECHNIQUE: Multiplanar multisequence MRI of the abdomen was performed without and with the administration of intravenous contrast. 8 mL (gadobutrol  (GADAVIST ) 1 MMOL/ML injection 8 mL GADOBUTROL  1 MMOL/ML IV SOLN) was administered. COMPARISON: CT abdomen 11/09/2024. CLINICAL HISTORY: Distended gallbladder and biliary dilatation. FINDINGS: LIVER: Borderline intrahepatic biliary dilatation. GALLBLADDER AND BILIARY SYSTEM: Gallbladder wall thickening is observed potentially with trace pericholecystic fluid. Motion artifact is present, reducing diagnostic sensitivity and specificity. Artifact. Bile duct 8 mm in diameter, upper normal size for age and similar to the 05/30/2023 exam. No obvious filling defect is identified in the common bile duct, although assessment is limited by motion artifact. Slightly blunted distal margin of the common bile duct observed in the vicinity of the ampulla, with no abnormal enhancing mass in this vicinity to suggest pancreatic head or ampullary mass. Situated arterial phase enhancement along the gallbladder fossa, which can sometimes be associated with cholecystitis. No well defined gallstones are seen. SPLEEN: Unremarkable. PANCREAS: Dorsal pancreatic duct 4 mm, mildly dilated, but primarily in the pancreatic head, with no duct dilatation in the pancreatic body or tail. ADRENAL GLANDS: Unremarkable. KIDNEYS: Bosniak category 2 cyst of the right kidney upper pole measuring 1.6 x 2.0 cm with high precontrast T1 signal characteristics and no enhancement. No further imaging workup of this lesion is indicated. LYMPH NODES: No lymphadenopathy. VASCULATURE: Unremarkable. PERITONEUM: No  ascites. BOWEL: Grossly unremarkable. ABDOMINAL WALL: No acute abnormality. BONES: Levoconvex thoracolumbar scoliosis with rotary component. Lumbar spondylosis. LUNGS: Atelectasis or scarring in the posterior basal segment left lower lobe. IMPRESSION: 1. Distended gallbladder with wall thickening, trace pericholecystic fluid, and arterial phase enhancement along the gallbladder fossa, which can be associated with cholecystitis, without a well-defined gallstone. 2. Borderline intrahepatic biliary dilatation with common bile duct measuring 8 mm (upper normal for age) and no obvious common bile duct filling defect, with slightly blunted distal common bile duct margin. 3. Mild pancreatic duct dilatation up to 0.4 cm in the pancreatic head , although normal caliber in the pancreatic body and tail. No underlying pancreatic lesion observed. 4. Atelectasis or scarring in the posterior basal segment of the left lower lobe. 5. Levoconvex thoracolumbar scoliosis with a rotary component. 6. Lumbar spondylosis. Electronically signed by: Ryan Salvage MD 11/09/2024 02:56 PM EST RP Workstation: [id number]  CT ABDOMEN PELVIS W CONTRAST Result Date: 11/09/2024 EXAM: CT ABDOMEN AND PELVIS WITH CONTRAST 11/09/2024 10:55:09 AM TECHNIQUE: CT of the abdomen and pelvis was performed with the administration of 100 mL (iohexol  (OMNIPAQUE ) 300 MG/ML solution 100 mL IOHEXOL  300 MG/ML SOLN) intravenous contrast. Multiplanar reformatted images are provided for review. Automated exposure control, iterative reconstruction, and/or weight-based adjustment of the mA/kV was utilized to reduce the radiation dose to as low as reasonably achievable. COMPARISON: CT of the abdomen and pelvis dated 05/30/2023. CLINICAL HISTORY: Abdominal pain, acute, nonlocalized. Acute, nonlocalized abdominal pain. FINDINGS: LOWER CHEST: There is mild atelectasis present independently within the lower lobes bilaterally. LIVER: The liver is unremarkable.  GALLBLADDER AND BILE DUCTS: The gallbladder is mildly distended, measuring up to 4.6 cm in diameter. There is mild intrahepatic and extrahepatic biliary ductal dilatation. The common bile duct measures up to approximately 9 mm in diameter. There are no obstructing lesions evident. SPLEEN: The spleen is unremarkable. PANCREAS: The main pancreatic duct is mildly distended within the head of the pancreas, measuring up to 5 mm in diameter. The pancreas is otherwise unremarkable. ADRENAL GLANDS: The adrenal glands are unremarkable. KIDNEYS, URETERS AND BLADDER: There is a simple partially exophytic cyst arising laterally from the superior pole of the right kidney measuring approximately 18 mm in diameter. Per consensus, no follow-up is needed for simple Bosniak type 1 and 2 renal cysts, unless the patient has a malignancy history or risk factors. No stones in the kidneys or ureters. No hydronephrosis. No perinephric or periureteral stranding. Urinary bladder is unremarkable. GI AND BOWEL: Stomach demonstrates no acute abnormality. There is no bowel obstruction. PERITONEUM AND RETROPERITONEUM: No ascites. No free air. VASCULATURE: The abdominal aorta is normal in caliber and demonstrates moderate calcific atheromatous disease. LYMPH NODES: No lymphadenopathy. REPRODUCTIVE ORGANS: The patient is status post hysterectomy and bilateral salpingo-oophorectomy. BONES AND SOFT TISSUES: Orthopedic fixation of the left proximal femur. No acute osseous abnormality. No focal soft tissue abnormality. IMPRESSION: 1. Mildly distended gallbladder and mild intrahepatic and extrahepatic biliary ductal dilatation with the common bile duct measuring up to approximately 9 mm in diameter, without an obstructing lesion evident. Differential considerations include choledocholithiasis (including occult distal CBD stone), benign distal biliary stricture/papillary stenosis or sphincter of Oddi dysfunction, and less likely ampullary or pancreatic  head malignancy; recommend correlation with bilirubin/LFTs and consideration of MRCP (or ERCP/EUS as clinically indicated). 2. Mildly distended main pancreatic duct within the head of the pancreas, measuring up to 5 mm in diameter; the pancreas is otherwise unremarkable. Differential considerations include physiologic/age-related duct prominence, occult ampullary lesion or pancreatitis, and less likely early pancreatic neoplasm; recommend correlation with clinical/laboratory findings and consideration of MRCP/EUS if symptoms or laboratory abnormalities persist. 3. Simple partially exophytic right renal cyst measuring approximately 1.8 cm (Bosniak I/II); no follow-up imaging recommended. Electronically signed by: Evalene Coho MD 11/09/2024 11:16 AM EST RP Workstation: [id number]     Procedures   Medications Ordered in the ED  piperacillin -tazobactam (ZOSYN ) IVPB 3.375 g (has no administration in time range)  insulin  aspart (novoLOG ) injection 0-9 Units (has no administration in time range)  pantoprazole  (PROTONIX ) injection 40 mg (has no administration in time range)  piperacillin -tazobactam (ZOSYN ) IVPB 3.375 g (has no administration in time range)  sodium chloride  0.9 % bolus 1,000 mL (1,000 mLs Intravenous New Bag/Given 11/09/24 0934)  morphine  (PF) 4 MG/ML injection 4 mg (4 mg Intravenous Given 11/09/24 0932)  ondansetron  (ZOFRAN ) injection 4 mg (4 mg Intravenous Given 11/09/24 0931)  iohexol  (OMNIPAQUE )  300 MG/ML solution 100 mL (100 mLs Intravenous Contrast Given 11/09/24 1039)  ondansetron  (ZOFRAN ) injection 4 mg (4 mg Intravenous Given 11/09/24 1234)  gadobutrol  (GADAVIST ) 1 MMOL/ML injection 8 mL (8 mLs Intravenous Contrast Given 11/09/24 1428)                                    Medical Decision Making Amount and/or Complexity of Data Reviewed Labs: ordered. Radiology: ordered.  Risk Prescription drug management. Decision regarding hospitalization.   This patient presents to the  ED for concern of abd pain, this involves an extensive number of treatment options, and is a complaint that carries with it a high risk of complications and morbidity.  The differential diagnosis includes pancreatitis, cholecystitis, cholelithiasis, gerd, gastritis   Co morbidities that complicate the patient evaluation  hld, gerd, IBS, htn, migraines, dm2, breast cancer, and pancreatitis   Additional history obtained:  Additional history obtained from epic chart review External records from outside source obtained and reviewed including EMS report   Lab Tests:  I Ordered, and personally interpreted labs.  The pertinent results include:  cbc with wbc sl elevated at 12.1, cmp neg other than glucose sl elevated at 142, lip nl, ua nl   Imaging Studies ordered:  I ordered imaging studies including ct abd/pelvis and MRCP I independently visualized and interpreted imaging which showed  CT abd/pelvis: Mildly distended gallbladder and mild intrahepatic and extrahepatic biliary  ductal dilatation with the common bile duct measuring up to approximately 9 mm  in diameter, without an obstructing lesion evident. Differential considerations  include choledocholithiasis (including occult distal CBD stone), benign distal  biliary stricture/papillary stenosis or sphincter of Oddi dysfunction, and less  likely ampullary or pancreatic head malignancy; recommend correlation with  bilirubin/LFTs and consideration of MRCP (or ERCP/EUS as clinically indicated).  2. Mildly distended main pancreatic duct within the head of the pancreas,  measuring up to 5 mm in diameter; the pancreas is otherwise unremarkable.  Differential considerations include physiologic/age-related duct prominence,  occult ampullary lesion or pancreatitis, and less likely early pancreatic  neoplasm; recommend correlation with clinical/laboratory findings and  consideration of MRCP/EUS if symptoms or laboratory abnormalities persist.   3. Simple partially exophytic right renal cyst measuring approximately 1.8 cm  (Bosniak I/II); no follow-up imaging recommended.  MRCP: 1. Distended gallbladder with wall thickening, trace pericholecystic fluid, and  arterial phase enhancement along the gallbladder fossa, which can be associated  with cholecystitis, without a well-defined gallstone.  2. Borderline intrahepatic biliary dilatation with common bile duct measuring 8  mm (upper normal for age) and no obvious common bile duct filling defect, with  slightly blunted distal common bile duct margin.  3. Mild pancreatic duct dilatation up to 0.4 cm in the pancreatic head ,  although normal caliber in the pancreatic body and tail. No underlying  pancreatic lesion observed.  4. Atelectasis or scarring in the posterior basal segment of the left lower  lobe.  5. Levoconvex thoracolumbar scoliosis with a rotary component.  6. Lumbar spondylosis.   I agree with the radiologist interpretation   Cardiac Monitoring:  The patient was maintained on a cardiac monitor.  I personally viewed and interpreted the cardiac monitored which showed an underlying rhythm of: nsr   Medicines ordered and prescription drug management:  I ordered medication including morphine , zofran , zosyn   for sx  Reevaluation of the patient after these medicines showed that the patient  improved I have reviewed the patients home medicines and have made adjustments as needed   Test Considered:  Ct/mrcp   Critical Interventions:  Pain control   Consultations Obtained:  I requested consultation with the surgeons,  and discussed lab and imaging findings as well as pertinent plan - they did see pt in consult and request medicine admission for further work up. Pt d/w Dr. Waddell (triad) for admission.   Problem List / ED Course:  Acute cholecystitis:  pt started on Zosyn .  Surgery has been consulted.  They would like further work up prior to surgery with  HIDA, GI.    Reevaluation:  After the interventions noted above, I reevaluated the patient and found that they have :improved   Social Determinants of Health:  Lives at home   Dispostion:  After consideration of the diagnostic results and the patients response to treatment, I feel that the patent would benefit from admission.       Final diagnoses:  Cholecystitis    ED Discharge Orders     None          Dean Clarity, MD 11/09/24 1725  "

## 2024-11-09 NOTE — Assessment & Plan Note (Addendum)
 Continue lipid lowering therapy and ASA  Zetia , crestor   (Has not been taking aspirin  on regular basis)

## 2024-11-09 NOTE — Assessment & Plan Note (Signed)
 A1C of 6.7 in 2024  Repeat today with possible surgery  Hold farxiga  in light of possible surgery SSI and accuchecks QAC/HS

## 2024-11-09 NOTE — Assessment & Plan Note (Signed)
 S/p radiation only in right breast in 2021 Continue letrozole  daily

## 2024-11-09 NOTE — Plan of Care (Signed)
 ?  Problem: Clinical Measurements: ?Goal: Will remain free from infection ?Outcome: Progressing ?  ?

## 2024-11-09 NOTE — H&P (Addendum)
 " History and Physical    Patient: Andrea Farmer FMW:[id number] DOB: 12-23-1940 DOA: 11/09/2024 DOS: the patient was seen and examined on 11/09/2024 PCP: Okey Carlin Redbird, MD  Patient coming from: Home - lives with her partner. Uses a cane outside.    Chief Complaint: epigastric pain   HPI: Andrea Farmer is a 84 y.o. 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 She woke up around 4AM and was hurting from her chin down to her chest. She almost felt like she was having a heart attack. She states her stomach was tight and roaring. She also had quite a bit of gas. Pain was epigastric and moved to both upper quadrants. She states she had some radiation of the pain to her back. She also got nauseated, but no vomiting. She did have warm water  that came up in her mouth. She states the pain didn't subside and came to ED. She states pain is better, but still doesn't feel right.   She denies any fever/chills, no blood in her stool. Hx of EGD around 12 years ago, she states it was normal, done by Dr. Burnette. She also saw Dr. Burnette back in December who put her on Creon and she hasn't noticed any difference on this. No stool studies done. She was tested for SIBO and it was negative.   Denies any fever/chills, vision changes/headaches, palpitations, shortness of breath or cough, diarrhea,  dysuria or leg swelling. Bowel movements are normal.   She does use NSAIDs. No international travel.   She does not smoke or drink alcohol.   ER Course:  vitals: afebrile, bp: 138/75, HR: 59, RR: 18, oxygen: 100%RA Pertinent labs: wbc: 12.1, hgb: 15.1,  Ct abdomen/pelvis: Mildly distended gallbladder and mild intrahepatic and extrahepatic biliary ductal dilatation with the common bile duct measuring up to approximately 9 mm in diameter, without  an obstructing lesion evident. Differential considerations include choledocholithiasis (including occult distal CBD stone), benign distal biliary stricture/papillary stenosis or sphincter of Oddi dysfunction, and less likely ampullary or pancreatic head malignancy; recommend correlation with bilirubin/LFTs and consideration of MRCP (or ERCP/EUS as clinically indicated). 2. Mildly distended main pancreatic duct within the head of the pancreas, measuring up to 5 mm in diameter; the pancreas is otherwise unremarkable. Differential considerations include physiologic/age-related duct prominence, occult ampullary lesion or pancreatitis, and less likely early pancreatic neoplasm; recommend correlation with clinical/laboratory findings and consideration of MRCP/EUS if symptoms or laboratory abnormalities persist. 3. Simple partially exophytic right renal cyst measuring approximately 1.8 cm (Bosniak I/II); no follow-up imaging recommended. MRCP: Distended gallbladder with wall thickening, trace pericholecystic fluid, and arterial phase enhancement along the gallbladder fossa, which can be associated with cholecystitis, without a well-defined gallstone. 2. Borderline intrahepatic biliary dilatation with common bile duct measuring 8 mm (upper normal for age) and no obvious common bile duct filling defect, with slightly blunted distal common bile duct margin. 3. Mild pancreatic duct dilatation up to 0.4 cm in the pancreatic head , although normal caliber in the pancreatic body and tail. No underlying pancreatic lesion observed. 4. Atelectasis or scarring in the posterior basal segment of the left lower lobe. 5. Levoconvex thoracolumbar scoliosis with a rotary component. 6. Lumbar spondylosis. In ED: general surgery consulted. Given 1L IVF bolus, started on zosyn  and given anti emetics and pain medication. TRH  asked to admit.     Review of Systems: As mentioned in the history of present illness. All  other systems reviewed and are negative. Past Medical History:  Diagnosis Date   Acid reflux    Back pain    arthritis   Breast cancer (HCC) 02/25/2020   right breast IDC   Cataract    immature but doesn't know which eye   Diabetes mellitus without complication (HCC)    High cholesterol    takes Crestor  daily   History of colon polyps    benign   History of migraine    Hypertension    takes Lotrel and HCTZ dailydaily   Irritable bowel syndrome 03/11/2016   Joint pain    Joint swelling    Osteoarthritis of knee 03/11/2016   Personal history of radiation therapy 2021   IDC   Pneumonia    30 plus yrs ago   Past Surgical History:  Procedure Laterality Date   ABDOMINAL HYSTERECTOMY     BREAST BIOPSY Right 05/14/2015   BREAST EXCISIONAL BIOPSY Left    BREAST EXCISIONAL BIOPSY Right 2016   BREAST LUMPECTOMY Right 02/25/2020   IDC   BREAST LUMPECTOMY WITH RADIOACTIVE SEED AND SENTINEL LYMPH NODE BIOPSY Right 03/20/2020   Procedure: RIGHT BREAST LUMPECTOMY WITH RADIOACTIVE SEED AND SENTINEL LYMPH NODE BIOPSY;  Surgeon: Curvin Deward MOULD, MD;  Location: [facility];  Service: General;  Laterality: Right;   BREAST LUMPECTOMY WITH RADIOACTIVE SEED LOCALIZATION Right 04/27/2019   Procedure: RIGHT BREAST LUMPECTOMY WITH RADIOACTIVE SEED LOCALIZATION;  Surgeon: Curvin Deward MOULD, MD;  Location:  SURGERY CENTER;  Service: General;  Laterality: Right;   EXCISION OF BREAST BIOPSY Right 04/27/2019   complex sclerosing lesion   HAND SURGERY Right 2025   INTRAMEDULLARY (IM) NAIL INTERTROCHANTERIC Left 11/26/2020   Procedure: INTRAMEDULLARY (IM) NAIL INTERTROCHANTRIC;  Surgeon: Ernie Cough, MD;  Location: [facility];  Service: Orthopedics;  Laterality: Left;   RECTAL SURGERY     x 3   REPLACEMENT TOTAL KNEE BILATERAL     SYNOVECTOMY Left 07/21/2015   Procedure: LEFT KNEE SYNOVECTOMY with POLY EXCHANGE;  Surgeon: Marcey Raman, MD;  Location: [facility];  Service: Orthopedics;   Laterality: Left;   TOTAL KNEE REVISION Right 07/05/2016   Procedure: TOTAL KNEE REVISION WITH SCAR DEBRIDEMENT/PATELLA REVISION WITH POLY EXCHANGE;  Surgeon: Marcey Raman, MD;  Location: [facility];  Service: Orthopedics;  Laterality: Right;   Social History:  reports that she has quit smoking. Her smoking use included cigarettes. She has never used smokeless tobacco. She reports current alcohol use. She reports that she does not use drugs.  Allergies[1]  Family History  Problem Relation Age of Onset   Cancer Mother    Aneurysm Father    BRCA 1/2 Neg Hx    Breast cancer Neg Hx     Prior to Admission medications  Medication Sig Start Date End Date Taking? Authorizing Provider  acetaminophen  (TYLENOL ) 500 MG tablet Take 1,000 mg by mouth every 6 (six) hours as needed for moderate pain.    [provider]  amLODipine -benazepril  (LOTREL) 5-40 MG capsule Take 1 capsule by mouth daily. 07/15/22   Hobart Powell BRAVO, MD  aspirin  EC 81 MG tablet Take 1 tablet (81 mg total) by mouth every other day. Swallow whole. 02/17/21   Hobart Powell BRAVO, MD  dapagliflozin  propanediol (FARXIGA ) 10 MG TABS tablet Take 1 tablet (10 mg total) by mouth daily before breakfast. 12/31/22   Hobart Powell BRAVO, MD  ezetimibe  (ZETIA ) 10 MG tablet Take 1 tablet (10 mg total) by mouth daily. 05/31/23   Vannie Reche RAMAN, NP  letrozole  (FEMARA ) 2.5 MG tablet Take 1 tablet (2.5 mg total) by mouth daily. 05/21/24   Gudena, Vinay, MD  metoprolol  succinate (TOPROL -XL) 25 MG 24 hr tablet Take 25 mg by mouth daily.    [provider]  Multiple Vitamin (MULTIVITAMIN WITH MINERALS) TABS tablet Take 1 tablet by mouth 3 (three) times a week.     [provider]  [facility] ULTRA test strip daily. for testing blood sugar 11/30/21   [provider]  OneTouch UltraSoft 2 Lancets MISC daily. use as directed 01/27/23   [provider]  polyethylene glycol (MIRALAX  / GLYCOLAX ) 17 g packet Take 17 g  by mouth daily. 08/30/18   [provider]  potassium chloride  SA (KLOR-CON  M) 20 MEQ tablet Take 1 tablet (20 mEq total) by mouth daily. 07/28/23   Lonni Slain, MD  Probiotic Product (ALIGN) 4 MG CAPS Take 4 mg by mouth daily.    [provider]  rosuvastatin  (CRESTOR ) 20 MG tablet Take 1 tablet (20 mg total) by mouth daily. 07/15/22   Hobart Powell BRAVO, MD    Physical Exam: Vitals:   11/09/24 0820 11/09/24 1112 11/09/24 1523  BP: (!) 159/85 (!) 141/80 (!) 136/91  Pulse: 60 60 64  Resp: 18 16 18   Temp: (!) 97.5 F (36.4 C) 97.6 F (36.4 C) 97.6 F (36.4 C)  TempSrc:   Oral  SpO2: 97% 100% 98%   General:  Appears calm and comfortable and is in NAD Eyes:  PERRL, EOMI, normal lids, iris ENT:  grossly normal hearing, lips & tongue, mmm; appropriate dentition Neck:  no LAD, masses or thyromegaly; no carotid bruits Cardiovascular:  RRR, +systolic murmur.  No LE edema.  Respiratory:   CTA bilaterally with no wheezes/rales/rhonchi.  Normal respiratory effort. Abdomen:  soft, TTP in RUQ, but negative murphy's sign. BS +  Back:   normal alignment, no CVAT Skin:  no rash or induration seen on limited exam Musculoskeletal:  grossly normal tone BUE/BLE, good ROM, no bony abnormality Lower extremity:  No LE edema.  Limited foot exam with no ulcerations.  2+ distal pulses. Psychiatric:  grossly normal mood and affect, speech fluent and appropriate, AOx3 Neurologic:  CN 2-12 grossly intact, moves all extremities in coordinated fashion, sensation intact   Radiological Exams on Admission: Independently reviewed - see discussion in A/P where applicable  MR ABDOMEN MRCP W WO CONTRAST Result Date: 11/09/2024 EXAM: MRI Abdomen Without then with Contrast 11/09/2024 02:32:25 PM TECHNIQUE: Multiplanar multisequence MRI of the abdomen was performed without and with the administration of intravenous contrast. 8 mL (gadobutrol  (GADAVIST ) 1 MMOL/ML injection 8 mL GADOBUTROL  1  MMOL/ML IV SOLN) was administered. COMPARISON: CT abdomen 11/09/2024. CLINICAL HISTORY: Distended gallbladder and biliary dilatation. FINDINGS: LIVER: Borderline intrahepatic biliary dilatation. GALLBLADDER AND BILIARY SYSTEM: Gallbladder wall thickening is observed potentially with trace pericholecystic fluid. Motion artifact is present, reducing diagnostic sensitivity and specificity. Artifact. Bile duct 8 mm in diameter, upper normal size for age and similar to the 05/30/2023 exam. No obvious filling defect is identified in the common bile duct, although assessment is limited by motion artifact. Slightly blunted distal margin of the common bile duct observed in the vicinity of the ampulla, with no abnormal enhancing mass in this vicinity to suggest pancreatic head or ampullary mass. Situated arterial phase enhancement along the gallbladder fossa, which can sometimes be associated with  cholecystitis. No well defined gallstones are seen. SPLEEN: Unremarkable. PANCREAS: Dorsal pancreatic duct 4 mm, mildly dilated, but primarily in the pancreatic head, with no duct dilatation in the pancreatic body or tail. ADRENAL GLANDS: Unremarkable. KIDNEYS: Bosniak category 2 cyst of the right kidney upper pole measuring 1.6 x 2.0 cm with high precontrast T1 signal characteristics and no enhancement. No further imaging workup of this lesion is indicated. LYMPH NODES: No lymphadenopathy. VASCULATURE: Unremarkable. PERITONEUM: No ascites. BOWEL: Grossly unremarkable. ABDOMINAL WALL: No acute abnormality. BONES: Levoconvex thoracolumbar scoliosis with rotary component. Lumbar spondylosis. LUNGS: Atelectasis or scarring in the posterior basal segment left lower lobe. IMPRESSION: 1. Distended gallbladder with wall thickening, trace pericholecystic fluid, and arterial phase enhancement along the gallbladder fossa, which can be associated with cholecystitis, without a well-defined gallstone. 2. Borderline intrahepatic biliary  dilatation with common bile duct measuring 8 mm (upper normal for age) and no obvious common bile duct filling defect, with slightly blunted distal common bile duct margin. 3. Mild pancreatic duct dilatation up to 0.4 cm in the pancreatic head , although normal caliber in the pancreatic body and tail. No underlying pancreatic lesion observed. 4. Atelectasis or scarring in the posterior basal segment of the left lower lobe. 5. Levoconvex thoracolumbar scoliosis with a rotary component. 6. Lumbar spondylosis. Electronically signed by: Ryan Salvage MD 11/09/2024 02:56 PM EST RP Workstation: [id number]   MR 3D Recon At Scanner Result Date: 11/09/2024 EXAM: MRI Abdomen Without then with Contrast 11/09/2024 02:32:25 PM TECHNIQUE: Multiplanar multisequence MRI of the abdomen was performed without and with the administration of intravenous contrast. 8 mL (gadobutrol  (GADAVIST ) 1 MMOL/ML injection 8 mL GADOBUTROL  1 MMOL/ML IV SOLN) was administered. COMPARISON: CT abdomen 11/09/2024. CLINICAL HISTORY: Distended gallbladder and biliary dilatation. FINDINGS: LIVER: Borderline intrahepatic biliary dilatation. GALLBLADDER AND BILIARY SYSTEM: Gallbladder wall thickening is observed potentially with trace pericholecystic fluid. Motion artifact is present, reducing diagnostic sensitivity and specificity. Artifact. Bile duct 8 mm in diameter, upper normal size for age and similar to the 05/30/2023 exam. No obvious filling defect is identified in the common bile duct, although assessment is limited by motion artifact. Slightly blunted distal margin of the common bile duct observed in the vicinity of the ampulla, with no abnormal enhancing mass in this vicinity to suggest pancreatic head or ampullary mass. Situated arterial phase enhancement along the gallbladder fossa, which can sometimes be associated with cholecystitis. No well defined gallstones are seen. SPLEEN: Unremarkable. PANCREAS: Dorsal pancreatic duct 4 mm, mildly  dilated, but primarily in the pancreatic head, with no duct dilatation in the pancreatic body or tail. ADRENAL GLANDS: Unremarkable. KIDNEYS: Bosniak category 2 cyst of the right kidney upper pole measuring 1.6 x 2.0 cm with high precontrast T1 signal characteristics and no enhancement. No further imaging workup of this lesion is indicated. LYMPH NODES: No lymphadenopathy. VASCULATURE: Unremarkable. PERITONEUM: No ascites. BOWEL: Grossly unremarkable. ABDOMINAL WALL: No acute abnormality. BONES: Levoconvex thoracolumbar scoliosis with rotary component. Lumbar spondylosis. LUNGS: Atelectasis or scarring in the posterior basal segment left lower lobe. IMPRESSION: 1. Distended gallbladder with wall thickening, trace pericholecystic fluid, and arterial phase enhancement along the gallbladder fossa, which can be associated with cholecystitis, without a well-defined gallstone. 2. Borderline intrahepatic biliary dilatation with common bile duct measuring 8 mm (upper normal for age) and no obvious common bile duct filling defect, with slightly blunted distal common bile duct margin. 3. Mild pancreatic duct dilatation up to 0.4 cm in the pancreatic head , although normal caliber in the pancreatic body and  tail. No underlying pancreatic lesion observed. 4. Atelectasis or scarring in the posterior basal segment of the left lower lobe. 5. Levoconvex thoracolumbar scoliosis with a rotary component. 6. Lumbar spondylosis. Electronically signed by: Ryan Salvage MD 11/09/2024 02:56 PM EST RP Workstation: [id number]   CT ABDOMEN PELVIS W CONTRAST Result Date: 11/09/2024 EXAM: CT ABDOMEN AND PELVIS WITH CONTRAST 11/09/2024 10:55:09 AM TECHNIQUE: CT of the abdomen and pelvis was performed with the administration of 100 mL (iohexol  (OMNIPAQUE ) 300 MG/ML solution 100 mL IOHEXOL  300 MG/ML SOLN) intravenous contrast. Multiplanar reformatted images are provided for review. Automated exposure control, iterative reconstruction,  and/or weight-based adjustment of the mA/kV was utilized to reduce the radiation dose to as low as reasonably achievable. COMPARISON: CT of the abdomen and pelvis dated 05/30/2023. CLINICAL HISTORY: Abdominal pain, acute, nonlocalized. Acute, nonlocalized abdominal pain. FINDINGS: LOWER CHEST: There is mild atelectasis present independently within the lower lobes bilaterally. LIVER: The liver is unremarkable. GALLBLADDER AND BILE DUCTS: The gallbladder is mildly distended, measuring up to 4.6 cm in diameter. There is mild intrahepatic and extrahepatic biliary ductal dilatation. The common bile duct measures up to approximately 9 mm in diameter. There are no obstructing lesions evident. SPLEEN: The spleen is unremarkable. PANCREAS: The main pancreatic duct is mildly distended within the head of the pancreas, measuring up to 5 mm in diameter. The pancreas is otherwise unremarkable. ADRENAL GLANDS: The adrenal glands are unremarkable. KIDNEYS, URETERS AND BLADDER: There is a simple partially exophytic cyst arising laterally from the superior pole of the right kidney measuring approximately 18 mm in diameter. Per consensus, no follow-up is needed for simple Bosniak type 1 and 2 renal cysts, unless the patient has a malignancy history or risk factors. No stones in the kidneys or ureters. No hydronephrosis. No perinephric or periureteral stranding. Urinary bladder is unremarkable. GI AND BOWEL: Stomach demonstrates no acute abnormality. There is no bowel obstruction. PERITONEUM AND RETROPERITONEUM: No ascites. No free air. VASCULATURE: The abdominal aorta is normal in caliber and demonstrates moderate calcific atheromatous disease. LYMPH NODES: No lymphadenopathy. REPRODUCTIVE ORGANS: The patient is status post hysterectomy and bilateral salpingo-oophorectomy. BONES AND SOFT TISSUES: Orthopedic fixation of the left proximal femur. No acute osseous abnormality. No focal soft tissue abnormality. IMPRESSION: 1. Mildly  distended gallbladder and mild intrahepatic and extrahepatic biliary ductal dilatation with the common bile duct measuring up to approximately 9 mm in diameter, without an obstructing lesion evident. Differential considerations include choledocholithiasis (including occult distal CBD stone), benign distal biliary stricture/papillary stenosis or sphincter of Oddi dysfunction, and less likely ampullary or pancreatic head malignancy; recommend correlation with bilirubin/LFTs and consideration of MRCP (or ERCP/EUS as clinically indicated). 2. Mildly distended main pancreatic duct within the head of the pancreas, measuring up to 5 mm in diameter; the pancreas is otherwise unremarkable. Differential considerations include physiologic/age-related duct prominence, occult ampullary lesion or pancreatitis, and less likely early pancreatic neoplasm; recommend correlation with clinical/laboratory findings and consideration of MRCP/EUS if symptoms or laboratory abnormalities persist. 3. Simple partially exophytic right renal cyst measuring approximately 1.8 cm (Bosniak I/II); no follow-up imaging recommended. Electronically signed by: Evalene Coho MD 11/09/2024 11:16 AM EST RP Workstation: [id number]    EKG: pending.    Labs on Admission: I have personally reviewed the available labs and imaging studies at the time of the admission.  Pertinent labs:   wbc: 12.1,  hgb: 15.1,  Assessment and Plan: Principal Problem:   RUQ/epigastric pain r/o acalculous cholecystitis Active Problems:   Diabetes mellitus without complication (HCC)  Essential (primary) hypertension   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   PVC (premature ventricular contraction)   Irritable bowel syndrome   Malignant neoplasm of lower-outer quadrant of right breast of female, estrogen receptor positive (HCC)   Hypercalcemia    Assessment and Plan: * RUQ/epigastric pain r/o acalculous cholecystitis 84 year  old presenting to ED with one day  history of sudden on set of epigastric/RUQ pain that radiated to her back with associated nausea, bloating and gas found to have abnormal imaging on MRCP with distended gallbladder with wall thickening, trace pericholecystic fluid and arterial phase enhancement along gallbladder fossa.  -obs to med surg -also has borderline intrahepatic biliary dilatation and mild pancreatic duct dilatation up to .4cm in the pancreatic head.  -? HIDA or EGD -general surgery consulted. Okay with diet, PPI and re eval in AM  -start PPI  -okay with diet -continue zosyn  with leukocytosis  -continue anti-emetics and pain medication PRN  -trend PCT and WBC  -had chest tightness, check EKG and troponin   Diabetes mellitus without complication (HCC) A1C of 6.7 in 2024  Repeat today with possible surgery  Hold farxiga  in light of possible surgery SSI and accuchecks QAC/HS    Essential (primary) hypertension Continue amlodipine  5mg , benzapril 40mg  and metoprolol  25mg  daily   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 Continue lipid lowering therapy and ASA  Zetia , crestor   (Has not been taking aspirin  on regular basis)   PVC (premature ventricular contraction) Continue metoprolol    Irritable bowel syndrome Continue probiotic  Negative for SIBO Work on at least 30-50g of fiber Work on [employer] creon as has noticed no change with therapy over 1.5 months   Malignant neoplasm of lower-outer quadrant of right breast of female, estrogen receptor positive (HCC) S/p radiation only in right breast in 2021 Continue letrozole  daily   Hypercalcemia Repeat in AM    Advance Care Planning:   Code Status: Full Code    Consultants: General surgery    Procedures: MCRP 11/09/24    Antibiotics: Zosyn  11/09/2024     DVT Prophylaxis: SCDs  Family Communication: none   Severity of Illness: The appropriate patient status for this patient  is OBSERVATION. Observation status is judged to be reasonable and necessary in order to provide the required intensity of service to ensure the patient's safety. The patient's presenting symptoms, physical exam findings, and initial radiographic and laboratory data in the context of their medical condition is felt to place them at decreased risk for further clinical deterioration. Furthermore, it is anticipated that the patient will be medically stable for discharge from the hospital within 2 midnights of admission.   Author: Isaiah Geralds, MD 11/09/2024 6:06 PM  For on call review [email or web address].      [1]  Allergies Allergen Reactions   Tramadol  Itching   "

## 2024-11-10 ENCOUNTER — Other Ambulatory Visit (HOSPITAL_COMMUNITY): Payer: Self-pay

## 2024-11-10 LAB — GLUCOSE, CAPILLARY
Glucose-Capillary: 111 mg/dL — ABNORMAL HIGH (ref 70–99)
Glucose-Capillary: 92 mg/dL (ref 70–99)

## 2024-11-10 LAB — COMPREHENSIVE METABOLIC PANEL WITH GFR
ALT: 23 U/L (ref 0–44)
AST: 31 U/L (ref 15–41)
Albumin: 4.1 g/dL (ref 3.5–5.0)
Alkaline Phosphatase: 88 U/L (ref 38–126)
Anion gap: 11 (ref 5–15)
BUN: 10 mg/dL (ref 8–23)
CO2: 25 mmol/L (ref 22–32)
Calcium: 9.4 mg/dL (ref 8.9–10.3)
Chloride: 103 mmol/L (ref 98–111)
Creatinine, Ser: 0.73 mg/dL (ref 0.44–1.00)
GFR, Estimated: >60 mL/min
Glucose, Bld: 99 mg/dL (ref 70–99)
Potassium: 3.7 mmol/L (ref 3.5–5.1)
Sodium: 139 mmol/L (ref 135–145)
Total Bilirubin: 0.8 mg/dL (ref 0.0–1.2)
Total Protein: 7 g/dL (ref 6.5–8.1)

## 2024-11-10 LAB — HEMOGLOBIN A1C
Hgb A1c MFr Bld: 6.2 % — ABNORMAL HIGH (ref 4.8–5.6)
Mean Plasma Glucose: 131.24 mg/dL

## 2024-11-10 LAB — PROCALCITONIN: Procalcitonin: <0.1 ng/mL

## 2024-11-10 LAB — CBC
HCT: 41.2 % (ref 36.0–46.0)
Hemoglobin: 13 g/dL (ref 12.0–15.0)
MCH: 30.2 pg (ref 26.0–34.0)
MCHC: 31.6 g/dL (ref 30.0–36.0)
MCV: 95.6 fL (ref 80.0–100.0)
Platelets: 286 10*3/uL (ref 150–400)
RBC: 4.31 MIL/uL (ref 3.87–5.11)
RDW: 13.2 % (ref 11.5–15.5)
WBC: 7.9 10*3/uL (ref 4.0–10.5)
nRBC: 0 % (ref 0.0–0.2)

## 2024-11-10 MED ORDER — PANTOPRAZOLE SODIUM 40 MG PO TBEC: 40.0000 mg | DELAYED_RELEASE_TABLET | Freq: Every day | ORAL | Status: DC

## 2024-11-10 MED ORDER — PANTOPRAZOLE SODIUM 40 MG PO TBEC: 40.0000 mg | DELAYED_RELEASE_TABLET | Freq: Every day | ORAL | 2 refills | Status: AC

## 2024-11-10 MED ORDER — PANTOPRAZOLE SODIUM 40 MG PO TBEC
40.0000 mg | DELAYED_RELEASE_TABLET | Freq: Every day | ORAL | 2 refills | Status: DC
  Filled 2024-11-10: qty 60, 60d supply, fill #0

## 2024-11-10 NOTE — TOC Initial Note (Signed)
 Transition of Care [facility]) - Initial/Assessment Note    Patient Details  Name: Andrea Farmer MRN: [id number] Date of Birth: 1941/07/10  Transition of Care [facility]) CM/SW Contact:    Sonda Manuella Quill, RN Phone Number: 11/10/2024, 1:49 PM  Clinical Narrative:                 Notified pt pt needs transportation assist; called pt on room phone; pt said she lives at home w/ her SO Charlena Bloodgood [phone]); she plans to return w/ his support at d/c; insurance/PCP verified; she denied SDOH risks; pt has cane, walker, BSC, and shower chair; she does not have HH services or home oxygen; pt said she would like ambulance for transport home; explained there must be medical need for service, and weather/road conditions have affected their ability to provide service for non-emergent needs; pt said she can pay for her own transportation; she will arrange her own transportation; no IP CM needs.  Expected Discharge Plan: Home/Self Care Barriers to Discharge: No Barriers Identified   Patient Goals and CMS Choice Patient states their goals for this hospitalization and ongoing recovery are:: home          Expected Discharge Plan and Services   Discharge Planning Services: CM Consult   Living arrangements for the past 2 months: Single Family Home Expected Discharge Date: 11/10/24               DME Arranged: N/A DME Agency: NA       HH Arranged: NA HH Agency: NA        Prior Living Arrangements/Services Living arrangements for the past 2 months: Single Family Home Lives with:: Significant Other Patient language and need for interpreter reviewed:: Yes Do you feel safe going back to the place where you live?: Yes      Need for Family Participation in Patient Care: Yes (Comment) Care giver support system in place?: Yes (comment) Current home services: DME (cane, BSC, shower chair) Criminal Activity/Legal Involvement Pertinent to Current Situation/Hospitalization: No - Comment as  needed  Activities of Daily Living   ADL Screening (condition at time of admission) Independently performs ADLs?: Yes (appropriate for developmental age) Is the patient deaf or have difficulty hearing?: No Does the patient have difficulty seeing, even when wearing glasses/contacts?: No Does the patient have difficulty concentrating, remembering, or making decisions?: No  Permission Sought/Granted Permission sought to share information with : Case Manager Permission granted to share information with : Yes, Verbal Permission Granted  Share Information with NAME: Case Manager     Permission granted to share info w Relationship: Charlena Bloodgood (SO) [phone]     Emotional Assessment Appearance:: Other (Comment Required (unable to assess) Attitude/Demeanor/Rapport: Gracious Affect (typically observed): Accepting Orientation: : Oriented to Self, Oriented to Place, Oriented to  Time, Oriented to Situation Alcohol / Substance Use: Not Applicable Psych Involvement: No (comment)  Admission diagnosis:  Pure hypercholesterolemia [E78.00] Mixed hyperlipidemia [E78.2] Acalculous cholecystitis [K81.9] Diabetes mellitus without complication (HCC) [E11.9] Cholecystitis [K81.9] Essential (primary) hypertension [I10] Hyperlipidemia LDL goal <70 [E78.5] Statin intolerance [Z78.9] CAD in native artery [I25.10] Medication management [Z79.899] Coronary artery disease involving native coronary artery of native heart without angina pectoris [I25.10] Patient Active Problem List   Diagnosis Date Noted   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 11/09/2024   RUQ/epigastric pain r/o acalculous cholecystitis 11/09/2024   Hypercalcemia 11/09/2024   MVP (mitral valve prolapse) 01/04/2024   Irregular cardiac rhythm 05/30/2023  Disorder of function of stomach 05/30/2023   Blood glucose elevated 05/30/2023   Allergic rhinitis 05/30/2023   Encounter for long-term (current) use of  medications 05/30/2023   Osteoarthrosis, localized, primary, involving lower leg 05/30/2023   Diabetes mellitus without complication (HCC) 01/13/2022   Pain of left hip joint 01/26/2021   Low back pain 01/26/2021   Abnormal gait 01/26/2021   Closed left hip fracture, initial encounter (HCC) 11/25/2020   Malignant neoplasm of lower-outer quadrant of right breast of female, estrogen receptor positive (HCC) 03/12/2020   Abnormal computed tomography angiography (CTA) 04/08/2016   Abnormal electron beam computed tomography 04/08/2016   Abnormal stress test 04/02/2016   Cardiac ischemia 04/02/2016   Pure hypercholesterolemia 03/11/2016   Abdominal distension (gaseous) 03/11/2016   Abdominal pain, epigastric 03/11/2016   Environmental and seasonal allergies 03/11/2016   Anal or rectal pain 03/11/2016   Dyspepsia 03/11/2016   Other abnormal glucose 03/11/2016   Essential (primary) hypertension 03/11/2016   Cardiac arrhythmia 03/11/2016   Irritable bowel syndrome 03/11/2016   Osteoarthritis of knee 03/11/2016   Osteopenia 03/11/2016   Pain in joint, lower leg 03/11/2016   History of colonic polyps 03/11/2016   Slow transit constipation 03/11/2016   PVC (premature ventricular contraction) 03/11/2016   SOB (shortness of breath) 03/11/2016   Allergy 03/11/2016   Ventricular premature beats 03/11/2016   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/06/2015   History of total knee replacement 05/06/2015   Hemorrhoid 12/07/2010   PCP:  Okey Carlin Redbird, MD Pharmacy:   [id number] [address], [place] - [address] [place] [address] Phone: [phone] Fax: [phone]     Social Drivers of Health (SDOH) Social History: SDOH Screenings   Food Insecurity: No Food Insecurity (11/10/2024)  Housing: Low Risk (11/10/2024)  Transportation Needs: No Transportation Needs (11/10/2024)  Utilities: Not At Risk (11/10/2024)  Social Connections:  Moderately Integrated (11/09/2024)  Tobacco Use: Medium Risk (11/09/2024)   SDOH Interventions: Food Insecurity Interventions: Intervention Not Indicated, Inpatient TOC Housing Interventions: Intervention Not Indicated, Inpatient TOC Transportation Interventions: Intervention Not Indicated, Inpatient TOC Utilities Interventions: Intervention Not Indicated, Inpatient TOC   Readmission Risk Interventions     No data to display

## 2024-11-10 NOTE — Progress Notes (Signed)
 Discharge meds in a secure bag delivered to patient by this RN

## 2024-11-10 NOTE — Care Management Obs Status (Signed)
 MEDICARE OBSERVATION STATUS NOTIFICATION   Patient Details  Name: Andrea Farmer MRN: [id number] Date of Birth: [birth year]   Medicare Observation Status Notification Given:  Yes    Sonda Manuella Quill, RN 11/10/2024, 1:42 PM

## 2024-11-10 NOTE — Progress Notes (Addendum)
" °   ° °  Chief Complaint/Subjective: No new pains, tolerated some food yesterday  Objective: Vital signs in last 24 hours: Temp:  [97.5 F (36.4 C)-98.5 F (36.9 C)] 98 F (36.7 C) (01/31 0606) Pulse Rate:  [58-64] 61 (01/31 0606) Resp:  [16-18] 18 (01/31 0606) BP: (103-159)/(65-91) 119/69 (01/31 0606) SpO2:  [96 %-100 %] 97 % (01/31 0606) Last BM Date : 11/08/24 Intake/Output from previous day: 01/30 0701 - 01/31 0700 In: 691.7 [P.O.:600; IV Piggyback:91.7] Out: -   PE: Gen: NAd Resp: nonlabored Card: RRR Abd: soft, nontender, nondistended  Lab Results:  Recent Labs    11/09/24 0839 11/10/24 0523  WBC 12.1* 7.9  HGB 15.1* 13.0  HCT 45.1 41.2  PLT 324 286   Recent Labs    11/09/24 0839 11/10/24 0523  NA 141 139  K 3.7 3.7  CL 102 103  CO2 26 25  GLUCOSE 142* 99  BUN 13 10  CREATININE 0.61 0.73  CALCIUM  10.4* 9.4   No results for input(s): LABPROT, INR in the last 72 hours.    Component Value Date/Time   NA 139 11/10/2024 0523   NA 139 07/15/2022 0941   K 3.7 11/10/2024 0523   CL 103 11/10/2024 0523   CO2 25 11/10/2024 0523   GLUCOSE 99 11/10/2024 0523   BUN 10 11/10/2024 0523   BUN 13 07/15/2022 0941   CREATININE 0.73 11/10/2024 0523   CREATININE 0.58 (L) 05/20/2016 1127   CALCIUM  9.4 11/10/2024 0523   PROT 7.0 11/10/2024 0523   PROT 6.7 02/21/2020 1140   ALBUMIN 4.1 11/10/2024 0523   ALBUMIN 4.6 02/21/2020 1140   AST 31 11/10/2024 0523   ALT 23 11/10/2024 0523   ALKPHOS 88 11/10/2024 0523   BILITOT 0.8 11/10/2024 0523   BILITOT 0.3 02/21/2020 1140   GFRNONAA >60 11/10/2024 0523   GFRAA >60 03/17/2020 1011    Assessment/Plan 84 yo female with epigastric pain and white count yesterday, no stones, concern for gallbladder thickening. Nontender on exam, tolerating diet, WBC now normal -continue soft diet -I wrote for PPI at discharge -Don't think HIDA is necessary and probably wouldn't get done over the weekend. -will follow up with me in  3 weeks -ok for discharge   LOS: 0 days   I reviewed last 24 h vitals and pain scores, last 48 h intake and output, last 24 h labs and trends, and last 24 h imaging results.  This care required moderate level of medical decision making.   Herlene Righter [facility] 11/10/2024, 8:00 AM Please see Amion for pager number during day hours 7:00am-4:30pm or 7:00am -11:30am on weekends  Reviewed CT scan again, no epigastric/ventral hernia   "

## 2024-11-10 NOTE — Discharge Summary (Signed)
 Physician Discharge Summary  Andrea Farmer FMW:[id number] DOB: 03/30/1941 DOA: 11/09/2024  PCP: Okey Carlin Redbird, MD  Admit date: 11/09/2024 Discharge date: 11/10/2024  Time spent: 26 minutes  Recommendations for Outpatient Follow-up:  Requires outpatient follow-up LFTs CBC with primary care Follow-up with Dr. Patrici as an outpatient for other workup  Discharge Diagnoses:  MAIN problem for hospitalization   Severe reflux DDx cholecystitis  Please see below for itemized issues addressed in HOpsital- refer to other progress notes for clarity if needed  Discharge Condition: Improved  Diet recommendation: Heart healthy  There were no vitals filed for this visit.  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 Burnette   1/30 present from home heartburn abdominal pain after eating lasagna--sodium 141 potassium 3.7 BUN/creatinine 13/0.6 WBC 12.1 UA negative CT abdomen pelvis mildly distended gallbladder intrahepatic extrahepatic dilatation CBD 9 mm mildly distended pancreatic duct 5 mm--Exophytic right renal cyst-procalcitonin less than 0.1--A1c 6.2 General Surgery consulted and felt symptoms more reflux related as LFTs not elevated Patient was observed in the hospital overnight was able to tolerate a full meal the subsequent morning without pain or discomfort and no further symptoms-her abdominal discomfort was noted to be in the central abdomen with radiation upwards and waterbrash more consistent with reflux on my review-she has no right upper quadrant tenderness at all on  exam so I feel it is quite reasonable to give Protonix  treat this as reflux and follow-up as an outpatient She is maximally stabilized and will go home.today   Discharge Exam: Vitals:   11/10/24 0606 11/10/24 0922  BP: 119/69 116/72  Pulse: 61 65  Resp: 18 16  Temp: 98 F (36.7 C) 98.4 F (36.9 C)  SpO2: 97% 94%    Subj on day of d/c   Awake coherent no distress abdominal pain resolved and no fever regular stool  General Exam on discharge  EOMI NCAT no focal deficit icterus pallor Chest clear Abdomen is soft no rebound no guarding no other findings Power 5/5 ROM intact  Discharge Instructions   Discharge Instructions     Discharge instructions   Complete by: As directed    You are diagnosed with severe reflux probably causing some abdominal discomfort-it would be a good idea for you to follow-up closely with Dr. Stevie for one-time follow-up but your symptoms are not classic for gallbladder issues Please take the acid reducer Protonix  regularly, make sure that you avoid spicy or pungent foods and get labs in about a week   Increase activity slowly   Complete by: As directed       Allergies as of 11/10/2024       Reactions   Tramadol  Itching        Medication List     TAKE these medications    acetaminophen  500 MG tablet Commonly known as: TYLENOL  Take 1,000 mg by mouth every 6 (six) hours as needed for mild pain (pain score 1-3) or moderate pain (pain score 4-6) (or headaches).   Align 4 MG Caps Take 4 mg by mouth daily.   amLODipine -benazepril  5-40 MG capsule Commonly known as: LOTREL Take  1 capsule by mouth daily.   aspirin  EC 81 MG tablet Take 1 tablet (81 mg total) by mouth every other day. Swallow whole. What changed:  when to take this additional instructions   Creon 36000-114000 units Cpep capsule Generic drug: lipase/protease/amylase Take 36,000-72,000 Units by mouth See admin instructions. Take 72,000 units by mouth three times a day  before meals and 36,000 units before each snack   dapagliflozin  propanediol 10 MG Tabs tablet Commonly known as: Farxiga  Take 1 tablet (10 mg total) by mouth daily before breakfast.   ezetimibe  10 MG tablet Commonly known as: ZETIA  Take 1 tablet (10 mg total) by mouth daily.   letrozole  2.5 MG tablet Commonly known as: FEMARA  Take 1 tablet (2.5 mg total) by mouth daily.   metoprolol  succinate 25 MG 24 hr tablet Commonly known as: TOPROL -XL Take 25 mg by mouth daily.   OneTouch Ultra test strip Generic drug: glucose blood daily. for testing blood sugar   OneTouch UltraSoft 2 Lancets Misc daily. use as directed   oxybutynin 10 MG 24 hr tablet Commonly known as: DITROPAN-XL Take 10 mg by mouth in the morning.   pantoprazole  40 MG tablet Commonly known as: PROTONIX  Take 1 tablet (40 mg total) by mouth daily.   polyethylene glycol 17 g packet Commonly known as: MIRALAX  / GLYCOLAX  Take 17 g by mouth daily.   potassium chloride  SA 20 MEQ tablet Commonly known as: KLOR-CON  M Take 1 tablet (20 mEq total) by mouth daily.   rosuvastatin  20 MG tablet Commonly known as: CRESTOR  Take 1 tablet (20 mg total) by mouth daily.   VICKS VAPOR INHALER IN Place 1 Inhalation into both nostrils as needed (for nasal congestion).       Allergies[1]  Follow-up Information     Kinsinger, Herlene Righter, MD Follow up in 3 week(s).   Specialty: General Surgery Contact information: [address] [place] [address] [phone]                  The results of significant diagnostics from this hospitalization (including imaging, microbiology, ancillary and laboratory) are listed below for reference.    Significant Diagnostic Studies: MR ABDOMEN MRCP W WO CONTRAST Result Date: 11/09/2024 EXAM: MRI Abdomen Without then with Contrast 11/09/2024 02:32:25 PM TECHNIQUE: Multiplanar multisequence MRI of the abdomen was performed without and with the administration of  intravenous contrast. 8 mL (gadobutrol  (GADAVIST ) 1 MMOL/ML injection 8 mL GADOBUTROL  1 MMOL/ML IV SOLN) was administered. COMPARISON: CT abdomen 11/09/2024. CLINICAL HISTORY: Distended gallbladder and biliary dilatation. FINDINGS: LIVER: Borderline intrahepatic biliary dilatation. GALLBLADDER AND BILIARY SYSTEM: Gallbladder wall thickening is observed potentially with trace pericholecystic fluid. Motion artifact is present, reducing diagnostic sensitivity and specificity. Artifact. Bile duct 8 mm in diameter, upper normal size for age and similar to the 05/30/2023 exam. No obvious filling defect is identified in the common bile duct, although assessment is limited by motion artifact. Slightly blunted distal margin of the common bile duct observed in the vicinity of the ampulla, with no abnormal enhancing mass in this vicinity to suggest pancreatic head or ampullary mass. Situated arterial phase enhancement along the gallbladder fossa, which can sometimes be associated with cholecystitis. No well defined gallstones are seen. SPLEEN: Unremarkable. PANCREAS: Dorsal pancreatic duct 4 mm, mildly dilated, but primarily in the pancreatic head, with no duct dilatation in the pancreatic body or tail. ADRENAL GLANDS: Unremarkable. KIDNEYS: Bosniak category 2 cyst of the right kidney upper pole measuring 1.6 x 2.0 cm  with high precontrast T1 signal characteristics and no enhancement. No further imaging workup of this lesion is indicated. LYMPH NODES: No lymphadenopathy. VASCULATURE: Unremarkable. PERITONEUM: No ascites. BOWEL: Grossly unremarkable. ABDOMINAL WALL: No acute abnormality. BONES: Levoconvex thoracolumbar scoliosis with rotary component. Lumbar spondylosis. LUNGS: Atelectasis or scarring in the posterior basal segment left lower lobe. IMPRESSION: 1. Distended gallbladder with wall thickening, trace pericholecystic fluid, and arterial phase enhancement along the gallbladder fossa, which can be associated with  cholecystitis, without a well-defined gallstone. 2. Borderline intrahepatic biliary dilatation with common bile duct measuring 8 mm (upper normal for age) and no obvious common bile duct filling defect, with slightly blunted distal common bile duct margin. 3. Mild pancreatic duct dilatation up to 0.4 cm in the pancreatic head , although normal caliber in the pancreatic body and tail. No underlying pancreatic lesion observed. 4. Atelectasis or scarring in the posterior basal segment of the left lower lobe. 5. Levoconvex thoracolumbar scoliosis with a rotary component. 6. Lumbar spondylosis. Electronically signed by: Ryan Salvage MD 11/09/2024 02:56 PM EST RP Workstation: [id number]   MR 3D Recon At Scanner Result Date: 11/09/2024 EXAM: MRI Abdomen Without then with Contrast 11/09/2024 02:32:25 PM TECHNIQUE: Multiplanar multisequence MRI of the abdomen was performed without and with the administration of intravenous contrast. 8 mL (gadobutrol  (GADAVIST ) 1 MMOL/ML injection 8 mL GADOBUTROL  1 MMOL/ML IV SOLN) was administered. COMPARISON: CT abdomen 11/09/2024. CLINICAL HISTORY: Distended gallbladder and biliary dilatation. FINDINGS: LIVER: Borderline intrahepatic biliary dilatation. GALLBLADDER AND BILIARY SYSTEM: Gallbladder wall thickening is observed potentially with trace pericholecystic fluid. Motion artifact is present, reducing diagnostic sensitivity and specificity. Artifact. Bile duct 8 mm in diameter, upper normal size for age and similar to the 05/30/2023 exam. No obvious filling defect is identified in the common bile duct, although assessment is limited by motion artifact. Slightly blunted distal margin of the common bile duct observed in the vicinity of the ampulla, with no abnormal enhancing mass in this vicinity to suggest pancreatic head or ampullary mass. Situated arterial phase enhancement along the gallbladder fossa, which can sometimes be associated with cholecystitis. No well defined  gallstones are seen. SPLEEN: Unremarkable. PANCREAS: Dorsal pancreatic duct 4 mm, mildly dilated, but primarily in the pancreatic head, with no duct dilatation in the pancreatic body or tail. ADRENAL GLANDS: Unremarkable. KIDNEYS: Bosniak category 2 cyst of the right kidney upper pole measuring 1.6 x 2.0 cm with high precontrast T1 signal characteristics and no enhancement. No further imaging workup of this lesion is indicated. LYMPH NODES: No lymphadenopathy. VASCULATURE: Unremarkable. PERITONEUM: No ascites. BOWEL: Grossly unremarkable. ABDOMINAL WALL: No acute abnormality. BONES: Levoconvex thoracolumbar scoliosis with rotary component. Lumbar spondylosis. LUNGS: Atelectasis or scarring in the posterior basal segment left lower lobe. IMPRESSION: 1. Distended gallbladder with wall thickening, trace pericholecystic fluid, and arterial phase enhancement along the gallbladder fossa, which can be associated with cholecystitis, without a well-defined gallstone. 2. Borderline intrahepatic biliary dilatation with common bile duct measuring 8 mm (upper normal for age) and no obvious common bile duct filling defect, with slightly blunted distal common bile duct margin. 3. Mild pancreatic duct dilatation up to 0.4 cm in the pancreatic head , although normal caliber in the pancreatic body and tail. No underlying pancreatic lesion observed. 4. Atelectasis or scarring in the posterior basal segment of the left lower lobe. 5. Levoconvex thoracolumbar scoliosis with a rotary component. 6. Lumbar spondylosis. Electronically signed by: Ryan Salvage MD 11/09/2024 02:56 PM EST RP Workstation: [id number]   CT ABDOMEN PELVIS W CONTRAST Result  Date: 11/09/2024 EXAM: CT ABDOMEN AND PELVIS WITH CONTRAST 11/09/2024 10:55:09 AM TECHNIQUE: CT of the abdomen and pelvis was performed with the administration of 100 mL (iohexol  (OMNIPAQUE ) 300 MG/ML solution 100 mL IOHEXOL  300 MG/ML SOLN) intravenous contrast. Multiplanar reformatted  images are provided for review. Automated exposure control, iterative reconstruction, and/or weight-based adjustment of the mA/kV was utilized to reduce the radiation dose to as low as reasonably achievable. COMPARISON: CT of the abdomen and pelvis dated 05/30/2023. CLINICAL HISTORY: Abdominal pain, acute, nonlocalized. Acute, nonlocalized abdominal pain. FINDINGS: LOWER CHEST: There is mild atelectasis present independently within the lower lobes bilaterally. LIVER: The liver is unremarkable. GALLBLADDER AND BILE DUCTS: The gallbladder is mildly distended, measuring up to 4.6 cm in diameter. There is mild intrahepatic and extrahepatic biliary ductal dilatation. The common bile duct measures up to approximately 9 mm in diameter. There are no obstructing lesions evident. SPLEEN: The spleen is unremarkable. PANCREAS: The main pancreatic duct is mildly distended within the head of the pancreas, measuring up to 5 mm in diameter. The pancreas is otherwise unremarkable. ADRENAL GLANDS: The adrenal glands are unremarkable. KIDNEYS, URETERS AND BLADDER: There is a simple partially exophytic cyst arising laterally from the superior pole of the right kidney measuring approximately 18 mm in diameter. Per consensus, no follow-up is needed for simple Bosniak type 1 and 2 renal cysts, unless the patient has a malignancy history or risk factors. No stones in the kidneys or ureters. No hydronephrosis. No perinephric or periureteral stranding. Urinary bladder is unremarkable. GI AND BOWEL: Stomach demonstrates no acute abnormality. There is no bowel obstruction. PERITONEUM AND RETROPERITONEUM: No ascites. No free air. VASCULATURE: The abdominal aorta is normal in caliber and demonstrates moderate calcific atheromatous disease. LYMPH NODES: No lymphadenopathy. REPRODUCTIVE ORGANS: The patient is status post hysterectomy and bilateral salpingo-oophorectomy. BONES AND SOFT TISSUES: Orthopedic fixation of the left proximal femur. No  acute osseous abnormality. No focal soft tissue abnormality. IMPRESSION: 1. Mildly distended gallbladder and mild intrahepatic and extrahepatic biliary ductal dilatation with the common bile duct measuring up to approximately 9 mm in diameter, without an obstructing lesion evident. Differential considerations include choledocholithiasis (including occult distal CBD stone), benign distal biliary stricture/papillary stenosis or sphincter of Oddi dysfunction, and less likely ampullary or pancreatic head malignancy; recommend correlation with bilirubin/LFTs and consideration of MRCP (or ERCP/EUS as clinically indicated). 2. Mildly distended main pancreatic duct within the head of the pancreas, measuring up to 5 mm in diameter; the pancreas is otherwise unremarkable. Differential considerations include physiologic/age-related duct prominence, occult ampullary lesion or pancreatitis, and less likely early pancreatic neoplasm; recommend correlation with clinical/laboratory findings and consideration of MRCP/EUS if symptoms or laboratory abnormalities persist. 3. Simple partially exophytic right renal cyst measuring approximately 1.8 cm (Bosniak I/II); no follow-up imaging recommended. Electronically signed by: Evalene Coho MD 11/09/2024 11:16 AM EST RP Workstation: [id number]    Microbiology: No results found for this or any previous visit (from the past 240 hours).   Labs: Basic Metabolic Panel: Recent Labs  Lab 11/09/24 0839 11/10/24 0523  NA 141 139  K 3.7 3.7  CL 102 103  CO2 26 25  GLUCOSE 142* 99  BUN 13 10  CREATININE 0.61 0.73  CALCIUM  10.4* 9.4   Liver Function Tests: Recent Labs  Lab 11/09/24 0839 11/10/24 0523  AST 24 31  ALT 11 23  ALKPHOS 107 88  BILITOT 0.4 0.8  PROT 8.4* 7.0  ALBUMIN 4.8 4.1   Recent Labs  Lab 11/09/24 0839  LIPASE  45   No results for input(s): AMMONIA in the last 168 hours. CBC: Recent Labs  Lab 11/09/24 0839 11/10/24 0523  WBC 12.1* 7.9   NEUTROABS 10.0*  --   HGB 15.1* 13.0  HCT 45.1 41.2  MCV 91.7 95.6  PLT 324 286   Cardiac Enzymes: No results for input(s): CKTOTAL, CKMB, CKMBINDEX, TROPONINI in the last 168 hours. BNP: BNP (last 3 results) No results for input(s): BNP in the last 8760 hours.  ProBNP (last 3 results) No results for input(s): PROBNP in the last 8760 hours.  CBG: Recent Labs  Lab 11/09/24 2021 11/10/24 0714  GLUCAP 123* 111*    Signed:  Colen Grimes MD   [facility] 11/10/2024, 10:32 AM      [1]  Allergies Allergen Reactions   Tramadol  Itching

## 2024-11-10 NOTE — Progress Notes (Signed)
 Discharge instructions discussed with patient, verbalized agreement and understanding

## 2025-02-04 ENCOUNTER — Ambulatory Visit (HOSPITAL_BASED_OUTPATIENT_CLINIC_OR_DEPARTMENT_OTHER)

## 2025-03-25 ENCOUNTER — Ambulatory Visit

## 2025-05-21 ENCOUNTER — Ambulatory Visit: Admitting: Hematology and Oncology
